# Patient Record
Sex: Male | Born: 1955 | Race: White | Hispanic: No | Marital: Single | Smoking: Former smoker
Health system: Southern US, Community
[De-identification: ages and names within clinical notes are randomized; demographics above are authoritative.]

## PROBLEM LIST (undated history)

## (undated) DIAGNOSIS — I2510 Atherosclerotic heart disease of native coronary artery without angina pectoris: Secondary | ICD-10-CM

## (undated) DIAGNOSIS — J449 Chronic obstructive pulmonary disease, unspecified: Secondary | ICD-10-CM

## (undated) DIAGNOSIS — I472 Ventricular tachycardia: Secondary | ICD-10-CM

## (undated) DIAGNOSIS — Z7901 Long term (current) use of anticoagulants: Secondary | ICD-10-CM

## (undated) DIAGNOSIS — E669 Obesity, unspecified: Secondary | ICD-10-CM

## (undated) DIAGNOSIS — I5022 Chronic systolic (congestive) heart failure: Secondary | ICD-10-CM

## (undated) DIAGNOSIS — K621 Rectal polyp: Secondary | ICD-10-CM

## (undated) DIAGNOSIS — I10 Essential (primary) hypertension: Secondary | ICD-10-CM

## (undated) DIAGNOSIS — I249 Acute ischemic heart disease, unspecified: Secondary | ICD-10-CM

## (undated) DIAGNOSIS — E785 Hyperlipidemia, unspecified: Secondary | ICD-10-CM

## (undated) DIAGNOSIS — R0602 Shortness of breath: Secondary | ICD-10-CM

## (undated) DIAGNOSIS — I4729 Other ventricular tachycardia: Secondary | ICD-10-CM

## (undated) DIAGNOSIS — K635 Polyp of colon: Secondary | ICD-10-CM

## (undated) DIAGNOSIS — K921 Melena: Secondary | ICD-10-CM

## (undated) DIAGNOSIS — M1990 Unspecified osteoarthritis, unspecified site: Secondary | ICD-10-CM

## (undated) DIAGNOSIS — I4891 Unspecified atrial fibrillation: Secondary | ICD-10-CM

## (undated) DIAGNOSIS — I499 Cardiac arrhythmia, unspecified: Secondary | ICD-10-CM

## (undated) HISTORY — DX: Rectal polyp: K62.1

## (undated) HISTORY — DX: Essential (primary) hypertension: I10

## (undated) HISTORY — DX: Other ventricular tachycardia: I47.29

## (undated) HISTORY — PX: FOOT SURGERY: SHX648

## (undated) HISTORY — DX: Acute ischemic heart disease, unspecified: I24.9

## (undated) HISTORY — DX: Atherosclerotic heart disease of native coronary artery without angina pectoris: I25.10

## (undated) HISTORY — DX: Obesity, unspecified: E66.9

## (undated) HISTORY — DX: Melena: K92.1

## (undated) HISTORY — DX: Unspecified atrial fibrillation: I48.91

## (undated) HISTORY — DX: Long term (current) use of anticoagulants: Z79.01

## (undated) HISTORY — DX: Polyp of colon: K63.5

## (undated) HISTORY — DX: Hyperlipidemia, unspecified: E78.5

## (undated) HISTORY — DX: Ventricular tachycardia: I47.2

---

## 2002-02-23 ENCOUNTER — Emergency Department (HOSPITAL_COMMUNITY): Admission: EM | Admit: 2002-02-23 | Discharge: 2002-02-23 | Payer: Self-pay | Admitting: Emergency Medicine

## 2002-02-23 ENCOUNTER — Encounter: Payer: Self-pay | Admitting: Emergency Medicine

## 2002-08-14 ENCOUNTER — Ambulatory Visit (HOSPITAL_BASED_OUTPATIENT_CLINIC_OR_DEPARTMENT_OTHER): Admission: RE | Admit: 2002-08-14 | Discharge: 2002-08-14 | Payer: Self-pay | Admitting: Orthopedic Surgery

## 2002-11-14 ENCOUNTER — Encounter: Admission: RE | Admit: 2002-11-14 | Discharge: 2003-02-12 | Payer: Self-pay | Admitting: Orthopedic Surgery

## 2003-02-11 ENCOUNTER — Ambulatory Visit (HOSPITAL_COMMUNITY): Admission: RE | Admit: 2003-02-11 | Discharge: 2003-02-11 | Payer: Self-pay | Admitting: Internal Medicine

## 2003-12-11 ENCOUNTER — Ambulatory Visit: Payer: Self-pay | Admitting: Nurse Practitioner

## 2004-02-06 ENCOUNTER — Encounter: Admission: RE | Admit: 2004-02-06 | Discharge: 2004-05-06 | Payer: Self-pay | Admitting: Internal Medicine

## 2004-05-07 ENCOUNTER — Encounter: Admission: RE | Admit: 2004-05-07 | Discharge: 2004-08-05 | Payer: Self-pay | Admitting: Internal Medicine

## 2004-12-03 ENCOUNTER — Ambulatory Visit: Payer: Self-pay | Admitting: Nurse Practitioner

## 2005-07-16 ENCOUNTER — Ambulatory Visit: Payer: Self-pay | Admitting: Nurse Practitioner

## 2005-10-25 ENCOUNTER — Ambulatory Visit: Payer: Self-pay | Admitting: Nurse Practitioner

## 2007-03-16 ENCOUNTER — Emergency Department (HOSPITAL_COMMUNITY): Admission: EM | Admit: 2007-03-16 | Discharge: 2007-03-16 | Payer: Self-pay | Admitting: Emergency Medicine

## 2007-03-21 ENCOUNTER — Ambulatory Visit: Payer: Self-pay | Admitting: Family Medicine

## 2007-03-21 ENCOUNTER — Encounter (INDEPENDENT_AMBULATORY_CARE_PROVIDER_SITE_OTHER): Payer: Self-pay | Admitting: Nurse Practitioner

## 2007-03-21 LAB — CONVERTED CEMR LAB
ALT: 30 units/L (ref 0–53)
AST: 20 units/L (ref 0–37)
Albumin: 4.4 g/dL (ref 3.5–5.2)
Basophils Absolute: 0 10*3/uL (ref 0.0–0.1)
Basophils Relative: 0 % (ref 0–1)
CO2: 22 meq/L (ref 19–32)
Chloride: 99 meq/L (ref 96–112)
Eosinophils Relative: 2 % (ref 0–5)
Hemoglobin: 16.2 g/dL (ref 13.0–17.0)
Lymphocytes Relative: 31 % (ref 12–46)
MCHC: 32.9 g/dL (ref 30.0–36.0)
MCV: 94.8 fL (ref 78.0–100.0)
Monocytes Absolute: 0.7 10*3/uL (ref 0.1–1.0)
Neutrophils Relative %: 57 % (ref 43–77)
PSA: 1.52 ng/mL (ref 0.10–4.00)
Platelets: 214 10*3/uL (ref 150–400)
TSH: 3.993 microintl units/mL (ref 0.350–5.50)
Total Bilirubin: 0.6 mg/dL (ref 0.3–1.2)
Total Protein: 7.4 g/dL (ref 6.0–8.3)
WBC: 6.7 10*3/uL (ref 4.0–10.5)

## 2007-04-19 ENCOUNTER — Ambulatory Visit: Payer: Self-pay | Admitting: Nurse Practitioner

## 2007-04-19 LAB — CONVERTED CEMR LAB
HDL: 47 mg/dL (ref >39)
LDL Cholesterol: 158 mg/dL — ABNORMAL HIGH (ref 0–99)
Total CHOL/HDL Ratio: 5.2

## 2007-07-12 ENCOUNTER — Ambulatory Visit (HOSPITAL_COMMUNITY): Admission: RE | Admit: 2007-07-12 | Discharge: 2007-07-13 | Payer: Self-pay | Admitting: Orthopedic Surgery

## 2007-08-16 ENCOUNTER — Encounter: Admission: RE | Admit: 2007-08-16 | Discharge: 2007-11-14 | Payer: Self-pay | Admitting: Orthopedic Surgery

## 2007-11-16 ENCOUNTER — Encounter: Admission: RE | Admit: 2007-11-16 | Discharge: 2007-12-12 | Payer: Self-pay | Admitting: Orthopedic Surgery

## 2008-01-26 HISTORY — PX: CORONARY ANGIOPLASTY WITH STENT PLACEMENT: SHX49

## 2008-09-18 ENCOUNTER — Encounter: Admission: RE | Admit: 2008-09-18 | Discharge: 2008-09-18 | Payer: Self-pay | Admitting: Orthopedic Surgery

## 2008-11-01 ENCOUNTER — Ambulatory Visit: Payer: Self-pay | Admitting: Internal Medicine

## 2008-11-01 ENCOUNTER — Inpatient Hospital Stay (HOSPITAL_COMMUNITY): Admission: EM | Admit: 2008-11-01 | Discharge: 2008-11-14 | Payer: Self-pay | Admitting: Emergency Medicine

## 2008-11-02 ENCOUNTER — Encounter: Payer: Self-pay | Admitting: Internal Medicine

## 2008-11-04 ENCOUNTER — Encounter (INDEPENDENT_AMBULATORY_CARE_PROVIDER_SITE_OTHER): Payer: Self-pay | Admitting: Internal Medicine

## 2008-11-05 ENCOUNTER — Encounter (INDEPENDENT_AMBULATORY_CARE_PROVIDER_SITE_OTHER): Payer: Self-pay | Admitting: Internal Medicine

## 2008-11-08 ENCOUNTER — Encounter: Payer: Self-pay | Admitting: Cardiology

## 2009-04-10 ENCOUNTER — Encounter: Admission: RE | Admit: 2009-04-10 | Discharge: 2009-04-10 | Payer: Self-pay | Admitting: Orthopedic Surgery

## 2010-04-30 LAB — BASIC METABOLIC PANEL
BUN: 18 mg/dL (ref 6–23)
BUN: 19 mg/dL (ref 6–23)
BUN: 19 mg/dL (ref 6–23)
BUN: 21 mg/dL (ref 6–23)
BUN: 10 mg/dL (ref 6–23)
BUN: 8 mg/dL (ref 6–23)
BUN: 14 mg/dL (ref 6–23)
BUN: 17 mg/dL (ref 6–23)
CO2: 26 mEq/L (ref 19–32)
CO2: 30 mEq/L (ref 19–32)
CO2: 34 mEq/L — ABNORMAL HIGH (ref 19–32)
CO2: 34 mEq/L — ABNORMAL HIGH (ref 19–32)
CO2: 25 mEq/L (ref 19–32)
Calcium: 8.9 mg/dL (ref 8.4–10.5)
Calcium: 9.2 mg/dL (ref 8.4–10.5)
Calcium: 9.7 mg/dL (ref 8.4–10.5)
Calcium: 9.8 mg/dL (ref 8.4–10.5)
Calcium: 8.9 mg/dL (ref 8.4–10.5)
Calcium: 9.7 mg/dL (ref 8.4–10.5)
Chloride: 101 mEq/L (ref 96–112)
Chloride: 104 mEq/L (ref 96–112)
Chloride: 104 mEq/L (ref 96–112)
Chloride: 100 mEq/L (ref 96–112)
Chloride: 104 mEq/L (ref 96–112)
Creatinine, Ser: 1.08 mg/dL (ref 0.4–1.5)
Creatinine, Ser: 1.21 mg/dL (ref 0.4–1.5)
Creatinine, Ser: 1.35 mg/dL (ref 0.4–1.5)
Creatinine, Ser: 1.09 mg/dL (ref 0.4–1.5)
Creatinine, Ser: 1.1 mg/dL (ref 0.4–1.5)
GFR calc Af Amer: >60 mL/min (ref >60)
GFR calc Af Amer: >60 mL/min (ref >60)
GFR calc Af Amer: >60 mL/min (ref >60)
GFR calc non Af Amer: >60 mL/min (ref >60)
GFR calc non Af Amer: >60 mL/min (ref >60)
GFR calc non Af Amer: >60 mL/min (ref >60)
GFR calc non Af Amer: >60 mL/min (ref >60)
GFR calc non Af Amer: >60 mL/min (ref >60)
Glucose, Bld: 93 mg/dL (ref 70–99)
Glucose, Bld: 98 mg/dL (ref 70–99)
Glucose, Bld: 99 mg/dL (ref 70–99)
Glucose, Bld: 150 mg/dL — ABNORMAL HIGH (ref 70–99)
Glucose, Bld: 106 mg/dL — ABNORMAL HIGH (ref 70–99)
Glucose, Bld: 122 mg/dL — ABNORMAL HIGH (ref 70–99)
Potassium: 4 mEq/L (ref 3.5–5.1)
Potassium: 4.3 mEq/L (ref 3.5–5.1)
Potassium: 3.8 mEq/L (ref 3.5–5.1)
Potassium: 3.7 mEq/L (ref 3.5–5.1)
Potassium: 4.2 mEq/L (ref 3.5–5.1)
Potassium: 4.2 mEq/L (ref 3.5–5.1)
Sodium: 138 mEq/L (ref 135–145)
Sodium: 140 mEq/L (ref 135–145)

## 2010-04-30 LAB — DIFFERENTIAL
Basophils Absolute: 0 10*3/uL (ref 0.0–0.1)
Basophils Relative: 1 % (ref 0–1)
Eosinophils Absolute: 0 K/uL (ref 0.0–0.7)
Eosinophils Relative: 0 % (ref 0–5)
Lymphocytes Relative: 19 % (ref 12–46)
Lymphs Abs: 1.9 K/uL (ref 0.7–4.0)
Monocytes Absolute: 0.8 K/uL (ref 0.1–1.0)
Monocytes Relative: 8 % (ref 3–12)
Neutro Abs: 7.5 10*3/uL (ref 1.7–7.7)
Neutrophils Relative %: 73 % (ref 43–77)

## 2010-04-30 LAB — D-DIMER, QUANTITATIVE: D-Dimer, Quant: 1.32 ug{FEU}/mL — ABNORMAL HIGH (ref 0.00–0.48)

## 2010-04-30 LAB — POCT CARDIAC MARKERS: CKMB, poc: 2.9 ng/mL (ref 1.0–8.0)

## 2010-04-30 LAB — CBC
HCT: 50.4 % (ref 39.0–52.0)
HCT: 50.9 % (ref 39.0–52.0)
HCT: 51 % (ref 39.0–52.0)
HCT: 48.6 % (ref 39.0–52.0)
HCT: 50 % (ref 39.0–52.0)
HCT: 50.2 % (ref 39.0–52.0)
HCT: 44.8 % (ref 39.0–52.0)
HCT: 50.3 % (ref 39.0–52.0)
HCT: 51.9 % (ref 39.0–52.0)
Hemoglobin: 16.7 g/dL (ref 13.0–17.0)
MCHC: 33.3 g/dL (ref 30.0–36.0)
MCHC: 33.8 g/dL (ref 30.0–36.0)
MCHC: 34 g/dL (ref 30.0–36.0)
MCV: 94 fL (ref 78.0–100.0)
MCV: 95.4 fL (ref 78.0–100.0)
MCV: 95.8 fL (ref 78.0–100.0)
MCV: 94.2 fL (ref 78.0–100.0)
MCV: 94.6 fL (ref 78.0–100.0)
MCV: 95.1 fL (ref 78.0–100.0)
MCV: 96.2 fL (ref 78.0–100.0)
MCV: 95.6 fL (ref 78.0–100.0)
MCV: 95.7 fL (ref 78.0–100.0)
Platelets: 227 10*3/uL (ref 150–400)
Platelets: 250 10*3/uL (ref 150–400)
Platelets: 181 10*3/uL (ref 150–400)
Platelets: 189 10*3/uL (ref 150–400)
Platelets: 196 10*3/uL (ref 150–400)
Platelets: 227 10*3/uL (ref 150–400)
Platelets: 205 10*3/uL (ref 150–400)
Platelets: 219 10*3/uL (ref 150–400)
RBC: 5.29 MIL/uL (ref 4.22–5.81)
RBC: 5.32 MIL/uL (ref 4.22–5.81)
RBC: 5.35 MIL/uL (ref 4.22–5.81)
RBC: 5.14 MIL/uL (ref 4.22–5.81)
RBC: 5.21 MIL/uL (ref 4.22–5.81)
RBC: 4.69 MIL/uL (ref 4.22–5.81)
RDW: 13.4 % (ref 11.5–15.5)
RDW: 14.5 % (ref 11.5–15.5)
RDW: 14.1 % (ref 11.5–15.5)
RDW: 14.3 % (ref 11.5–15.5)
WBC: 9.1 10*3/uL (ref 4.0–10.5)
WBC: 10.2 10*3/uL (ref 4.0–10.5)
WBC: 8.1 10*3/uL (ref 4.0–10.5)
WBC: 9.7 10*3/uL (ref 4.0–10.5)
WBC: 7.5 10*3/uL (ref 4.0–10.5)

## 2010-04-30 LAB — URINE MICROSCOPIC-ADD ON

## 2010-04-30 LAB — BRAIN NATRIURETIC PEPTIDE
Pro B Natriuretic peptide (BNP): 192 pg/mL — ABNORMAL HIGH (ref 0.0–100.0)
Pro B Natriuretic peptide (BNP): 147 pg/mL — ABNORMAL HIGH (ref 0.0–100.0)
Pro B Natriuretic peptide (BNP): 328 pg/mL — ABNORMAL HIGH (ref 0.0–100.0)

## 2010-04-30 LAB — HEPARIN LEVEL (UNFRACTIONATED)
Heparin Unfractionated: 0.61 IU/mL (ref 0.30–0.70)
Heparin Unfractionated: 0.95 IU/mL — ABNORMAL HIGH (ref 0.30–0.70)
Heparin Unfractionated: <0.1 IU/mL — ABNORMAL LOW (ref 0.30–0.70)

## 2010-04-30 LAB — COMPREHENSIVE METABOLIC PANEL
ALT: 31 U/L (ref 0–53)
AST: 24 U/L (ref 0–37)
Albumin: 3.7 g/dL (ref 3.5–5.2)
Alkaline Phosphatase: 62 U/L (ref 39–117)
CO2: 24 mEq/L (ref 19–32)
Chloride: 104 mEq/L (ref 96–112)
Creatinine, Ser: 1.11 mg/dL (ref 0.4–1.5)
Total Bilirubin: 1.7 mg/dL — ABNORMAL HIGH (ref 0.3–1.2)

## 2010-04-30 LAB — URINALYSIS, ROUTINE W REFLEX MICROSCOPIC
Bilirubin Urine: NEGATIVE
Glucose, UA: 250 mg/dL — AB
Ketones, ur: NEGATIVE mg/dL
Nitrite: NEGATIVE
Protein, ur: 100 mg/dL — AB
Specific Gravity, Urine: 1.016 (ref 1.005–1.030)
Urobilinogen, UA: 0.2 mg/dL (ref 0.0–1.0)
pH: 7.5 (ref 5.0–8.0)

## 2010-04-30 LAB — CARDIAC PANEL(CRET KIN+CKTOT+MB+TROPI)
CK, MB: 3.6 ng/mL (ref 0.3–4.0)
Relative Index: INVALID (ref 0.0–2.5)
Relative Index: INVALID (ref 0.0–2.5)
Total CK: 44 U/L (ref 7–232)
Troponin I: 0.04 ng/mL (ref 0.00–0.06)
Troponin I: 0.04 ng/mL (ref 0.00–0.06)

## 2010-04-30 LAB — SEDIMENTATION RATE: Sed Rate: 0 mm/hr (ref 0–16)

## 2010-04-30 LAB — PROTIME-INR
INR: 1.73 — ABNORMAL HIGH (ref 0.00–1.49)
INR: 1.92 — ABNORMAL HIGH (ref 0.00–1.49)
INR: 1.19 (ref 0.00–1.49)
Prothrombin Time: 15.5 seconds — ABNORMAL HIGH (ref 11.6–15.2)
Prothrombin Time: 20.1 seconds — ABNORMAL HIGH (ref 11.6–15.2)
Prothrombin Time: 15 seconds (ref 11.6–15.2)

## 2010-04-30 LAB — POCT I-STAT 3, VENOUS BLOOD GAS (G3P V)
O2 Saturation: 61 %
TCO2: 24 mmol/L (ref 0–100)
pH, Ven: 7.318 — ABNORMAL HIGH (ref 7.250–7.300)

## 2010-04-30 LAB — COMPREHENSIVE METABOLIC PANEL WITH GFR
BUN: 10 mg/dL (ref 6–23)
Calcium: 9.1 mg/dL (ref 8.4–10.5)
GFR calc Af Amer: >60 mL/min (ref >60)
GFR calc non Af Amer: >60 mL/min (ref >60)
Glucose, Bld: 113 mg/dL — ABNORMAL HIGH (ref 70–99)
Potassium: 4.3 meq/L (ref 3.5–5.1)
Sodium: 137 meq/L (ref 135–145)
Total Protein: 6.5 g/dL (ref 6.0–8.3)

## 2010-04-30 LAB — POCT I-STAT 3, ART BLOOD GAS (G3+)
Bicarbonate: 25.9 mEq/L — ABNORMAL HIGH (ref 20.0–24.0)
pH, Arterial: 7.386 (ref 7.350–7.450)

## 2010-04-30 LAB — LIPID PANEL
Cholesterol: 146 mg/dL (ref 0–200)
LDL Cholesterol: 96 mg/dL (ref 0–99)

## 2010-04-30 LAB — CEA: CEA: 1.1 ng/mL (ref 0.0–5.0)

## 2010-04-30 LAB — APTT: aPTT: 30 seconds (ref 24–37)

## 2010-04-30 LAB — HEMOGLOBIN A1C: Hgb A1c MFr Bld: 5.5 % (ref 4.6–6.1)

## 2010-04-30 LAB — CK TOTAL AND CKMB (NOT AT ARMC): Relative Index: INVALID (ref 0.0–2.5)

## 2010-06-09 NOTE — Op Note (Signed)
NAME:  Norman Rodriguez, Norman Rodriguez             ACCOUNT NO.:  [id number]   MEDICAL RECORD NO.:  [id number]          PATIENT TYPE:  AMB   LOCATION:  DAY                          FACILITY:  [facility]   PHYSICIAN:  Georges Lynch. Gioffre, M.D.DATE OF BIRTH:  04-Oct-1955   DATE OF PROCEDURE:  07/12/2007  DATE OF DISCHARGE:                               OPERATIVE REPORT   SURGEON:  Georges Lynch. Darrelyn Hillock, M.D.   ASSISTANT:  Jamelle Rushing, P.A.   PREOPERATIVE DIAGNOSES:  1. Severe impingement syndrome left shoulder.  2. Partial tear of the rotator cuff tendon of the left shoulder.   POSTOPERATIVE DIAGNOSES:  1. Severe impingement syndrome left shoulder.  2. Partial tear of the rotator cuff tendon of the left shoulder.   OPERATIONS:  1. Open acromionectomy and acromioplasty left shoulder.  2. Repair of tear of the rotator cuff tendon utilizing a Restore      graft, with a PEEK anchor with four separate sutures.   PROCEDURE:  Under general anesthesia, routine orthopedic prep and  draping of the left shoulder was carried out.  The patient was not given  an interscalene nerve block because of numbness in his fingers preop.  At this time, an incision was made over the anterior aspect of the left  shoulder.  Bleeders were identified and cauterized.  I stripped the  deltoid tendon by sharp dissection from the acromion.  I then separated  the proximal portion of the deltoid muscle.  Immediately upon going down  into the shoulder, I removed a very thickened, irritated subdeltoid  bursa.  I also noted at that time there was literally no room for  shoulder abduction because of the severe downsloping and thickening of  his acromion.  I then protected the subacromial space with a Cytogeneticist.  I utilized the oscillating saw on the bur to even out the  undersurface of the acromion.  Once we reestablished the subacromial  space, I thoroughly irrigated out the area, cauterized the bleeders, and  then inserted some  thrombin-soaked Gelfoam proximally up under the  acromion.  I then utilized a Restore patch and sutured that in place to  reinforce the rotator cuff tendon.  I used one PEEK anchor with four  separate sutures to anchor the distal part of the graft.  The wound was  irrigated, and then the deltoid tendon muscle was reapproximated in the  usual fashion.  The subcutaneous was closed with 0 Vicryl, skin with  metal staples.  Sterile Neosporin dressing was applied.  Note, I did  inject 10 mL of 0.5% Marcaine with epinephrine into the shoulder before  closing the shoulder.  The patient then was placed in a shoulder  immobilizer.           ______________________________  Georges Lynch Darrelyn Hillock, M.D.    RAG/MEDQ  D:  07/12/2007  T:  07/12/2007  Job:  [id number]

## 2010-06-12 NOTE — Op Note (Signed)
NAME:  Norman Rodriguez, Norman Rodriguez                       ACCOUNT NO.:  [id number]   MEDICAL RECORD NO.:  [id number]                   PATIENT TYPE:  AMB   LOCATION:  DSC                                  FACILITY:  [facility]   PHYSICIAN:  Leonides Grills, M.D.                  DATE OF BIRTH:  03/05/1955   DATE OF PROCEDURE:  08/14/2002  DATE OF DISCHARGE:                                 OPERATIVE REPORT   PREOPERATIVE DIAGNOSES:  Right distal tibial-fibular syndesmotic rupture,  chronic neuropathic bimalleolar ankle fracture.   POSTOPERATIVE DIAGNOSES:  Right distal tibial-fibular syndesmotic rupture,  chronic neuropathic bimalleolar ankle fracture.   OPERATION PERFORMED:  1. Open reduction internal fixation of right chronic neuropathic bimalleolar     ankle fracture.  2. Open reduction internal fixation of right distal tibial-fibular     syndesmotic rupture.  3. Right stress x-rays, right ankle.   SURGEON:  Leonides Grills, M.D.   ASSISTANT:  None.   ANESTHESIA:  General endotracheal tube.   ESTIMATED BLOOD LOSS:  Minimal.   TOURNIQUET TIME:  Two hours.   COMPLICATIONS:  None.   DISPOSITION:  Stable to PR.   INDICATIONS FOR PROCEDURE:  The patient is a 55 year old male who has  noticed swelling and deformity in his ankle approximately six weeks ago.  He  has no history of trauma.  He then presented to Dr. Dietrich Pates office and x-  rays were obtained.  He was found to have a pronation type bimalleolar ankle  fracture with lateral subluxation of the talus within the ankle mortise and  diastasis of the distal tib-fib syndesmosis.  He was walking on it for  probably five to six weeks or if anything, longer.  The patient  has  consented for the above procedure.  All risks which include infection,  neurovascular injury, nonunion, malunion, hardware irrigation, hardware  failure, Charcot arthropathy, osteomyelitis, failure of fixation, and  possible amputation were all explained, questions were  encouraged and  answered.   DESCRIPTION OF PROCEDURE:  The patient was brought to the operating room and  placed in supine position after adequate general endotracheal tube  anesthesia was administered as well as Ancef 1g IV piggyback.  The right  lower extremity was then prepped and draped in sterile manner over a  proximally placed thigh tourniquet.  The limb was gravity exsanguinated.  The tourniquet was elevated to 290 mmHg.  A longitudinal incision over the  lateral malleolus was then made.  Dissection was carried down to bone.  The  fracture site was then encountered and this was verified under C-arm  guidance.  After osteotomizing the previous fracture site with a curved  quarter inch osteotome and mallet releasing the syndesmosis all the way down  to the joint with a curved elevator, we made a longitudinal incision over  the medial malleolus.  Dissection was carried down to bone.  The fracture  site was  then identified and with a curved quarter inch osteotome, this was  then developed.  This was in multiple fragments including multiple fragments  that were extremely soft within the anterior distal aspect of the medial  malleolus.  Due to the poor quality of the fragments and being loose and  intra-articular, this was then removed.  We then entered the joint, opened  the fracture site and cleaned out any of the hematoma or scar tissue within  the medial gutter.  The osteotomy was then anatomically reduced under C-arm  guidance and two 3.5 mm fully threaded cortical screws were then placed as a  set screw with a two-point reduction clamp used as tension across the  osteotomy site.  We then applied a four-hole one third tubular plate that  was prebent in an anti-glide manner.  Once this was done and two 3.5 mm  fully threaded cortical screws were placed above it, through the distal most  hole of the plate, a lag screws was then placed and this compressed the  fracture even more.  The  deltoid ligament was loose; however, there was no  areas where there was clear demarcation of a tear.  We then went back to the  lateral aspect of the ankle.  The osteotomy was then reduced anatomically  and a clamp was then applied to hold the reduction in anatomic position.  This was verified under C-arm guidance with the verified [place] line and  dime sign and rotation was anatomic as well.  We then applied a stacked 8-  hole and 7-hole one third tubular plate.  Reason why we did not use two 8-  holes is we did not have another 8-hole plate.  We then applied a King  Tong's clamp with the ankle in full dorsiflexion and applied all 4.5 mm  fully threaded cortical screws across in a four-cortices manner using a 3-2  drill respectively.  This held the anatomic reduction of not only the  fracture site but also the distal tib-fib syndesmotic rupture.  Stress x-  rays were done to verify that the ankle was stable.  The syndesmosis was  anatomically reduced.  This was verified directly and the fibula was out to  length.  Once this was done, we copiously irrigated the wound with normal  saline bilaterally.  Subcu was closed with 2-0 Vicryl.  Skin was closed with  4-0 nylon after the tourniquet was deflated and hemostasis was obtained.  Jones dressing was applied with the ankle in neutral dorsiflexion.  The  patient was stable to the PR.   The wound was then copiously irrigated with normal saline.  The skin was  closed with 4-0 nylon.  Sterile dressing was applied.  The patient was  stable to the PR.                                               Leonides Grills, M.D.    PB/MEDQ  D:  08/14/2002  T:  08/14/2002  Job:  [id number]

## 2010-08-01 IMAGING — CR DG CHEST 2V
2 series · 2 of 2 positions shown · non-contrast
Comparison: Two-view chest x-ray 11/04/2008 and 11/01/2008.  CT
angio chest and [DATE].

CLINICAL DATA: Chest pain.  Coronary artery stenting 2 days ago.
Follow up pleural effusions.

CHEST - 2 VIEW 11/08/2008:

[w chest pa]
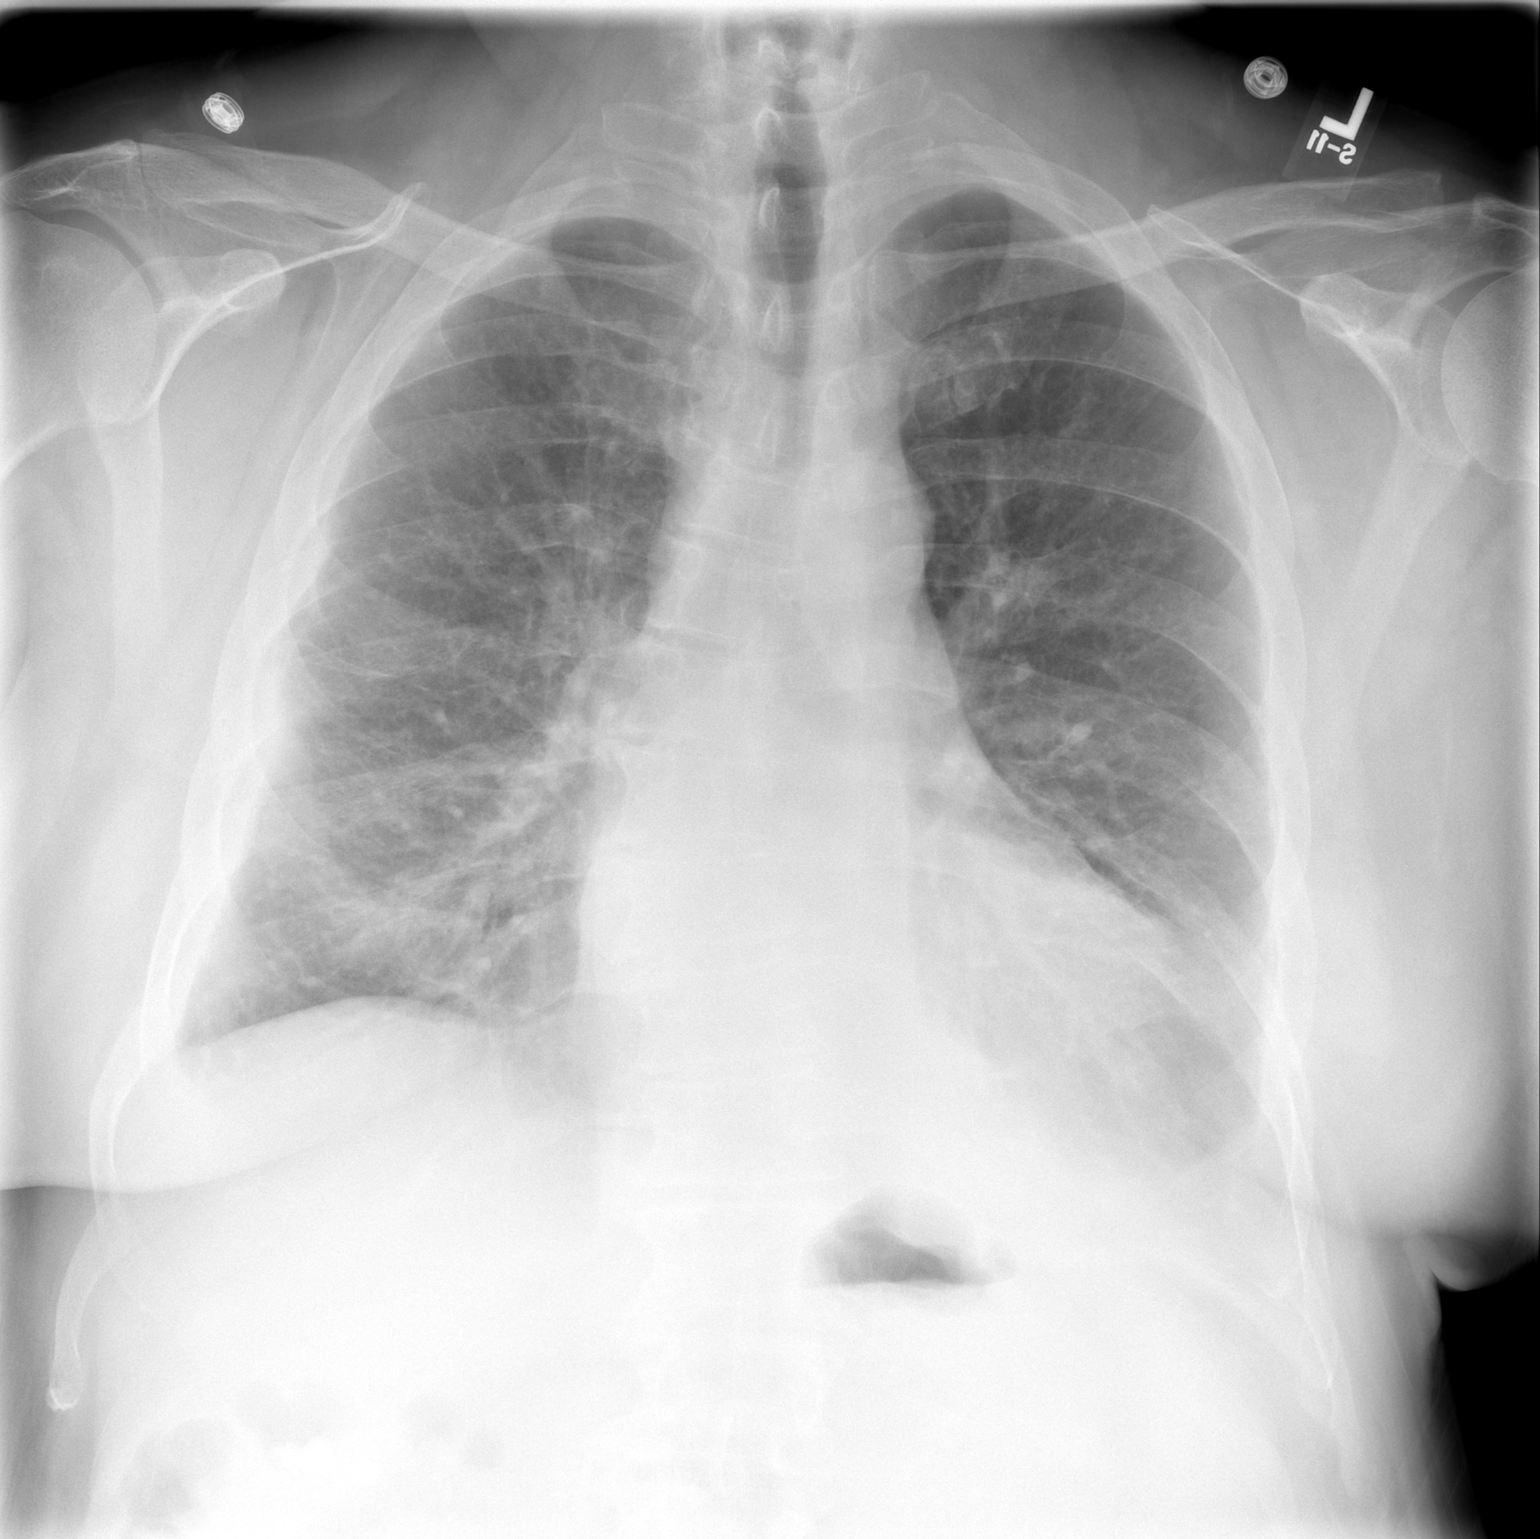

[w chest lat]
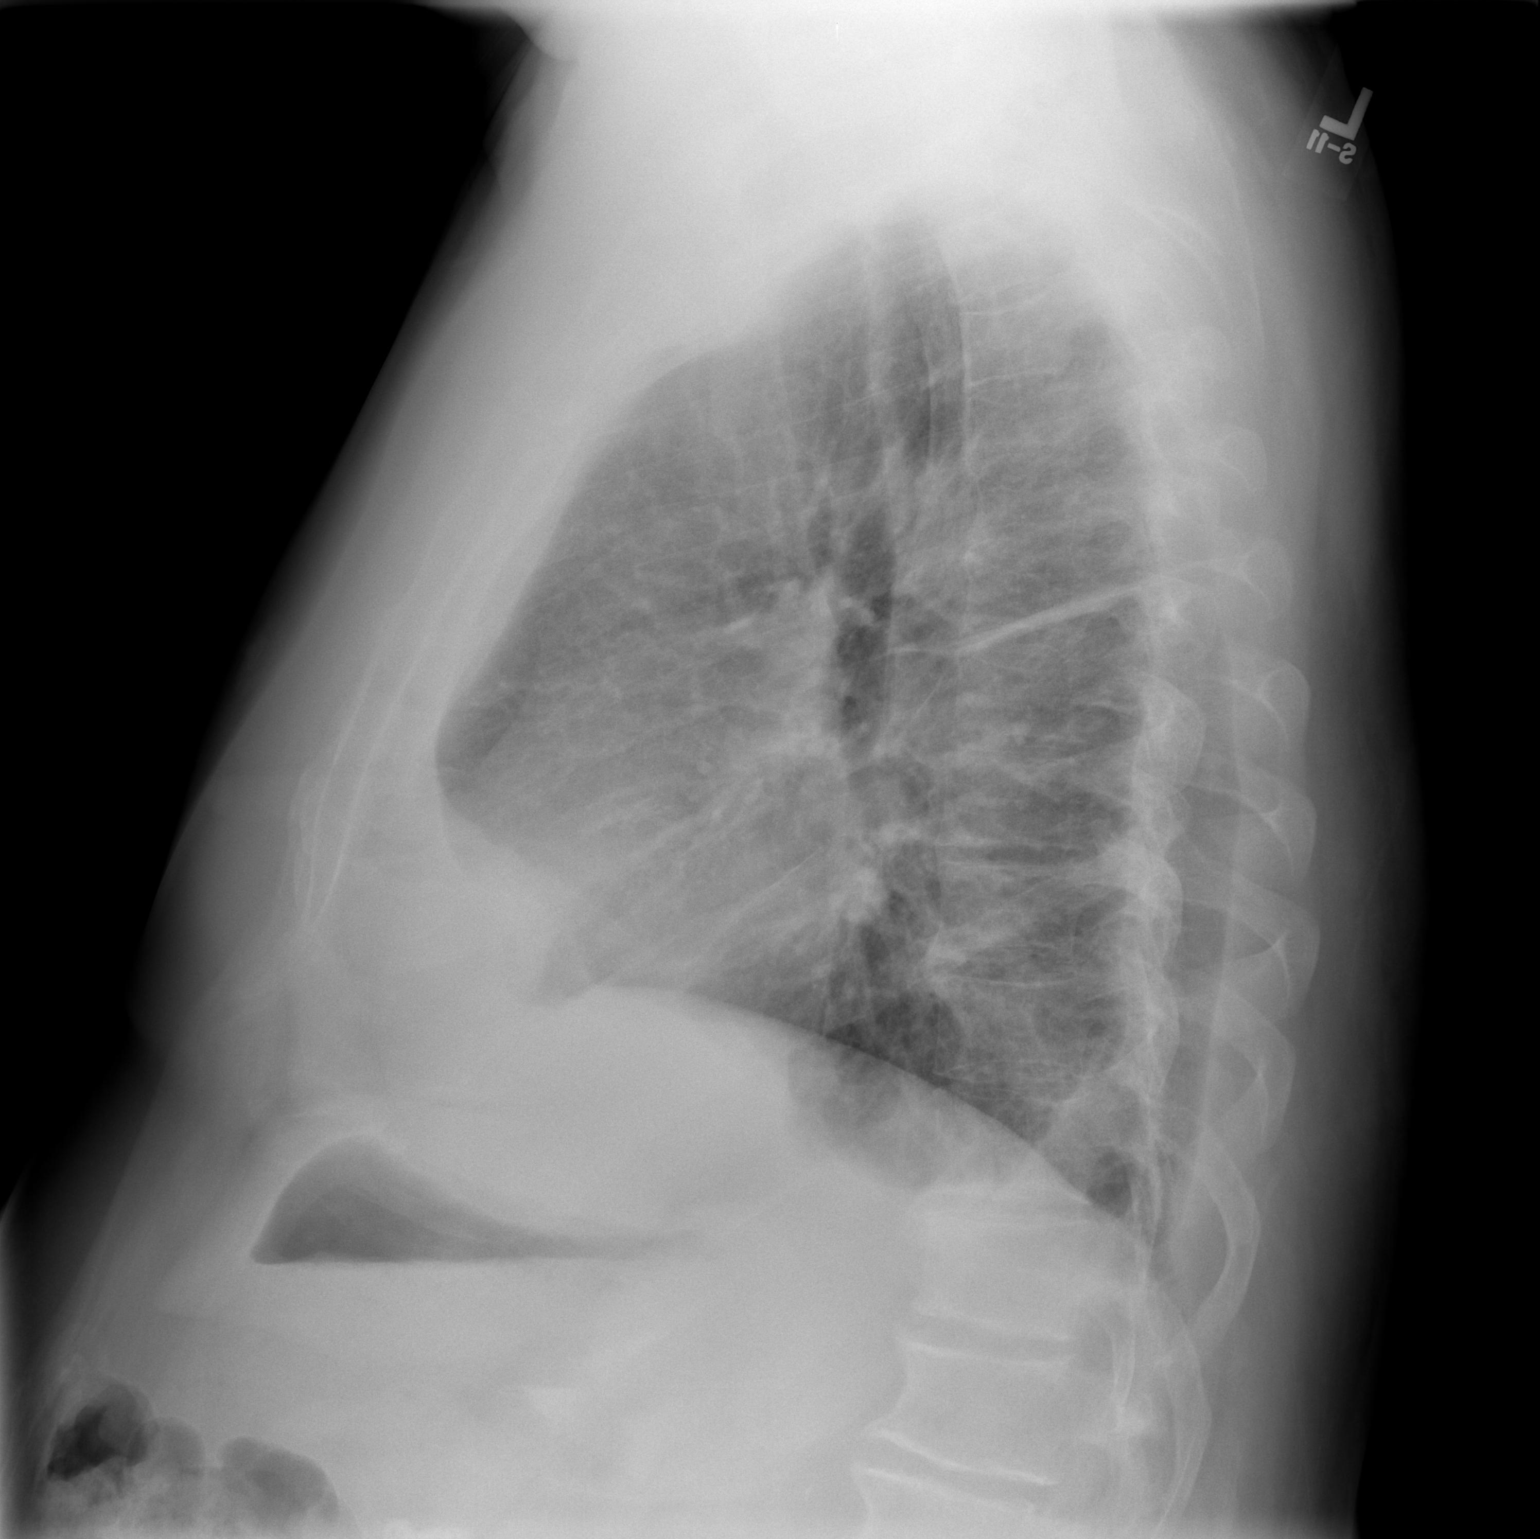

[2 of 2 positions shown; findings below may reference images not displayed]

FINDINGS: Interval decrease in size of bilateral pleural effusions,
that on the right shown to be loculated on the recent CT.  Improved
aeration in the lower lobes, with only mild left lower lobe
atelectasis persisting.  Lungs otherwise clear.  Pulmonary
vascularity normal without evidence of pulmonary edema.  Heart
mildly enlarged but stable.  Hilar and mediastinal contours
otherwise unremarkable.  Degenerative changes again noted
throughout the thoracic spine.
IMPRESSION: Improving bilateral pleural effusions.  Improved aeration in the
lower lobes with only mild left lower lobe atelectasis persisting.
No new abnormalities.

## 2010-10-19 LAB — CBC
HCT: 46.9
Hemoglobin: 16.5
MCHC: 35.3
MCV: 91.2
Platelets: 195
RBC: 5.14
RDW: 13
WBC: 6

## 2010-10-19 LAB — COMPREHENSIVE METABOLIC PANEL
AST: 28
Albumin: 4
BUN: 9
CO2: 29
Calcium: 9.7
Chloride: 105
Creatinine, Ser: 0.93
GFR calc Af Amer: >60
GFR calc non Af Amer: >60
Total Bilirubin: 0.9

## 2010-10-19 LAB — DIFFERENTIAL
Basophils Absolute: 0
Basophils Relative: 0
Eosinophils Absolute: 0.1
Eosinophils Relative: 1
Lymphocytes Relative: 33
Lymphs Abs: 2
Monocytes Absolute: 0.4
Monocytes Relative: 8
Neutro Abs: 3.5
Neutrophils Relative %: 58

## 2010-10-19 LAB — URINALYSIS, ROUTINE W REFLEX MICROSCOPIC
Bilirubin Urine: NEGATIVE
Hgb urine dipstick: NEGATIVE
Ketones, ur: NEGATIVE
Nitrite: NEGATIVE
Urobilinogen, UA: 0.2

## 2010-10-19 LAB — COMPREHENSIVE METABOLIC PANEL WITH GFR
ALT: 37
Alkaline Phosphatase: 57
Glucose, Bld: 89
Potassium: 4.3
Sodium: 142
Total Protein: 7.3

## 2010-10-19 LAB — CK: Total CK: 106

## 2010-10-19 LAB — SAMPLE TO BLOOD BANK

## 2010-10-19 LAB — LIPASE, BLOOD: Lipase: 30

## 2010-10-22 LAB — CBC
HCT: 47.9
Hemoglobin: 16.8
MCHC: 35.2
MCV: 94.1
RDW: 14.1

## 2010-10-22 LAB — DIFFERENTIAL
Basophils Relative: 0
Lymphs Abs: 2.7
Monocytes Relative: 8
Neutro Abs: 5.5
Neutrophils Relative %: 60

## 2010-10-22 LAB — URINALYSIS, ROUTINE W REFLEX MICROSCOPIC
Bilirubin Urine: NEGATIVE
Glucose, UA: NEGATIVE
Ketones, ur: NEGATIVE
pH: 5

## 2010-10-22 LAB — TYPE AND SCREEN

## 2010-10-22 LAB — COMPREHENSIVE METABOLIC PANEL
BUN: 13
Calcium: 9.9
Glucose, Bld: 110 — ABNORMAL HIGH
Total Protein: 7

## 2010-10-22 LAB — APTT: aPTT: 29

## 2010-10-22 LAB — PROTIME-INR
INR: 0.9
Prothrombin Time: 12.5

## 2012-10-25 ENCOUNTER — Telehealth: Payer: Self-pay | Admitting: Cardiology

## 2012-10-25 NOTE — Telephone Encounter (Signed)
New problem    Pt stated he is returning a call from Amy

## 2012-10-26 NOTE — Telephone Encounter (Signed)
RTND CALL TO PT, LM TO CALL BACK

## 2012-10-26 NOTE — Telephone Encounter (Signed)
RTND PT CALL, LM TO CALL BACK

## 2012-11-03 ENCOUNTER — Ambulatory Visit (INDEPENDENT_AMBULATORY_CARE_PROVIDER_SITE_OTHER): Payer: Medicare Other | Admitting: Pharmacist

## 2012-11-03 DIAGNOSIS — I4891 Unspecified atrial fibrillation: Secondary | ICD-10-CM | POA: Insufficient documentation

## 2012-11-03 NOTE — Telephone Encounter (Signed)
New Problem  Pt called.[name] Upset.. Attempting to deliver a blood reading.. Call was disconnected.. Called pt back// LVM to call bac

## 2012-11-23 ENCOUNTER — Telehealth: Payer: Self-pay

## 2012-11-24 MED ORDER — CARVEDILOL 25 MG PO TABS: 25.0000 mg | ORAL_TABLET | Freq: Two times a day (BID) | ORAL | Status: DC

## 2012-11-24 NOTE — Telephone Encounter (Signed)
Rx refill

## 2012-11-27 ENCOUNTER — Telehealth: Payer: Self-pay | Admitting: *Deleted

## 2012-11-27 MED ORDER — WARFARIN SODIUM 7.5 MG PO TABS: ORAL_TABLET | ORAL | Status: DC

## 2012-11-27 NOTE — Telephone Encounter (Signed)
Refill come in for warfarin refill, will route to J. Smart, Phar D

## 2012-11-27 NOTE — Telephone Encounter (Signed)
Patient up to date on protimes.  He checks it on home monitor.  Next INR due this week.  Refills sent in to [facility] for warfarin.

## 2012-11-29 ENCOUNTER — Ambulatory Visit (INDEPENDENT_AMBULATORY_CARE_PROVIDER_SITE_OTHER): Payer: Medicare Other | Admitting: Cardiovascular Disease

## 2012-11-29 DIAGNOSIS — I4891 Unspecified atrial fibrillation: Secondary | ICD-10-CM

## 2012-12-13 LAB — POCT INR: INR: 1.9

## 2012-12-15 ENCOUNTER — Ambulatory Visit (INDEPENDENT_AMBULATORY_CARE_PROVIDER_SITE_OTHER): Payer: Medicare Other | Admitting: Pharmacist

## 2012-12-15 DIAGNOSIS — I4891 Unspecified atrial fibrillation: Secondary | ICD-10-CM

## 2012-12-28 ENCOUNTER — Ambulatory Visit (INDEPENDENT_AMBULATORY_CARE_PROVIDER_SITE_OTHER): Payer: Medicare Other | Admitting: Pharmacist

## 2012-12-28 DIAGNOSIS — I4891 Unspecified atrial fibrillation: Secondary | ICD-10-CM

## 2013-01-03 ENCOUNTER — Telehealth: Payer: Self-pay | Admitting: *Deleted

## 2013-01-03 NOTE — Telephone Encounter (Signed)
Patient needs atorvastatin refill to be sent to [facility] in [place]. Thanks, MI

## 2013-01-04 ENCOUNTER — Other Ambulatory Visit: Payer: Self-pay | Admitting: Cardiology

## 2013-01-04 MED ORDER — ATORVASTATIN CALCIUM 40 MG PO TABS: 40.0000 mg | ORAL_TABLET | Freq: Every day | ORAL | Status: DC

## 2013-01-04 NOTE — Telephone Encounter (Signed)
Rx sent to pharmacy   

## 2013-01-10 ENCOUNTER — Ambulatory Visit (INDEPENDENT_AMBULATORY_CARE_PROVIDER_SITE_OTHER): Payer: Medicare Other | Admitting: Pharmacist

## 2013-01-10 DIAGNOSIS — I4891 Unspecified atrial fibrillation: Secondary | ICD-10-CM

## 2013-01-10 LAB — POCT INR: INR: 1.9

## 2013-01-22 ENCOUNTER — Encounter: Payer: Self-pay | Admitting: Cardiology

## 2013-01-22 DIAGNOSIS — I4729 Other ventricular tachycardia: Secondary | ICD-10-CM | POA: Insufficient documentation

## 2013-01-22 DIAGNOSIS — E785 Hyperlipidemia, unspecified: Secondary | ICD-10-CM | POA: Insufficient documentation

## 2013-01-22 DIAGNOSIS — I429 Cardiomyopathy, unspecified: Secondary | ICD-10-CM | POA: Insufficient documentation

## 2013-01-22 DIAGNOSIS — Z7901 Long term (current) use of anticoagulants: Secondary | ICD-10-CM | POA: Insufficient documentation

## 2013-01-22 DIAGNOSIS — I10 Essential (primary) hypertension: Secondary | ICD-10-CM | POA: Insufficient documentation

## 2013-01-22 DIAGNOSIS — I472 Ventricular tachycardia: Secondary | ICD-10-CM | POA: Insufficient documentation

## 2013-01-22 DIAGNOSIS — E669 Obesity, unspecified: Secondary | ICD-10-CM | POA: Insufficient documentation

## 2013-01-22 DIAGNOSIS — I2510 Atherosclerotic heart disease of native coronary artery without angina pectoris: Secondary | ICD-10-CM | POA: Insufficient documentation

## 2013-01-23 ENCOUNTER — Ambulatory Visit (INDEPENDENT_AMBULATORY_CARE_PROVIDER_SITE_OTHER): Payer: Medicare Other | Admitting: Pharmacist

## 2013-01-23 DIAGNOSIS — I4891 Unspecified atrial fibrillation: Secondary | ICD-10-CM

## 2013-01-23 LAB — POCT INR: INR: 3.4

## 2013-02-01 ENCOUNTER — Other Ambulatory Visit: Payer: Self-pay | Admitting: Cardiology

## 2013-02-01 ENCOUNTER — Telehealth: Payer: Self-pay

## 2013-02-02 ENCOUNTER — Ambulatory Visit: Payer: Self-pay | Admitting: Cardiology

## 2013-02-05 ENCOUNTER — Encounter: Payer: Self-pay | Admitting: Cardiology

## 2013-02-05 ENCOUNTER — Ambulatory Visit (INDEPENDENT_AMBULATORY_CARE_PROVIDER_SITE_OTHER): Payer: Medicare Other | Admitting: Cardiology

## 2013-02-05 VITALS — BP 124/88 | HR 120 | Ht 66.0 in | Wt 272.0 lb

## 2013-02-05 DIAGNOSIS — I429 Cardiomyopathy, unspecified: Secondary | ICD-10-CM

## 2013-02-05 DIAGNOSIS — I428 Other cardiomyopathies: Secondary | ICD-10-CM

## 2013-02-05 DIAGNOSIS — I2510 Atherosclerotic heart disease of native coronary artery without angina pectoris: Secondary | ICD-10-CM

## 2013-02-05 DIAGNOSIS — I5022 Chronic systolic (congestive) heart failure: Secondary | ICD-10-CM

## 2013-02-05 DIAGNOSIS — I4891 Unspecified atrial fibrillation: Secondary | ICD-10-CM

## 2013-02-05 LAB — COMPREHENSIVE METABOLIC PANEL
ALT: 31 U/L (ref 0–53)
AST: 29 U/L (ref 0–37)
Albumin: 4.1 g/dL (ref 3.5–5.2)
Alkaline Phosphatase: 54 U/L (ref 39–117)
BILIRUBIN TOTAL: 1.8 mg/dL — AB (ref 0.3–1.2)
BUN: 17 mg/dL (ref 6–23)
CALCIUM: 9.6 mg/dL (ref 8.4–10.5)
CHLORIDE: 101 meq/L (ref 96–112)
CO2: 30 meq/L (ref 19–32)
CREATININE: 1.1 mg/dL (ref 0.4–1.5)
GFR: 75.79 mL/min (ref >60.00)
GLUCOSE: 113 mg/dL — AB (ref 70–99)
Potassium: 4.3 mEq/L (ref 3.5–5.1)
Sodium: 141 mEq/L (ref 135–145)
Total Protein: 7.7 g/dL (ref 6.0–8.3)

## 2013-02-05 LAB — CBC WITH DIFFERENTIAL/PLATELET
BASOS ABS: 0 10*3/uL (ref 0.0–0.1)
Basophils Relative: 0.3 % (ref 0.0–3.0)
EOS PCT: 1.2 % (ref 0.0–5.0)
Eosinophils Absolute: 0.1 10*3/uL (ref 0.0–0.7)
HEMATOCRIT: 47.5 % (ref 39.0–52.0)
HEMOGLOBIN: 16.3 g/dL (ref 13.0–17.0)
LYMPHS ABS: 1.8 10*3/uL (ref 0.7–4.0)
LYMPHS PCT: 23.7 % (ref 12.0–46.0)
MCHC: 34.3 g/dL (ref 30.0–36.0)
MCV: 91.8 fl (ref 78.0–100.0)
Monocytes Absolute: 0.7 10*3/uL (ref 0.1–1.0)
Monocytes Relative: 8.9 % (ref 3.0–12.0)
NEUTROS ABS: 5.1 10*3/uL (ref 1.4–7.7)
Neutrophils Relative %: 65.9 % (ref 43.0–77.0)
Platelets: 225 10*3/uL (ref 150.0–400.0)
RBC: 5.17 Mil/uL (ref 4.22–5.81)
RDW: 14.3 % (ref 11.5–14.6)
WBC: 7.7 10*3/uL (ref 4.5–10.5)

## 2013-02-05 NOTE — Patient Instructions (Signed)
Your physician recommends that you have lab work today: CMET, CBC  Your physician recommends that you continue on your current medications as directed. Please refer to the Current Medication list given to you today.  Your physician wants you to follow-up in: 4 months with Dr. Dawna Part will receive a reminder letter in the mail two months in advance. If you don't receive a letter, please call our office to schedule the follow-up appointment.

## 2013-02-05 NOTE — Progress Notes (Signed)
[facility] [address], [address], [place]  [address] Phone: [phone] Fax:  [phone]  Date:  02/05/2013   ID:  Norman Rodriguez, DOB 1955/07/30, MRN [id number]  PCP:  No primary provider on file.   History of Present Illness: Norman Rodriguez is a 58 y.o. 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 His atrial fibrillation is rate controlled with digoxin, carvedilol. We have discussed in the past how he would not be a good candidate for ablative therapy. We also in the past discussed ICD and given his right bundle branch block how he would not be a candidate for biventricular pacing.  He does not like to take his Lasix but understands the importance of fluid balance. the past several days he has felt some increased shortness of breath, orthopnea. He took his shoulders Lasix and began to feel better. I've instructed him to watch his fluid intake. Same issues with compliance as before.     Wt Readings from Last 3 Encounters:  02/05/13 272 lb (123.378 kg)     Past Medical History  Diagnosis Date  . Cardiomyopathy     , Both ischemic and tachy induced   . CAD (coronary artery disease)     , s/p BMS 2.10mm 23 to LAD. CTO of RCA unsuccessful, good left to right collaterals  . Atrial fibrillation   . Chronic anticoagulation   . NSVT (nonsustained ventricular tachycardia)   . Obesity   . Hyperlipidemia   . HTN (hypertension)     No past surgical history on file.  Current Outpatient Prescriptions  Medication Sig Dispense Refill  . aspirin 325 MG EC tablet Take 325 mg by mouth daily.      [name] atorvastatin (LIPITOR) 40 MG tablet Take 1 tablet (40 mg total) by mouth daily.  30 tablet  6  . B Complex-C (B-COMPLEX WITH VITAMIN C) tablet Take 1  tablet by mouth daily.      . carvedilol (COREG) 25 MG tablet Take 1 tablet (25 mg total) by mouth 2 (two) times daily with a meal.  60 tablet  5  . clopidogrel (PLAVIX) 75 MG tablet Take 75 mg by mouth daily with breakfast.      . Coenzyme Q10 (CO Q-10) 200 MG CAPS Take 1 capsule by mouth daily.      [name] DIGOX 125 MCG tablet TAKE 1 TABLET BY MOUTH ONCE DAILY  30 tablet  4  . eplerenone (INSPRA) 25 MG tablet Take 25 mg by mouth daily.      . fexofenadine (ALLEGRA) 180 MG tablet Take 180 mg by mouth daily.      . furosemide (LASIX) 40 MG tablet Take 40 mg by mouth.      . LevOCARNitine (CARNITINE PO) Take 500 mg by mouth daily.      [name] losartan (COZAAR) 50 MG tablet Take 25 mg by mouth daily.      . Magnesium 400 MG CAPS Take by mouth daily.      . meloxicam (MOBIC) 15 MG tablet Take 15 mg by mouth daily.      . metoprolol succinate (TOPROL-XL) 100 MG 24 hr tablet Take 100 mg by mouth daily. Take with or immediately following a meal.      . Multiple Vitamin (MULTIVITAMIN) tablet Take 1 tablet  by mouth daily.      . Multiple Vitamins-Minerals (MULTIVITAMIN & MINERAL PO) Take by mouth daily.      . nitroGLYCERIN (NITROSTAT) 0.4 MG SL tablet Place 0.4 mg under the tongue every 5 (five) minutes as needed for chest pain.      . Omega-3 Fatty Acids (OMEGA 3 PO) Take by mouth.      . vitamin C (ASCORBIC ACID) 500 MG tablet Take 500 mg by mouth daily.      . vitamin E 400 UNIT capsule Take 400 Units by mouth daily.      [name] warfarin (COUMADIN) 7.5 MG tablet Take 1 tablet on all days except 1/2 tablet on Tuesday, Thursday, and Sunday, or as directed by [facility].  30 tablet  3  . zinc gluconate 50 MG tablet Take 25 mg by mouth daily.        No current facility-administered medications for this visit.    Allergies:    Allergies  Allergen Reactions  . Lisinopril Cough    Social History:  The patient  reports that he has been smoking Cigars.  He does not have any smokeless tobacco history on file.  He reports that he does not drink alcohol or use illicit drugs.   ROS:  Please see the history of present illness.   Denies any fevers, chills, orthopnea, PND    PHYSICAL EXAM: VS:  BP 124/88  Pulse 120  Ht 5\' 6"  (1.676 m)  Wt 272 lb (123.378 kg)  BMI 43.92 kg/m2 Well nourished, well developed, in no acute distress HEENT: normal Neck: no JVD Cardiac:  normal S1, S2; RRR; no murmur Lungs:  clear to auscultation bilaterally, no wheezing, rhonchi or rales Abd: soft, nontender, no hepatomegalyObese Ext: no edema Skin: warm and dry Neuro: no focal abnormalities noted  EKG:  Atrial fibrillation with heart rate of 120 beats per minute, occasional PVC.     ASSESSMENT AND PLAN:  1. Cardiomyopathy-previously has both ischemic as well as tachycardia induced. Beta blocker, angiotensin receptor blocker, aldosterone antagonist. 2. Atrial fibrillation-persistent/permanent. Continue with aggressive rate controlling measures. My suspicion is difficulty with medical compliance. He is on both beta blocker as well as digoxin. He missed and not taking his medications all the time as scheduled. I expressed the importance of good rate control for his atrial fibrillation help him feel better. He does note that he does feel better when he takes his medications as scheduled. He also understands to take a nitroglycerin Lasix if his weight increases. 3. Morbid obesity-continue to encourage weight loss. Low carbohydrate.  Signed, [name], MD [facility]  02/05/2013 2:49 PM

## 2013-02-06 MED ORDER — DIGOXIN 125 MCG PO TABS: 0.1250 mg | ORAL_TABLET | Freq: Every day | ORAL | Status: DC

## 2013-02-06 NOTE — Telephone Encounter (Signed)
Rx refilled.

## 2013-02-07 ENCOUNTER — Ambulatory Visit (INDEPENDENT_AMBULATORY_CARE_PROVIDER_SITE_OTHER): Payer: Medicare Other | Admitting: Pharmacist

## 2013-02-07 DIAGNOSIS — I4891 Unspecified atrial fibrillation: Secondary | ICD-10-CM

## 2013-02-07 LAB — POCT INR: INR: 1.8

## 2013-02-12 ENCOUNTER — Telehealth: Payer: Self-pay | Admitting: Cardiology

## 2013-02-12 NOTE — Telephone Encounter (Signed)
Lm on machine to call back.

## 2013-02-12 NOTE — Telephone Encounter (Signed)
New Prob  Pt is calling to inquire about an alternative for Furosemide. Please call.

## 2013-02-12 NOTE — Telephone Encounter (Signed)
Called wanting to know if there was another diuretic other than Lasix that he could take.  States he occassionally  has been having some cramping in his legs especially his (L) calf and some cramping around his abdomen. States he has been talking to some friends that have told him they have had cramping due to Lasix.  States he is only taking Lasix 20 mg daily unless he gets SOB and then will take another 20 mg.  He denies wt gain or swelling in ankles.  Explained the importance of taking medications as prescribed and should be taking  40 mg of Lasix daily. Reviewed his medications and he states he is not taking Metoprolol or Plavix. Is only taking Carvedilol 25 mg at HS instead of BID. Advised that Dr. Marlou Porch not in office today and will forward message to him to evaluate if can change to another diuretic.

## 2013-02-13 NOTE — Telephone Encounter (Signed)
The cramping could be from potassium depletion during furosemide usage. Please prescribe potassium chloride 20 mEq daily to be taken with Lasix. If he is still having cramping after this, please let me know.

## 2013-02-13 NOTE — Telephone Encounter (Signed)
Follow up     Have leg cramps-----just found out his mother has leg cramps.   Could it be from the furosemide or could it be hereditary?

## 2013-02-15 NOTE — Telephone Encounter (Signed)
Left message for patient to call the office

## 2013-02-16 ENCOUNTER — Telehealth: Payer: Self-pay | Admitting: Cardiology

## 2013-02-16 MED ORDER — POTASSIUM CHLORIDE CRYS ER 20 MEQ PO TBCR: 20.0000 meq | EXTENDED_RELEASE_TABLET | Freq: Every day | ORAL | Status: DC

## 2013-02-16 NOTE — Telephone Encounter (Signed)
Spoke with patient advised that the Lasix may be causing potassium depletion, which may be causing the cramps. Prescribing Lasix 20 meq once daily. If cramping continues give [place] a call.

## 2013-02-16 NOTE — Telephone Encounter (Signed)
Spoke with patient advised of medication change, added Klor-con 20 meq once daily to regiment to help with potassium level from taking Lasix.

## 2013-02-16 NOTE — Telephone Encounter (Signed)
Left message on pt.  Voicemail to call the office.

## 2013-02-16 NOTE — Telephone Encounter (Signed)
Was returning call from 1/20.  Advised had sent message to [facility].  Refer to note from [facility] regarding starting K+.

## 2013-02-16 NOTE — Telephone Encounter (Signed)
New message    Pt said Rodena Piety had left a msg on his vm that she had some information for him from Dr Marlou Porch

## 2013-02-16 NOTE — Telephone Encounter (Signed)
Lm on machine to call back.  Will forward to [facility]

## 2013-02-21 ENCOUNTER — Telehealth: Payer: Self-pay | Admitting: Cardiology

## 2013-02-21 LAB — POCT INR: INR: 1.9

## 2013-02-21 NOTE — Telephone Encounter (Signed)
New Problem:  Pt states he watched a news segment about aspirin and defibulators and now he has some questions for Dr. Marlou Porch. Pt states he would like a call back from the doctor or his nurse.

## 2013-02-22 ENCOUNTER — Ambulatory Visit (INDEPENDENT_AMBULATORY_CARE_PROVIDER_SITE_OTHER): Payer: Medicare Other | Admitting: Pharmacist

## 2013-02-22 DIAGNOSIS — I4891 Unspecified atrial fibrillation: Secondary | ICD-10-CM

## 2013-02-22 NOTE — Telephone Encounter (Signed)
Spoke with patient , patient concerned with taking a ASA with a defibrillator, advised that any medications that has been prescribed by Cr. Skains the risk factor has been considered. Patient verbalized understanding.

## 2013-03-07 LAB — POCT INR: INR: 2.5

## 2013-03-09 ENCOUNTER — Ambulatory Visit (INDEPENDENT_AMBULATORY_CARE_PROVIDER_SITE_OTHER): Payer: Medicare Other | Admitting: *Deleted

## 2013-03-09 DIAGNOSIS — I4891 Unspecified atrial fibrillation: Secondary | ICD-10-CM

## 2013-03-16 ENCOUNTER — Telehealth: Payer: Self-pay | Admitting: Cardiology

## 2013-03-16 NOTE — Telephone Encounter (Signed)
New message   Patient calling discuss when should he get his defib placement.

## 2013-03-19 NOTE — Telephone Encounter (Signed)
Left voicemail for patient to call the office 

## 2013-03-19 NOTE — Telephone Encounter (Signed)
Follow up     Returned [facility] call

## 2013-03-21 ENCOUNTER — Ambulatory Visit (INDEPENDENT_AMBULATORY_CARE_PROVIDER_SITE_OTHER): Payer: Medicare Other | Admitting: Pharmacist

## 2013-03-21 DIAGNOSIS — I4891 Unspecified atrial fibrillation: Secondary | ICD-10-CM

## 2013-03-21 LAB — POCT INR: INR: 3.1

## 2013-03-23 NOTE — Telephone Encounter (Signed)
Left a message for patient to call the office.

## 2013-03-29 ENCOUNTER — Ambulatory Visit (INDEPENDENT_AMBULATORY_CARE_PROVIDER_SITE_OTHER): Payer: Medicare Other | Admitting: Internal Medicine

## 2013-03-29 ENCOUNTER — Encounter: Payer: Self-pay | Admitting: Internal Medicine

## 2013-03-29 ENCOUNTER — Telehealth: Payer: Self-pay | Admitting: Internal Medicine

## 2013-03-29 VITALS — BP 126/87 | HR 100 | Ht 66.0 in | Wt 273.0 lb

## 2013-03-29 DIAGNOSIS — I5022 Chronic systolic (congestive) heart failure: Secondary | ICD-10-CM

## 2013-03-29 DIAGNOSIS — Z7901 Long term (current) use of anticoagulants: Secondary | ICD-10-CM

## 2013-03-29 DIAGNOSIS — I4891 Unspecified atrial fibrillation: Secondary | ICD-10-CM

## 2013-03-29 MED ORDER — RIVAROXABAN 20 MG PO TABS: 20.0000 mg | ORAL_TABLET | Freq: Every day | ORAL | Status: DC

## 2013-03-29 NOTE — Assessment & Plan Note (Signed)
Even though the likelihood of maintaining NSR is low, I have recommended a trial of Tikosyn. If he could hold NSR, he might have improvement in his LV function. His INR has been subtherapeutic. He currently denies palpitations. I have recommended a trial of Dofetilide in the hospital. While this may not work, maintenance of NSR would be his best potential to regain LV function.

## 2013-03-29 NOTE — Telephone Encounter (Signed)
New Message:  Pt would like a call back from the nurse. He wants to clarify some things from his appt. States it's in reference to his medication compliance, alcohol and smoking...  States he will give more details when the nurse calls back.

## 2013-03-29 NOTE — Assessment & Plan Note (Signed)
He has been subtherapeutic. I have recommended that he switch to Xarelto. Stop Coumadin.

## 2013-03-29 NOTE — Progress Notes (Signed)
HPI Norman Rodriguez is referred today by Dr. Marlou Porch for consideration for ICD implant and additional treatment of his atrial fibrillation. He was diagnosed with atrial fib in 2010. At that time he presented with Afib with an RVR. He was found to have severe 2 vessel disease and underwent stenting of an 80% LAD. Attempts to stent a chronically occluded RCA were unsuccessful. In the interim he has had attempted rate control of his atrial fibrillation. His records are not complete but he appears to have had an EF between 25-35% despite maximal medical therapy. There is concern that some of his LV dysfunction is related to his rapid atrial fibrillation. He has not had syncope. He admits to occaisional non-compliance with medications. He has never undergone DCCV. He has not been on any anti-arrhythmic medications at this point.  Allergies  Allergen Reactions  . Lisinopril Cough     Current Outpatient Prescriptions  Medication Sig Dispense Refill  . aspirin 325 MG EC tablet Take 325 mg by mouth daily.      [name] atorvastatin (LIPITOR) 40 MG tablet Take 1 tablet (40 mg total) by mouth daily.  30 tablet  6  . B Complex-C (B-COMPLEX WITH VITAMIN C) tablet Take 1 tablet by mouth daily.      . carvedilol (COREG) 25 MG tablet Take 25 mg by mouth at bedtime.      . clopidogrel (PLAVIX) 75 MG tablet Take 75 mg by mouth daily with breakfast.      . Coenzyme Q10 (CO Q-10) 200 MG CAPS Take 1 capsule by mouth daily.      . digoxin (DIGOX) 0.125 MG tablet Take 1 tablet (0.125 mg total) by mouth daily.  30 tablet  5  . eplerenone (INSPRA) 25 MG tablet Take 25 mg by mouth daily.      . fexofenadine (ALLEGRA) 180 MG tablet Take 180 mg by mouth daily.      . furosemide (LASIX) 40 MG tablet Take 40 mg by mouth.      . LevOCARNitine (CARNITINE PO) Take 500 mg by mouth daily.      [name] losartan (COZAAR) 50 MG tablet Take 25 mg by mouth daily.      . Magnesium 400 MG CAPS Take 1 capsule by mouth daily.       . meloxicam  (MOBIC) 15 MG tablet Take 15 mg by mouth daily.      . metoprolol succinate (TOPROL-XL) 100 MG 24 hr tablet Take 100 mg by mouth daily. Take with or immediately following a meal.      . Multiple Vitamin (MULTIVITAMIN) tablet Take 1 tablet by mouth daily.      . nitroGLYCERIN (NITROSTAT) 0.4 MG SL tablet Place 0.4 mg under the tongue every 5 (five) minutes as needed for chest pain.      . NON FORMULARY (Ribose) Take 1 capsule once a day      . Omega-3 Fatty Acids (OMEGA 3 PO) Take 1 capsule by mouth.       . potassium chloride SA (KLOR-CON M20) 20 MEQ tablet Take 1 tablet (20 mEq total) by mouth daily.  30 tablet  3  . vitamin C (ASCORBIC ACID) 500 MG tablet Take 500 mg by mouth daily.      . vitamin E 400 UNIT capsule Take 400 Units by mouth daily.      [name] zinc gluconate 50 MG tablet Take 25 mg by mouth daily.       . Rivaroxaban (  XARELTO) 20 MG TABS tablet Take 1 tablet (20 mg total) by mouth daily with supper.  30 tablet  11   No current facility-administered medications for this visit.     Past Medical History  Diagnosis Date  . Cardiomyopathy     , Both ischemic and tachy induced   . CAD (coronary artery disease)     , s/p BMS 2.66mm 23 to LAD. CTO of RCA unsuccessful, good left to right collaterals  . Atrial fibrillation   . Chronic anticoagulation   . NSVT (nonsustained ventricular tachycardia)   . Obesity   . Hyperlipidemia   . HTN (hypertension)   . Elevated cholesterol   . Coronary atherosclerosis of native coronary artery   . Left heart failure   . Obesity     ROS:   All systems reviewed and negative except as noted in the HPI.   Past Surgical History  Procedure Laterality Date  . Coronary angioplasty with stent placement      BMS 2.20mm 23 to LAD. CTO of RCA unsuccessful, good left to right collaterals     Family History  Problem Relation Age of Onset  . Other Father     Car accident  . Coronary artery disease Neg Hx      History   Social History  .  Marital Status: Single    Spouse Name: N/A    Number of Children: N/A  . Years of Education: N/A   Occupational History  . Not on file.   Social History Main Topics  . Smoking status: Light Tobacco Smoker    Types: Cigars  . Smokeless tobacco: Not on file     Comment: 1 cigar per month  . Alcohol Use: No  . Drug Use: No     Comment: 1 per month...  . Sexual Activity: Not on file   Other Topics Concern  . Not on file   Social History Narrative  . No narrative on file     BP 126/87  Pulse 100  Ht 5\' 6"  (1.676 m)  Wt 273 lb (123.832 kg)  BMI 44.08 kg/m2  Physical Exam:  Morbidly obese appearing NAD HEENT: Unremarkable Neck:  7 cmJVD, no thyromegally Lymphatics:  No adenopathy Back:  No CVA tenderness Lungs:  Clear HEART:  Regular rate rhythm, no murmurs, no rubs, no clicks Abd:  soft, positive bowel sounds, no organomegally, no rebound, no guarding Ext:  2 plus pulses, no edema, no cyanosis, no clubbing Skin:  No rashes no nodules Neuro:  CN II through XII intact, motor grossly intact  EKG - atrial fib with a RVR, prior IMI   Assess/Plan:

## 2013-03-29 NOTE — Telephone Encounter (Signed)
Spoke with patient and let him know we will continue with plan as Dr Lovena Le has written out.  i have read Dr Marlou Porch note and understand what he is saying.  The only medication he has not been complaint with is the am dose of Carvedilol

## 2013-03-29 NOTE — Patient Instructions (Signed)
Your physician has recommended you make the following change in your medication:  1) Stop Warfarin 2) Start Xarelto 20mg  daily   Will admit in 3 weeks for Tikosyn Load--- you will get a call from our office

## 2013-03-29 NOTE — Assessment & Plan Note (Signed)
His heart failure is class 3 and his EF is 30%. He needs improved rate control. We are limited by his medications and blood pressure. I suspect he will ultimately need AV node ablation and BiV ICD implant. Will see how he does with NSR (if maintainable) and will plan to recheck his Echo if he will maintain sinus. I discussed his treatment options and expectations in detail today.

## 2013-03-30 NOTE — Telephone Encounter (Signed)
Follow up    Patient has questions ask to why he getting a cathter. Seen Dr. Lovena Le on yesterday .

## 2013-04-02 NOTE — Telephone Encounter (Signed)
Spoke with patient advised that a cath. Is done to determine if any other cardiac procedures are needed. Patient verbalized understanding.

## 2013-04-09 ENCOUNTER — Other Ambulatory Visit: Payer: Self-pay | Admitting: Cardiology

## 2013-04-10 ENCOUNTER — Telehealth: Payer: Self-pay | Admitting: Cardiology

## 2013-04-10 NOTE — Telephone Encounter (Signed)
Spoke with pt.  Clarified appt information for Tikosyn load on 3/31.

## 2013-04-10 NOTE — Telephone Encounter (Signed)
New problem   Pt need to speak to Ysidro Evert about an appt for getting a shot and going over to hospital and stay for 3 days. Pt has questions about this appt. Please call pt. He need to know when is his appt.

## 2013-04-13 ENCOUNTER — Encounter: Payer: Self-pay | Admitting: Cardiology

## 2013-04-20 ENCOUNTER — Telehealth: Payer: Self-pay | Admitting: Cardiology

## 2013-04-20 NOTE — Telephone Encounter (Signed)
Pt will bring a list of his medications, toiletry items, slippers,and books to read during 3 day hospitalization.  Horton Chin RN

## 2013-04-20 NOTE — Telephone Encounter (Signed)
New message     Going to hosp tues for a tikosyn load---can he bring his own medication from home to the hosp to take?

## 2013-04-24 ENCOUNTER — Inpatient Hospital Stay (HOSPITAL_COMMUNITY)
Admission: AD | Admit: 2013-04-24 | Discharge: 2013-04-28 | DRG: 309 | Disposition: A | Discharge status: Home / Self Care | Payer: Medicare Other | Source: Ambulatory Visit | Attending: Internal Medicine | Admitting: Internal Medicine

## 2013-04-24 ENCOUNTER — Encounter (HOSPITAL_COMMUNITY): Payer: Self-pay | Admitting: *Deleted

## 2013-04-24 ENCOUNTER — Ambulatory Visit (INDEPENDENT_AMBULATORY_CARE_PROVIDER_SITE_OTHER): Payer: Medicare Other | Admitting: Pharmacist

## 2013-04-24 VITALS — BP 122/82 | HR 104 | Ht 66.0 in | Wt 272.0 lb

## 2013-04-24 DIAGNOSIS — Z7902 Long term (current) use of antithrombotics/antiplatelets: Secondary | ICD-10-CM

## 2013-04-24 DIAGNOSIS — Z7901 Long term (current) use of anticoagulants: Secondary | ICD-10-CM

## 2013-04-24 DIAGNOSIS — I4891 Unspecified atrial fibrillation: Principal | ICD-10-CM | POA: Diagnosis present

## 2013-04-24 DIAGNOSIS — I2510 Atherosclerotic heart disease of native coronary artery without angina pectoris: Secondary | ICD-10-CM

## 2013-04-24 DIAGNOSIS — G4730 Sleep apnea, unspecified: Secondary | ICD-10-CM | POA: Diagnosis present

## 2013-04-24 DIAGNOSIS — F17200 Nicotine dependence, unspecified, uncomplicated: Secondary | ICD-10-CM | POA: Diagnosis present

## 2013-04-24 DIAGNOSIS — Z888 Allergy status to other drugs, medicaments and biological substances status: Secondary | ICD-10-CM

## 2013-04-24 DIAGNOSIS — I5022 Chronic systolic (congestive) heart failure: Secondary | ICD-10-CM | POA: Diagnosis present

## 2013-04-24 DIAGNOSIS — I2589 Other forms of chronic ischemic heart disease: Secondary | ICD-10-CM | POA: Diagnosis present

## 2013-04-24 DIAGNOSIS — E669 Obesity, unspecified: Secondary | ICD-10-CM

## 2013-04-24 DIAGNOSIS — Z9861 Coronary angioplasty status: Secondary | ICD-10-CM

## 2013-04-24 DIAGNOSIS — Z7982 Long term (current) use of aspirin: Secondary | ICD-10-CM

## 2013-04-24 DIAGNOSIS — I10 Essential (primary) hypertension: Secondary | ICD-10-CM | POA: Diagnosis present

## 2013-04-24 DIAGNOSIS — E785 Hyperlipidemia, unspecified: Secondary | ICD-10-CM | POA: Diagnosis present

## 2013-04-24 DIAGNOSIS — I429 Cardiomyopathy, unspecified: Secondary | ICD-10-CM

## 2013-04-24 DIAGNOSIS — I428 Other cardiomyopathies: Secondary | ICD-10-CM

## 2013-04-24 DIAGNOSIS — Z6841 Body Mass Index (BMI) 40.0 and over, adult: Secondary | ICD-10-CM

## 2013-04-24 DIAGNOSIS — I509 Heart failure, unspecified: Secondary | ICD-10-CM | POA: Diagnosis present

## 2013-04-24 HISTORY — DX: Chronic systolic (congestive) heart failure: I50.22

## 2013-04-24 LAB — DIGOXIN LEVEL: Digoxin Level: <0.3 ng/mL — ABNORMAL LOW (ref 0.8–2.0)

## 2013-04-24 LAB — BASIC METABOLIC PANEL
BUN: 16 mg/dL (ref 6–23)
CALCIUM: 9.3 mg/dL (ref 8.4–10.5)
CO2: 25 mEq/L (ref 19–32)
Chloride: 102 mEq/L (ref 96–112)
Creat: 0.87 mg/dL (ref 0.50–1.35)
GLUCOSE: 99 mg/dL (ref 70–99)
POTASSIUM: 4.4 meq/L (ref 3.5–5.3)
Sodium: 140 mEq/L (ref 135–145)

## 2013-04-24 LAB — MAGNESIUM: Magnesium: 2.1 mg/dL (ref 1.5–2.5)

## 2013-04-24 MED ORDER — POTASSIUM CHLORIDE CRYS ER 20 MEQ PO TBCR
20.0000 meq | EXTENDED_RELEASE_TABLET | Freq: Every day | ORAL | Status: DC
  Administered 2013-04-24 – 2013-04-28 (×5): 20 meq via ORAL
  Filled 2013-04-24 (×5): qty 1

## 2013-04-24 MED ORDER — CLOPIDOGREL BISULFATE 75 MG PO TABS
75.0000 mg | ORAL_TABLET | Freq: Every day | ORAL | Status: DC
  Administered 2013-04-25: 75 mg via ORAL
  Filled 2013-04-24 (×2): qty 1

## 2013-04-24 MED ORDER — FUROSEMIDE 40 MG PO TABS
40.0000 mg | ORAL_TABLET | Freq: Every day | ORAL | Status: DC
  Administered 2013-04-24 – 2013-04-28 (×5): 40 mg via ORAL
  Filled 2013-04-24 (×5): qty 1

## 2013-04-24 MED ORDER — SODIUM CHLORIDE 0.9 % IJ SOLN
3.0000 mL | Freq: Two times a day (BID) | INTRAMUSCULAR | Status: DC
  Administered 2013-04-24 – 2013-04-26 (×5): 3 mL via INTRAVENOUS

## 2013-04-24 MED ORDER — SODIUM CHLORIDE 0.9 % IV SOLN: 250.0000 mL | INTRAVENOUS | Status: DC | PRN

## 2013-04-24 MED ORDER — DOFETILIDE 500 MCG PO CAPS
500.0000 ug | ORAL_CAPSULE | Freq: Two times a day (BID) | ORAL | Status: DC
  Administered 2013-04-24 – 2013-04-27 (×7): 500 ug via ORAL
  Filled 2013-04-24 (×10): qty 1

## 2013-04-24 MED ORDER — MAGNESIUM OXIDE 400 (241.3 MG) MG PO TABS
400.0000 mg | ORAL_TABLET | Freq: Every day | ORAL | Status: DC
  Administered 2013-04-24 – 2013-04-28 (×5): 400 mg via ORAL
  Filled 2013-04-24 (×5): qty 1

## 2013-04-24 MED ORDER — RIVAROXABAN 20 MG PO TABS
20.0000 mg | ORAL_TABLET | Freq: Every day | ORAL | Status: DC
  Administered 2013-04-24 – 2013-04-27 (×4): 20 mg via ORAL
  Filled 2013-04-24 (×5): qty 1

## 2013-04-24 MED ORDER — ATORVASTATIN CALCIUM 40 MG PO TABS
40.0000 mg | ORAL_TABLET | Freq: Every day | ORAL | Status: DC
  Administered 2013-04-24 – 2013-04-28 (×5): 40 mg via ORAL
  Filled 2013-04-24 (×5): qty 1

## 2013-04-24 MED ORDER — LOSARTAN POTASSIUM 25 MG PO TABS
25.0000 mg | ORAL_TABLET | Freq: Every day | ORAL | Status: DC
  Administered 2013-04-24 – 2013-04-28 (×5): 25 mg via ORAL
  Filled 2013-04-24 (×5): qty 1

## 2013-04-24 MED ORDER — SODIUM CHLORIDE 0.9 % IJ SOLN: 3.0000 mL | INTRAMUSCULAR | Status: DC | PRN

## 2013-04-24 MED ORDER — CARVEDILOL 12.5 MG PO TABS
12.5000 mg | ORAL_TABLET | Freq: Two times a day (BID) | ORAL | Status: DC
  Administered 2013-04-24 – 2013-04-28 (×8): 12.5 mg via ORAL
  Filled 2013-04-24 (×10): qty 1

## 2013-04-24 MED ORDER — SPIRONOLACTONE 25 MG PO TABS
25.0000 mg | ORAL_TABLET | Freq: Every day | ORAL | Status: DC
  Filled 2013-04-24 (×5): qty 1

## 2013-04-24 NOTE — Progress Notes (Signed)
Pharmacy Consult for Dofetilide (Tikosyn) Iniation  Admit Complaint: 58 y.o. male admitted 04/24/2013 with atrial fibrillation to be initiated on dofetilide.   Assessment:  Patient Exclusion Criteria: If any screening criteria checked as "Yes", then  patient  should NOT receive dofetilide until criteria item is corrected. If "Yes" please indicate correction plan.  YES  NO Patient  Exclusion Criteria Correction Plan  []  [x]  Baseline QTc interval is greater than or equal to 440 msec. IF above YES box checked dofetilide contraindicated unless patient has ICD; then may proceed if QTc 500-550 msec or with known ventricular conduction abnormalities may proceed with QTc 550-600 msec. QTc =     []  [x]  Magnesium level is less than 1.8 mEq/l : Last magnesium:  Lab Results  Component Value Date   MG 2.1 04/24/2013         []  [x]  Potassium level is less than 4 mEq/l : Last potassium:  Lab Results  Component Value Date   K 4.4 04/24/2013         []  [x]  Patient is known or suspected to have a digoxin level greater than 2 ng/ml: Lab Results  Component Value Date   DIGOXIN <0.30* 04/24/2013      []  [x]  Creatinine clearance less than 20 ml/min (calculated using Cockcroft-Gault, actual body weight and serum creatinine): Estimated Creatinine Clearance: 111.7 ml/min (by C-G formula based on Cr of 0.87).    []  [x]  Patient has received drugs known to prolong the QT intervals within the last 48 hours(phenothiazines, tricyclics or tetracyclic antidepressants, erythromycin, H-1 antihistamines, cisapride, fluoroquinolones, azithromycin). Drugs not listed above may have an, as yet, undetected potential to prolong the QT interval, updated information on QT prolonging agents is available at this website:QT prolonging agents   []  [x]  Patient received a dose of hydrochlorothiazide (Oretic) alone or in any combination including triamterene (Dyazide, Maxzide) in the last 48 hours.   []  [x]  Patient received a  medication known to increase dofetilide plasma concentrations prior to initial dofetilide dose:    Trimethoprim (Primsol, Proloprim) in the last 36 hours   Verapamil (Calan, Verelan) in the last 36 hours or a sustained release dose in the last 72 hours   Megestrol (Megace) in the last 5 days    Cimetidine (Tagamet) in the last 6 hours   Ketoconazole (Nizoral) in the last 24 hours   Itraconazole (Sporanox) in the last 48 hours    Prochlorperazine (Compazine) in the last 36 hours    []  [x]  Patient is known to have a history of torsades de pointes; congenital or acquired long QT syndromes.   []  [x]  Patient has received a Class 1 antiarrhythmic with less than 2 half-lives since last dose. (Disopyramide, Quinidine, Procainamide, Lidocaine, Mexiletine, Flecainide, Propafenone)   []  [x]  Patient has received amiodarone therapy in the past 3 months or amiodarone level is greater than 0.3 ng/ml.    Patient has been appropriately anticoagulated with Xarelto. Ordering provider was confirmed at [email or web address] if they are not listed on the [facility] list.  Goal of Therapy:  Follow renal function, electrolytes, potential drug interactions, and dose adjustment. Provide education and 1 week supply at discharge.  Plan:  1.  Initiate dofetilide based on renal function: (MD has ordered)  [facility] Calculated CrCl  Dose q12h  [x]  > 60 ml/min 500 mcg  []  40-60 ml/min 250 mcg  []  20-40 ml/min 125 mcg   2. Follow up QTc after the first 5 doses, renal function, electrolytes (K &  Mg) daily x 3     days, dose adjustment, success of initiation and facilitate 1 week discharge supply as     clinically indicated.  3. Initiate Tikosyn education video (Call [phone] and ask for video # [phone]).  4. Place Enrollment Form on the chart for discharge supply of dofetilide.  Rober Minion, PharmD., MS Clinical Pharmacist Pager:  [phone] Thank you for allowing pharmacy to be part of this  patients care team. 4:54 PM 04/24/2013

## 2013-04-24 NOTE — Assessment & Plan Note (Addendum)
Patient reported to the hospital already as he doesn't have transportation.  Potassium, magnesium, and digoxin levels all within appropriate range to receive first dose tonight.  Patient hasn't missed any Xarelto doses since he started it a little over 3 weeks ago.  His renal function is typically stable, and CrCl > 100 ml/min today, so I would anticipate 500 mcg q 12 hours.  Patient will go over to hospital now to get some lunch, and knows to go to admitting after later today.   Patient notified that he has a room ready and will be in [address] [place].

## 2013-04-24 NOTE — Progress Notes (Signed)
Utilization Review Completed.Norman Rodriguez T3/31/2015  

## 2013-04-24 NOTE — Progress Notes (Signed)
Norman Rodriguez is a 58 y.o. Male who presents today for Tikosyn initiation.  He was seen by Dr. Lovena Le approximately 4 weeks ago to discuss possible ICD implant.  Patient was diagnosed with AFib in 2010, and has never tried antiarrythmic therapy.  He hasn't tried a cardioversion before.  Patient has an EF between 25-35%.  Dr. Lovena Le discussed option of Tikosyn with patient in hopes of improving his LV function.  His INR was subtherapeutic around the time he saw Dr. Lovena Le, so he was changed to daily Xarelto 20 mg qPM.  Patient was agreeable to start Tikosyn, and shows up today to discuss Tikosyn use.  Patient educated on potential side effects of Tikosyn, including QTc prolongation.  He is aware of the importance of compliance and will call the office if he misses more than 2 doses in a row.  Cost of medication shouldn't be an issue with his medicaid.  Patient prefers a 90 day supply on his medications given his lack of reliable transportation.  Reviewed patient's medication list.  He is currently not taking any QTc prolongating medications or contraindicated medications.  He has not tried any other antiarrythmic medications.  He has been on Xarelto 20 mg qPM for over 3 weeks now, and he confirms that he hasn't missed any doses of this.  He is digoxin 0.125 mg qd, so will check a digoxin level today.  EKG on 04/24/13 reviewed by Dr. Lovena Le showed Afib with v rate of 77 bpm, and QTc 389 sec.    Patient doesn't have reliable transportation, so will be admitted today regardless.  He has had a BMET, Magnesium, and digoxin level drawn STAT in the office, and is going to the hospital to eat some lunch and wait to be admitted.  Admitting has been notified that patient will be coming over today.    04/24/13 lab results:  Mg-2.1, K-4.4, Digoxin < 0.3 - labs are acceptable for Tikosyn initiation.  CrCl > 100 ml/min.

## 2013-04-24 NOTE — H&P (Signed)
ELECTROPHYSIOLOGY HISTORY AND PHYSICAL    Patient ID: Norman Rodriguez MRN: [id number], DOB/AGE: 02-26-1955 58 y.o.  Admit date: 04/24/2013  Primary Cardiologist: [name] Electrophysiologist: Lovena Le  Reason for Admission: Tikosyn loading  HPI:  Norman Rodriguez is a 58 y.o. male with a past medical history of ischemic and tachycardia mediated cardiomyopathy (last echo 07/2011 - EF 30%), permanent atrial fibrillation (diagnosed in 2010 with no prior attempts at rhythm control), CAD, hypertension, and hyperlipidemia.  He was recently referred to EP in the outpatient setting for consideration of primary prevention ICD implant.  Recommendations were to pursue rhythm control in hopes that his cardiomyopathy would improve.  Risks, benefits, and alternatives to Tikosyn loading were reviewed with the patient who wished to proceed.  Because of sub therapeutic INR's, his Warfarin was changed to Xarelto.  He reports compliance for the last 3 weeks.   He was evaluated in the [place] clinic this morning and had labs drawn K 4.4, Mg 2.1, Dig level <0.3.  He denies chest pain, dizziness, or syncope.  He endorses chronic shortness of breath and lower extremity edema.  ROS is otherwise negative.   Past Medical History  Diagnosis Date  . Cardiomyopathy     , Both ischemic and tachy induced   . CAD (coronary artery disease)     , s/p BMS 2.33mm 23 to LAD. CTO of RCA unsuccessful, good left to right collaterals  . Atrial fibrillation   . Chronic anticoagulation   . NSVT (nonsustained ventricular tachycardia)   . Obesity   . Hyperlipidemia   . HTN (hypertension)   . Elevated cholesterol   . Coronary atherosclerosis of native coronary artery   . Left heart failure   . Obesity      Surgical History:  Past Surgical History  Procedure Laterality Date  . Coronary angioplasty with stent placement      BMS 2.1mm 23 to LAD. CTO of RCA unsuccessful, good left to right collaterals      Prescriptions prior to admission  Medication Sig Dispense Refill  . aspirin 325 MG EC tablet Take 325 mg by mouth daily.      [name] atorvastatin (LIPITOR) 40 MG tablet Take 1 tablet (40 mg total) by mouth daily.  30 tablet  6  . B Complex-C (B-COMPLEX WITH VITAMIN C) tablet Take 1 tablet by mouth daily.      . carvedilol (COREG) 25 MG tablet Take 25 mg by mouth at bedtime.      . clopidogrel (PLAVIX) 75 MG tablet Take 75 mg by mouth daily with breakfast.      . Coenzyme Q10 (CO Q-10) 200 MG CAPS Take 1 capsule by mouth daily.      . digoxin (DIGOX) 0.125 MG tablet Take 1 tablet (0.125 mg total) by mouth daily.  30 tablet  5  . eplerenone (INSPRA) 25 MG tablet TAKE 1 TABLET BY MOUTH EVERY DAY  90 tablet  0  . fexofenadine (ALLEGRA) 180 MG tablet Take 180 mg by mouth daily.      . furosemide (LASIX) 40 MG tablet Take 40 mg by mouth.      . LevOCARNitine (CARNITINE PO) Take 500 mg by mouth daily.      [name] losartan (COZAAR) 50 MG tablet Take 25 mg by mouth daily.      . Magnesium 400 MG CAPS Take 1 capsule by mouth daily.       . meloxicam (MOBIC) 15 MG tablet Take 15 mg by mouth  daily.      . metoprolol succinate (TOPROL-XL) 100 MG 24 hr tablet Take 100 mg by mouth daily. Take with or immediately following a meal.      . Multiple Vitamin (MULTIVITAMIN) tablet Take 1 tablet by mouth daily.      . nitroGLYCERIN (NITROSTAT) 0.4 MG SL tablet Place 0.4 mg under the tongue every 5 (five) minutes as needed for chest pain.      . NON FORMULARY (Ribose) Take 1 capsule once a day      . Omega-3 Fatty Acids (OMEGA 3 PO) Take 1 capsule by mouth.       . potassium chloride SA (KLOR-CON M20) 20 MEQ tablet Take 1 tablet (20 mEq total) by mouth daily.  30 tablet  3  . Rivaroxaban (XARELTO) 20 MG TABS tablet Take 1 tablet (20 mg total) by mouth daily with supper.  30 tablet  11  . vitamin C (ASCORBIC ACID) 500 MG tablet Take 500 mg by mouth daily.      . vitamin E 400 UNIT capsule Take 400 Units by mouth daily.       [name] zinc gluconate 50 MG tablet Take 25 mg by mouth daily.         Inpatient Medications:   Allergies:  Allergies  Allergen Reactions  . Lisinopril Cough    History   Social History  . Marital Status: Single    Spouse Name: N/A    Number of Children: N/A  . Years of Education: N/A   Occupational History  . Not on file.   Social History Main Topics  . Smoking status: Light Tobacco Smoker    Types: Cigars  . Smokeless tobacco: Not on file     Comment: 1 cigar per month  . Alcohol Use: No  . Drug Use: No     Comment: 1 per month...  . Sexual Activity: Not on file   Other Topics Concern  . Not on file   Social History Narrative  . No narrative on file     Family History  Problem Relation Age of Onset  . Other Father     Car accident  . Coronary artery disease Neg Hx     Physical Exam: Filed Vitals:   04/24/13 1206 04/24/13 1207  BP:  103/79  Pulse:  96  Temp:  99.1 F (37.3 C)  TempSrc:  Oral  Resp:  18  Height: 5\' 6"  (1.676 m)   Weight: 271 lb 2.7 oz (123 kg)   SpO2:  96%    GEN- The patient is overweight appearing, alert and oriented x 3 today.   Head- normocephalic, atraumatic Eyes-  Sclera clear, conjunctiva pink Ears- hearing intact Oropharynx- clear Neck- supple  Lungs- Clear to ausculation bilaterally, normal work of breathing Heart- irregular rate and rhythm, no murmurs, rubs or gallops, PMI not laterally displaced GI- soft, NT, ND, + BS Extremities- no clubbing, cyanosis, or edema MS- no significant deformity or atrophy Skin- no rash or lesion Psych- euthymic mood, full affect Neuro- strength and sensation are intact   Labs:   Lab Results  Component Value Date   WBC 7.7 02/05/2013   HGB 16.3 02/05/2013   HCT 47.5 02/05/2013   MCV 91.8 02/05/2013   PLT 225.0 02/05/2013     Recent Labs Lab 04/24/13 1016  NA 140  K 4.4  CL 102  CO2 25  BUN 16  CREATININE 0.87  CALCIUM 9.3  GLUCOSE 99    Radiology/Studies: No  results  found.  [id number] fibrillation, ventricular rate 77, QRS 78, QTc 389  TELEMETRY: atrial fibrillation with controlled ventricular response  Assessment and Plan:  1. Longstanding persistent atrial fibrillation The patient presents with symptomatic afib.  His anticipated success rates of maintaining sinus are quite low.  He is admitted for tikosyn initiation.  I think that this is a very good approach.  He is morbidly obese and likely has sleep apnea.  I would recommend outpatient sleep study.  He has not had an echo since 2013.  I would therefore order an echo to evaluate LA size, EF etc at this time.  He is appropriately anticoagulated with xarelto.  I will stop digoxin which has a suggested increased mortality in patients with afib. I think that ultimately, the best option for him may be a convergent ablation approach.  2. Cardiomyopathy Repeat echo I agree that we should reassess his EF after maintaining sinus rhythm Continue medical therapy optimization  3. Hypertension Stable No change required today  4. Obesity Weight loss advised Consider outpatient sleep study  5. CAD No ischemic symptoms No changes Stop ASA I think that we should consider stopping plavix as his last stent was a BMS in 2010.  I will defer this decision to Dr Lovena Le

## 2013-04-24 NOTE — Progress Notes (Signed)
TIKOSYN given QT/QTC 374/460 from EKG prior to administration.

## 2013-04-25 DIAGNOSIS — I5022 Chronic systolic (congestive) heart failure: Secondary | ICD-10-CM

## 2013-04-25 DIAGNOSIS — I517 Cardiomegaly: Secondary | ICD-10-CM

## 2013-04-25 LAB — BASIC METABOLIC PANEL
BUN: 17 mg/dL (ref 6–23)
CO2: 24 mEq/L (ref 19–32)
CREATININE: 0.87 mg/dL (ref 0.50–1.35)
Calcium: 9.3 mg/dL (ref 8.4–10.5)
Chloride: 100 mEq/L (ref 96–112)
GFR calc non Af Amer: >90 mL/min (ref >90)
Glucose, Bld: 121 mg/dL — ABNORMAL HIGH (ref 70–99)
Potassium: 4.1 mEq/L (ref 3.7–5.3)
Sodium: 140 mEq/L (ref 137–147)

## 2013-04-25 LAB — MAGNESIUM: MAGNESIUM: 2.1 mg/dL (ref 1.5–2.5)

## 2013-04-25 MED ORDER — MELOXICAM 15 MG PO TABS
15.0000 mg | ORAL_TABLET | Freq: Every day | ORAL | Status: DC
  Administered 2013-04-25 – 2013-04-28 (×4): 15 mg via ORAL
  Filled 2013-04-25 (×4): qty 1

## 2013-04-25 NOTE — Progress Notes (Signed)
  Echocardiogram 2D Echocardiogram has been performed.  Norman Rodriguez 04/25/2013, 11:16 AM

## 2013-04-25 NOTE — Progress Notes (Addendum)
Patient ID: AZRIEL JAKOB, male   DOB: [birth year], 58 y.o.   MRN: [id number]   Patient Name: Norman Rodriguez Date of Encounter: 04/25/2013     Active Problems:   Atrial fibrillation    SUBJECTIVE No chest pain. Dyspnea at baseline  CURRENT MEDS . atorvastatin  40 mg Oral Daily  . carvedilol  12.5 mg Oral BID WC  . clopidogrel  75 mg Oral Q breakfast  . dofetilide  500 mcg Oral BID  . furosemide  40 mg Oral Daily  . losartan  25 mg Oral Daily  . magnesium oxide  400 mg Oral Daily  . potassium chloride SA  20 mEq Oral Daily  . Rivaroxaban  20 mg Oral Q supper  . sodium chloride  3 mL Intravenous Q12H  . spironolactone  25 mg Oral Daily    OBJECTIVE  Filed Vitals:   04/24/13 1750 04/24/13 2045 04/25/13 0520 04/25/13 0522  BP: 114/71 111/75 106/59 115/65  Pulse: 89 90 83 80  Temp:  98.1 F (36.7 C) 97.7 F (36.5 C) 97.8 F (36.6 C)  TempSrc:  Oral Oral Oral  Resp:  18 18 18   Height:      Weight:      SpO2:  95% 98% 98%   No intake or output data in the 24 hours ending 04/25/13 0828 Filed Weights   04/24/13 1206  Weight: 271 lb 2.7 oz (123 kg)    PHYSICAL EXAM  General: Pleasant, NAD. Neuro: Alert and oriented X 3. Moves all extremities spontaneously. Psych: Normal affect. HEENT:  Normal  Neck: Supple without bruits or JVD. Lungs:  Resp regular and unlabored, CTA. Heart: RRR no s3, s4, or murmurs. Abdomen: Soft, non-tender, obese, non-distended, BS + x 4.  Extremities: No clubbing, cyanosis or edema. DP/PT/Radials 2+ and equal bilaterally.  Accessory Clinical Findings  CBC No results found for this basename: WBC, NEUTROABS, HGB, HCT, MCV, PLT,  in the last 72 hours Basic Metabolic Panel  Recent Labs  04/24/13 1016 04/25/13 0437  NA 140 140  K 4.4 4.1  CL 102 100  CO2 25 24  GLUCOSE 99 121*  BUN 16 17  CREATININE 0.87 0.87  CALCIUM 9.3 9.3  MG 2.1 2.1   Liver Function Tests No results found for this basename: AST, ALT, ALKPHOS, BILITOT,  PROT, ALBUMIN,  in the last 72 hours No results found for this basename: LIPASE, AMYLASE,  in the last 72 hours Cardiac Enzymes No results found for this basename: CKTOTAL, CKMB, CKMBINDEX, TROPONINI,  in the last 72 hours BNP No components found with this basename: POCBNP,  D-Dimer No results found for this basename: DDIMER,  in the last 72 hours Hemoglobin A1C No results found for this basename: HGBA1C,  in the last 72 hours Fasting Lipid Panel No results found for this basename: CHOL, HDL, LDLCALC, TRIG, CHOLHDL, LDLDIRECT,  in the last 72 hours Thyroid Function Tests No results found for this basename: TSH, T4TOTAL, FREET3, T3FREE, THYROIDAB,  in the last 72 hours  TELE Atrial fib with a cvr  ECG  Atrial fib with QTC about 460  Radiology/Studies  No results found.  ASSESSMENT AND PLAN 1. Atrial fib with a CVR 2. Chronic systolic CHf 3. Ischemic CM, s/p BMS 5 years ago 3. Admit for Tikosyn Rec: QTC is ok on my manual check. Continue Tikosyn. Stop plavix.  Nalanie Winiecki,M.D.  04/25/2013 8:28 AM

## 2013-04-26 DIAGNOSIS — I4891 Unspecified atrial fibrillation: Principal | ICD-10-CM

## 2013-04-26 LAB — MAGNESIUM: Magnesium: 2.2 mg/dL (ref 1.5–2.5)

## 2013-04-26 LAB — BASIC METABOLIC PANEL
BUN: 18 mg/dL (ref 6–23)
CHLORIDE: 102 meq/L (ref 96–112)
CO2: 25 meq/L (ref 19–32)
CREATININE: 0.88 mg/dL (ref 0.50–1.35)
Calcium: 8.9 mg/dL (ref 8.4–10.5)
GFR calc Af Amer: >90 mL/min (ref >90)
GFR calc non Af Amer: >90 mL/min (ref >90)
GLUCOSE: 120 mg/dL — AB (ref 70–99)
POTASSIUM: 4.2 meq/L (ref 3.7–5.3)
Sodium: 140 mEq/L (ref 137–147)

## 2013-04-26 NOTE — Care Management Note (Unsigned)
    Page 1 of 1   04/26/2013     3:36:15 PM   CARE MANAGEMENT NOTE 04/26/2013  Patient:  Norman Rodriguez, Norman Rodriguez   Account Number:  [id number]  Date Initiated:  04/26/2013  Documentation initiated by:  Jo Cerone  Subjective/Objective Assessment:   PT ADM ON 3/31 WITH AFIB FOR TIKOSYN LOADING.  PTA, PT INDEPENDENT, LIVES WITH SPOUSE.     Action/Plan:   CM REFERRAL FOR TIKOSYN; WILL CHECK COVERAGE AND PHARMACY AVAILABILITY.   Anticipated DC Date:  04/27/2013   Anticipated DC Plan:  [place]  CM consult  Medication Assistance      Choice offered to / List presented to:             Status of service:  In process, will continue to follow Medicare Important Message given?   (If response is "NO", the following Medicare IM given date fields will be blank) Date Medicare IM given:   Date Additional Medicare IM given:    Discharge Disposition:    Per UR Regulation:  Reviewed for med. necessity/level of care/duration of stay  If discussed at [place] of Stay Meetings, dates discussed:    Comments:  04/26/13 Kobey Sides,RN,BSN [phone] CHECKED WITH PT'S PHARMACY, [facility] [address]:  PHARMACY CAN DISPENSE TIKOSYN, AND HAS 500MCG DOSE IN STOCK.  DRUG IS COVERED BY PT'S PART D PLAN, AND WILL HAVE A COPAY OF $3.60.  PT WILL NEED ONE WEEK'S SUPPLY OF TIKOSYN,  TO BE FILLED BY MAIN PHARMACY PRIOR TO DC.

## 2013-04-27 ENCOUNTER — Encounter (HOSPITAL_COMMUNITY)
Admission: AD | Disposition: A | Discharge status: Home / Self Care | Payer: Self-pay | Source: Ambulatory Visit | Attending: Internal Medicine

## 2013-04-27 ENCOUNTER — Encounter (HOSPITAL_COMMUNITY): Payer: Medicare Other | Admitting: Certified Registered Nurse Anesthetist

## 2013-04-27 ENCOUNTER — Inpatient Hospital Stay (HOSPITAL_COMMUNITY): Payer: Medicare Other | Admitting: Certified Registered Nurse Anesthetist

## 2013-04-27 DIAGNOSIS — I509 Heart failure, unspecified: Secondary | ICD-10-CM | POA: Diagnosis not present

## 2013-04-27 DIAGNOSIS — I5022 Chronic systolic (congestive) heart failure: Secondary | ICD-10-CM | POA: Diagnosis not present

## 2013-04-27 DIAGNOSIS — Z6841 Body Mass Index (BMI) 40.0 and over, adult: Secondary | ICD-10-CM | POA: Diagnosis not present

## 2013-04-27 DIAGNOSIS — I4891 Unspecified atrial fibrillation: Secondary | ICD-10-CM | POA: Diagnosis not present

## 2013-04-27 HISTORY — PX: CARDIOVERSION: SHX1299

## 2013-04-27 LAB — BASIC METABOLIC PANEL
BUN: 19 mg/dL (ref 6–23)
CO2: 30 meq/L (ref 19–32)
CREATININE: 0.94 mg/dL (ref 0.50–1.35)
Calcium: 9.1 mg/dL (ref 8.4–10.5)
Chloride: 102 mEq/L (ref 96–112)
GFR calc Af Amer: >90 mL/min (ref >90)
GFR calc non Af Amer: 89 mL/min — ABNORMAL LOW (ref >90)
Glucose, Bld: 108 mg/dL — ABNORMAL HIGH (ref 70–99)
Potassium: 4.8 mEq/L (ref 3.7–5.3)
Sodium: 141 mEq/L (ref 137–147)

## 2013-04-27 LAB — MAGNESIUM: Magnesium: 2.4 mg/dL (ref 1.5–2.5)

## 2013-04-27 SURGERY — CARDIOVERSION
Anesthesia: Monitor Anesthesia Care

## 2013-04-27 MED ORDER — SODIUM CHLORIDE 0.9 % IV SOLN
250.0000 mL | INTRAVENOUS | Status: DC
  Administered 2013-04-27: 250 mL via INTRAVENOUS

## 2013-04-27 MED ORDER — PROPOFOL 10 MG/ML IV BOLUS
INTRAVENOUS | Status: DC | PRN
  Administered 2013-04-27: 80 mg via INTRAVENOUS

## 2013-04-27 MED ORDER — SODIUM CHLORIDE 0.9 % IJ SOLN: 3.0000 mL | INTRAMUSCULAR | Status: DC | PRN

## 2013-04-27 MED ORDER — SODIUM CHLORIDE 0.9 % IJ SOLN
3.0000 mL | Freq: Two times a day (BID) | INTRAMUSCULAR | Status: DC
  Administered 2013-04-27 – 2013-04-28 (×2): 3 mL via INTRAVENOUS

## 2013-04-27 MED ORDER — LIDOCAINE HCL (CARDIAC) 20 MG/ML IV SOLN
INTRAVENOUS | Status: DC | PRN
  Administered 2013-04-27: 40 mg via INTRAVENOUS

## 2013-04-27 MED ORDER — SODIUM CHLORIDE 0.9 % IV SOLN
INTRAVENOUS | Status: DC | PRN
  Administered 2013-04-27: 13:00:00 via INTRAVENOUS

## 2013-04-27 NOTE — Interval H&P Note (Signed)
History and Physical Interval Note:  04/27/2013 12:36 PM  Norman Rodriguez  has presented today for surgery, with the diagnosis of afib  The various methods of treatment have been discussed with the patient and family. After consideration of risks, benefits and other options for treatment, the patient has consented to  Procedure(s): BEDSIDE CARDIOVERSION (N/A) as a surgical intervention .  The patient's history has been reviewed, patient examined, no change in status, stable for surgery.  I have reviewed the patient's chart and labs.  Questions were answered to the patient's satisfaction.     [name]

## 2013-04-27 NOTE — CV Procedure (Signed)
Procedure: Electrical Cardioversion Indications:  Atrial Fibrillation  Procedure Details:  Consent: Risks of procedure as well as the alternatives and risks of each were explained to the (patient/caregiver).  Consent for procedure obtained.  Time Out: Verified patient identification, verified procedure, site/side was marked, verified correct patient position, special equipment/implants available, medications/allergies/relevent history reviewed, required imaging and test results available.  Performed  Patient placed on cardiac monitor, pulse oximetry, supplemental oxygen as necessary.  Sedation given: Propofol IV, Dr. Glennon Mac Pacer pads placed anterior and posterior chest.  Cardioverted 1 time(s).  Cardioverted at [place]. Synchronized biphasic  Evaluation: Findings: Post procedure EKG shows: NSR 60 bpm Complications: None Patient did tolerate procedure well.  Time Spent Directly with the Patient:  30 minutes   Eowyn Tabone 04/27/2013, 12:50 PM

## 2013-04-27 NOTE — Progress Notes (Signed)
EKG performed on Pt.  Pt currently NSR with a HR of 65.

## 2013-04-27 NOTE — Anesthesia Postprocedure Evaluation (Signed)
  Anesthesia Post-op Note  Patient: Norman Rodriguez  Procedure(s) Performed: Procedure(s): BEDSIDE CARDIOVERSION (N/A)  Patient Location: Nursing Unit  Anesthesia Type:MAC  Level of Consciousness: awake, alert  and oriented  Airway and Oxygen Therapy: Patient Spontanous Breathing and Patient connected to nasal cannula oxygen  Post-op Pain: none  Post-op Assessment: Post-op Vital signs reviewed, Patient's Cardiovascular Status Stable, Respiratory Function Stable, Patent Airway and No signs of Nausea or vomiting  Post-op Vital Signs: Reviewed and stable  Complications: No apparent anesthesia complications

## 2013-04-27 NOTE — Anesthesia Preprocedure Evaluation (Addendum)
Anesthesia Evaluation  Patient identified by MRN, date of birth, ID band Patient awake    Reviewed: Allergy & Precautions, H&P , NPO status , Patient's Chart, lab work & pertinent test results  History of Anesthesia Complications Negative for: history of anesthetic complications  Airway Mallampati: II TM Distance: >3 FB Neck ROM: Full    Dental  (+) Missing, Poor Dentition, Chipped, Dental Advisory Given   Pulmonary shortness of breath, Current Smoker,          Cardiovascular hypertension, Pt. on home beta blockers + CAD and + Past MI (stent placed "a couple years ago")     Neuro/Psych negative neurological ROS     GI/Hepatic negative GI ROS, Neg liver ROS,   Endo/Other  negative endocrine ROS  Renal/GU negative Renal ROS     Musculoskeletal   Abdominal   Peds  Hematology Xarelto   Anesthesia Other Findings   Reproductive/Obstetrics                          Anesthesia Physical Anesthesia Plan  ASA: IV  Anesthesia Plan: MAC   Post-op Pain Management:    Induction: Intravenous  Airway Management Planned: Mask  Additional Equipment:   Intra-op Plan:   Post-operative Plan:   Informed Consent: I have reviewed the patients History and Physical, chart, labs and discussed the procedure including the risks, benefits and alternatives for the proposed anesthesia with the patient or authorized representative who has indicated his/her understanding and acceptance.   Dental advisory given  Plan Discussed with: CRNA and Anesthesiologist  Anesthesia Plan Comments:         Anesthesia Quick Evaluation

## 2013-04-27 NOTE — Progress Notes (Signed)
Patient ID: BERTIE SIMIEN, male   DOB: 1955/06/16, 58 y.o.   MRN: [id number]   Patient Name: Norman Rodriguez Date of Encounter: 04/27/2013     Active Problems:   Atrial fibrillation    SUBJECTIVE No chest pain. Dyspnea at baseline. Remains in atrial fib.  CURRENT MEDS . atorvastatin  40 mg Oral Daily  . carvedilol  12.5 mg Oral BID WC  . dofetilide  500 mcg Oral BID  . furosemide  40 mg Oral Daily  . losartan  25 mg Oral Daily  . magnesium oxide  400 mg Oral Daily  . meloxicam  15 mg Oral Daily  . potassium chloride SA  20 mEq Oral Daily  . Rivaroxaban  20 mg Oral Q supper  . sodium chloride  3 mL Intravenous Q12H  . spironolactone  25 mg Oral Daily    OBJECTIVE  Filed Vitals:   04/26/13 0532 04/26/13 0741 04/26/13 1340 04/26/13 2242  BP: 107/93 110/80 94/60 126/84  Pulse: 57 114 83 94  Temp: 98.4 F (36.9 C)  98.2 F (36.8 C) 99.4 F (37.4 C)  TempSrc: Oral  Oral Oral  Resp: 18  18 18   Height:      Weight:      SpO2: 94%  99% 96%    Intake/Output Summary (Last 24 hours) at 04/27/13 0002 Last data filed at 04/26/13 1300  Gross per 24 hour  Intake    840 ml  Output      0 ml  Net    840 ml   Filed Weights   04/24/13 1206  Weight: 271 lb 2.7 oz (123 kg)    PHYSICAL EXAM  General: Pleasant, NAD. Neuro: Alert and oriented X 3. Moves all extremities spontaneously. Psych: Normal affect. HEENT:  Normal  Neck: Supple without bruits or JVD. Lungs:  Resp regular and unlabored, CTA. Heart: IRRR no s3, s4, or murmurs. Abdomen: Soft, non-tender, obese, non-distended, BS + x 4.  Extremities: No clubbing, cyanosis or edema. DP/PT/Radials 2+ and equal bilaterally.  Accessory Clinical Findings  CBC No results found for this basename: WBC, NEUTROABS, HGB, HCT, MCV, PLT,  in the last 72 hours Basic Metabolic Panel  Recent Labs  04/25/13 0437 04/26/13 0545  NA 140 140  K 4.1 4.2  CL 100 102  CO2 24 25  GLUCOSE 121* 120*  BUN 17 18  CREATININE 0.87  0.88  CALCIUM 9.3 8.9  MG 2.1 2.2   Liver Function Tests No results found for this basename: AST, ALT, ALKPHOS, BILITOT, PROT, ALBUMIN,  in the last 72 hours No results found for this basename: LIPASE, AMYLASE,  in the last 72 hours Cardiac Enzymes No results found for this basename: CKTOTAL, CKMB, CKMBINDEX, TROPONINI,  in the last 72 hours BNP No components found with this basename: POCBNP,  D-Dimer No results found for this basename: DDIMER,  in the last 72 hours Hemoglobin A1C No results found for this basename: HGBA1C,  in the last 72 hours Fasting Lipid Panel No results found for this basename: CHOL, HDL, LDLCALC, TRIG, CHOLHDL, LDLDIRECT,  in the last 72 hours Thyroid Function Tests No results found for this basename: TSH, T4TOTAL, FREET3, T3FREE, THYROIDAB,  in the last 72 hours  TELE Atrial fib with a cvr  ECG  Atrial fib with QTC about 460  Radiology/Studies  No results found.  ASSESSMENT AND PLAN 1. Atrial fib with a CVR 2. Chronic systolic CHf 3. Ischemic CM, s/p BMS 5 years ago 3.  Admit for Tikosyn Rec: QTC is ok on my manual check. Continue Tikosyn. Stop plavix. Plan DCCV on 04/27/13  Mikle Bosworth.D.

## 2013-04-27 NOTE — Progress Notes (Signed)
   ELECTROPHYSIOLOGY ROUNDING NOTE    Patient Name: BAWI LAKINS Date of Encounter: 04/27/2013    SUBJECTIVE:Patient feels well this morning.  No chest pain, dyspnea at baseline.  Tikosyn loading started 04-24-13 - pt has been converted to NSR. Await QT.  TELEMETRY: Reviewed telemetry pt in atrial fibrillation with controlled ventricular response, now NSR Filed Vitals:   04/26/13 0741 04/26/13 1340 04/26/13 2242 04/27/13 0536  BP: 110/80 94/60 126/84 97/65  Pulse: 114 83 94 82  Temp:  98.2 F (36.8 C) 99.4 F (37.4 C) 98.1 F (36.7 C)  TempSrc:  Oral Oral Oral  Resp:  18 18 18   Height:      Weight:      SpO2:  99% 96% 99%    Intake/Output Summary (Last 24 hours) at 04/27/13 0558 Last data filed at 04/26/13 1300  Gross per 24 hour  Intake    840 ml  Output      0 ml  Net    840 ml    CURRENT MEDICATIONS: . atorvastatin  40 mg Oral Daily  . carvedilol  12.5 mg Oral BID WC  . dofetilide  500 mcg Oral BID  . furosemide  40 mg Oral Daily  . losartan  25 mg Oral Daily  . magnesium oxide  400 mg Oral Daily  . meloxicam  15 mg Oral Daily  . potassium chloride SA  20 mEq Oral Daily  . Rivaroxaban  20 mg Oral Q supper  . sodium chloride  3 mL Intravenous Q12H  . sodium chloride  3 mL Intravenous Q12H  . spironolactone  25 mg Oral Daily    LABS: Basic Metabolic Panel:  Recent Labs  04/26/13 0545 04/27/13 0348  NA 140 141  K 4.2 4.8  CL 102 102  CO2 25 30  GLUCOSE 120* 108*  BUN 18 19  CREATININE 0.88 0.94  CALCIUM 8.9 9.1  MG 2.2 2.4     PHYSICAL EXAM Well appearing obese, 58 yo man, NAD HEENT: Unremarkable,[place], AT Neck:  6 JVD, no thyromegally Back:  No CVA tenderness Lungs:  Clear with no wheezes, rales, or rhonchi HEART:  Regular rate rhythm, no murmurs, no rubs, no clicks Abd:  soft, positive bowel sounds, no organomegally, no rebound, no guarding Ext:  2 plus pulses, no edema, no cyanosis, no clubbing Skin:  No rashes no nodules Neuro:  CN II  through XII intact, motor grossly intact    Active Problems:   Atrial fibrillation  tikosyn admission S/p DCCV, restoring NSR Rec: will observe overnight, follow QT intervals in NSR, and plan to discharge home tomorrow.  Cristopher Peru, M.D.

## 2013-04-27 NOTE — Transfer of Care (Signed)
Immediate Anesthesia Transfer of Care Note  Patient: Norman Rodriguez  Procedure(s) Performed: Procedure(s): BEDSIDE CARDIOVERSION (N/A)  Patient Location: Nursing Unit  Anesthesia Type:MAC  Level of Consciousness: awake, alert  and oriented  Airway & Oxygen Therapy: Patient Spontanous Breathing and Patient connected to nasal cannula oxygen  Post-op Assessment: Report given to PACU RN, Post -op Vital signs reviewed and stable and Patient moving all extremities  Post vital signs: Reviewed and stable  Complications: No apparent anesthesia complications

## 2013-04-28 ENCOUNTER — Other Ambulatory Visit (HOSPITAL_COMMUNITY): Payer: Self-pay | Admitting: Physician Assistant

## 2013-04-28 ENCOUNTER — Encounter (HOSPITAL_COMMUNITY): Payer: Self-pay | Admitting: Physician Assistant

## 2013-04-28 MED ORDER — LIDOCAINE HCL (CARDIAC) 20 MG/ML IV SOLN
INTRAVENOUS | Status: AC
  Filled 2013-04-28: qty 5

## 2013-04-28 MED ORDER — DOFETILIDE 125 MCG PO CAPS
375.0000 ug | ORAL_CAPSULE | Freq: Two times a day (BID) | ORAL | Status: DC
  Administered 2013-04-28: 375 ug via ORAL
  Filled 2013-04-28 (×3): qty 1

## 2013-04-28 MED ORDER — PROPOFOL 10 MG/ML IV BOLUS
INTRAVENOUS | Status: AC
  Filled 2013-04-28: qty 20

## 2013-04-28 MED ORDER — SUCCINYLCHOLINE CHLORIDE 20 MG/ML IJ SOLN
INTRAMUSCULAR | Status: AC
  Filled 2013-04-28: qty 1

## 2013-04-28 MED ORDER — CARVEDILOL 12.5 MG PO TABS: 12.5000 mg | ORAL_TABLET | Freq: Two times a day (BID) | ORAL | Status: DC

## 2013-04-28 MED ORDER — DOFETILIDE 125 MCG PO CAPS: 375.0000 ug | ORAL_CAPSULE | Freq: Two times a day (BID) | ORAL | Status: DC

## 2013-04-28 NOTE — Progress Notes (Signed)
   SUBJECTIVE: The patient is doing well today.  At this time, he denies chest pain, shortness of breath, or any new concerns.  Maintaining SR - QTc this morning 500 by manual calculation  CURRENT MEDICATIONS: . atorvastatin  40 mg Oral Daily  . carvedilol  12.5 mg Oral BID WC  . dofetilide  500 mcg Oral BID  . furosemide  40 mg Oral Daily  . losartan  25 mg Oral Daily  . magnesium oxide  400 mg Oral Daily  . meloxicam  15 mg Oral Daily  . potassium chloride SA  20 mEq Oral Daily  . Rivaroxaban  20 mg Oral Q supper  . sodium chloride  3 mL Intravenous Q12H  . sodium chloride  3 mL Intravenous Q12H  . spironolactone  25 mg Oral Daily   . sodium chloride 250 mL (04/27/13 0628)    OBJECTIVE: Physical Exam: Filed Vitals:   04/27/13 1438 04/27/13 1659 04/27/13 2044 04/28/13 0504  BP: 98/52 101/67 98/57 85/54   Pulse: 69 72 65 69  Temp: 98.8 F (37.1 C)  98.1 F (36.7 C) 97.9 F (36.6 C)  TempSrc: Oral  Oral Oral  Resp: 18  20 19   Height:      Weight:      SpO2: 100%  97% 94%    Intake/Output Summary (Last 24 hours) at 04/28/13 0708 Last data filed at 04/27/13 2110  Gross per 24 hour  Intake     53 ml  Output      0 ml  Net     53 ml    Telemetry reveals sinus rhythm with occasional PVC's, short run AIVR  Well appearing obese, 58 yo man, NAD  HEENT: Unremarkable,[place], AT  Neck: 6 JVD, no thyromegally  Back: No CVA tenderness  Lungs: Clear with no wheezes, rales, or rhonchi  HEART: Regular rate rhythm, no murmurs, no rubs, no clicks  Abd: soft, positive bowel sounds, no organomegally, no rebound, no guarding  Ext: 2 plus pulses, no edema, no cyanosis, no clubbing  Skin: No rashes no nodules  Neuro: CN II through XII intact, motor grossly intact  LABS: Basic Metabolic Panel:  Recent Labs  04/26/13 0545 04/27/13 0348  NA 140 141  K 4.2 4.8  CL 102 102  CO2 25 30  GLUCOSE 120* 108*  BUN 18 19  CREATININE 0.88 0.94  CALCIUM 8.9 9.1  MG 2.2 2.4      ASSESSMENT AND PLAN:  Active Problems:   Atrial fibrillation chronic systolic CHF Obesity Prolonged QT Rec: his QT is slightly prolonged. I have asked the patient to reduce his dose of Tikosyn to 375 mcg twice daily and to followup early next week for ecg in our office. No other medication changes. Follow up EKG Monday 04-30-13 to reassess QTc

## 2013-04-28 NOTE — Progress Notes (Signed)
   CARE MANAGEMENT NOTE 04/28/2013  Patient:  Norman Rodriguez, Norman Rodriguez   Account Number:  [id number]  Date Initiated:  04/26/2013  Documentation initiated by:  AMERSON,JULIE  Subjective/Objective Assessment:   PT ADM ON 3/31 WITH AFIB FOR TIKOSYN LOADING.  PTA, PT INDEPENDENT, LIVES WITH SPOUSE.     Action/Plan:   CM REFERRAL FOR TIKOSYN; WILL CHECK COVERAGE AND PHARMACY AVAILABILITY.   Anticipated DC Date:  04/27/2013   Anticipated DC Plan:  [place]  CM consult  Medication Assistance      Choice offered to / List presented to:             Status of service:  Completed, signed off Medicare Important Message given?   (If response is "NO", the following Medicare IM given date fields will be blank) Date Medicare IM given:   Date Additional Medicare IM given:    Discharge Disposition:  HOME/SELF CARE  Per UR Regulation:  Reviewed for med. necessity/level of care/duration of stay  If discussed at [place] of Stay Meetings, dates discussed:    Comments:  04/28/13 CM spoke with RN and Tikosyn  being filled by in-house pharmacy.  CSW arranging for transportation home. No other CM needs were communicated.  Mariane Masters, BSN, CM [phone].  04/26/13 JULIE AMERSON,RN,BSN [phone] CHECKED WITH PT'S PHARMACY, [facility] [address]:  PHARMACY CAN DISPENSE TIKOSYN, AND HAS 500MCG DOSE IN STOCK.  DRUG IS COVERED BY PT'S PART D PLAN, AND WILL HAVE A COPAY OF $3.60.  PT WILL NEED ONE WEEK'S SUPPLY OF TIKOSYN,  TO BE FILLED BY MAIN PHARMACY PRIOR TO DC.

## 2013-04-28 NOTE — Discharge Summary (Signed)
Discharge Summary   Patient ID: DEMITRIUS CRASS MRN: [id number], DOB/AGE: 08/14/1955 58 y.o. Admit date: 04/24/2013 D/C date:     04/28/2013  Primary Care Provider: No PCP Per Patient Primary Cardiologist: [name]  Electrophysiologist: Lovena Le  Primary Discharge Diagnoses:  1. Persistent atrial fibrillation - dx 2010, no prior attempts at rhythm control before this admission - initiated on Tikosyn this admission, s/p successful DCCV to NSR 04/27/13 2. 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 Obesity Body mass index is 43.79 kg/(m^2).  Secondary Discharge Diagnoses:  1. CAD s/p BMS to LAD, CTO of RCA unsuccessful with good L-R collaterals 2010 2. H/o NSVT 3. Obesity 4. HLD 5. HTN  Hospital Course: Mr. Kornegay is a 58 y.o. male with a PMH of mixed ischemic and tachycardia mediated cardiomyopathy (last echo 07/2011 - EF 30%), persistent atrial fibrillation (diagnosed in 2010 with no prior attempts at rhythm control), CAD, hypertension, and hyperlipidemia. He was recently referred to EP in the outpatient setting for consideration of primary prevention ICD implant. Recommendations were to pursue rhythm control in hopes that his cardiomyopathy would improve. Risks, benefits, and alternatives to Tikosyn loading were reviewed with the patient who wished to proceed. Because of sub therapeutic INR's, his Warfarin was changed to Xarelto with reported compliance for 3 weeks. Pre-Tikosyn labs were acceptable with K 4.4, Mg 2.1, Dig level <0.3. At time of admission he denied chest pain, dizziness or syncope. He endorsed chronic shortness of breath and lower extremity edema.   Tikosyn was initiated initially at 556mcg BID. Dr. Rayann Heman stopped his digoxin (which has a suggested increased mortality in patients with afib). Aspirin was stopped due to stable CAD without recent ischemic changes or intervention, in the setting of ongoing anticoagulation with Xarelto.  (Notes also reference Plavix but the patient affirms he was not taking this prior to admission.) 2D echo was rechecked this admission showing EF 30-35%, no RWMA, normal RV, mod dilated LA. The patient underwent DCCV yesterday (04/27/13) with 1 200J shock with successful restoration of NSR at 60bpm. QTC this morning was 500 by manual calculation. Dr. Lovena Le has recommended to decrease dose to 324mcg BID - he has signed prescriptions for a 7-day rx to obtain from pharmacy prior to discharge and the patient's regular rx as well. The plan is for recheck EKG in 2 days on Monday (04/30/13), recheck EKG and BMET/Mg on 05/04/13, and followup appointment 05/31/13. Dr. Lovena Le has seen and examined the patient today and feels he is stable for discharge.  Other FYIs this admission - Coreg was decreased to 12.5mg  BID on admission. BP ran on softer side. The patient also refused spironolactone during his admission and said he wasn't taking this at home (he was supposed to be on Inspra at home). K on day of discharge was 4.8 (range 4.1-4.8) Given stable K level & soft BP's without spironolactone substitution (59R-41U systolic), we have held this at discharge. Consideration to resuming can be given as an outpatient. I reviewed pt's outpatient OTC vitamin supplements with pharmacy given new Tikosyn initiation who did not feel there were any to cause drug interaction. He was advised not to start any new supplements without talking to his doctor first. Dr. Rayann Heman recommended to consider outpt sleep study due to morbid obesity and AF.   Discharge Vitals: Blood pressure 103/67, pulse 63, temperature 97.9 F (36.6 C), temperature source Oral, resp. rate 19, height 5\' 6"  (1.676 m), weight 271 lb 2.7 oz (  123 kg), SpO2 94.00%.  Labs: Lab Results  Component Value Date   WBC 7.7 02/05/2013   HGB 16.3 02/05/2013   HCT 47.5 02/05/2013   MCV 91.8 02/05/2013   PLT 225.0 02/05/2013     Recent Labs Lab 04/27/13 0348  NA 141  K 4.8  CL 102   CO2 30  BUN 19  CREATININE 0.94  CALCIUM 9.1  GLUCOSE 108*    Diagnostic Studies/Procedures   DCCV as above  Discharge Medications   Current Discharge Medication List    START taking these medications   Details  dofetilide (TIKOSYN) 125 MCG capsule Take 3 capsules (375 mcg total) by mouth 2 (two) times daily.      CONTINUE these medications which have CHANGED   Details  carvedilol (COREG) 12.5 MG tablet Take 1 tablet (12.5 mg total) by mouth 2 (two) times daily with a meal. Qty: 60 tablet, Refills: 3      CONTINUE these medications which have NOT CHANGED   Details  atorvastatin (LIPITOR) 40 MG tablet Take 1 tablet (40 mg total) by mouth daily.     B Complex-C (B-COMPLEX WITH VITAMIN C) tablet Take 1 tablet by mouth daily.    Coenzyme Q10 (CO Q-10) 200 MG CAPS Take 1 capsule by mouth daily.    fexofenadine (ALLEGRA) 180 MG tablet Take 180 mg by mouth daily.    furosemide (LASIX) 40 MG tablet Take 40 mg by mouth daily.     LevOCARNitine (CARNITINE PO) Take 500 mg by mouth daily.    losartan (COZAAR) 50 MG tablet Take 25 mg by mouth daily.    Magnesium 400 MG CAPS Take 1 capsule by mouth daily.     meloxicam (MOBIC) 15 MG tablet Take 15 mg by mouth daily.    Multiple Vitamin (MULTIVITAMIN) tablet Take 1 tablet by mouth daily.    nitroGLYCERIN (NITROSTAT) 0.4 MG SL tablet Place 0.4 mg under the tongue every 5 (five) minutes as needed for chest pain.    NON FORMULARY (Ribose) Take 1 capsule once a day    Omega-3 Fatty Acids (OMEGA 3 PO) Take 2 capsules by mouth daily.     potassium chloride SA (KLOR-CON M20) 20 MEQ tablet Take 1 tablet (20 mEq total) by mouth daily.     Rivaroxaban (XARELTO) 20 MG TABS tablet Take 1 tablet (20 mg total) by mouth daily with supper.    Associated Diagnoses: Chronic anticoagulation    vitamin C (ASCORBIC ACID) 500 MG tablet Take 500 mg by mouth daily.    vitamin E 400 UNIT capsule Take 400 Units by mouth daily.    zinc  gluconate 50 MG tablet Take 25 mg by mouth daily.       STOP taking these medications     aspirin 325 MG EC tablet      digoxin (DIGOX) 0.125 MG tablet      eplerenone (INSPRA) 25 MG tablet         Disposition   The patient will be discharged in stable condition to home. Discharge Orders   Future Appointments Provider Department Dept Phone   04/30/2013 11:00 AM [facility] Nurse [place] Office [phone]   05/04/2013 9:00 AM [facility] Nurse [place] Office [phone]   05/31/2013 10:00 AM Andrez Grime, PA-C [facility] [phone]   Future Orders Complete By Expires   Diet - low sodium heart healthy  As directed    Increase activity slowly  As directed  Scheduling Instructions:     Since Tikosyn is a very complex medicine, do not start any new supplements without talking to your doctor first.     Follow-up Information   Follow up with [facility]. (04/30/13 at 11am - for EKG)    Specialty:  Cardiology   Contact information:   [address] [place] [address] [phone]      Follow up with [facility]. (05/04/13 at 9am - for EKG and labwork (BMET and magnesium level))    Specialty:  Cardiology   Contact information:   [address] [place] [address] [phone]      Follow up with Ileene Hutchinson, PA-C. (05/31/13 at 10am for post-hospital appointment)    Specialty:  Cardiology   Contact information:   [address] [place] [address] [phone]         Duration of Discharge Encounter: Greater than 30 minutes including physician and PA time.  Signed, Melina Copa PA-C 04/28/2013, 11:33 AM  EP Attending Patient seen and examined. Agree with above. [place] for discharge. He will obtain a 12 lead ECG early next week.  Mikle Bosworth.D.

## 2013-04-30 ENCOUNTER — Encounter (HOSPITAL_COMMUNITY): Payer: Self-pay | Admitting: Cardiovascular Disease

## 2013-05-01 ENCOUNTER — Ambulatory Visit (INDEPENDENT_AMBULATORY_CARE_PROVIDER_SITE_OTHER): Payer: Medicare Other

## 2013-05-01 VITALS — BP 112/72 | HR 57 | Ht 66.0 in | Wt 275.8 lb

## 2013-05-01 DIAGNOSIS — I4891 Unspecified atrial fibrillation: Secondary | ICD-10-CM

## 2013-05-01 NOTE — Patient Instructions (Signed)
Continue current medications  Follow-up as scheduled

## 2013-05-01 NOTE — Progress Notes (Signed)
1.) Reason for visit: Patient here for EKG after starting Tikosyn  2.) Name of MD requesting visit: Dr.Taylor  3.) H&P: Patient had EKG in office today .EKG was done revealed sinus rhythm rate 57 beats/min.  4.) ROS related to problem:Patient had no complaints  5.) Assessment and plan per MD: No changes follow up as scheduled  6.) Provider sign-of(MD statement):   7.)

## 2013-05-04 ENCOUNTER — Other Ambulatory Visit: Payer: Self-pay | Admitting: *Deleted

## 2013-05-04 ENCOUNTER — Other Ambulatory Visit (INDEPENDENT_AMBULATORY_CARE_PROVIDER_SITE_OTHER): Payer: Medicare Other

## 2013-05-04 ENCOUNTER — Ambulatory Visit (INDEPENDENT_AMBULATORY_CARE_PROVIDER_SITE_OTHER): Payer: Medicare Other | Admitting: Cardiology

## 2013-05-04 VITALS — BP 110/63 | HR 118 | Resp 20

## 2013-05-04 DIAGNOSIS — I4891 Unspecified atrial fibrillation: Secondary | ICD-10-CM

## 2013-05-04 LAB — BASIC METABOLIC PANEL
BUN: 12 mg/dL (ref 6–23)
CO2: 27 mEq/L (ref 19–32)
Calcium: 8.8 mg/dL (ref 8.4–10.5)
Chloride: 104 mEq/L (ref 96–112)
Creatinine, Ser: 0.8 mg/dL (ref 0.4–1.5)
GFR: 99.04 mL/min (ref >60.00)
GLUCOSE: 100 mg/dL — AB (ref 70–99)
POTASSIUM: 4.4 meq/L (ref 3.5–5.1)
SODIUM: 137 meq/L (ref 135–145)

## 2013-05-04 LAB — MAGNESIUM: Magnesium: 2 mg/dL (ref 1.5–2.5)

## 2013-05-04 NOTE — Progress Notes (Signed)
1.)  Reason for visit:  EKG  2.) Name of MD requesting visit: Dr. Lovena Le.  3.) H&P:  Pt here for EKG. Recent cardioversion and on Tikosyn.  4.) ROS related to problem:  Pt reports he has some shortness of breath and dizziness today. This is not new and is chronic for him. It is not worse than usual. Oxygen sat initially 90%-93%. Improved to 95% after rest. He has not taken today's Lasix dose and will take when he gets home. He is not aware of any irregular heartbeat.  5.) Assessment and plan per MD:  EKG reviewed by Dr.Nelson. Pt now in atrial fibrillation. Per Dr. Meda Coffee pt should return to office on 4/13 for EKG. If in atrial fibrillation at that time cardioversion will be planned for 4/14.  I spoke with pt and gave him this information. Due to transportation issues he cannot be here until 4/15. I reviewed with Dr. Meda Coffee and pt should have EKG on 4/15 as he cannot get here sooner.  Visit scheduled for 4/15 at 11:00.  Pt given documentation of this appt.  He is aware he should go to ED or call EMS if symptoms develop prior to this appt.   Provider sign-of(MD statement): Dorothy Spark  The patient was discussed with Leodis Liverpool and Dr Rayann Heman. I agree with the above.  Now in a-fib with RVR (1 week post DCCV), insignificant change in QT/QTc interval. The patient was advised to continue taking Tikosyn, return for an ECG check early next week and if still in A-fib we will arrange for an outpatient cardioversion.  Dorothy Spark 05/04/2013

## 2013-05-07 ENCOUNTER — Telehealth: Payer: Self-pay | Admitting: Cardiology

## 2013-05-07 NOTE — Telephone Encounter (Signed)
New Prob    Pt is needing confirmation for his last visit 4/10 faxed to [facility] transportation. Please call.

## 2013-05-09 ENCOUNTER — Encounter: Payer: Self-pay | Admitting: General Surgery

## 2013-05-09 ENCOUNTER — Ambulatory Visit (INDEPENDENT_AMBULATORY_CARE_PROVIDER_SITE_OTHER): Payer: Medicare Other | Admitting: Nurse Practitioner

## 2013-05-09 ENCOUNTER — Encounter: Payer: Self-pay | Admitting: Nurse Practitioner

## 2013-05-09 VITALS — BP 108/58 | HR 60 | Resp 18

## 2013-05-09 DIAGNOSIS — R0602 Shortness of breath: Secondary | ICD-10-CM

## 2013-05-09 DIAGNOSIS — I4891 Unspecified atrial fibrillation: Secondary | ICD-10-CM

## 2013-05-09 DIAGNOSIS — Z5181 Encounter for therapeutic drug level monitoring: Secondary | ICD-10-CM

## 2013-05-09 LAB — PROTIME-INR
INR: 1.7 ratio — ABNORMAL HIGH (ref 0.8–1.0)
Prothrombin Time: 17.5 s — ABNORMAL HIGH (ref 10.2–12.4)

## 2013-05-09 LAB — CBC WITH DIFFERENTIAL/PLATELET
BASOS PCT: 0.4 % (ref 0.0–3.0)
Basophils Absolute: 0 10*3/uL (ref 0.0–0.1)
EOS ABS: 0.1 10*3/uL (ref 0.0–0.7)
EOS PCT: 1.4 % (ref 0.0–5.0)
HEMATOCRIT: 47.5 % (ref 39.0–52.0)
Hemoglobin: 16 g/dL (ref 13.0–17.0)
LYMPHS ABS: 2.7 10*3/uL (ref 0.7–4.0)
Lymphocytes Relative: 29.6 % (ref 12.0–46.0)
MCHC: 33.8 g/dL (ref 30.0–36.0)
MCV: 93.9 fl (ref 78.0–100.0)
Monocytes Absolute: 0.8 10*3/uL (ref 0.1–1.0)
Monocytes Relative: 8.5 % (ref 3.0–12.0)
NEUTROS ABS: 5.5 10*3/uL (ref 1.4–7.7)
NEUTROS PCT: 60.1 % (ref 43.0–77.0)
Platelets: 247 10*3/uL (ref 150.0–400.0)
RBC: 5.05 Mil/uL (ref 4.22–5.81)
RDW: 14.4 % (ref 11.5–14.6)
WBC: 9.1 10*3/uL (ref 4.5–10.5)

## 2013-05-09 LAB — BRAIN NATRIURETIC PEPTIDE: Pro B Natriuretic peptide (BNP): 140 pg/mL — ABNORMAL HIGH (ref 0.0–100.0)

## 2013-05-09 NOTE — Progress Notes (Signed)
1.) Reason for visit: EKG to check to see if patient remains in atrial fibrillation  2.) Name of MD requesting visit: Dr. Meda Coffee  3.) H&P: patient presents per instructions to return for ekg to determine if patient remains in atrial fibrillation; patient seen on 4/10 after starting on Tikosyn a few weeks prior, pt remained in a fib and was advised to return and if a fib persists, to be scheduled for DCCV  4.) ROS related to problem: Patient denies c/o except mild SOB.  Patient states he feels better than he has felt in a while and believes this is r/t the Tikosyn and switching from Coumadin to Xarelto; pt in NAD, skin warm, dry, and acyanotic  Patient states he cannot arrange transportation for DCCV until Monday April 20.  Patient states he does not feel poorly and was advised to   Call 911 if symptoms worsen  5.) Assessment and plan per MD: 12-lead ekg reviewed by Dr. Radford Pax who advised that patient get a BNP checked today due to SOB and that she is in agreement with plan to await DCCV Monday  6.) Provider sign-of(MD statement):  7.)

## 2013-05-09 NOTE — Patient Instructions (Addendum)
Your physician recommends that you continue on your current medications as directed. Please refer to the Current Medication list given to you today.  Your physician recommends that you have lab work today:  BNP, CBC, PT/INR for pre-cardioversion  Your physician has recommended that you have a Cardioversion (DCCV) on Monday, April 20 at 9:30 with Dr. Meda Coffee. Electrical Cardioversion uses a jolt of electricity to your heart either through paddles or wired patches attached to your chest. This is a controlled, usually prescheduled, procedure. Defibrillation is done under light anesthesia in the hospital, and you usually go home the day of the procedure. This is done to get your heart back into a normal rhythm. You are not awake for the procedure. Please see the instruction sheet given to you today.

## 2013-05-11 NOTE — Telephone Encounter (Signed)
05-11-13 pt rtn call, pls call back  At [phone]

## 2013-05-11 NOTE — Telephone Encounter (Signed)
LVM for patient to call the office

## 2013-05-11 NOTE — Telephone Encounter (Signed)
Patient states that  Issue resolved , information faxed.Marland KitchenMarland Kitchen

## 2013-05-14 ENCOUNTER — Encounter (HOSPITAL_COMMUNITY): Payer: Self-pay | Admitting: Emergency Medicine

## 2013-05-14 ENCOUNTER — Ambulatory Visit (HOSPITAL_COMMUNITY)
Admission: RE | Admit: 2013-05-14 | Discharge: 2013-05-14 | Disposition: A | Discharge status: Still In Hospital | Payer: Medicare Other | Source: Ambulatory Visit | Attending: Cardiology | Admitting: Cardiology

## 2013-05-14 ENCOUNTER — Emergency Department (HOSPITAL_COMMUNITY): Payer: Medicare Other

## 2013-05-14 ENCOUNTER — Encounter (HOSPITAL_COMMUNITY)
Admission: RE | Disposition: A | Discharge status: Still In Hospital | Payer: Self-pay | Source: Ambulatory Visit | Attending: Cardiology

## 2013-05-14 ENCOUNTER — Observation Stay (HOSPITAL_COMMUNITY)
Admission: EM | Admit: 2013-05-14 | Discharge: 2013-05-15 | Disposition: A | Discharge status: Home / Self Care | Payer: Medicare Other | Attending: Cardiology | Admitting: Cardiology

## 2013-05-14 DIAGNOSIS — E785 Hyperlipidemia, unspecified: Secondary | ICD-10-CM | POA: Insufficient documentation

## 2013-05-14 DIAGNOSIS — F17200 Nicotine dependence, unspecified, uncomplicated: Secondary | ICD-10-CM | POA: Insufficient documentation

## 2013-05-14 DIAGNOSIS — E669 Obesity, unspecified: Secondary | ICD-10-CM | POA: Insufficient documentation

## 2013-05-14 DIAGNOSIS — Z9861 Coronary angioplasty status: Secondary | ICD-10-CM | POA: Insufficient documentation

## 2013-05-14 DIAGNOSIS — R079 Chest pain, unspecified: Secondary | ICD-10-CM | POA: Diagnosis present

## 2013-05-14 DIAGNOSIS — I10 Essential (primary) hypertension: Secondary | ICD-10-CM

## 2013-05-14 DIAGNOSIS — Y929 Unspecified place or not applicable: Secondary | ICD-10-CM | POA: Insufficient documentation

## 2013-05-14 DIAGNOSIS — I2510 Atherosclerotic heart disease of native coronary artery without angina pectoris: Secondary | ICD-10-CM

## 2013-05-14 DIAGNOSIS — I472 Ventricular tachycardia, unspecified: Secondary | ICD-10-CM | POA: Insufficient documentation

## 2013-05-14 DIAGNOSIS — Z7901 Long term (current) use of anticoagulants: Secondary | ICD-10-CM | POA: Insufficient documentation

## 2013-05-14 DIAGNOSIS — I429 Cardiomyopathy, unspecified: Secondary | ICD-10-CM

## 2013-05-14 DIAGNOSIS — Z79899 Other long term (current) drug therapy: Secondary | ICD-10-CM | POA: Insufficient documentation

## 2013-05-14 DIAGNOSIS — I4729 Other ventricular tachycardia: Secondary | ICD-10-CM | POA: Insufficient documentation

## 2013-05-14 DIAGNOSIS — Y939 Activity, unspecified: Secondary | ICD-10-CM | POA: Insufficient documentation

## 2013-05-14 DIAGNOSIS — I4891 Unspecified atrial fibrillation: Secondary | ICD-10-CM | POA: Insufficient documentation

## 2013-05-14 DIAGNOSIS — Z888 Allergy status to other drugs, medicaments and biological substances status: Secondary | ICD-10-CM | POA: Insufficient documentation

## 2013-05-14 DIAGNOSIS — I5022 Chronic systolic (congestive) heart failure: Secondary | ICD-10-CM | POA: Insufficient documentation

## 2013-05-14 DIAGNOSIS — S298XXA Other specified injuries of thorax, initial encounter: Principal | ICD-10-CM | POA: Insufficient documentation

## 2013-05-14 DIAGNOSIS — I428 Other cardiomyopathies: Secondary | ICD-10-CM | POA: Insufficient documentation

## 2013-05-14 DIAGNOSIS — Z791 Long term (current) use of non-steroidal anti-inflammatories (NSAID): Secondary | ICD-10-CM | POA: Insufficient documentation

## 2013-05-14 DIAGNOSIS — M129 Arthropathy, unspecified: Secondary | ICD-10-CM | POA: Insufficient documentation

## 2013-05-14 DIAGNOSIS — R55 Syncope and collapse: Secondary | ICD-10-CM | POA: Insufficient documentation

## 2013-05-14 DIAGNOSIS — W1809XA Striking against other object with subsequent fall, initial encounter: Secondary | ICD-10-CM | POA: Insufficient documentation

## 2013-05-14 DIAGNOSIS — I499 Cardiac arrhythmia, unspecified: Secondary | ICD-10-CM | POA: Insufficient documentation

## 2013-05-14 HISTORY — DX: Unspecified osteoarthritis, unspecified site: M19.90

## 2013-05-14 HISTORY — DX: Cardiac arrhythmia, unspecified: I49.9

## 2013-05-14 HISTORY — DX: Shortness of breath: R06.02

## 2013-05-14 LAB — CBC WITH DIFFERENTIAL/PLATELET
BASOS ABS: 0 10*3/uL (ref 0.0–0.1)
BASOS ABS: 0 10*3/uL (ref 0.0–0.1)
Basophils Relative: 0 % (ref 0–1)
Basophils Relative: 0 % (ref 0–1)
Eosinophils Absolute: 0 10*3/uL (ref 0.0–0.7)
Eosinophils Absolute: 0 10*3/uL (ref 0.0–0.7)
Eosinophils Relative: 0 % (ref 0–5)
Eosinophils Relative: 0 % (ref 0–5)
HCT: 48.4 % (ref 39.0–52.0)
HCT: 46.7 % (ref 39.0–52.0)
Hemoglobin: 16.5 g/dL (ref 13.0–17.0)
Hemoglobin: 16.2 g/dL (ref 13.0–17.0)
LYMPHS ABS: 1.5 10*3/uL (ref 0.7–4.0)
LYMPHS PCT: 23 % (ref 12–46)
Lymphocytes Relative: 15 % (ref 12–46)
Lymphs Abs: 2.1 10*3/uL (ref 0.7–4.0)
MCH: 31.9 pg (ref 26.0–34.0)
MCH: 32.1 pg (ref 26.0–34.0)
MCHC: 34.1 g/dL (ref 30.0–36.0)
MCHC: 34.7 g/dL (ref 30.0–36.0)
MCV: 93.6 fL (ref 78.0–100.0)
MCV: 92.7 fL (ref 78.0–100.0)
Monocytes Absolute: 0.8 10*3/uL (ref 0.1–1.0)
Monocytes Absolute: 0.7 10*3/uL (ref 0.1–1.0)
Monocytes Relative: 8 % (ref 3–12)
Monocytes Relative: 8 % (ref 3–12)
NEUTROS ABS: 8.1 10*3/uL — AB (ref 1.7–7.7)
NEUTROS ABS: 6.2 10*3/uL (ref 1.7–7.7)
Neutrophils Relative %: 77 % (ref 43–77)
Neutrophils Relative %: 69 % (ref 43–77)
Platelets: 237 10*3/uL (ref 150–400)
Platelets: 219 10*3/uL (ref 150–400)
RBC: 5.17 MIL/uL (ref 4.22–5.81)
RBC: 5.04 MIL/uL (ref 4.22–5.81)
RDW: 14.4 % (ref 11.5–15.5)
RDW: 14.4 % (ref 11.5–15.5)
WBC: 10.4 10*3/uL (ref 4.0–10.5)
WBC: 8.9 10*3/uL (ref 4.0–10.5)

## 2013-05-14 LAB — HEMOGLOBIN A1C
Hgb A1c MFr Bld: 6 % — ABNORMAL HIGH (ref <5.7)
MEAN PLASMA GLUCOSE: 126 mg/dL — AB (ref <117)

## 2013-05-14 LAB — I-STAT TROPONIN, ED: Troponin i, poc: 0.01 ng/mL (ref 0.00–0.08)

## 2013-05-14 LAB — COMPREHENSIVE METABOLIC PANEL
ALT: 40 U/L (ref 0–53)
AST: 31 U/L (ref 0–37)
Albumin: 4 g/dL (ref 3.5–5.2)
Alkaline Phosphatase: 62 U/L (ref 39–117)
BUN: 14 mg/dL (ref 6–23)
CALCIUM: 9.6 mg/dL (ref 8.4–10.5)
CO2: 22 mEq/L (ref 19–32)
Chloride: 105 mEq/L (ref 96–112)
Creatinine, Ser: 0.87 mg/dL (ref 0.50–1.35)
GFR calc non Af Amer: >90 mL/min (ref >90)
Glucose, Bld: 125 mg/dL — ABNORMAL HIGH (ref 70–99)
POTASSIUM: 4.6 meq/L (ref 3.7–5.3)
SODIUM: 143 meq/L (ref 137–147)
TOTAL PROTEIN: 7.9 g/dL (ref 6.0–8.3)
Total Bilirubin: 0.8 mg/dL (ref 0.3–1.2)

## 2013-05-14 LAB — BASIC METABOLIC PANEL
BUN: 15 mg/dL (ref 6–23)
CO2: 25 mEq/L (ref 19–32)
Calcium: 9.7 mg/dL (ref 8.4–10.5)
Chloride: 106 mEq/L (ref 96–112)
Creatinine, Ser: 0.9 mg/dL (ref 0.50–1.35)
GFR calc Af Amer: >90 mL/min (ref >90)
GFR calc non Af Amer: >90 mL/min (ref >90)
GLUCOSE: 113 mg/dL — AB (ref 70–99)
POTASSIUM: 5.3 meq/L (ref 3.7–5.3)
SODIUM: 145 meq/L (ref 137–147)

## 2013-05-14 LAB — T4, FREE: Free T4: 1.22 ng/dL (ref 0.80–1.80)

## 2013-05-14 LAB — TROPONIN I

## 2013-05-14 LAB — APTT: aPTT: 39 seconds — ABNORMAL HIGH (ref 24–37)

## 2013-05-14 LAB — PRO B NATRIURETIC PEPTIDE: PRO B NATRI PEPTIDE: 1498 pg/mL — AB (ref 0–125)

## 2013-05-14 LAB — PROTIME-INR
INR: 1.55 — ABNORMAL HIGH (ref 0.00–1.49)
Prothrombin Time: 18.2 seconds — ABNORMAL HIGH (ref 11.6–15.2)

## 2013-05-14 LAB — TSH: TSH: 1.1 u[IU]/mL (ref 0.350–4.500)

## 2013-05-14 LAB — MAGNESIUM: MAGNESIUM: 2.4 mg/dL (ref 1.5–2.5)

## 2013-05-14 SURGERY — CANCELLED PROCEDURE

## 2013-05-14 MED ORDER — MAGNESIUM 400 MG PO CAPS: 1.0000 | ORAL_CAPSULE | Freq: Every day | ORAL | Status: DC

## 2013-05-14 MED ORDER — MELOXICAM 15 MG PO TABS
15.0000 mg | ORAL_TABLET | Freq: Every day | ORAL | Status: DC
  Administered 2013-05-14 – 2013-05-15 (×2): 15 mg via ORAL
  Filled 2013-05-14 (×2): qty 1

## 2013-05-14 MED ORDER — DOFETILIDE 250 MCG PO CAPS
375.0000 ug | ORAL_CAPSULE | Freq: Two times a day (BID) | ORAL | Status: DC
  Administered 2013-05-14 – 2013-05-15 (×2): 375 ug via ORAL
  Filled 2013-05-14 (×5): qty 1

## 2013-05-14 MED ORDER — B COMPLEX-C PO TABS
1.0000 | ORAL_TABLET | Freq: Every day | ORAL | Status: DC
  Administered 2013-05-14: 1 via ORAL
  Filled 2013-05-14 (×2): qty 1

## 2013-05-14 MED ORDER — NITROGLYCERIN 0.4 MG SL SUBL: 0.4000 mg | SUBLINGUAL_TABLET | SUBLINGUAL | Status: DC | PRN

## 2013-05-14 MED ORDER — SODIUM CHLORIDE 0.45 % IV SOLN: INTRAVENOUS | Status: DC

## 2013-05-14 MED ORDER — SODIUM CHLORIDE 0.9 % IV SOLN: 250.0000 mL | INTRAVENOUS | Status: DC

## 2013-05-14 MED ORDER — DOFETILIDE 250 MCG PO CAPS: 375.0000 ug | ORAL_CAPSULE | Freq: Two times a day (BID) | ORAL | Status: DC

## 2013-05-14 MED ORDER — ASPIRIN 300 MG RE SUPP: 300.0000 mg | RECTAL | Status: AC

## 2013-05-14 MED ORDER — MAGNESIUM OXIDE 400 (241.3 MG) MG PO TABS
400.0000 mg | ORAL_TABLET | Freq: Every day | ORAL | Status: DC
  Administered 2013-05-14 – 2013-05-15 (×2): 400 mg via ORAL
  Filled 2013-05-14 (×2): qty 1

## 2013-05-14 MED ORDER — ATORVASTATIN CALCIUM 40 MG PO TABS
40.0000 mg | ORAL_TABLET | Freq: Every day | ORAL | Status: DC
  Administered 2013-05-14: 40 mg via ORAL
  Filled 2013-05-14 (×2): qty 1

## 2013-05-14 MED ORDER — CARVEDILOL 12.5 MG PO TABS
12.5000 mg | ORAL_TABLET | Freq: Two times a day (BID) | ORAL | Status: DC
  Administered 2013-05-14 – 2013-05-15 (×2): 12.5 mg via ORAL
  Filled 2013-05-14 (×4): qty 1

## 2013-05-14 MED ORDER — LOSARTAN POTASSIUM 25 MG PO TABS
25.0000 mg | ORAL_TABLET | Freq: Every day | ORAL | Status: DC
  Administered 2013-05-14 – 2013-05-15 (×2): 25 mg via ORAL
  Filled 2013-05-14 (×2): qty 1

## 2013-05-14 MED ORDER — FUROSEMIDE 40 MG PO TABS
40.0000 mg | ORAL_TABLET | Freq: Every day | ORAL | Status: DC
  Administered 2013-05-14 – 2013-05-15 (×2): 40 mg via ORAL
  Filled 2013-05-14 (×2): qty 1

## 2013-05-14 MED ORDER — SODIUM CHLORIDE 0.9 % IJ SOLN: 3.0000 mL | INTRAMUSCULAR | Status: DC | PRN

## 2013-05-14 MED ORDER — RIVAROXABAN 20 MG PO TABS
20.0000 mg | ORAL_TABLET | Freq: Every day | ORAL | Status: DC
  Administered 2013-05-14: 20 mg via ORAL
  Filled 2013-05-14 (×2): qty 1

## 2013-05-14 MED ORDER — ASPIRIN EC 81 MG PO TBEC
81.0000 mg | DELAYED_RELEASE_TABLET | Freq: Every day | ORAL | Status: DC
  Administered 2013-05-15: 81 mg via ORAL
  Filled 2013-05-14: qty 1

## 2013-05-14 MED ORDER — MORPHINE SULFATE 4 MG/ML IJ SOLN
4.0000 mg | Freq: Once | INTRAMUSCULAR | Status: AC
  Administered 2013-05-14: 4 mg via INTRAVENOUS
  Filled 2013-05-14: qty 1

## 2013-05-14 MED ORDER — SODIUM CHLORIDE 0.9 % IJ SOLN
3.0000 mL | Freq: Two times a day (BID) | INTRAMUSCULAR | Status: DC
  Administered 2013-05-14 – 2013-05-15 (×2): 3 mL via INTRAVENOUS

## 2013-05-14 MED ORDER — POTASSIUM CHLORIDE CRYS ER 20 MEQ PO TBCR: 20.0000 meq | EXTENDED_RELEASE_TABLET | Freq: Every day | ORAL | Status: DC

## 2013-05-14 MED ORDER — ASPIRIN 81 MG PO CHEW: 324.0000 mg | CHEWABLE_TABLET | ORAL | Status: AC
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
## 2013-05-14 NOTE — H&P (Signed)
The cardioversion was cancelled and the patient was sent to the ER as he reports that he felt at home yesterday a thinks that he "broke couple robs" and was complaining of 9/10 chest pain.  We will follow and if no acute illness and ACS ruled out, we will schedule a cardioversion for tomorrow.

## 2013-05-14 NOTE — ED Provider Notes (Signed)
CSN: [id number]     Arrival date & time 05/14/13  0940 History   First MD Initiated Contact with Patient 05/14/13 [phone]     Chief Complaint  Patient presents with  . Fall  . Chest Pain  . Rib Injury     (Consider location/radiation/quality/duration/timing/severity/associated sxs/prior Treatment) Patient is a 58 y.o. male presenting with fall and chest pain.  Fall Associated symptoms include chest pain.  Chest Pain  58 yo male with hx of CHF, CAD, HTN, HLD, and A.fib with chronic anticoagulation presents with near syncope with fall, and subsequent rib and chest pain that occurred this morning prior to his arrival. Patient follows with Dr. Ena Dawley in cardiology for his A. Fib. Patient scheduled today for cardioversion but was sent to the ED after informing them of this mornings episode. Patient denies any LOC with todays episode. Patient Denies hitting his head. States he bent over to pick up some magazines and became dizzy upon standing. Patient states his leg gave out on him and he fell on a chair hitting his ribs. Patient then describes experiencing a sharp substernal CP lasting approx 8 min duration with associated with SOB. Patient also complaining of separate 10/10 sharp constant right rib pain worse with deep inspiration.  Denies any current dizziness, CP, or SOB. Patient admits to one episode of diarrhea this morning which is new for him, but was told this could be a side effect of the Tikosyn that he has recently started for his a.fib.  Past Medical History  Diagnosis Date  . Chronic systolic CHF (congestive heart failure)     a. Both ischemic and tachy induced. b. Echo 04/2013: EF 30-35%, no RWMA, normal RV, mod dilated LA (before restoration of NSR).  [name] CAD (coronary artery disease)     a. BMS 2.35mm 23 to LAD. CTO of RCA unsuccessful, good left to right collaterals - 2010.  [name] Atrial fibrillation     a. Dx 2010. b. Initiated on Tikosyn 03/2013.  [name] Chronic anticoagulation   .  NSVT (nonsustained ventricular tachycardia)   . Hyperlipidemia   . HTN (hypertension)   . Obesity   . Dysrhythmia     hx of atrial fibrilation  . Shortness of breath   . Arthritis    Past Surgical History  Procedure Laterality Date  . Coronary angioplasty with stent placement  2010    BMS 2.89mm 23 to LAD. CTO of RCA unsuccessful, good left to right collaterals  . Cardioversion N/A 04/27/2013    Procedure: BEDSIDE CARDIOVERSION;  Surgeon: Sanda Klein, MD;  Location: [place];  Service: Cardiovascular;  Laterality: N/A;  . Foot surgery Right    Family History  Problem Relation Age of Onset  . Other Father     Car accident  . Coronary artery disease Neg Hx    History  Substance Use Topics  . Smoking status: Light Tobacco Smoker    Types: Cigars  . Smokeless tobacco: Never Used     Comment: 1 cigar per month  . Alcohol Use: No    Review of Systems  Cardiovascular: Positive for chest pain.  All other systems reviewed and are negative.     Allergies  Lisinopril  Home Medications   Prior to Admission medications   Medication Sig Start Date End Date Taking? Authorizing Provider  atorvastatin (LIPITOR) 40 MG tablet Take 1 tablet (40 mg total) by mouth daily. 01/04/13   [name], MD  B Complex-C (B-COMPLEX WITH VITAMIN C) tablet Take  1 tablet by mouth daily.    Historical Provider, MD  carvedilol (COREG) 12.5 MG tablet Take 1 tablet (12.5 mg total) by mouth 2 (two) times daily with a meal. 04/28/13   Dayna N Dunn, PA-C  Coenzyme Q10 (CO Q-10) 200 MG CAPS Take 1 capsule by mouth daily.    Historical Provider, MD  dofetilide (TIKOSYN) 125 MCG capsule Take 3 capsules (375 mcg total) by mouth 2 (two) times daily. 04/28/13   Dayna N Dunn, PA-C  fexofenadine (ALLEGRA) 180 MG tablet Take 180 mg by mouth daily.    Historical Provider, MD  furosemide (LASIX) 40 MG tablet Take 40 mg by mouth daily.     Historical Provider, MD  LevOCARNitine (CARNITINE PO) Take 500 mg by mouth daily.     Historical Provider, MD  losartan (COZAAR) 50 MG tablet Take 25 mg by mouth daily.    Historical Provider, MD  Magnesium 400 MG CAPS Take 1 capsule by mouth daily.     Historical Provider, MD  meloxicam (MOBIC) 15 MG tablet Take 15 mg by mouth daily.    Historical Provider, MD  Multiple Vitamin (MULTIVITAMIN) tablet Take 1 tablet by mouth daily.    Historical Provider, MD  nitroGLYCERIN (NITROSTAT) 0.4 MG SL tablet Place 0.4 mg under the tongue every 5 (five) minutes as needed for chest pain.    Historical Provider, MD  NON FORMULARY (Ribose) Take 1 capsule once a day    Historical Provider, MD  Omega-3 Fatty Acids (OMEGA 3 PO) Take 2 capsules by mouth daily.     Historical Provider, MD  potassium chloride SA (KLOR-CON M20) 20 MEQ tablet Take 1 tablet (20 mEq total) by mouth daily. 02/16/13   [name], MD  Rivaroxaban (XARELTO) 20 MG TABS tablet Take 1 tablet (20 mg total) by mouth daily with supper. 03/29/13   Evans Lance, MD  vitamin C (ASCORBIC ACID) 500 MG tablet Take 500 mg by mouth daily.    Historical Provider, MD  vitamin E 400 UNIT capsule Take 400 Units by mouth daily.    Historical Provider, MD  zinc gluconate 50 MG tablet Take 25 mg by mouth daily.     Historical Provider, MD   BP 128/78  Pulse 71  Temp(Src) 97.8 F (36.6 C) (Oral)  Resp 20  Ht 5\' 7"  (1.702 m)  Wt 274 lb 6.8 oz (124.478 kg)  BMI 42.97 kg/m2  SpO2 95% Physical Exam  Nursing note and vitals reviewed. Constitutional: He is oriented to person, place, and time. He appears well-developed and well-nourished. No distress.  HENT:  Head: Normocephalic and atraumatic.  Eyes: Conjunctivae are normal. No scleral icterus.  Neck: No JVD present. No tracheal deviation present.  Cardiovascular: Normal rate.  An irregularly irregular rhythm present. Exam reveals no gallop and no friction rub.   No murmur heard. Pulmonary/Chest: Effort normal and breath sounds normal. No respiratory distress. He has no wheezes. He has no  rhonchi. He has no rales.    Right sided chest wall pain with palpation. No obvious swelling, bruising, abrasions, or erythema.   Abdominal: Soft. Bowel sounds are normal. He exhibits no distension. There is no hepatosplenomegaly. There is no tenderness. There is no rigidity, no rebound, no guarding, no tenderness at McBurney's point and negative Murphy's sign.  Musculoskeletal: He exhibits no edema.  Neurological: He is alert and oriented to person, place, and time.  Skin: Skin is warm and dry. He is not diaphoretic.  Psychiatric: He has a normal  mood and affect. His behavior is normal.    ED Course  Procedures (including critical care time) Labs Review Labs Reviewed  CBC WITH DIFFERENTIAL - Abnormal; Notable for the following:    Neutro Abs 8.1 (*)    All other components within normal limits  BASIC METABOLIC PANEL - Abnormal; Notable for the following:    Glucose, Bld 113 (*)    All other components within normal limits  PROTIME-INR - Abnormal; Notable for the following:    Prothrombin Time 18.2 (*)    INR 1.55 (*)    All other components within normal limits  APTT - Abnormal; Notable for the following:    aPTT 39 (*)    All other components within normal limits  COMPREHENSIVE METABOLIC PANEL - Abnormal; Notable for the following:    Glucose, Bld 125 (*)    All other components within normal limits  PRO B NATRIURETIC PEPTIDE - Abnormal; Notable for the following:    Pro B Natriuretic peptide (BNP) 1498.0 (*)    All other components within normal limits  HEMOGLOBIN A1C - Abnormal; Notable for the following:    Hemoglobin A1C 6.0 (*)    Mean Plasma Glucose 126 (*)    All other components within normal limits  LIPID PANEL - Abnormal; Notable for the following:    Triglycerides 206 (*)    VLDL 41 (*)    LDL Cholesterol 107 (*)    All other components within normal limits  BASIC METABOLIC PANEL - Abnormal; Notable for the following:    Glucose, Bld 150 (*)    All other  components within normal limits  BASIC METABOLIC PANEL - Abnormal; Notable for the following:    Glucose, Bld 103 (*)    GFR calc non Af Amer 77 (*)    GFR calc Af Amer 89 (*)    All other components within normal limits  MAGNESIUM  TROPONIN I  TROPONIN I  TROPONIN I  CBC WITH DIFFERENTIAL  TSH  T4, FREE  I-STAT TROPOININ, ED  I-STAT TROPOININ, ED    Imaging Review Dg Chest 2 View  05/14/2013   CLINICAL DATA:  Right posterior rib pain post fall  EXAM: CHEST  2 VIEW  COMPARISON:  11/08/2008  FINDINGS: Enlargement of cardiac silhouette.  Mediastinal contours and pulmonary vascularity normal.  Lungs clear.  No pleural effusion or pneumothorax.  No definite osseous abnormalities identified.  IMPRESSION: Enlargement of cardiac silhouette.  No acute abnormalities.   Electronically Signed   By: Lavonia Dana M.D.   On: 05/14/2013 10:46   Dg Ribs Unilateral Right  05/14/2013   CLINICAL DATA:  Right posterior rib pain post fall  EXAM: RIGHT RIBS - 2 VIEW  COMPARISON:  Chest radiographs 11/08/2008 and 05/14/2013  FINDINGS: Rounded calcifications right upper quadrant likely gallstones.  Osseous mineralization slightly decreased.  No definite rib fracture or bone destruction identified.  IMPRESSION: No definite right rib fracture seen.  Cholelithiasis.   Electronically Signed   By: Lavonia Dana M.D.   On: 05/14/2013 10:48     EKG Interpretation   Date/Time:  Monday May 14 2013 09:58:56 EDT Ventricular Rate:  107 PR Interval:    QRS Duration: 92 QT Interval:  370 QTC Calculation: 494 R Axis:   -10 Text Interpretation:  Atrial fibrillation Inferior infarct, old Baseline  wander in lead(s) V6 No significant change since last tracing Confirmed by  ALLEN  MD, ANTHONY ([id number]) on 05/14/2013 10:15:28 AM      MDM  Final diagnoses:  Atrial fibrillation  Chest pain  CAD (coronary artery disease)  Cardiomyopathy  HTN (hypertension)   Patient presents today after fall with subsequent chest  and rib pain.  Patient afebrile. EKG shows a.fib without RVR. Not new for him Patient appears in NAD, just complains of right rib pain.  Troponin negative will get delta trop CXR enlarged cardiac silhouette otherwise negative.  Plain films of Right ribs show no acute fracture, though gall stones present. Appears incidental as patient is asymptomatic at this time for biliary disease.  CBC wnl  BMP wnl  Plan to treat patient's pain and check a delta troponin. Patient discussed with Dr. Zenia Resides. Cardiology consulted. Plan to admit patient for further management of A.fib and observation of chest pain.       Sherrie George, PA-C 05/15/13 1559

## 2013-05-14 NOTE — Progress Notes (Signed)
Patient admitted to Endoscopy unit. Patient states he feels like he may have had a heart attack and fell this am. He decided not to call 911 due to having a cardioversion scheduled this am. Spoke with cardiologist and ordered EKG. Upon results of EKG MD decided to have patient moved to ER for xrays and to be assessed. Report given to charge RN and bedside RN. VSS, patient in afib with RVR. A&Ox4. Patient aware of situation and agreeable.

## 2013-05-14 NOTE — ED Provider Notes (Signed)
Medical screening examination/treatment/procedure(s) were conducted as a shared visit with non-physician practitioner(s) and myself.  I personally evaluated the patient during the encounter.   EKG Interpretation   Date/Time:  Monday May 14 2013 09:58:56 EDT Ventricular Rate:  107 PR Interval:    QRS Duration: 92 QT Interval:  370 QTC Calculation: 494 R Axis:   -10 Text Interpretation:  Atrial fibrillation Inferior infarct, old Baseline  wander in lead(s) V6 No significant change since last tracing Confirmed by  Daleiza Bacchi  MD, Edwin Baines ([id number]) on 05/14/2013 10:15:28 AM     Patient here complaining of right-sided rib pain that occurred after he fell. Patient also notes substernal chest pain last for approximately 8 minutes with associated dyspnea which is in a different location from where he fell. He is currently asymptomatic with regards to the substernal chest pain. Patient's EKG does not show any signs of STEMI. Will consult cardiology. We'll also order x-rays for evaluation of possible right-sided rib fractures  Leota Jacobsen, MD 05/14/13 1016

## 2013-05-14 NOTE — ED Notes (Signed)
Pt scheduled for Cardioverson today in Endoscopy, reports bending over this morning to grab a stack of magazines in a bag and got dizzy fell on his right side. At that point prior to fall had chest pain in the center of the chest. Denies LOC but reports dizziness and diarrhea. Pt came to Endo instead of calling 911 due to the appointment that was already scheduled. EKG from Endo Unremarkable. Pt A/O x3 no chest pain currently, Rib pain 10/10.

## 2013-05-14 NOTE — ED Notes (Signed)
MD at bedside. 

## 2013-05-15 ENCOUNTER — Encounter (HOSPITAL_COMMUNITY): Payer: Self-pay | Admitting: Anesthesiology

## 2013-05-15 ENCOUNTER — Encounter (HOSPITAL_COMMUNITY)
Admission: EM | Disposition: A | Discharge status: Home / Self Care | Payer: Self-pay | Source: Home / Self Care | Attending: Emergency Medicine

## 2013-05-15 ENCOUNTER — Encounter (HOSPITAL_COMMUNITY): Payer: Medicare Other | Admitting: Anesthesiology

## 2013-05-15 ENCOUNTER — Observation Stay (HOSPITAL_COMMUNITY): Payer: Medicare Other | Admitting: Anesthesiology

## 2013-05-15 DIAGNOSIS — I2510 Atherosclerotic heart disease of native coronary artery without angina pectoris: Secondary | ICD-10-CM

## 2013-05-15 DIAGNOSIS — I428 Other cardiomyopathies: Secondary | ICD-10-CM

## 2013-05-15 DIAGNOSIS — I4891 Unspecified atrial fibrillation: Secondary | ICD-10-CM

## 2013-05-15 HISTORY — PX: CARDIOVERSION: SHX1299

## 2013-05-15 LAB — BASIC METABOLIC PANEL
BUN: 16 mg/dL (ref 6–23)
BUN: 15 mg/dL (ref 6–23)
CALCIUM: 9.5 mg/dL (ref 8.4–10.5)
CHLORIDE: 100 meq/L (ref 96–112)
CHLORIDE: 102 meq/L (ref 96–112)
CO2: 22 meq/L (ref 19–32)
CO2: 23 mEq/L (ref 19–32)
CREATININE: 0.84 mg/dL (ref 0.50–1.35)
Calcium: 8.9 mg/dL (ref 8.4–10.5)
Creatinine, Ser: 1.03 mg/dL (ref 0.50–1.35)
GFR calc Af Amer: 89 mL/min — ABNORMAL LOW (ref >90)
GFR calc non Af Amer: 77 mL/min — ABNORMAL LOW (ref >90)
GFR calc non Af Amer: >90 mL/min (ref >90)
GLUCOSE: 103 mg/dL — AB (ref 70–99)
GLUCOSE: 150 mg/dL — AB (ref 70–99)
POTASSIUM: 3.8 meq/L (ref 3.7–5.3)
Potassium: 4.2 mEq/L (ref 3.7–5.3)
Sodium: 139 mEq/L (ref 137–147)
Sodium: 138 mEq/L (ref 137–147)

## 2013-05-15 LAB — LIPID PANEL
CHOLESTEROL: 193 mg/dL (ref 0–200)
HDL: 45 mg/dL (ref >39)
LDL Cholesterol: 107 mg/dL — ABNORMAL HIGH (ref 0–99)
TRIGLYCERIDES: 206 mg/dL — AB (ref <150)
Total CHOL/HDL Ratio: 4.3 RATIO
VLDL: 41 mg/dL — ABNORMAL HIGH (ref 0–40)

## 2013-05-15 LAB — TROPONIN I

## 2013-05-15 SURGERY — CARDIOVERSION
Anesthesia: General

## 2013-05-15 MED ORDER — PROPOFOL 10 MG/ML IV BOLUS
INTRAVENOUS | Status: AC
  Filled 2013-05-15: qty 20

## 2013-05-15 MED ORDER — LIDOCAINE HCL (CARDIAC) 20 MG/ML IV SOLN
INTRAVENOUS | Status: AC
  Filled 2013-05-15: qty 5

## 2013-05-15 MED ORDER — LIDOCAINE HCL (CARDIAC) 20 MG/ML IV SOLN
INTRAVENOUS | Status: DC | PRN
  Administered 2013-05-15: 40 mg via INTRAVENOUS

## 2013-05-15 MED ORDER — ASPIRIN 81 MG PO TBEC: 81.0000 mg | DELAYED_RELEASE_TABLET | Freq: Every day | ORAL | Status: DC

## 2013-05-15 MED ORDER — PROPOFOL 10 MG/ML IV BOLUS
INTRAVENOUS | Status: DC | PRN
  Administered 2013-05-15: 70 mg via INTRAVENOUS

## 2013-05-15 NOTE — Anesthesia Postprocedure Evaluation (Signed)
  Anesthesia Post-op Note  Patient: Norman Rodriguez  Procedure(s) Performed: Procedure(s): CARDIOVERSION/BEDSIDE (N/A)  Patient Location: PACU and Nursing Unit  Anesthesia Type:General  Level of Consciousness: awake, alert , oriented and patient cooperative  Airway and Oxygen Therapy: Patient Spontanous Breathing  Post-op Pain: none  Post-op Assessment: Post-op Vital signs reviewed, Patient's Cardiovascular Status Stable, Respiratory Function Stable, Patent Airway and No signs of Nausea or vomiting  Post-op Vital Signs: stable  Last Vitals:  Filed Vitals:   05/15/13 0439  BP: 105/70  Pulse: 82  Temp: 36.9 C  Resp: 20    Complications: No apparent anesthesia complications
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
## 2013-05-15 NOTE — Interval H&P Note (Signed)
History and Physical Interval Note:  05/15/2013 12:04 PM  Norman Rodriguez  has presented today for surgery, with the diagnosis of AFIB  The various methods of treatment have been discussed with the patient and family. After consideration of risks, benefits and other options for treatment, the patient has consented to  Procedure(s): CARDIOVERSION/BEDSIDE (N/A) as a surgical intervention .  The patient's history has been reviewed, patient examined, no change in status, stable for surgery.  I have reviewed the patient's chart and labs.  Questions were answered to the patient's satisfaction.     Dalton Claris Gladden

## 2013-05-15 NOTE — Transfer of Care (Signed)
Immediate Anesthesia Transfer of Care Note  Patient: Norman Rodriguez  Procedure(s) Performed: Procedure(s): CARDIOVERSION/BEDSIDE (N/A)  Patient Location: Nursing Unit  Anesthesia Type:MAC  Level of Consciousness: awake and alert   Airway & Oxygen Therapy: Patient Spontanous Breathing and Patient connected to nasal cannula oxygen  Post-op Assessment: Report given to PACU RN and Post -op Vital signs reviewed and stable  Post vital signs: Reviewed and stable  Complications: No apparent anesthesia complications

## 2013-05-15 NOTE — Discharge Summary (Signed)
Physician Discharge Summary     Patient ID: Norman Rodriguez MRN: [id number] DOB/AGE: 1955-03-17 58 y.o.  Cardiologist: Marlou Porch  Admit date: 05/14/2013 Discharge date: 05/15/2013  Admission Diagnoses:  Chest pain  Discharge Diagnoses:  Active Problems:   Chest pain   atrial fibrillation    Coronary artery disease   Cardiomyopathy ejection fraction 29-52%   Chronic systolic heart failure   Hypertension   Anemia   NSVT   Obesity  Discharged Condition: stable  Hospital Course:  This 58 year old gentleman came to the hospital today for a previously scheduled outpatient cardioversion. Prior to his arrival he had severe substernal chest pain and was brought to the emergency room.  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  Patient was admitted for observation. He ruled out for MI.  He was continued on Tikosyn. He underwent successful DC CV on 05/15/2013.  QTC was approximately 490 ms.  CXR x-ray showed no definite rib fracture but there was clear cholelithiasis. F/U with Dr. Lovena Le and Dr. Marlou Porch to review ICD implantation as primary prevention for ischemic cardiomyopathy, EF 30%.  His event from yesterday may or may not be related to ventricular arrhythmia, but no indication of such an event while in the hospital. Symptoms were more likely musculoskeletal. The patient was seen by Dr. Sallyanne Kuster who felt he was stable for DC home.  Followup has been arranged.   Consults: None  Significant Diagnostic Studies:  RIGHT RIBS - 2 VIEW  COMPARISON: Chest radiographs 11/08/2008 and 05/14/2013  FINDINGS: Rounded calcifications right upper quadrant likely gallstones.  Osseous mineralization slightly decreased.  No definite rib  fracture or bone destruction identified.  IMPRESSION: No definite right rib fracture seen.  Cholelithiasis.   Electronically Signed  Cardiac Panel (last 3 results)  Recent Labs  05/14/13 1358 05/14/13 1947 05/15/13 0536  TROPONINI <0.30 <0.30 <0.30   Lipid Panel     Component Value Date/Time   CHOL 193 05/15/2013 0118   TRIG 206* 05/15/2013 0118   HDL 45 05/15/2013 0118   CHOLHDL 4.3 05/15/2013 0118   VLDL 41* 05/15/2013 0118   LDLCALC 107* 05/15/2013 0118    Treatments: See above  Discharge Exam: Blood pressure 105/70, pulse 82, temperature 98.4 F (36.9 C), temperature source Oral, resp. rate 20, height 5\' 7"  (1.702 m), weight 274 lb 6.8 oz (124.478 kg), SpO2 96.00%.   Disposition: 30-Still a Patient  Discharge Orders   Future Appointments Provider Department Dept Phone   05/31/2013 2:00 PM Andrez Grime, PA-C [facility] [phone]   Future Orders Complete By Expires   Diet - low sodium heart healthy  As directed    Increase activity slowly  As directed        Medication List    STOP taking these medications       DIGOX 0.125 MG tablet  Generic drug:  digoxin     warfarin 7.5 MG tablet  Commonly known as:  COUMADIN      TAKE these medications       aspirin 81 MG EC tablet  Take 1 tablet (81 mg total) by mouth daily.     atorvastatin 40 MG tablet  Commonly known as:  LIPITOR  Take 1 tablet (40 mg total) by mouth daily.     B-complex with  vitamin C tablet  Take 1 tablet by mouth daily.     CARNITINE PO  Take 500 mg by mouth daily.     carvedilol 12.5 MG tablet  Commonly known as:  COREG  Take 1 tablet (12.5 mg total) by mouth 2 (two) times daily with a meal.     Co Q-10 200 MG Caps  Take 1 capsule by mouth daily.     dofetilide 125 MCG capsule  Commonly known as:  TIKOSYN  Take 3 capsules (375 mcg total) by mouth 2 (two) times daily.     fexofenadine 180 MG tablet  Commonly known as:  ALLEGRA  Take 180 mg by  mouth daily.     furosemide 40 MG tablet  Commonly known as:  LASIX  Take 40 mg by mouth daily.     losartan 50 MG tablet  Commonly known as:  COZAAR  Take 25 mg by mouth daily.     Magnesium 400 MG Caps  Take 1 capsule by mouth daily.     meloxicam 15 MG tablet  Commonly known as:  MOBIC  Take 15 mg by mouth daily.     multivitamin tablet  Take 1 tablet by mouth daily.     nitroGLYCERIN 0.4 MG SL tablet  Commonly known as:  NITROSTAT  Place 0.4 mg under the tongue every 5 (five) minutes as needed for chest pain.     NON FORMULARY  (Ribose) Take 1 capsule once a day     OMEGA 3 PO  Take 2 capsules by mouth daily.     potassium chloride SA 20 MEQ tablet  Commonly known as:  KLOR-CON M20  Take 1 tablet (20 mEq total) by mouth daily.     rivaroxaban 20 MG Tabs tablet  Commonly known as:  XARELTO  Take 1 tablet (20 mg total) by mouth daily with supper.     vitamin C 500 MG tablet  Commonly known as:  ASCORBIC ACID  Take 500 mg by mouth daily.     vitamin E 400 UNIT capsule  Take 400 Units by mouth daily.     zinc gluconate 50 MG tablet  Take 25 mg by mouth daily.           Follow-up Information   Follow up with Ileene Hutchinson, PA-C On 05/31/2013. (2:00PM)    Specialty:  Cardiology   Contact information:   [address] [place] [address] [phone]       Signed: Tarri Fuller 05/15/2013, 1:11 PM

## 2013-05-15 NOTE — Procedures (Signed)
Electrical Cardioversion Procedure Note Norman Rodriguez [id number] [birth year]  Procedure: Electrical Cardioversion Indications:  Atrial Fibrillation  Procedure Details Consent: Risks of procedure as well as the alternatives and risks of each were explained to the (patient/caregiver).  Consent for procedure obtained. Time Out: Verified patient identification, verified procedure, site/side was marked, verified correct patient position, special equipment/implants available, medications/allergies/relevent history reviewed, required imaging and test results available.  Performed  Patient placed on cardiac monitor, pulse oximetry, supplemental oxygen as necessary.  Sedation given: Propofol per anesthesiology. Pacer pads placed anterior and posterior chest.  Cardioverted 1 time(s).  Cardioverted at [place].  Evaluation Findings: Post procedure EKG shows: NSR Complications: None Patient did tolerate procedure well.   Larey Dresser 05/15/2013, 12:09 PM

## 2013-05-15 NOTE — Anesthesia Preprocedure Evaluation (Addendum)
Anesthesia Evaluation  Patient identified by MRN, date of birth, ID band Patient awake    Reviewed: Allergy & Precautions, H&P , NPO status   History of Anesthesia Complications Negative for: history of anesthetic complications  Airway Mallampati: III TM Distance: >3 FB Neck ROM: Full    Dental  (+) Teeth Intact, Dental Advisory Given   Pulmonary Current Smoker,          Cardiovascular hypertension, + CAD, + Cardiac Stents and +CHF + dysrhythmias Atrial Fibrillation     Neuro/Psych negative neurological ROS  negative psych ROS   GI/Hepatic negative GI ROS, Neg liver ROS,   Endo/Other  negative endocrine ROS  Renal/GU negative Renal ROS     Musculoskeletal   Abdominal   Peds  Hematology   Anesthesia Other Findings   Reproductive/Obstetrics                         Anesthesia Physical Anesthesia Plan  ASA: III  Anesthesia Plan: General   Post-op Pain Management:    Induction: Intravenous  Airway Management Planned: Mask  Additional Equipment:   Intra-op Plan:   Post-operative Plan:   Informed Consent:   Dental advisory given  Plan Discussed with: CRNA, Anesthesiologist and Surgeon  Anesthesia Plan Comments:        Anesthesia Quick Evaluation

## 2013-05-17 ENCOUNTER — Encounter (HOSPITAL_COMMUNITY): Payer: Self-pay | Admitting: Cardiology

## 2013-05-18 NOTE — ED Provider Notes (Signed)
Medical screening examination/treatment/procedure(s) were conducted as a shared visit with non-physician practitioner(s) and myself.  I personally evaluated the patient during the encounter.   EKG Interpretation   Date/Time:  Monday May 14 2013 09:58:56 EDT Ventricular Rate:  107 PR Interval:    QRS Duration: 92 QT Interval:  370 QTC Calculation: 494 R Axis:   -10 Text Interpretation:  Atrial fibrillation Inferior infarct, old Baseline  wander in lead(s) V6 No significant change since last tracing Confirmed by  Zenia Resides  MD, Aarti Mankowski ([id number]) on 05/14/2013 10:15:28 AM       Leota Jacobsen, MD 05/18/13 1421

## 2013-05-20 ENCOUNTER — Other Ambulatory Visit: Payer: Self-pay | Admitting: Cardiology

## 2013-05-31 ENCOUNTER — Encounter: Payer: Medicare Other | Admitting: Cardiology

## 2013-05-31 ENCOUNTER — Ambulatory Visit (INDEPENDENT_AMBULATORY_CARE_PROVIDER_SITE_OTHER): Payer: Medicare Other | Admitting: Cardiology

## 2013-05-31 ENCOUNTER — Encounter: Payer: Self-pay | Admitting: Cardiology

## 2013-05-31 VITALS — BP 124/70 | HR 64 | Wt 273.0 lb

## 2013-05-31 DIAGNOSIS — Z5181 Encounter for therapeutic drug level monitoring: Secondary | ICD-10-CM | POA: Diagnosis not present

## 2013-05-31 DIAGNOSIS — I4891 Unspecified atrial fibrillation: Secondary | ICD-10-CM | POA: Diagnosis not present

## 2013-05-31 DIAGNOSIS — R9431 Abnormal electrocardiogram [ECG] [EKG]: Secondary | ICD-10-CM | POA: Diagnosis not present

## 2013-05-31 DIAGNOSIS — Z79899 Other long term (current) drug therapy: Secondary | ICD-10-CM

## 2013-05-31 DIAGNOSIS — I2510 Atherosclerotic heart disease of native coronary artery without angina pectoris: Secondary | ICD-10-CM

## 2013-05-31 LAB — BASIC METABOLIC PANEL
BUN: 11 mg/dL (ref 6–23)
CHLORIDE: 102 meq/L (ref 96–112)
CO2: 28 mEq/L (ref 19–32)
Calcium: 9.3 mg/dL (ref 8.4–10.5)
Creatinine, Ser: 0.9 mg/dL (ref 0.4–1.5)
GFR: 88.04 mL/min (ref >60.00)
GLUCOSE: 107 mg/dL — AB (ref 70–99)
POTASSIUM: 4.4 meq/L (ref 3.5–5.1)
Sodium: 137 mEq/L (ref 135–145)

## 2013-05-31 LAB — MAGNESIUM: MAGNESIUM: 2.2 mg/dL (ref 1.5–2.5)

## 2013-05-31 MED ORDER — DOFETILIDE 250 MCG PO CAPS: 250.0000 ug | ORAL_CAPSULE | Freq: Two times a day (BID) | ORAL | Status: DC

## 2013-05-31 NOTE — Progress Notes (Signed)
ELECTROPHYSIOLOGY OFFICE NOTE   Patient ID: Norman Rodriguez MRN: [id number], DOB/AGE: 1955-07-29   Date of Visit: 05/31/2013  Primary Physician: No PCP Per Patient Primary Cardiologist: Norman Porch, MD Primary EP: Lovena Le, MD Reason for Visit: Hospital follow-up  History of Present Illness  Norman Rodriguez is a 58 y.o. 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 He was recently referred to EP in the outpatient setting for consideration of primary prevention ICD implant. Recommendations were to pursue rhythm control in hopes that his cardiomyopathy would improve. Therefore, he was admitted for Tikosyn loading. 2D echo during that admission - EF 30-35%, no RWMA, normal RV, mod dilated LA. Tikosyn dose was decreased due to QT prolongation. He underwent DCCV on 04/27/2013) which was successful for restoring SR. He was discharged on 04/28/2013. On 05/04/2013 he was found to have recurrent atrial fibrillation. He was scheduled for outpatient DCCV and on the day of his procedure he experienced a fall and subsequent chest pain. He was admitted for observation 05/14/2013. MI ruled out with serial negative troponin. He was not volume overloaded. He was continued on Tikosyn and underwent DCCV which was successful for restoring SR on 05/15/2013. QTc on discharge was 490.   Today, he presents for Tikosyn followup. Since discharge, he reports he is doing well and has no complaints. He denies chest pain or shortness of breath. He denies palpitations, dizziness, near syncope or syncope. He denies LE swelling, orthopnea or PND. He is compliant with medications.  Past Medical History Past Medical History  Diagnosis Date  . Chronic systolic CHF (congestive heart failure)     a. Both ischemic and tachy induced. b. Echo 04/2013: EF 30-35%, no RWMA, normal RV, mod dilated LA (before restoration of NSR).  [name] CAD (coronary artery  disease)     a. BMS 2.35mm 23 to LAD. CTO of RCA unsuccessful, good left to right collaterals - 2010.  [name] Atrial fibrillation     a. Dx 2010. b. Initiated on Tikosyn 03/2013.  [name] Chronic anticoagulation   . NSVT (nonsustained ventricular tachycardia)   . Hyperlipidemia   . HTN (hypertension)   . Obesity   . Dysrhythmia     hx of atrial fibrilation  . Shortness of breath   . Arthritis     Past Surgical History Past Surgical History  Procedure Laterality Date  . Coronary angioplasty with stent placement  2010    BMS 2.3mm 23 to LAD. CTO of RCA unsuccessful, good left to right collaterals  . Cardioversion N/A 04/27/2013    Procedure: BEDSIDE CARDIOVERSION;  Surgeon: Sanda Klein, MD;  Location: [facility];  Service: Cardiovascular;  Laterality: N/A;  . Foot surgery Right   . Cardioversion N/A 05/15/2013    Procedure: CARDIOVERSION/BEDSIDE;  Surgeon: Larey Dresser, MD;  [facility];  Service: Cardiovascular;  Laterality: N/A;    Allergies/Intolerances Allergies  Allergen Reactions  . Lisinopril Cough    Current Home Medications Current Outpatient Prescriptions  Medication Sig Dispense Refill  . aspirin EC 81 MG EC tablet Take 1 tablet (81 mg total) by mouth daily.      [name] atorvastatin (LIPITOR) 40 MG tablet Take 1 tablet (40 mg total) by mouth daily.  30 tablet  6  . B Complex-C (B-COMPLEX WITH VITAMIN C) tablet Take 1 tablet by mouth daily.      . carvedilol (COREG) 12.5 MG tablet Take 1 tablet (12.5 mg total) by mouth 2 (two)  times daily with a meal.  60 tablet  3  . Coenzyme Q10 (CO Q-10) 200 MG CAPS Take 1 capsule by mouth daily.      [name] dofetilide (TIKOSYN) 125 MCG capsule Take 3 capsules (375 mcg total) by mouth 2 (two) times daily.      . fexofenadine (ALLEGRA) 180 MG tablet Take 180 mg by mouth daily.      . furosemide (LASIX) 40 MG tablet Take 40 mg by mouth daily.       . LevOCARNitine (CARNITINE PO) Take 500 mg by mouth daily.      [name] losartan (COZAAR) 50 MG tablet Take 25  mg by mouth daily.      . Magnesium 400 MG CAPS Take 1 capsule by mouth daily.       . meloxicam (MOBIC) 15 MG tablet Take 15 mg by mouth daily.      . Multiple Vitamin (MULTIVITAMIN) tablet Take 1 tablet by mouth daily.      . nitroGLYCERIN (NITROSTAT) 0.4 MG SL tablet Place 0.4 mg under the tongue every 5 (five) minutes as needed for chest pain.      . NON FORMULARY (Ribose) Take 1 capsule once a day      . NON FORMULARY Glycine viatmin 1 tab po qd      . Omega-3 Fatty Acids (OMEGA 3 PO) Take 2 capsules by mouth daily.       . potassium chloride SA (K-DUR,KLOR-CON) 20 MEQ tablet TAKE 1 TABLET BY MOUTH EVERY DAY  30 tablet  0  . Rivaroxaban (XARELTO) 20 MG TABS tablet Take 1 tablet (20 mg total) by mouth daily with supper.  30 tablet  11  . vitamin C (ASCORBIC ACID) 500 MG tablet Take 500 mg by mouth daily.      . vitamin E 400 UNIT capsule Take 400 Units by mouth daily.      [name] zinc gluconate 50 MG tablet Take 25 mg by mouth daily.        No current facility-administered medications for this visit.    Social History History   Social History  . Marital Status: Single    Spouse Name: N/A    Number of Children: N/A  . Years of Education: N/A   Occupational History  . Not on file.   Social History Main Topics  . Smoking status: Light Tobacco Smoker    Types: Cigars  . Smokeless tobacco: Never Used     Comment: 1 cigar per month  . Alcohol Use: No  . Drug Use: No     Comment: 1 per month...  . Sexual Activity: Not on file   Other Topics Concern  . Not on file   Social History Narrative  . No narrative on file     Review of Systems General: No chills, fever, night sweats or weight changes Cardiovascular: No chest pain, dyspnea on exertion, edema, orthopnea, palpitations, paroxysmal nocturnal dyspnea Dermatological: No rash, lesions or masses Respiratory: No cough, dyspnea Urologic: No hematuria, dysuria Abdominal: No nausea, vomiting, diarrhea, bright red blood per  rectum, melena, or hematemesis Neurologic: No visual changes, weakness, changes in mental status All other systems reviewed and are otherwise negative except as noted above.  Physical Exam Vitals: Blood pressure 124/70, pulse 64, weight 273 lb (123.832 kg).  General: Well developed, well appearing 58 y.o. male in no acute distress. HEENT: Normocephalic, atraumatic. EOMs intact. Sclera nonicteric. Oropharynx clear.  Neck: Supple. No JVD. Lungs: Respirations regular and unlabored, CTA  bilaterally. No wheezes, rales or rhonchi. Heart: RRR. S1, S2 present. No murmurs, rub, S3 or S4. Abdomen: Soft, non-distended.  Extremities: No clubbing, cyanosis or edema. DP/PT/Radials 2+ and equal bilaterally. Psych: Normal affect. Neuro: Alert and oriented X 3. Moves all extremities spontaneously.   Diagnostics Echocardiogram 04/25/2013 Study Conclusions - Left ventricle: The cavity size was mildly dilated. Systolic function was moderately to severely reduced. The estimated ejection fraction was in the range of 30% to 35%. Wall motion was normal; there were no regional wall motion abnormalities. - Aortic valve: Transvalvular velocity was within the normal range. There was no stenosis. No regurgitation. - Aortic root: The aortic root was normal in size. - Mitral valve: No regurgitation. - Left atrium: The atrium was moderately dilated. - Right ventricle: Systolic function was normal. - Right atrium: The atrium was normal in size. - Tricuspid valve: Trivial regurgitation. - Pulmonary arteries: Systolic pressure was within the normal range. - Pericardium, extracardiac: There was no pericardial effusion.  12-lead ECG today - normal sinus rhythm at 64 bpm; prolonged QT with manual QTc 495  Assessment and Plan Persistent atrial fibrillation s/p recent Tikosyn loading Mixed ischemic / tachy-mediated CM, EF 61-95% Chronic systolic HF  QT prolonged so will decrease Tikosyn dose to 250 micrograms  twice daily Check BMET and Mg today Return in one week for follow-up with repeat ECG and BMET, Mg Continue Xarelto for stroke prevention  Signed, Andrez Grime, PA-C 05/31/2013, 2:24 PM

## 2013-05-31 NOTE — Patient Instructions (Addendum)
Your physician recommends that you have lab work today:BMET,MAG  Your physician has recommended you make the following change in your medication:  1. Tikosyn 250 mcg 1 tab twice daily   Your physician recommends that you continue on your current medications as directed. Please refer to the Current Medication list given to you today.  Your physician recommends that you schedule a follow-up appointment in 1 week with Ileene Hutchinson 06/06/13 at 2:00pm same day when Dr. Lovena Le is in office

## 2013-06-04 ENCOUNTER — Encounter: Payer: Self-pay | Admitting: *Deleted

## 2013-06-04 ENCOUNTER — Telehealth: Payer: Self-pay | Admitting: *Deleted

## 2013-06-04 NOTE — Telephone Encounter (Signed)
Message copied by Burnett Kanaris on Mon Jun 04, 2013  3:03 PM ------      Message from: Andrez Grime      Created: Sat Jun 02, 2013  5:42 AM       Renal function, potassium and magnesium are normal. Continue Tikosyn 250 micrograms every 12 hours as directed. Return for follow-up as scheduled on Wed 5/13.       Please call patient to let him know. Thanks you! -Brooke ------

## 2013-06-04 NOTE — Telephone Encounter (Signed)
Pt aware of results. Pt had a question about a bill involving smoking sensation asked why it states he's a  light smoker I informed the patient in our records he smokes a cigar once a month. He stated he dose not  anymore will change that to former smoker in our chart history

## 2013-06-06 ENCOUNTER — Encounter: Payer: Self-pay | Admitting: Cardiology

## 2013-06-06 ENCOUNTER — Ambulatory Visit (INDEPENDENT_AMBULATORY_CARE_PROVIDER_SITE_OTHER): Payer: Medicare Other | Admitting: Cardiology

## 2013-06-06 VITALS — BP 118/68 | HR 74 | Ht 66.0 in | Wt 273.0 lb

## 2013-06-06 DIAGNOSIS — Z79899 Other long term (current) drug therapy: Secondary | ICD-10-CM | POA: Diagnosis not present

## 2013-06-06 DIAGNOSIS — Z5181 Encounter for therapeutic drug level monitoring: Secondary | ICD-10-CM

## 2013-06-06 DIAGNOSIS — I2510 Atherosclerotic heart disease of native coronary artery without angina pectoris: Secondary | ICD-10-CM

## 2013-06-06 DIAGNOSIS — I4891 Unspecified atrial fibrillation: Secondary | ICD-10-CM

## 2013-06-06 DIAGNOSIS — R9431 Abnormal electrocardiogram [ECG] [EKG]: Secondary | ICD-10-CM | POA: Diagnosis not present

## 2013-06-06 NOTE — Progress Notes (Signed)
ELECTROPHYSIOLOGY OFFICE NOTE   Patient ID: Norman Rodriguez MRN: [id number], DOB/AGE: 03-10-1955   Date of Visit: 06/06/2013  Primary Physician: No PCP Per Patient Primary Cardiologist: Marlou Porch, MD  Primary EP: Lovena Le, MD  Reason for Visit: One week Tikosyn follow-up   History of Present Illness  Norman Rodriguez is a 58 y.o. 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 He was recently referred to EP in the outpatient setting for consideration of primary prevention ICD implant. Recommendations were to pursue rhythm control in hopes that his cardiomyopathy would improve. Therefore, he was admitted for Tikosyn loading. 2D echo during that admission - EF 30-35%, no RWMA, normal RV, mod dilated LA. Tikosyn dose was decreased due to QT prolongation. He underwent DCCV on 04/27/2013) which was successful for restoring SR. He was discharged on 04/28/2013. On 05/04/2013 he was found to have recurrent atrial fibrillation. He was scheduled for outpatient DCCV and on the day of his procedure he experienced a fall and subsequent chest pain. He was admitted for observation 05/14/2013. MI ruled out with serial negative troponin. He was not volume overloaded. He was continued on Tikosyn and underwent DCCV which was successful for restoring SR on 05/15/2013. QTc on discharge was 490.   Last week he returned to the office for routine Tikosyn follow-up. His QTc was 495. Tikosyn was decreased to 250 mcg twice daily. Today, he presents for Tikosyn followup. Since discharge, he reports he is doing well and has no complaints. He states, "I'm feeling the best I have in several years. I could just hug Dr. Lovena Le." He denies chest pain or shortness of breath. He denies palpitations, dizziness, near syncope or syncope. He denies LE swelling, orthopnea or PND. He is compliant with medications.  Past Medical History Past Medical  History  Diagnosis Date  . Chronic systolic CHF (congestive heart failure)     a. Both ischemic and tachy induced. b. Echo 04/2013: EF 30-35%, no RWMA, normal RV, mod dilated LA (before restoration of NSR).  [name] CAD (coronary artery disease)     a. BMS 2.33mm 23 to LAD. CTO of RCA unsuccessful, good left to right collaterals - 2010.  [name] Atrial fibrillation     a. Dx 2010. b. Initiated on Tikosyn 03/2013.  [name] Chronic anticoagulation   . NSVT (nonsustained ventricular tachycardia)   . Hyperlipidemia   . HTN (hypertension)   . Obesity   . Dysrhythmia     hx of atrial fibrilation  . Shortness of breath   . Arthritis     Past Surgical History Past Surgical History  Procedure Laterality Date  . Coronary angioplasty with stent placement  2010    BMS 2.55mm 23 to LAD. CTO of RCA unsuccessful, good left to right collaterals  . Cardioversion N/A 04/27/2013    Procedure: BEDSIDE CARDIOVERSION;  Surgeon: Sanda Klein, MD;  Location: [facility];  Service: Cardiovascular;  Laterality: N/A;  . Foot surgery Right   . Cardioversion N/A 05/15/2013    Procedure: CARDIOVERSION/BEDSIDE;  Surgeon: Larey Dresser, MD;  Location: [facility] OR;  Service: Cardiovascular;  Laterality: N/A;    Allergies/Intolerances Allergies  Allergen Reactions  . Lisinopril Cough    Current Home Medications Current Outpatient Prescriptions  Medication Sig Dispense Refill  . aspirin EC 81 MG EC tablet Take 1 tablet (81 mg total) by mouth daily.      [name] atorvastatin (LIPITOR) 40 MG tablet Take 1 tablet (40 mg  total) by mouth daily.  30 tablet  6  . B Complex-C (B-COMPLEX WITH VITAMIN C) tablet Take 1 tablet by mouth daily.      . carvedilol (COREG) 12.5 MG tablet Take 1 tablet (12.5 mg total) by mouth 2 (two) times daily with a meal.  60 tablet  3  . Coenzyme Q10 (CO Q-10) 200 MG CAPS Take 1 capsule by mouth daily.      [name] dofetilide (TIKOSYN) 250 MCG capsule Take 1 capsule (250 mcg total) by mouth 2 (two) times daily.  60 capsule  6  .  fexofenadine (ALLEGRA) 180 MG tablet Take 180 mg by mouth daily.      . furosemide (LASIX) 40 MG tablet Take 40 mg by mouth daily.       . LevOCARNitine (CARNITINE PO) Take 500 mg by mouth daily.      [name] losartan (COZAAR) 50 MG tablet Take 25 mg by mouth daily.      . Magnesium 400 MG CAPS Take 1 capsule by mouth daily.       . meloxicam (MOBIC) 15 MG tablet Take 15 mg by mouth daily.      . Multiple Vitamin (MULTIVITAMIN) tablet Take 1 tablet by mouth daily.      . nitroGLYCERIN (NITROSTAT) 0.4 MG SL tablet Place 0.4 mg under the tongue every 5 (five) minutes as needed for chest pain.      . NON FORMULARY (Ribose) Take 1 capsule once a day      . NON FORMULARY Glycine viatmin 1 tab po qd      . Omega-3 Fatty Acids (OMEGA 3 PO) Take 2 capsules by mouth daily.       . potassium chloride SA (K-DUR,KLOR-CON) 20 MEQ tablet TAKE 1 TABLET BY MOUTH EVERY DAY  30 tablet  0  . Rivaroxaban (XARELTO) 20 MG TABS tablet Take 1 tablet (20 mg total) by mouth daily with supper.  30 tablet  11  . vitamin C (ASCORBIC ACID) 500 MG tablet Take 500 mg by mouth daily.      . vitamin E 400 UNIT capsule Take 400 Units by mouth daily.      [name] zinc gluconate 50 MG tablet Take 25 mg by mouth daily.        No current facility-administered medications for this visit.    Social History History   Social History  . Marital Status: Single    Spouse Name: N/A    Number of Children: N/A  . Years of Education: N/A   Occupational History  . Not on file.   Social History Main Topics  . Smoking status: Former Smoker    Types: Cigars  . Smokeless tobacco: Never Used     Comment: 1 cigar per month states he stopped smoking  . Alcohol Use: No  . Drug Use: No     Comment: 1 per month...pt states he stopped smoking  . Sexual Activity: Not on file   Other Topics Concern  . Not on file   Social History Narrative  . No narrative on file     Review of Systems General: No chills, fever, night sweats or weight  changes Cardiovascular: No chest pain, dyspnea on exertion, edema, orthopnea, palpitations, paroxysmal nocturnal dyspnea Dermatological: No rash, lesions or masses Respiratory: No cough, dyspnea Urologic: No hematuria, dysuria Abdominal: No nausea, vomiting, diarrhea, bright red blood per rectum, melena, or hematemesis Neurologic: No visual changes, weakness, changes in mental status All other systems reviewed and are  otherwise negative except as noted above.  Physical Exam Vitals: Blood pressure 118/68, pulse 74, height 5\' 6"  (1.676 m), weight 273 lb (123.832 kg).  General: Well developed, well appearing 58 y.o. male in no acute distress. HEENT: Normocephalic, atraumatic. EOMs intact. Sclera nonicteric. Oropharynx clear.  Neck: Supple. No JVD. Lungs: Respirations regular and unlabored, CTA bilaterally. No wheezes, rales or rhonchi. Heart: RRR. S1, S2 present. No murmurs, rub, S3 or S4. Abdomen: Soft, non-distended.  Extremities: No clubbing, cyanosis or edema. PT/Radials 2+ and equal bilaterally. Psych: Normal affect. Neuro: Alert and oriented X 3. Moves all extremities spontaneously.   Diagnostics Recent labs BMET and Mg    Component Value Date/Time   NA 137 05/31/2013 1543   K 4.4 05/31/2013 1543   CL 102 05/31/2013 1543   CO2 28 05/31/2013 1543   GLUCOSE 107* 05/31/2013 1543   BUN 11 05/31/2013 1543   CREATININE 0.9 05/31/2013 1543   CREATININE 0.87 04/24/2013 1016   CALCIUM 9.3 05/31/2013 1543   GFRNONAA >90 05/15/2013 0536   GFRAA >90 05/15/2013 0536  Magnesium 2.2  12-lead ECG today - SR with PACs; reviewed by Dr. Lovena Le in clinic today - QTc 470 msec   Assessment and Plan Persistent atrial fibrillation s/p recent Tikosyn loading  Mixed ischemic / tachy-mediated CM, EF 97-02%  Chronic systolic HF   QT prolonged last week so Tikosyn dose decreased to 250 micrograms twice daily on 05/31/2013 BMET shows stable renal function and normal potassium. Magnesium level normal.  QTc stable  today by ECG which was reviewed by Dr. Lovena Le today Continue current Tikosyn dose - 250 micrograms every 12 hours Continue Xarelto for stroke prevention Follow-up with Dr. Lovena Le in 6 weeks  Signed, Andrez Grime, PA-C 06/06/2013, 3:29 PM

## 2013-06-06 NOTE — Patient Instructions (Signed)
You will continue Tikosyn 250 micrograms every 12 hours Return for follow-up with Dr. Lovena Le in 6 weeks on 07/18/2013 at 3:00 PM. DO NOT start any new medications (over-the-counter or prescription) without notifying Dr. Lovena Le or Ysidro Evert, PharmD first.

## 2013-07-05 ENCOUNTER — Ambulatory Visit: Payer: Medicare Other | Attending: Orthopaedic Surgery

## 2013-07-05 ENCOUNTER — Ambulatory Visit: Payer: Medicare Other | Admitting: Internal Medicine

## 2013-07-05 DIAGNOSIS — M25619 Stiffness of unspecified shoulder, not elsewhere classified: Secondary | ICD-10-CM | POA: Diagnosis not present

## 2013-07-05 DIAGNOSIS — IMO0001 Reserved for inherently not codable concepts without codable children: Secondary | ICD-10-CM | POA: Diagnosis present

## 2013-07-05 DIAGNOSIS — R293 Abnormal posture: Secondary | ICD-10-CM | POA: Insufficient documentation

## 2013-07-05 DIAGNOSIS — M25519 Pain in unspecified shoulder: Secondary | ICD-10-CM | POA: Insufficient documentation

## 2013-07-16 ENCOUNTER — Other Ambulatory Visit: Payer: Self-pay

## 2013-07-16 MED ORDER — CARVEDILOL 12.5 MG PO TABS: 12.5000 mg | ORAL_TABLET | Freq: Two times a day (BID) | ORAL | Status: DC

## 2013-07-18 ENCOUNTER — Ambulatory Visit (INDEPENDENT_AMBULATORY_CARE_PROVIDER_SITE_OTHER): Payer: Medicare Other | Admitting: Internal Medicine

## 2013-07-18 ENCOUNTER — Other Ambulatory Visit: Payer: Self-pay | Admitting: Internal Medicine

## 2013-07-18 ENCOUNTER — Encounter: Payer: Self-pay | Admitting: Internal Medicine

## 2013-07-18 VITALS — BP 148/84 | HR 69 | Ht 66.0 in | Wt 276.0 lb

## 2013-07-18 DIAGNOSIS — I4891 Unspecified atrial fibrillation: Secondary | ICD-10-CM

## 2013-07-18 DIAGNOSIS — I5022 Chronic systolic (congestive) heart failure: Secondary | ICD-10-CM

## 2013-07-18 DIAGNOSIS — I2510 Atherosclerotic heart disease of native coronary artery without angina pectoris: Secondary | ICD-10-CM

## 2013-07-18 DIAGNOSIS — I480 Paroxysmal atrial fibrillation: Secondary | ICD-10-CM

## 2013-07-18 DIAGNOSIS — Z7901 Long term (current) use of anticoagulants: Secondary | ICD-10-CM

## 2013-07-18 DIAGNOSIS — E669 Obesity, unspecified: Secondary | ICD-10-CM

## 2013-07-18 MED ORDER — RIVAROXABAN 20 MG PO TABS: 20.0000 mg | ORAL_TABLET | Freq: Every day | ORAL | Status: DC

## 2013-07-18 NOTE — Assessment & Plan Note (Signed)
I strongly encouraged the patient to lose weight.

## 2013-07-18 NOTE — Assessment & Plan Note (Signed)
He appears to be maintaining NSR very nicely. We will continue Tikosyn and systemic anti-coagulation.

## 2013-07-18 NOTE — Assessment & Plan Note (Signed)
I suspect he is tachy mediated. At some point in the next few months, a repeat echo would be recommended. Will follow.

## 2013-07-18 NOTE — Patient Instructions (Addendum)
Your physician wants you to follow-up in: [place]. You will receive a reminder letter in the mail two months in advance. If you don't receive a letter, please call our office to schedule the follow-up appointment.   A REFILL FOR XARELTO WAS SENT IN TODAY

## 2013-07-18 NOTE — Progress Notes (Signed)
HPI [name] returns today for followup. He is a pleasant 58 yo man with a h/o atrial fib with a tachy induced CM, who was placed on Tikosyn and has maintained NSR. He has done well in the interim. He denies chest pain or sob. He has minimal edema. No syncope. Allergies  Allergen Reactions  . Lisinopril Cough     Current Outpatient Prescriptions  Medication Sig Dispense Refill  . aspirin EC 81 MG EC tablet Take 1 tablet (81 mg total) by mouth daily.      [name] atorvastatin (LIPITOR) 40 MG tablet Take 1 tablet (40 mg total) by mouth daily.  30 tablet  6  . B Complex-C (B-COMPLEX WITH VITAMIN C) tablet Take 1 tablet by mouth daily.      . carvedilol (COREG) 12.5 MG tablet Take 1 tablet (12.5 mg total) by mouth 2 (two) times daily with a meal.  60 tablet  6  . Coenzyme Q10 (CO Q-10) 200 MG CAPS Take 1 capsule by mouth daily.      [name] dofetilide (TIKOSYN) 250 MCG capsule Take 1 capsule (250 mcg total) by mouth 2 (two) times daily.  60 capsule  6  . fexofenadine (ALLEGRA) 180 MG tablet Take 180 mg by mouth daily.      . furosemide (LASIX) 40 MG tablet Take 40 mg by mouth daily.       . LevOCARNitine (CARNITINE PO) Take 500 mg by mouth daily.      [name] losartan (COZAAR) 50 MG tablet Take 25 mg by mouth daily.      . Magnesium 400 MG CAPS Take 1 capsule by mouth daily.       . meloxicam (MOBIC) 15 MG tablet Take 15 mg by mouth daily.      . Multiple Vitamin (MULTIVITAMIN) tablet Take 1 tablet by mouth daily.      . nitroGLYCERIN (NITROSTAT) 0.4 MG SL tablet Place 0.4 mg under the tongue every 5 (five) minutes as needed for chest pain.      . NON FORMULARY (Ribose) Take 1 capsule once a day      . NON FORMULARY Glycine viatmin 1 tab po qd      . Omega-3 Fatty Acids (OMEGA 3 PO) Take 2 capsules by mouth daily.       . potassium chloride SA (K-DUR,KLOR-CON) 20 MEQ tablet TAKE 1 TABLET BY MOUTH EVERY DAY  30 tablet  0  . Rivaroxaban (XARELTO) 20 MG TABS tablet Take 1 tablet (20 mg total) by  mouth daily with supper.  30 tablet  11  . vitamin C (ASCORBIC ACID) 500 MG tablet Take 500 mg by mouth daily.      . vitamin E 400 UNIT capsule Take 400 Units by mouth daily.      [name] zinc gluconate 50 MG tablet Take 25 mg by mouth daily.        No current facility-administered medications for this visit.     Past Medical History  Diagnosis Date  . Chronic systolic CHF (congestive heart failure)     a. Both ischemic and tachy induced. b. Echo 04/2013: EF 30-35%, no RWMA, normal RV, mod dilated LA (before restoration of NSR).  [name] CAD (coronary artery disease)     a. BMS 2.59mm 23 to LAD. CTO of RCA unsuccessful, good left to right collaterals - 2010.  [name] Atrial fibrillation     a. Dx 2010. b. Initiated on Tikosyn 03/2013.  [name] Chronic anticoagulation   .  NSVT (nonsustained ventricular tachycardia)   . Hyperlipidemia   . HTN (hypertension)   . Obesity   . Dysrhythmia     hx of atrial fibrilation  . Shortness of breath   . Arthritis     ROS:   All systems reviewed and negative except as noted in the HPI.   Past Surgical History  Procedure Laterality Date  . Coronary angioplasty with stent placement  2010    BMS 2.53mm 23 to LAD. CTO of RCA unsuccessful, good left to right collaterals  . Cardioversion N/A 04/27/2013    Procedure: BEDSIDE CARDIOVERSION;  Surgeon: Sanda Klein, MD;  Location: [facility];  Service: Cardiovascular;  Laterality: N/A;  . Foot surgery Right   . Cardioversion N/A 05/15/2013    Procedure: CARDIOVERSION/BEDSIDE;  Surgeon: Larey Dresser, MD;  Location: [facility] OR;  Service: Cardiovascular;  Laterality: N/A;     Family History  Problem Relation Age of Onset  . Other Father     Car accident  . Coronary artery disease Neg Hx      History   Social History  . Marital Status: Single    Spouse Name: N/A    Number of Children: N/A  . Years of Education: N/A   Occupational History  . Not on file.   Social History Main Topics  . Smoking status: Former Smoker     Types: Cigars  . Smokeless tobacco: Never Used     Comment: 1 cigar per month states he stopped smoking  . Alcohol Use: No  . Drug Use: No     Comment: 1 per month...pt states he stopped smoking  . Sexual Activity: Not on file   Other Topics Concern  . Not on file   Social History Narrative  . No narrative on file     BP 148/84  Pulse 69  Ht 5\' 6"  (1.676 m)  Wt 276 lb (125.193 kg)  BMI 44.57 kg/m2  Physical Exam:  Well appearing but obese middle aged man, NAD HEENT: Unremarkable Neck:  No JVD, no thyromegally Back:  No CVA tenderness Lungs:  Clear with no wheezes HEART:  Regular rate rhythm, no murmurs, no rubs, no clicks Abd:  soft, positive bowel sounds, no organomegally, no rebound, no guarding Ext:  2 plus pulses, trace edema, no cyanosis, no clubbing Skin:  No rashes no nodules Neuro:  CN II through XII intact, motor grossly intact  EKG - NSR   Assess/Plan:

## 2013-07-19 ENCOUNTER — Ambulatory Visit: Payer: Medicare Other

## 2013-07-19 DIAGNOSIS — IMO0001 Reserved for inherently not codable concepts without codable children: Secondary | ICD-10-CM | POA: Diagnosis not present

## 2013-07-24 ENCOUNTER — Telehealth: Payer: Self-pay | Admitting: *Deleted

## 2013-07-24 NOTE — Telephone Encounter (Signed)
Call received from pt  that he wanted Dr Ysidro Evert Smart to know that he has added to his medication list High Active Tart Cherry 25.1 conc/ 465mg   1 daily .States this is used for joint therapy

## 2013-07-26 ENCOUNTER — Ambulatory Visit: Payer: Medicare Other | Attending: Orthopaedic Surgery

## 2013-07-26 DIAGNOSIS — M25619 Stiffness of unspecified shoulder, not elsewhere classified: Secondary | ICD-10-CM | POA: Diagnosis not present

## 2013-07-26 DIAGNOSIS — M25519 Pain in unspecified shoulder: Secondary | ICD-10-CM | POA: Diagnosis not present

## 2013-07-26 DIAGNOSIS — IMO0001 Reserved for inherently not codable concepts without codable children: Secondary | ICD-10-CM | POA: Diagnosis not present

## 2013-07-26 DIAGNOSIS — R293 Abnormal posture: Secondary | ICD-10-CM | POA: Diagnosis not present

## 2013-07-31 ENCOUNTER — Other Ambulatory Visit: Payer: Self-pay | Admitting: *Deleted

## 2013-07-31 MED ORDER — POTASSIUM CHLORIDE CRYS ER 20 MEQ PO TBCR: EXTENDED_RELEASE_TABLET | ORAL | Status: DC

## 2013-08-02 ENCOUNTER — Ambulatory Visit: Payer: Medicare Other

## 2013-08-02 DIAGNOSIS — IMO0001 Reserved for inherently not codable concepts without codable children: Secondary | ICD-10-CM | POA: Diagnosis not present

## 2013-08-14 ENCOUNTER — Encounter: Payer: Medicare Other | Admitting: Rehabilitation

## 2013-08-16 ENCOUNTER — Ambulatory Visit: Payer: Medicare Other

## 2013-08-16 DIAGNOSIS — IMO0001 Reserved for inherently not codable concepts without codable children: Secondary | ICD-10-CM | POA: Diagnosis not present

## 2013-08-20 ENCOUNTER — Ambulatory Visit: Payer: Medicare Other | Admitting: Physical Therapy

## 2013-08-20 DIAGNOSIS — IMO0001 Reserved for inherently not codable concepts without codable children: Secondary | ICD-10-CM | POA: Diagnosis not present

## 2013-08-21 ENCOUNTER — Telehealth: Payer: Self-pay | Admitting: Cardiology

## 2013-08-21 NOTE — Telephone Encounter (Signed)
New Prob    Pt states he no longer takes Metoprolol. States he would like this removed from his medication list. He would also like to switch his pharmacy to [facility] [phone].

## 2013-08-21 NOTE — Telephone Encounter (Signed)
Spoke with patient.  He is aware Metoprolol is not on his list of medications.  He is taking carvedilol.  He does not need any prescriptions sent into his new pharmacy at this time.  He is scheduled for f/u in August with Dr Marlou Porch.

## 2013-08-22 ENCOUNTER — Ambulatory Visit: Payer: Medicare Other | Admitting: Physical Therapy

## 2013-08-22 ENCOUNTER — Other Ambulatory Visit: Payer: Self-pay

## 2013-08-22 DIAGNOSIS — IMO0001 Reserved for inherently not codable concepts without codable children: Secondary | ICD-10-CM | POA: Diagnosis not present

## 2013-08-22 MED ORDER — LOSARTAN POTASSIUM 50 MG PO TABS: 25.0000 mg | ORAL_TABLET | Freq: Every day | ORAL | Status: DC

## 2013-08-27 ENCOUNTER — Ambulatory Visit: Payer: Medicare Other | Admitting: Physical Therapy

## 2013-08-29 ENCOUNTER — Ambulatory Visit: Payer: Medicare Other | Attending: Orthopaedic Surgery | Admitting: Physical Therapy

## 2013-08-29 DIAGNOSIS — R293 Abnormal posture: Secondary | ICD-10-CM | POA: Insufficient documentation

## 2013-08-29 DIAGNOSIS — M25519 Pain in unspecified shoulder: Secondary | ICD-10-CM | POA: Diagnosis not present

## 2013-08-29 DIAGNOSIS — M25619 Stiffness of unspecified shoulder, not elsewhere classified: Secondary | ICD-10-CM | POA: Diagnosis not present

## 2013-08-29 DIAGNOSIS — IMO0001 Reserved for inherently not codable concepts without codable children: Secondary | ICD-10-CM | POA: Insufficient documentation

## 2013-09-04 ENCOUNTER — Ambulatory Visit: Payer: Medicare Other | Admitting: Physical Therapy

## 2013-09-04 DIAGNOSIS — IMO0001 Reserved for inherently not codable concepts without codable children: Secondary | ICD-10-CM | POA: Diagnosis not present

## 2013-09-06 ENCOUNTER — Ambulatory Visit: Payer: Medicare Other

## 2013-09-06 DIAGNOSIS — IMO0001 Reserved for inherently not codable concepts without codable children: Secondary | ICD-10-CM | POA: Diagnosis not present

## 2013-09-07 ENCOUNTER — Ambulatory Visit (INDEPENDENT_AMBULATORY_CARE_PROVIDER_SITE_OTHER): Payer: Medicare Other | Admitting: Cardiology

## 2013-09-07 ENCOUNTER — Encounter: Payer: Self-pay | Admitting: Cardiology

## 2013-09-07 VITALS — BP 124/76 | HR 64 | Ht 66.0 in | Wt 276.0 lb

## 2013-09-07 DIAGNOSIS — I480 Paroxysmal atrial fibrillation: Secondary | ICD-10-CM

## 2013-09-07 DIAGNOSIS — I428 Other cardiomyopathies: Secondary | ICD-10-CM

## 2013-09-07 DIAGNOSIS — I429 Cardiomyopathy, unspecified: Secondary | ICD-10-CM

## 2013-09-07 DIAGNOSIS — I2510 Atherosclerotic heart disease of native coronary artery without angina pectoris: Secondary | ICD-10-CM

## 2013-09-07 DIAGNOSIS — I4891 Unspecified atrial fibrillation: Secondary | ICD-10-CM

## 2013-09-07 NOTE — Patient Instructions (Signed)
The current medical regimen is effective;  continue present plan and medications.  Follow up with Dr Marlou Porch in 4 months.

## 2013-09-07 NOTE — Progress Notes (Signed)
[facility]. [address], [address] [place], [place]  [address] Phone: [phone] Fax:  [phone]  Date:  09/07/2013   ID:  Norman Rodriguez, DOB 1955-04-09, MRN [id number]  PCP:  No PCP Per Patient   History of Present Illness: Norman Rodriguez is a 58 y.o. 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 He was very satisfied with clinical symptoms, cardioversion, successful. QTC was 490 on discharge.   Wt Readings from Last 3 Encounters:  09/07/13 276 lb (125.193 kg)  07/18/13 276 lb (125.193 kg)  06/06/13 273 lb (123.832 kg)     Past Medical History  Diagnosis Date  . Chronic systolic CHF (congestive heart failure)     a. Both ischemic and tachy induced. b. Echo 04/2013: EF 30-35%, no RWMA, normal RV, mod dilated LA (before restoration of NSR).  [name] CAD (coronary artery disease)     a. BMS 2.93mm 23 to LAD. CTO of RCA unsuccessful, good left to right collaterals - 2010.  [name] Atrial fibrillation     a. Dx 2010. b. Initiated on Tikosyn 03/2013.  [name] Chronic anticoagulation   . NSVT (nonsustained ventricular tachycardia)   . Hyperlipidemia   . HTN (hypertension)   . Obesity   . Dysrhythmia     hx of atrial fibrilation  . Shortness of breath   . Arthritis     Past Surgical History  Procedure Laterality Date  . Coronary angioplasty with stent placement  2010    BMS 2.23mm 23 to LAD. CTO of RCA unsuccessful, good left to right collaterals  . Cardioversion N/A 04/27/2013    Procedure: BEDSIDE CARDIOVERSION;  Surgeon: Sanda Klein, MD;  Location: [facility];  Service: Cardiovascular;  Laterality: N/A;  . Foot surgery Right   . Cardioversion N/A 05/15/2013    Procedure: CARDIOVERSION/BEDSIDE;  Surgeon: Larey Dresser, MD;  Location: [facility];  Service: Cardiovascular;  Laterality: N/A;    Current Outpatient Prescriptions    Medication Sig Dispense Refill  . aspirin EC 81 MG EC tablet Take 1 tablet (81 mg total) by mouth daily.      [name] atorvastatin (LIPITOR) 40 MG tablet Take 1 tablet (40 mg total) by mouth daily.  30 tablet  6  . B Complex-C (B-COMPLEX WITH VITAMIN C) tablet Take 1 tablet by mouth daily.      . carvedilol (COREG) 12.5 MG tablet Take 1 tablet (12.5 mg total) by mouth 2 (two) times daily with a meal.  60 tablet  6  . Coenzyme Q10 (CO Q-10) 200 MG CAPS Take 1 capsule by mouth daily.      [name] dofetilide (TIKOSYN) 250 MCG capsule Take 1 capsule (250 mcg total) by mouth 2 (two) times daily.  60 capsule  6  . fexofenadine (ALLEGRA) 180 MG tablet Take 180 mg by mouth daily.      . furosemide (LASIX) 40 MG tablet Take 40 mg by mouth daily.       . LevOCARNitine (CARNITINE PO) Take 500 mg by mouth daily.      [name] losartan (COZAAR) 50 MG tablet Take 0.5 tablets (25 mg total) by mouth daily.  45 tablet  0  . Magnesium 400 MG CAPS Take 1 capsule by mouth daily.       . meloxicam (MOBIC) 15 MG tablet Take 15 mg by mouth daily.      [name]  Multiple Vitamin (MULTIVITAMIN) tablet Take 1 tablet by mouth daily.      . nitroGLYCERIN (NITROSTAT) 0.4 MG SL tablet Place 0.4 mg under the tongue every 5 (five) minutes as needed for chest pain.      . NON FORMULARY (Ribose) Take 1 capsule once a day      . NON FORMULARY Glycine viatmin 1 tab po qd      . NON FORMULARY 465 mg daily. [place]      . Omega-3 Fatty Acids (OMEGA 3 PO) Take 2 capsules by mouth daily.       [name] OVER THE COUNTER MEDICATION High Active Tart Cherry 25.1 conc/465mg  takes 1 daily for joint therapy      . potassium chloride SA (K-DUR,KLOR-CON) 20 MEQ tablet TAKE 1 TABLET BY MOUTH EVERY DAY  90 tablet  0  . rivaroxaban (XARELTO) 20 MG TABS tablet Take 1 tablet (20 mg total) by mouth daily with supper.  90 tablet  3  . vitamin C (ASCORBIC ACID) 500 MG tablet Take 500 mg by mouth daily.      . vitamin E 400 UNIT capsule Take 400 Units by mouth daily.       [name] zinc gluconate 50 MG tablet Take 25 mg by mouth daily.        No current facility-administered medications for this visit.    Allergies:    Allergies  Allergen Reactions  . Lisinopril Cough    Social History:  The patient  reports that he has quit smoking. His smoking use included Cigars. He has never used smokeless tobacco. He reports that he does not drink alcohol or use illicit drugs.   Family History  Problem Relation Age of Onset  . Other Father     Car accident  . Coronary artery disease Neg Hx     ROS:  Please see the history of present illness.   No bleeding, no syncope, no orthopnea, PND   All other systems reviewed and negative.   PHYSICAL EXAM: VS:  BP 124/76  Pulse 64  Ht 5\' 6"  (1.676 m)  Wt 276 lb (125.193 kg)  BMI 44.57 kg/m2  SpO2 97% Well nourished, well developed, in no acute distress HEENT: normal, [place]/AT, EOMI Neck: no JVD, normal carotid upstroke, no bruit Cardiac:  normal S1, S2; RRR; no murmur Lungs:  clear to auscultation bilaterally, no wheezing, rhonchi or rales Abd: soft, nontender, no hepatomegaly, no bruitsobese Ext: no edema, 2+ distal pulses Skin: warm and dry GU: deferred Neuro: no focal abnormalities noted, AAO x 3  EKG:  None today    ASSESSMENT AND PLAN:  1. Paroxysmal atrial fibrillation-currently controlled on dofetilide 250 mcg twice a day. He is very happy about this. He gave me a hug. Continue. 2. Cardiomyopathy-both tachycardia as well as ischemic-continue to encourage carvedilol 12.5 twice a day. He states that he is now taking this. 3. Chronic anticoagulation-as above. No bleeding. Recent blood work reassuring. 4. Obesity-continue to encourage weight loss. 5. We will see him back in 4 months.  Signed, [name], MD [facility]  09/07/2013 4:03 PM

## 2013-09-11 ENCOUNTER — Encounter: Payer: Medicare Other | Admitting: Rehabilitation

## 2013-09-13 ENCOUNTER — Encounter: Payer: Medicare Other | Admitting: Physical Therapy

## 2013-09-18 ENCOUNTER — Telehealth: Payer: Self-pay | Admitting: Cardiology

## 2013-09-18 NOTE — Telephone Encounter (Signed)
New message    Patient calling C/O pulse rate & blood pressure issues for couple of days.

## 2013-09-18 NOTE — Telephone Encounter (Signed)
PT  CALLED  COMPLAINING  WITH   ELEVATED HEART  OVER  THE   LAST 3-4  DAYS HEART  RATE  RANGING  FROM  93 -97  B/P   106/76 THESE   READINGS  ARE FROM   JUST  WATCHING  TV  VERY LITTLE  ACTIVITY   4  DAYS PREVIOUS  TO THAT  WERE RUNNING IN    THE MID  80'S NOT  SURE  WHY   THE  SUDDEN  JUMP  WILL FORWARD  TO  DR Marlou Porch TO REVIEW ./CY  MEDS CARVEDILOL  12.5 MG   BID  AND  TIKOSYN  250 MCG  BID

## 2013-09-19 ENCOUNTER — Telehealth: Payer: Self-pay | Admitting: Cardiology

## 2013-09-19 NOTE — Telephone Encounter (Signed)
New message      Want nurse to know his bp has been between 109/71 and 114/70 and his pulse rate is 87 and 90.

## 2013-09-19 NOTE — Telephone Encounter (Signed)
Norman Rodriguez    To  Arrange appt ./cy

## 2013-09-19 NOTE — Telephone Encounter (Signed)
Called pt this am to offer appointment (please see previous office visit note).  Spoke with pt this afternoon about coming in Friday per Dr Marlou Porch.  He states he has trouble with transportation and has to have aleast 3 full business days ahead before he can schedule.  Pt states he will continue to monitor and if HR goes up any high (or above 100 bpm) he will call back.  States he feels fine just didn't understand why his HR was elevated unless it was because he has been too hot where he lives.

## 2013-09-19 NOTE — Telephone Encounter (Signed)
Have him come in this Friday at [place], [place], MD

## 2013-10-11 ENCOUNTER — Telehealth: Payer: Self-pay | Admitting: Cardiology

## 2013-10-11 NOTE — Telephone Encounter (Signed)
New message    Checking on the status of cardiac clearance form.    Dr. Gladstone Lighter nurse.[phone].

## 2013-10-11 NOTE — Telephone Encounter (Signed)
I could not locate form on this pt. Pam RN and Dr Marlou Porch will be in the office tomorrow. I will forward this message to [facility] to follow-up tomorrow.

## 2013-10-12 NOTE — Telephone Encounter (Signed)
Do not have the form - called office Pollyann Savoy) and requested it be faxed to my attention

## 2013-10-12 NOTE — Telephone Encounter (Signed)
Follow Up   Pt is calling to follow up on form from [facility]. Please call.

## 2013-10-19 ENCOUNTER — Telehealth: Payer: Self-pay | Admitting: Internal Medicine

## 2013-10-19 NOTE — Telephone Encounter (Signed)
I advised him to continue his medication and call if it gets worse.  This all started when he started he started the medication but he is willing to deal with it as it is only once a month.  He will call back if it gets worse

## 2013-10-19 NOTE — Telephone Encounter (Signed)
°  Patient is on Tikosyn. And once a month the has severe diarrhea. Is this something that will go away, please call and advise.

## 2013-10-23 NOTE — Telephone Encounter (Signed)
Never received any further information from [facility] about surgical clearance.

## 2013-11-26 ENCOUNTER — Telehealth: Payer: Self-pay | Admitting: Cardiology

## 2013-11-26 NOTE — Telephone Encounter (Signed)
New Message        Pt calling stating that since med change he has been increasingly tired to the point of being barely being able to get out of bed in the morning. Has questions about medication and wanting to know if he can take a different dosage or only take this medication at night. Please call pt back and advise.

## 2013-11-26 NOTE — Telephone Encounter (Signed)
Pt calling c/o of continued fatigue.

## 2013-11-26 NOTE — Telephone Encounter (Signed)
Follow up ° ° ° ° °Returning Norman Rodriguez's call °

## 2013-11-26 NOTE — Telephone Encounter (Signed)
Left message to call back to discuss concerns. 

## 2013-11-26 NOTE — Telephone Encounter (Signed)
Pt calling back complaining of continuing fatigue related to Carvedilol 12.5 mg that he takes twice a day.  States in the AM it is all he can do to get out of bed.  Reports he also takes losartan 25 mg at bedtime as well.  He states his BP is good 120/68-70's.  He doesn't know what he BP is in the AM when he first gets up.  He doesn't remember his HR but states it is "good" as well.  He is wanting to take his carvedilol 12.5 mg both at once.  Advised that he can not do this, advised what the medication is for and how it works.  He states understanding but would like to know what he can do to feel better.  Instructed I will forward information to Dr Marlou Porch though I am unsure that there will be any changes at this point.  He is scheduled for f/u in December

## 2013-11-27 NOTE — Telephone Encounter (Signed)
Change to metoprolol ER (XL) 100mg  PO QD to be taken at night. Stop coreg.  [name], MD

## 2013-11-27 NOTE — Telephone Encounter (Signed)
Left message to call back  

## 2013-11-27 NOTE — Telephone Encounter (Signed)
Spoke with pt who states he doesn't want to go back on Metoprolol.  States he was on it years ago and didn't like the way it made him feel.  He will continue on carvedilol for now.  Last lab results faxed to [facility] at Dr Charlestine Night office per his request.

## 2013-11-27 NOTE — Telephone Encounter (Signed)
Left message to call back to discuss medication change. 

## 2013-11-27 NOTE — Telephone Encounter (Signed)
Follow up  ° ° ° °Returning call back to nurse  °

## 2013-12-25 ENCOUNTER — Other Ambulatory Visit: Payer: Self-pay | Admitting: Cardiology

## 2013-12-28 ENCOUNTER — Other Ambulatory Visit: Payer: Self-pay | Admitting: *Deleted

## 2013-12-28 MED ORDER — ATORVASTATIN CALCIUM 40 MG PO TABS: 40.0000 mg | ORAL_TABLET | Freq: Every day | ORAL | Status: DC

## 2014-01-08 ENCOUNTER — Ambulatory Visit (INDEPENDENT_AMBULATORY_CARE_PROVIDER_SITE_OTHER): Payer: Medicare Other | Admitting: Cardiology

## 2014-01-08 ENCOUNTER — Encounter: Payer: Self-pay | Admitting: Cardiology

## 2014-01-08 VITALS — BP 136/78 | HR 73 | Ht 66.0 in | Wt 286.0 lb

## 2014-01-08 DIAGNOSIS — I2583 Coronary atherosclerosis due to lipid rich plaque: Secondary | ICD-10-CM

## 2014-01-08 DIAGNOSIS — I429 Cardiomyopathy, unspecified: Secondary | ICD-10-CM | POA: Diagnosis not present

## 2014-01-08 DIAGNOSIS — I480 Paroxysmal atrial fibrillation: Secondary | ICD-10-CM | POA: Diagnosis not present

## 2014-01-08 DIAGNOSIS — I482 Chronic atrial fibrillation, unspecified: Secondary | ICD-10-CM

## 2014-01-08 DIAGNOSIS — I5022 Chronic systolic (congestive) heart failure: Secondary | ICD-10-CM

## 2014-01-08 DIAGNOSIS — I2510 Atherosclerotic heart disease of native coronary artery without angina pectoris: Secondary | ICD-10-CM

## 2014-01-08 DIAGNOSIS — E669 Obesity, unspecified: Secondary | ICD-10-CM

## 2014-01-08 DIAGNOSIS — Z7901 Long term (current) use of anticoagulants: Secondary | ICD-10-CM

## 2014-01-08 LAB — CBC
HCT: 46.6 % (ref 39.0–52.0)
Hemoglobin: 15.5 g/dL (ref 13.0–17.0)
MCHC: 33.3 g/dL (ref 30.0–36.0)
MCV: 92.9 fl (ref 78.0–100.0)
Platelets: 241 10*3/uL (ref 150.0–400.0)
RBC: 5.02 Mil/uL (ref 4.22–5.81)
RDW: 13.2 % (ref 11.5–15.5)
WBC: 8.7 10*3/uL (ref 4.0–10.5)

## 2014-01-08 LAB — BASIC METABOLIC PANEL
BUN: 9 mg/dL (ref 6–23)
CALCIUM: 9 mg/dL (ref 8.4–10.5)
CHLORIDE: 101 meq/L (ref 96–112)
CO2: 28 mEq/L (ref 19–32)
Creatinine, Ser: 0.9 mg/dL (ref 0.4–1.5)
GFR: 90.1 mL/min (ref >60.00)
Glucose, Bld: 106 mg/dL — ABNORMAL HIGH (ref 70–99)
Potassium: 4.3 mEq/L (ref 3.5–5.1)
Sodium: 135 mEq/L (ref 135–145)

## 2014-01-08 NOTE — Patient Instructions (Signed)
The current medical regimen is effective;  continue present plan and medications.  Please have blood work today (CBC,BMP)  Your physician has requested that you have an echocardiogram in April 2016. Echocardiography is a painless test that uses sound waves to create images of your heart. It provides your doctor with information about the size and shape of your heart and how well your heart's chambers and valves are working. This procedure takes approximately one hour. There are no restrictions for this procedure.  Follow up in 4 months with Dr. Marlou Porch.  You will receive a letter in the mail 2 months before you are due.  Please call us when you receive this letter to schedule your follow up appointment.

## 2014-01-08 NOTE — Progress Notes (Signed)
[place]. [address], [address] [place], [place]  [address] Phone: [phone] Fax:  [phone]  Date:  01/08/2014   ID:  Norman Rodriguez, DOB [birth year], MRN [id number]  PCP:  Pcp Not In System   History of Present Illness: Norman Rodriguez is a 58 y.o. 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 He was very satisfied with clinical symptoms, cardioversion, successful. QTC was 490 on discharge.  Still feeling shortness of breath at times. Does not like to take furosemide because of urination.   Wt Readings from Last 3 Encounters:  01/08/14 286 lb (129.729 kg)  09/07/13 276 lb (125.193 kg)  07/18/13 276 lb (125.193 kg)     Past Medical History  Diagnosis Date  . Chronic systolic CHF (congestive heart failure)     a. Both ischemic and tachy induced. b. Echo 04/2013: EF 30-35%, no RWMA, normal RV, mod dilated LA (before restoration of NSR).  [name] CAD (coronary artery disease)     a. BMS 2.68mm 23 to LAD. CTO of RCA unsuccessful, good left to right collaterals - 2010.  [name] Atrial fibrillation     a. Dx 2010. b. Initiated on Tikosyn 03/2013.  [name] Chronic anticoagulation   . NSVT (nonsustained ventricular tachycardia)   . Hyperlipidemia   . HTN (hypertension)   . Obesity   . Dysrhythmia     hx of atrial fibrilation  . Shortness of breath   . Arthritis     Past Surgical History  Procedure Laterality Date  . Coronary angioplasty with stent placement  2010    BMS 2.53mm 23 to LAD. CTO of RCA unsuccessful, good left to right collaterals  . Cardioversion N/A 04/27/2013    Procedure: BEDSIDE CARDIOVERSION;  Surgeon: Sanda Klein, MD;  Location: [place];  Service: Cardiovascular;  Laterality: N/A;  . Foot surgery Right   . Cardioversion N/A 05/15/2013    Procedure: CARDIOVERSION/BEDSIDE;  Surgeon: Larey Dresser, MD;   Location: [place];  Service: Cardiovascular;  Laterality: N/A;    Current Outpatient Prescriptions  Medication Sig Dispense Refill  . aspirin EC 81 MG EC tablet Take 1 tablet (81 mg total) by mouth daily.    [name] atorvastatin (LIPITOR) 40 MG tablet Take 1 tablet (40 mg total) by mouth daily. 30 tablet 0  . B Complex-C (B-COMPLEX WITH VITAMIN C) tablet Take 1 tablet by mouth daily.    . carvedilol (COREG) 12.5 MG tablet Take 1 tablet (12.5 mg total) by mouth 2 (two) times daily with a meal. 60 tablet 6  . Coenzyme Q10 (CO Q-10) 200 MG CAPS Take 1 capsule by mouth daily.    [name] dofetilide (TIKOSYN) 250 MCG capsule Take 1 capsule (250 mcg total) by mouth 2 (two) times daily. 60 capsule 6  . fexofenadine (ALLEGRA) 180 MG tablet Take 180 mg by mouth daily.    . furosemide (LASIX) 40 MG tablet Take 40 mg by mouth daily.     . LevOCARNitine (CARNITINE PO) Take 500 mg by mouth daily.    [name] losartan (COZAAR) 50 MG tablet Take 0.5 tablets (25 mg total) by mouth daily. 45 tablet 0  . Magnesium 400 MG CAPS Take 1 capsule by mouth daily.     . meloxicam (MOBIC) 15 MG tablet Take 15 mg by mouth daily.    . Multiple Vitamin (MULTIVITAMIN) tablet Take 1 tablet by  mouth daily.    . nitroGLYCERIN (NITROSTAT) 0.4 MG SL tablet Place 0.4 mg under the tongue every 5 (five) minutes as needed for chest pain.    . NON FORMULARY (Ribose) Take 1 capsule once a day    . NON FORMULARY Glycine viatmin 1 tab po qd    . NON FORMULARY 465 mg daily. [place]    . Omega-3 Fatty Acids (OMEGA 3 PO) Take 2 capsules by mouth daily.     [name] OVER THE COUNTER MEDICATION High Active Tart Cherry 25.1 conc/465mg  takes 1 daily for joint therapy    . potassium chloride SA (K-DUR,KLOR-CON) 20 MEQ tablet TAKE 1 TABLET BY MOUTH ONCE DAILY 30 tablet 0  . rivaroxaban (XARELTO) 20 MG TABS tablet Take 1 tablet (20 mg total) by mouth daily with supper. 90 tablet 3  . vitamin C (ASCORBIC ACID) 500 MG tablet Take 500 mg by mouth daily.    .  vitamin E 400 UNIT capsule Take 400 Units by mouth daily.    [name] zinc gluconate 50 MG tablet Take 25 mg by mouth daily.      No current facility-administered medications for this visit.    Allergies:    Allergies  Allergen Reactions  . Lisinopril Cough    Social History:  The patient  reports that he has quit smoking. His smoking use included Cigars. He has never used smokeless tobacco. He reports that he does not drink alcohol or use illicit drugs.   Family History  Problem Relation Age of Onset  . Other Father     Car accident  . Coronary artery disease Neg Hx     ROS:  Please see the history of present illness.   No bleeding, no syncope, no orthopnea, PND   All other systems reviewed and negative.   PHYSICAL EXAM: VS:  BP 136/78 mmHg  Pulse 73  Ht 5\' 6"  (1.676 m)  Wt 286 lb (129.729 kg)  BMI 46.18 kg/m2 Well nourished, well developed, in no acute distress HEENT: normal, [place]/AT, EOMI Neck: no JVD, normal carotid upstroke, no bruit Cardiac:  normal S1, S2; RRR; no murmur Lungs:  clear to auscultation bilaterally, no wheezing, rhonchi or rales Abd: soft, nontender, no hepatomegaly, no bruitsobese Ext: no edema, 2+ distal pulses Skin: warm and dry GU: deferred Neuro: no focal abnormalities noted, AAO x 3  EKG:  None today    ASSESSMENT AND PLAN:  1. Paroxysmal atrial fibrillation-currently controlled on dofetilide 250 mcg twice a day. He is very happy about this.  Continue. He is in normal rhythm 2. Cardiomyopathy-both tachycardia as well as ischemic-continue to encourage carvedilol 12.5 twice a day. He states that he is now taking this. He still continues to have shortness of breath with activity. Multifactorial from obesity, cardiomyopathy, deconditioning. I'm encouraged however that his heart rate is better controlled to allow more efficiency. 3. Chronic anticoagulation-as above. No bleeding. Recent blood work reassuring. Repeating. 4. Coronary artery disease-prior  cardiac catheterization reviewed. 5. Obesity-continue to encourage weight loss. 6. We will see him back in 4 months. Check echocardiogram at this time  Signed, [name], MD [facility]  01/08/2014 3:05 PM

## 2014-01-21 ENCOUNTER — Other Ambulatory Visit: Payer: Self-pay | Admitting: Cardiology

## 2014-01-23 ENCOUNTER — Other Ambulatory Visit: Payer: Self-pay | Admitting: *Deleted

## 2014-01-23 MED ORDER — FUROSEMIDE 40 MG PO TABS: 40.0000 mg | ORAL_TABLET | Freq: Every day | ORAL | Status: DC

## 2014-02-07 ENCOUNTER — Encounter (HOSPITAL_COMMUNITY): Payer: Self-pay | Admitting: Cardiology

## 2014-03-01 ENCOUNTER — Other Ambulatory Visit: Payer: Self-pay

## 2014-03-01 MED ORDER — CARVEDILOL 12.5 MG PO TABS: 12.5000 mg | ORAL_TABLET | Freq: Two times a day (BID) | ORAL | Status: DC

## 2014-03-26 ENCOUNTER — Ambulatory Visit: Payer: Self-pay

## 2014-03-26 DIAGNOSIS — I4891 Unspecified atrial fibrillation: Secondary | ICD-10-CM

## 2014-04-02 ENCOUNTER — Other Ambulatory Visit: Payer: Self-pay

## 2014-04-02 MED ORDER — DOFETILIDE 250 MCG PO CAPS: 250.0000 ug | ORAL_CAPSULE | Freq: Two times a day (BID) | ORAL | Status: DC

## 2014-04-03 ENCOUNTER — Other Ambulatory Visit: Payer: Self-pay | Admitting: *Deleted

## 2014-04-03 DIAGNOSIS — Z7901 Long term (current) use of anticoagulants: Secondary | ICD-10-CM

## 2014-04-03 MED ORDER — RIVAROXABAN 20 MG PO TABS: 20.0000 mg | ORAL_TABLET | Freq: Every day | ORAL | Status: DC

## 2014-04-03 MED ORDER — DOFETILIDE 250 MCG PO CAPS: 250.0000 ug | ORAL_CAPSULE | Freq: Two times a day (BID) | ORAL | Status: DC

## 2014-04-26 ENCOUNTER — Telehealth: Payer: Self-pay | Admitting: Cardiology

## 2014-04-26 NOTE — Telephone Encounter (Signed)
New Message   Pt c/o medication issue:  1. Name of Medication: Gabapentin  2. How are you currently taking this medication (dosage and times per day)? 100mg  /daily  3. Are you having a reaction (difficulty breathing--STAT)? SOB  4. What is your medication issue? Nerve problems and circulation in left arm

## 2014-04-26 NOTE — Telephone Encounter (Signed)
Pt states that he started Gabapentin 100mg  on 04/23/14 at bedtime. Pt states that he only took the one dose and hasn't taken anymore because the next day he started having issues with diarrhea and had incontinent episodes. Pt states that he has been fatigued since these symptoms began. Pt wanted to know if Dr. Marlou Porch thought the Gabapentin may be interacting with his heart medications. Spoke with Dr. Marlou Porch in the office and he states that he does not believe the Gabapentin is interfering with his heart medications and that he should contact the office that prescribed the Gabapentin in regards to symptoms. Called pt back and informed him of Dr. Marlou Porch recommendation. Pt verbalized understanding and was in agreement with this plan.

## 2014-05-08 ENCOUNTER — Ambulatory Visit (INDEPENDENT_AMBULATORY_CARE_PROVIDER_SITE_OTHER): Payer: Medicare Other | Admitting: Cardiology

## 2014-05-08 ENCOUNTER — Encounter: Payer: Self-pay | Admitting: Cardiology

## 2014-05-08 VITALS — BP 110/78 | HR 102 | Ht 66.0 in | Wt 279.0 lb

## 2014-05-08 DIAGNOSIS — I480 Paroxysmal atrial fibrillation: Secondary | ICD-10-CM

## 2014-05-08 DIAGNOSIS — E785 Hyperlipidemia, unspecified: Secondary | ICD-10-CM

## 2014-05-08 DIAGNOSIS — I2510 Atherosclerotic heart disease of native coronary artery without angina pectoris: Secondary | ICD-10-CM | POA: Diagnosis not present

## 2014-05-08 DIAGNOSIS — Z01812 Encounter for preprocedural laboratory examination: Secondary | ICD-10-CM | POA: Diagnosis not present

## 2014-05-08 DIAGNOSIS — I5022 Chronic systolic (congestive) heart failure: Secondary | ICD-10-CM | POA: Diagnosis not present

## 2014-05-08 DIAGNOSIS — I429 Cardiomyopathy, unspecified: Secondary | ICD-10-CM

## 2014-05-08 DIAGNOSIS — I2583 Coronary atherosclerosis due to lipid rich plaque: Principal | ICD-10-CM

## 2014-05-08 NOTE — Progress Notes (Signed)
[place]. [address], [address] [place], [place]  [address] Phone: [phone] Fax:  [phone]  Date:  05/08/2014   ID:  Norman Rodriguez, DOB August 15, 1955, MRN [id number]  PCP:  Pcp Not In System   History of Present Illness: Norman Rodriguez is a 59 y.o. 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 He was very satisfied with clinical symptoms, cardioversion, successful. QTC was 490 on discharge.  Still feeling shortness of breath at times. Does not like to take furosemide because of urination.   battled with quite severe allergies this season.  He didn't realize it but auscultation, he is in atrial fibrillation. EKG confirmed this.   Wt Readings from Last 3 Encounters:  05/08/14 279 lb (126.554 kg)  01/08/14 286 lb (129.729 kg)  09/07/13 276 lb (125.193 kg)     Past Medical History  Diagnosis Date  . Chronic systolic CHF (congestive heart failure)     a. Both ischemic and tachy induced. b. Echo 04/2013: EF 30-35%, no RWMA, normal RV, mod dilated LA (before restoration of NSR).  [name] CAD (coronary artery disease)     a. BMS 2.53mm 23 to LAD. CTO of RCA unsuccessful, good left to right collaterals - 2010.  [name] Atrial fibrillation     a. Dx 2010. b. Initiated on Tikosyn 03/2013.  [name] Chronic anticoagulation   . NSVT (nonsustained ventricular tachycardia)   . Hyperlipidemia   . HTN (hypertension)   . Obesity   . Dysrhythmia     hx of atrial fibrilation  . Shortness of breath   . Arthritis     Past Surgical History  Procedure Laterality Date  . Coronary angioplasty with stent placement  2010    BMS 2.32mm 23 to LAD. CTO of RCA unsuccessful, good left to right collaterals  . Cardioversion N/A 04/27/2013    Procedure: BEDSIDE CARDIOVERSION;  Surgeon: Sanda Klein, MD;  Location: [place];  Service: Cardiovascular;   Laterality: N/A;  . Foot surgery Right   . Cardioversion N/A 05/15/2013    Procedure: CARDIOVERSION/BEDSIDE;  Surgeon: Larey Dresser, MD;  Location: [place];  Service: Cardiovascular;  Laterality: N/A;    Current Outpatient Prescriptions  Medication Sig Dispense Refill  . aspirin EC 81 MG EC tablet Take 1 tablet (81 mg total) by mouth daily.    [name] atorvastatin (LIPITOR) 40 MG tablet TAKE 1 TABLET (40 MG TOTAL) BY MOUTH DAILY. 30 tablet 6  . B Complex-C (B-COMPLEX WITH VITAMIN C) tablet Take 1 tablet by mouth daily.    . carvedilol (COREG) 12.5 MG tablet Take 1 tablet (12.5 mg total) by mouth 2 (two) times daily with a meal. 180 tablet 1  . Coenzyme Q10 (CO Q-10) 200 MG CAPS Take 1 capsule by mouth daily.    [name] dofetilide (TIKOSYN) 250 MCG capsule Take 1 capsule (250 mcg total) by mouth 2 (two) times daily. 180 capsule 1  . fexofenadine (ALLEGRA) 180 MG tablet Take 180 mg by mouth daily.    . furosemide (LASIX) 40 MG tablet Take 1 tablet (40 mg total) by mouth daily. 90 tablet 3  . LevOCARNitine (CARNITINE PO) Take 500 mg by mouth daily.    [name] losartan (COZAAR) 50 MG tablet Take 0.5 tablets (25 mg total) by mouth daily. 45 tablet 0  . Magnesium 400 MG CAPS Take 1 capsule by mouth daily.     [name]  meloxicam (MOBIC) 15 MG tablet Take 15 mg by mouth daily.    . Multiple Vitamin (MULTIVITAMIN) tablet Take 1 tablet by mouth daily.    . nitroGLYCERIN (NITROSTAT) 0.4 MG SL tablet Place 0.4 mg under the tongue every 5 (five) minutes as needed for chest pain.    . NON FORMULARY (Ribose) Take 1 capsule once a day    . NON FORMULARY Glycine viatmin 1 tab po qd    . NON FORMULARY 465 mg daily. [place]    . Omega-3 Fatty Acids (OMEGA 3 PO) Take 2 capsules by mouth daily.     [name] OVER THE COUNTER MEDICATION High Active Tart Cherry 25.1 conc/465mg  takes 1 daily for joint therapy    . potassium chloride SA (K-DUR,KLOR-CON) 20 MEQ tablet TAKE 1 TABLET BY MOUTH ONCE DAILY 30 tablet 6  . rivaroxaban  (XARELTO) 20 MG TABS tablet Take 1 tablet (20 mg total) by mouth daily with supper. 90 tablet 0  . vitamin C (ASCORBIC ACID) 500 MG tablet Take 500 mg by mouth daily.    . vitamin E 400 UNIT capsule Take 400 Units by mouth daily.    [name] zinc gluconate 50 MG tablet Take 25 mg by mouth daily.      No current facility-administered medications for this visit.    Allergies:    Allergies  Allergen Reactions  . Lisinopril Cough    Social History:  The patient  reports that he has quit smoking. His smoking use included Cigars. He has never used smokeless tobacco. He reports that he does not drink alcohol or use illicit drugs.   Family History  Problem Relation Age of Onset  . Other Father     Car accident  . Coronary artery disease Neg Hx     ROS:  Please see the history of present illness.   Positive for recent chills, chest pain, shortness of breath lying down, excessive fatigue, anxiety, cough, shortness of breath with activity , muscle pain, dizziness , bad allergy and coughing getting better. No bleeding, no syncope, no orthopnea, PND   All other systems reviewed and negative.   PHYSICAL EXAM: VS:  BP 110/78 mmHg  Pulse 102  Ht 5\' 6"  (1.676 m)  Wt 279 lb (126.554 kg)  BMI 45.05 kg/m2 Well nourished, well developed, in no acute distress HEENT: normal, [place]/AT, EOMI Neck: no JVD, normal carotid upstroke, no bruit Cardiac:  normal S1, S2;  Irregularly irregular; no murmur Lungs:  clear to auscultation bilaterally, no wheezing, rhonchi or rales Abd: soft, nontender, no hepatomegaly, no bruitsobese Ext: no edema, 2+ distal pulses Skin: warm and dry GU: deferred Neuro: no focal abnormalities noted, AAO x 3  EKG:  None today    ASSESSMENT AND PLAN:  1. Paroxysmal atrial fibrillation- Was very wellcurrently controlled on dofetilide 250 mcg twice a day for approximately a year.  Fortunately, EKG today shows atrial fibrillation once again. We will set him up for cardioversion. He has not  missed any doses of Xarelto or dofetilide. Continue.  Interestingly, he did not think that he was in atrial fibrillation. Risks and benefits of cardioversion discussed. 2. Cardiomyopathy-both tachycardia as well as ischemic-continue to encourage carvedilol 12.5 twice a day.  He still continues to have shortness of breath with activity. Multifactorial from obesity, cardiomyopathy, deconditioning. I'm encouraged however that his heart rate is better controlled to allow more efficiency. 3. Chronic anticoagulation-as above. No bleeding. Recent blood work reassuring. Repeating. Xarelto.  4. Coronary artery disease-prior  cardiac catheterization reviewed. 5. Obesity-continue to encourage weight loss. 6. We will see him back in 2 weeks post cardioversion.   Signed, [name], MD [facility]  05/08/2014 4:03 PM

## 2014-05-08 NOTE — Patient Instructions (Addendum)
Medication Instructions: Your physician recommends that you continue on your current medications as directed. Please refer to the Current Medication list given to you today.   Labwork: Today (BMET, CBCD and INR)  Testing/Procedures: Your physician has recommended that you have a Cardioversion (DCCV). Electrical Cardioversion uses a jolt of electricity to your heart either through paddles or wired patches attached to your chest. This is a controlled, usually prescheduled, procedure. Defibrillation is done under light anesthesia in the hospital, and you usually go home the day of the procedure. This is done to get your heart back into a normal rhythm. You are not awake for the procedure. Please see the instruction sheet given to you today.    Follow-Up: 2 weeks after Cardioversion.

## 2014-05-09 ENCOUNTER — Telehealth: Payer: Self-pay | Admitting: Cardiology

## 2014-05-09 LAB — BASIC METABOLIC PANEL
BUN: 13 mg/dL (ref 6–23)
CHLORIDE: 100 meq/L (ref 96–112)
CO2: 31 mEq/L (ref 19–32)
CREATININE: 1.14 mg/dL (ref 0.40–1.50)
Calcium: 9.3 mg/dL (ref 8.4–10.5)
GFR: 69.39 mL/min (ref >60.00)
Glucose, Bld: 100 mg/dL — ABNORMAL HIGH (ref 70–99)
POTASSIUM: 4.7 meq/L (ref 3.5–5.1)
SODIUM: 135 meq/L (ref 135–145)

## 2014-05-09 LAB — CBC WITH DIFFERENTIAL/PLATELET
BASOS ABS: 0 10*3/uL (ref 0.0–0.1)
BASOS PCT: 0.3 % (ref 0.0–3.0)
EOS ABS: 0.2 10*3/uL (ref 0.0–0.7)
Eosinophils Relative: 1.8 % (ref 0.0–5.0)
HCT: 47.9 % (ref 39.0–52.0)
Hemoglobin: 16.4 g/dL (ref 13.0–17.0)
Lymphocytes Relative: 31.4 % (ref 12.0–46.0)
Lymphs Abs: 3 10*3/uL (ref 0.7–4.0)
MCHC: 34.3 g/dL (ref 30.0–36.0)
MCV: 91 fl (ref 78.0–100.0)
Monocytes Absolute: 0.5 10*3/uL (ref 0.1–1.0)
Monocytes Relative: 5.4 % (ref 3.0–12.0)
NEUTROS PCT: 61.1 % (ref 43.0–77.0)
Neutro Abs: 5.9 10*3/uL (ref 1.4–7.7)
Platelets: 294 10*3/uL (ref 150.0–400.0)
RBC: 5.27 Mil/uL (ref 4.22–5.81)
RDW: 13.9 % (ref 11.5–15.5)
WBC: 9.6 10*3/uL (ref 4.0–10.5)

## 2014-05-09 LAB — PROTIME-INR
INR: 1.7 ratio — ABNORMAL HIGH (ref 0.8–1.0)
Prothrombin Time: 18.6 s — ABNORMAL HIGH (ref 9.6–13.1)

## 2014-05-09 NOTE — Telephone Encounter (Signed)
Dr/Nurse are here tomorrow, I will forward to have them address his question. msg left to inform pt to expect a call back tomorrow.

## 2014-05-09 NOTE — Telephone Encounter (Signed)
New message      Pt is due to have a cardioversion on 05-16-14.  Will he still need to come in on 05-17-14 for his echo?

## 2014-05-10 ENCOUNTER — Other Ambulatory Visit (HOSPITAL_COMMUNITY): Payer: Medicare Other

## 2014-05-10 NOTE — Telephone Encounter (Signed)
F/u ° ° °Pt returning your call °

## 2014-05-10 NOTE — Telephone Encounter (Signed)
**Note De-identified Maor Meckel Obfuscation** The pt is advised and he verbalized understanding. 

## 2014-05-10 NOTE — Telephone Encounter (Signed)
Ok to hold off of echo. Have cardioversion first [name], MD

## 2014-05-10 NOTE — Telephone Encounter (Signed)
Follow up      Pt calling to see if the echo scheduled is necessary.  He is having a cardioversion on 4-21 and an echo on 4-22.  Is this an old appt?

## 2014-05-10 NOTE — Telephone Encounter (Signed)
**Note De-Identified Raygan Skarda Obfuscation** LMTCB.  Per Dr Marlou Porch the pt does not need to have Echo on 4/22 as Dr Marlou Porch wants to do cardioversion first and that he may not need the Echo. I have canceled the pts Echo that was scheduled to be done on 4/22.

## 2014-05-16 ENCOUNTER — Ambulatory Visit (HOSPITAL_COMMUNITY)
Admission: RE | Admit: 2014-05-16 | Discharge: 2014-05-16 | Disposition: A | Discharge status: Home / Self Care | Payer: Medicare Other | Source: Ambulatory Visit | Attending: Cardiology | Admitting: Cardiology

## 2014-05-16 ENCOUNTER — Encounter (HOSPITAL_COMMUNITY): Payer: Self-pay

## 2014-05-16 ENCOUNTER — Ambulatory Visit (HOSPITAL_COMMUNITY): Payer: Medicare Other | Admitting: Anesthesiology

## 2014-05-16 ENCOUNTER — Encounter (HOSPITAL_COMMUNITY)
Admission: RE | Disposition: A | Discharge status: Home / Self Care | Payer: Medicare Other | Source: Ambulatory Visit | Attending: Cardiology

## 2014-05-16 DIAGNOSIS — I2510 Atherosclerotic heart disease of native coronary artery without angina pectoris: Secondary | ICD-10-CM | POA: Diagnosis not present

## 2014-05-16 DIAGNOSIS — I10 Essential (primary) hypertension: Secondary | ICD-10-CM | POA: Insufficient documentation

## 2014-05-16 DIAGNOSIS — Z791 Long term (current) use of non-steroidal anti-inflammatories (NSAID): Secondary | ICD-10-CM | POA: Insufficient documentation

## 2014-05-16 DIAGNOSIS — Z79899 Other long term (current) drug therapy: Secondary | ICD-10-CM | POA: Diagnosis not present

## 2014-05-16 DIAGNOSIS — E785 Hyperlipidemia, unspecified: Secondary | ICD-10-CM | POA: Diagnosis not present

## 2014-05-16 DIAGNOSIS — I4891 Unspecified atrial fibrillation: Secondary | ICD-10-CM | POA: Diagnosis present

## 2014-05-16 DIAGNOSIS — Z955 Presence of coronary angioplasty implant and graft: Secondary | ICD-10-CM | POA: Diagnosis not present

## 2014-05-16 DIAGNOSIS — I5022 Chronic systolic (congestive) heart failure: Secondary | ICD-10-CM | POA: Diagnosis not present

## 2014-05-16 DIAGNOSIS — Z7982 Long term (current) use of aspirin: Secondary | ICD-10-CM | POA: Insufficient documentation

## 2014-05-16 DIAGNOSIS — I481 Persistent atrial fibrillation: Secondary | ICD-10-CM | POA: Diagnosis not present

## 2014-05-16 DIAGNOSIS — Z6841 Body Mass Index (BMI) 40.0 and over, adult: Secondary | ICD-10-CM | POA: Insufficient documentation

## 2014-05-16 DIAGNOSIS — E669 Obesity, unspecified: Secondary | ICD-10-CM | POA: Diagnosis not present

## 2014-05-16 DIAGNOSIS — D688 Other specified coagulation defects: Secondary | ICD-10-CM | POA: Insufficient documentation

## 2014-05-16 DIAGNOSIS — Z7901 Long term (current) use of anticoagulants: Secondary | ICD-10-CM | POA: Diagnosis not present

## 2014-05-16 DIAGNOSIS — M1990 Unspecified osteoarthritis, unspecified site: Secondary | ICD-10-CM | POA: Diagnosis not present

## 2014-05-16 DIAGNOSIS — Z87891 Personal history of nicotine dependence: Secondary | ICD-10-CM | POA: Insufficient documentation

## 2014-05-16 DIAGNOSIS — Z9889 Other specified postprocedural states: Secondary | ICD-10-CM | POA: Insufficient documentation

## 2014-05-16 DIAGNOSIS — I255 Ischemic cardiomyopathy: Secondary | ICD-10-CM | POA: Insufficient documentation

## 2014-05-16 DIAGNOSIS — I480 Paroxysmal atrial fibrillation: Secondary | ICD-10-CM | POA: Diagnosis not present

## 2014-05-16 HISTORY — PX: CARDIOVERSION: SHX1299

## 2014-05-16 LAB — POCT I-STAT 4, (NA,K, GLUC, HGB,HCT)
Glucose, Bld: 109 mg/dL — ABNORMAL HIGH (ref 70–99)
HEMATOCRIT: 46 % (ref 39.0–52.0)
Hemoglobin: 15.6 g/dL (ref 13.0–17.0)
POTASSIUM: 4.2 mmol/L (ref 3.5–5.1)
SODIUM: 139 mmol/L (ref 135–145)

## 2014-05-16 SURGERY — CARDIOVERSION
Anesthesia: Monitor Anesthesia Care

## 2014-05-16 MED ORDER — SODIUM CHLORIDE 0.9 % IV SOLN
INTRAVENOUS | Status: DC
  Administered 2014-05-16: 500 mL via INTRAVENOUS

## 2014-05-16 MED ORDER — AMIODARONE HCL 200 MG PO TABS: 400.0000 mg | ORAL_TABLET | Freq: Two times a day (BID) | ORAL | Status: DC

## 2014-05-16 MED ORDER — AMIODARONE HCL 400 MG PO TABS: 400.0000 mg | ORAL_TABLET | Freq: Two times a day (BID) | ORAL | Status: DC

## 2014-05-16 MED ORDER — PROPOFOL 10 MG/ML IV BOLUS
INTRAVENOUS | Status: DC | PRN
  Administered 2014-05-16: 120 mg via INTRAVENOUS

## 2014-05-16 NOTE — H&P (View-Only) (Signed)
[name]. [address] [place], [place]  [address] Phone: [phone] Fax:  [phone]  Date:  05/08/2014   ID:  Norman Rodriguez, DOB 03/28/1955, MRN [id number]  PCP:  Pcp Not In System   History of Present Illness: Norman Rodriguez is a 59 y.o. 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 He was very satisfied with clinical symptoms, cardioversion, successful. QTC was 490 on discharge.  Still feeling shortness of breath at times. Does not like to take furosemide because of urination.   battled with quite severe allergies this season.  He didn't realize it but auscultation, he is in atrial fibrillation. EKG confirmed this.   Wt Readings from Last 3 Encounters:  05/08/14 279 lb (126.554 kg)  01/08/14 286 lb (129.729 kg)  09/07/13 276 lb (125.193 kg)     Past Medical History  Diagnosis Date  . Chronic systolic CHF (congestive heart failure)     a. Both ischemic and tachy induced. b. Echo 04/2013: EF 30-35%, no RWMA, normal RV, mod dilated LA (before restoration of NSR).  [name] CAD (coronary artery disease)     a. BMS 2.42mm 23 to LAD. CTO of RCA unsuccessful, good left to right collaterals - 2010.  [name] Atrial fibrillation     a. Dx 2010. b. Initiated on Tikosyn 03/2013.  [name] Chronic anticoagulation   . NSVT (nonsustained ventricular tachycardia)   . Hyperlipidemia   . HTN (hypertension)   . Obesity   . Dysrhythmia     hx of atrial fibrilation  . Shortness of breath   . Arthritis     Past Surgical History  Procedure Laterality Date  . Coronary angioplasty with stent placement  2010    BMS 2.44mm 23 to LAD. CTO of RCA unsuccessful, good left to right collaterals  . Cardioversion N/A 04/27/2013    Procedure: BEDSIDE CARDIOVERSION;  Surgeon: Sanda Klein, MD;  Location: [facility];  Service: Cardiovascular;   Laterality: N/A;  . Foot surgery Right   . Cardioversion N/A 05/15/2013    Procedure: CARDIOVERSION/BEDSIDE;  Surgeon: Larey Dresser, MD;  Location: [facility];  Service: Cardiovascular;  Laterality: N/A;    Current Outpatient Prescriptions  Medication Sig Dispense Refill  . aspirin EC 81 MG EC tablet Take 1 tablet (81 mg total) by mouth daily.    [name] atorvastatin (LIPITOR) 40 MG tablet TAKE 1 TABLET (40 MG TOTAL) BY MOUTH DAILY. 30 tablet 6  . B Complex-C (B-COMPLEX WITH VITAMIN C) tablet Take 1 tablet by mouth daily.    . carvedilol (COREG) 12.5 MG tablet Take 1 tablet (12.5 mg total) by mouth 2 (two) times daily with a meal. 180 tablet 1  . Coenzyme Q10 (CO Q-10) 200 MG CAPS Take 1 capsule by mouth daily.    [name] dofetilide (TIKOSYN) 250 MCG capsule Take 1 capsule (250 mcg total) by mouth 2 (two) times daily. 180 capsule 1  . fexofenadine (ALLEGRA) 180 MG tablet Take 180 mg by mouth daily.    . furosemide (LASIX) 40 MG tablet Take 1 tablet (40 mg total) by mouth daily. 90 tablet 3  . LevOCARNitine (CARNITINE PO) Take 500 mg by mouth daily.    [name] losartan (COZAAR) 50 MG tablet Take 0.5 tablets (25 mg total) by mouth daily. 45 tablet 0  . Magnesium 400 MG CAPS Take 1 capsule by mouth daily.     [name]  meloxicam (MOBIC) 15 MG tablet Take 15 mg by mouth daily.    . Multiple Vitamin (MULTIVITAMIN) tablet Take 1 tablet by mouth daily.    . nitroGLYCERIN (NITROSTAT) 0.4 MG SL tablet Place 0.4 mg under the tongue every 5 (five) minutes as needed for chest pain.    . NON FORMULARY (Ribose) Take 1 capsule once a day    . NON FORMULARY Glycine viatmin 1 tab po qd    . NON FORMULARY 465 mg daily. [place]    . Omega-3 Fatty Acids (OMEGA 3 PO) Take 2 capsules by mouth daily.     [name] OVER THE COUNTER MEDICATION High Active Tart Cherry 25.1 conc/465mg  takes 1 daily for joint therapy    . potassium chloride SA (K-DUR,KLOR-CON) 20 MEQ tablet TAKE 1 TABLET BY MOUTH ONCE DAILY 30 tablet 6  . rivaroxaban  (XARELTO) 20 MG TABS tablet Take 1 tablet (20 mg total) by mouth daily with supper. 90 tablet 0  . vitamin C (ASCORBIC ACID) 500 MG tablet Take 500 mg by mouth daily.    . vitamin E 400 UNIT capsule Take 400 Units by mouth daily.    [name] zinc gluconate 50 MG tablet Take 25 mg by mouth daily.      No current facility-administered medications for this visit.    Allergies:    Allergies  Allergen Reactions  . Lisinopril Cough    Social History:  The patient  reports that he has quit smoking. His smoking use included Cigars. He has never used smokeless tobacco. He reports that he does not drink alcohol or use illicit drugs.   Family History  Problem Relation Age of Onset  . Other Father     Car accident  . Coronary artery disease Neg Hx     ROS:  Please see the history of present illness.   Positive for recent chills, chest pain, shortness of breath lying down, excessive fatigue, anxiety, cough, shortness of breath with activity , muscle pain, dizziness , bad allergy and coughing getting better. No bleeding, no syncope, no orthopnea, PND   All other systems reviewed and negative.   PHYSICAL EXAM: VS:  BP 110/78 mmHg  Pulse 102  Ht 5\' 6"  (1.676 m)  Wt 279 lb (126.554 kg)  BMI 45.05 kg/m2 Well nourished, well developed, in no acute distress HEENT: normal, [place]/AT, EOMI Neck: no JVD, normal carotid upstroke, no bruit Cardiac:  normal S1, S2;  Irregularly irregular; no murmur Lungs:  clear to auscultation bilaterally, no wheezing, rhonchi or rales Abd: soft, nontender, no hepatomegaly, no bruitsobese Ext: no edema, 2+ distal pulses Skin: warm and dry GU: deferred Neuro: no focal abnormalities noted, AAO x 3  EKG:  None today    ASSESSMENT AND PLAN:  1. Paroxysmal atrial fibrillation- Was very wellcurrently controlled on dofetilide 250 mcg twice a day for approximately a year.  Fortunately, EKG today shows atrial fibrillation once again. We will set him up for cardioversion. He has not  missed any doses of Xarelto or dofetilide. Continue.  Interestingly, he did not think that he was in atrial fibrillation. Risks and benefits of cardioversion discussed. 2. Cardiomyopathy-both tachycardia as well as ischemic-continue to encourage carvedilol 12.5 twice a day.  He still continues to have shortness of breath with activity. Multifactorial from obesity, cardiomyopathy, deconditioning. I'm encouraged however that his heart rate is better controlled to allow more efficiency. 3. Chronic anticoagulation-as above. No bleeding. Recent blood work reassuring. Repeating. Xarelto.  4. Coronary artery disease-prior  cardiac catheterization reviewed. 5. Obesity-continue to encourage weight loss. 6. We will see him back in 2 weeks post cardioversion.   Signed, [name], MD [facility]  05/08/2014 4:03 PM

## 2014-05-16 NOTE — CV Procedure (Signed)
    Electrical Cardioversion Procedure Note Norman Rodriguez [id number] 12/28/1955  Procedure: Electrical Cardioversion Indications:  Atrial Fibrillation  Time Out: Verified patient identification, verified procedure,medications/allergies/relevent history reviewed, required imaging and test results available.  Performed  Procedure Details  The patient was NPO after midnight. Anesthesia was administered at the beside  by Dr.Joslin with propofol.  Cardioversion was performed with synchronized biphasic defibrillation via AP pads with 200 joules.  4 attempt(s) were performed.  The patient converted to normal sinus rhythm only for about 20 seconds. The patient tolerated the procedure well   IMPRESSION:  Unsuccessful cardioversion of atrial fibrillation I will consult Dr. Lovena Rodriguez for further suggestions  Continue Tikosyn for now Continue Anticoagulation   Norman Rodriguez, [place] 05/16/2014, 12:22 PM

## 2014-05-16 NOTE — Interval H&P Note (Signed)
History and Physical Interval Note:  05/16/2014 12:06 PM  Norman Rodriguez  has presented today for surgery, with the diagnosis of AFIB  The various methods of treatment have been discussed with the patient and family. After consideration of risks, benefits and other options for treatment, the patient has consented to  Procedure(s): CARDIOVERSION (N/A) as a surgical intervention .  The patient's history has been reviewed, patient examined, no change in status, stable for surgery.  I have reviewed the patient's chart and labs.  Questions were answered to the patient's satisfaction.     SKAINS, MARK

## 2014-05-16 NOTE — Transfer of Care (Signed)
Immediate Anesthesia Transfer of Care Note  Patient: Norman Rodriguez  Procedure(s) Performed: Procedure(s): CARDIOVERSION (N/A)  Patient Location: Endoscopy Unit  Anesthesia Type:General  Level of Consciousness: awake, alert  and oriented  Airway & Oxygen Therapy: Patient Spontanous Breathing and Patient connected to nasal cannula oxygen  Post-op Assessment: Report given to RN, Post -op Vital signs reviewed and stable and Patient moving all extremities X 4  Post vital signs: Reviewed and stable  Last Vitals:  Filed Vitals:   05/16/14 1108  BP: 151/106  Pulse:   Temp: 36.9 C  Resp: 17    Complications: No apparent anesthesia complications

## 2014-05-16 NOTE — Anesthesia Postprocedure Evaluation (Signed)
  Anesthesia Post-op Note  Patient: Norman Rodriguez  Procedure(s) Performed: Procedure(s): CARDIOVERSION (N/A)  Patient Location: Endoscopy Unit  Anesthesia Type:MAC  Level of Consciousness: awake, alert  and oriented  Airway and Oxygen Therapy: Patient Spontanous Breathing and Patient connected to nasal cannula oxygen  Post-op Pain: none  Post-op Assessment: Post-op Vital signs reviewed, Patient's Cardiovascular Status Stable, Respiratory Function Stable, Patent Airway and No signs of Nausea or vomiting  Post-op Vital Signs: Reviewed and stable  Last Vitals:  Filed Vitals:   05/16/14 1108  BP: 151/106  Pulse:   Temp: 36.9 C  Resp: 17    Complications: No apparent anesthesia complications

## 2014-05-16 NOTE — Discharge Instructions (Signed)

## 2014-05-16 NOTE — Anesthesia Preprocedure Evaluation (Addendum)
Anesthesia Evaluation  Patient identified by MRN, date of birth, ID band Patient awake    Reviewed: Allergy & Precautions, H&P , NPO status , Patient's Chart, lab work & pertinent test results, reviewed documented beta blocker date and time   Airway Mallampati: II  TM Distance: <3 FB Neck ROM: full    Dental  (+) Teeth Intact, Dental Advidsory Given   Pulmonary shortness of breath and with exertion, former smoker,  breath sounds clear to auscultation  + decreased breath sounds      Cardiovascular hypertension, On Medications + CAD and +CHF + dysrhythmias Atrial Fibrillation Rhythm:irregular Rate:Tachycardia     Neuro/Psych    GI/Hepatic   Endo/Other    Renal/GU      Musculoskeletal   Abdominal (+) + obese,   Peds  Hematology   Anesthesia Other Findings   Reproductive/Obstetrics                           Anesthesia Physical Anesthesia Plan  ASA: III  Anesthesia Plan: General   Post-op Pain Management:    Induction: Intravenous  Airway Management Planned: Mask  Additional Equipment:   Intra-op Plan:   Post-operative Plan:   Informed Consent: I have reviewed the patients History and Physical, chart, labs and discussed the procedure including the risks, benefits and alternatives for the proposed anesthesia with the patient or authorized representative who has indicated his/her understanding and acceptance.   Dental Advisory Given and Dental advisory given  Plan Discussed with: CRNA and Anesthesiologist  Anesthesia Plan Comments:        Anesthesia Quick Evaluation

## 2014-05-17 ENCOUNTER — Other Ambulatory Visit (HOSPITAL_COMMUNITY): Payer: Medicare Other

## 2014-05-17 ENCOUNTER — Encounter (HOSPITAL_COMMUNITY): Payer: Self-pay | Admitting: Cardiology

## 2014-05-20 ENCOUNTER — Telehealth: Payer: Self-pay | Admitting: Cardiology

## 2014-05-20 NOTE — Telephone Encounter (Signed)
New message      Pt want gabapentin 100mg  2 tabs at bedtime put on his med list.  He started this medication about 1-2 weeks prior to last procedure.  A neurologist put him on this medication.

## 2014-05-20 NOTE — Telephone Encounter (Signed)
Left detailed message on pts VM that we added new med gabapentin 100 mg 2 tabs po at bedtime to his current medication list.  Will send this information to pts Primary Cardiologist Dr. Marlou Porch and covering nurse for review of this message.  Left pt a message to contact the office back with any additional questions.

## 2014-05-27 ENCOUNTER — Encounter: Payer: Self-pay | Admitting: *Deleted

## 2014-05-27 ENCOUNTER — Encounter: Payer: Self-pay | Admitting: Cardiology

## 2014-05-27 ENCOUNTER — Ambulatory Visit (INDEPENDENT_AMBULATORY_CARE_PROVIDER_SITE_OTHER): Payer: Medicare Other | Admitting: Cardiology

## 2014-05-27 VITALS — BP 100/68 | HR 84 | Ht 66.0 in | Wt 280.8 lb

## 2014-05-27 DIAGNOSIS — I5022 Chronic systolic (congestive) heart failure: Secondary | ICD-10-CM | POA: Diagnosis not present

## 2014-05-27 DIAGNOSIS — E785 Hyperlipidemia, unspecified: Secondary | ICD-10-CM

## 2014-05-27 DIAGNOSIS — I2510 Atherosclerotic heart disease of native coronary artery without angina pectoris: Secondary | ICD-10-CM | POA: Diagnosis not present

## 2014-05-27 DIAGNOSIS — I10 Essential (primary) hypertension: Secondary | ICD-10-CM

## 2014-05-27 DIAGNOSIS — I480 Paroxysmal atrial fibrillation: Secondary | ICD-10-CM | POA: Diagnosis not present

## 2014-05-27 NOTE — Patient Instructions (Signed)
Medication Instructions:  Please stop your ASA. Decrease Amiodarone to 200 mg twice a day. Continue all other medications as listed.  Labwork: None  Testing/Procedures: Your physician has requested that you have a Cardioversion. Electrical Cardioversion uses a jolt of electricity to your heart either through paddles or wired patches attached to your chest. This is a controlled, usually prescheduled, procedure. This procedure is done at the hospital and you are not awake during the procedure. You usually go home the day of the procedure. Please see the instruction sheet given to you today for more information.  Follow-Up: 2 weeks after your cardioversion.  Thank you for choosing [facility]!!

## 2014-05-27 NOTE — Progress Notes (Signed)
[facility]. [address], [address] [place], [place]  [address] Phone: [phone] Fax:  [phone]  Date:  05/27/2014   ID:  CAROLYN MANISCALCO, DOB 04/16/1955, MRN [id number]  PCP:  Pcp Not In System   History of Present Illness: Norman Rodriguez is a 59 y.o. 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 He was very satisfied with clinical symptoms, cardioversion, successful. QTC was 490 on discharge. Unfortunately he reverted back to atrial fibrillation approximately 8-9 months after successful cardioversion previously. We tried to cardiovert him however this was unsuccessful on dofetilide. We stop the dofetilide and started him on amiodarone load 400 mg twice a day on 05/16/14. He has been feeling more shortness of breath with atrial fibrillation.  Still feeling shortness of breath at times. Does not like to take furosemide because of urination.   battled with quite severe allergies this season.  He didn't realize it but auscultation, he is in atrial fibrillation. EKG confirmed this. This was originally when he was in atrial fibrillation discovered prior to last cardioversion in April 2016.   Wt Readings from Last 3 Encounters:  05/27/14 280 lb 12.8 oz (127.37 kg)  05/08/14 279 lb (126.554 kg)  01/08/14 286 lb (129.729 kg)     Past Medical History  Diagnosis Date  . Chronic systolic CHF (congestive heart failure)     a. Both ischemic and tachy induced. b. Echo 04/2013: EF 30-35%, no RWMA, normal RV, mod dilated LA (before restoration of NSR).  [name] CAD (coronary artery disease)     a. BMS 2.54mm 23 to LAD. CTO of RCA unsuccessful, good left to right collaterals - 2010.  [name] Atrial fibrillation     a. Dx 2010. b. Initiated on Tikosyn 03/2013.  [name] Chronic anticoagulation   . NSVT (nonsustained ventricular tachycardia)   .  Hyperlipidemia   . HTN (hypertension)   . Obesity   . Dysrhythmia     hx of atrial fibrilation  . Shortness of breath   . Arthritis     Past Surgical History  Procedure Laterality Date  . Coronary angioplasty with stent placement  2010    BMS 2.37mm 23 to LAD. CTO of RCA unsuccessful, good left to right collaterals  . Cardioversion N/A 04/27/2013    Procedure: BEDSIDE CARDIOVERSION;  Surgeon: Sanda Klein, MD;  Location: [facility];  Service: Cardiovascular;  Laterality: N/A;  . Foot surgery Right   . Cardioversion N/A 05/15/2013    Procedure: CARDIOVERSION/BEDSIDE;  Surgeon: Larey Dresser, MD;  Location: [facility];  Service: Cardiovascular;  Laterality: N/A;  . Cardioversion N/A 05/16/2014    Procedure: CARDIOVERSION;  Surgeon: Jerline Pain, MD;  Location: [facility] ENDOSCOPY;  Service: Cardiovascular;  Laterality: N/A;    Current Outpatient Prescriptions  Medication Sig Dispense Refill  . amiodarone (PACERONE) 400 MG tablet Take 1 tablet (400 mg total) by mouth 2 (two) times daily. 60 tablet 2  . aspirin EC 81 MG EC tablet Take 1 tablet (81 mg total) by mouth daily.    [name] atorvastatin (LIPITOR) 40 MG tablet TAKE 1 TABLET (40 MG TOTAL) BY MOUTH DAILY. 30 tablet 6  . B Complex-C (B-COMPLEX WITH VITAMIN C) tablet Take 1 tablet by mouth daily.    . carvedilol (COREG) 12.5 MG tablet Take 1 tablet (12.5 mg total) by mouth 2 (two) times daily with a meal.  180 tablet 1  . Coenzyme Q10 (CO Q-10) 200 MG CAPS Take 1 capsule by mouth daily.    . fexofenadine (ALLEGRA) 180 MG tablet Take 180 mg by mouth daily.    . furosemide (LASIX) 40 MG tablet Take 1 tablet (40 mg total) by mouth daily. 90 tablet 3  . gabapentin (NEURONTIN) 100 MG capsule Take 100 mg by mouth 2 (two) times daily. Pt states he takes 2 tabs at bedtime, per his Neurologist    . LevOCARNitine (CARNITINE PO) Take 500 mg by mouth daily.    [name] losartan (COZAAR) 50 MG tablet Take 0.5 tablets (25 mg total) by mouth daily. 45 tablet 0  . Magnesium  400 MG CAPS Take 1 capsule by mouth daily.     . meloxicam (MOBIC) 15 MG tablet Take 15 mg by mouth daily.    . Multiple Vitamin (MULTIVITAMIN) tablet Take 1 tablet by mouth daily.    . nitroGLYCERIN (NITROSTAT) 0.4 MG SL tablet Place 0.4 mg under the tongue every 5 (five) minutes as needed for chest pain.    . NON FORMULARY (Ribose) Take 1 capsule once a day    . NON FORMULARY Glycine viatmin 1 tab po qd    . NON FORMULARY 465 mg daily. [place]    . Omega-3 Fatty Acids (OMEGA 3 PO) Take 2 capsules by mouth daily.     [name] OVER THE COUNTER MEDICATION High Active Tart Cherry 25.1 conc/465mg  takes 1 daily for joint therapy    . potassium chloride SA (K-DUR,KLOR-CON) 20 MEQ tablet TAKE 1 TABLET BY MOUTH ONCE DAILY 30 tablet 6  . rivaroxaban (XARELTO) 20 MG TABS tablet Take 1 tablet (20 mg total) by mouth daily with supper. 90 tablet 0  . vitamin C (ASCORBIC ACID) 500 MG tablet Take 500 mg by mouth daily.    . vitamin E 400 UNIT capsule Take 400 Units by mouth daily.    [name] zinc gluconate 50 MG tablet Take 25 mg by mouth daily.      No current facility-administered medications for this visit.    Allergies:    Allergies  Allergen Reactions  . Lisinopril Cough    Social History:  The patient  reports that he has quit smoking. His smoking use included Cigars. He has never used smokeless tobacco. He reports that he does not drink alcohol or use illicit drugs.   Family History  Problem Relation Age of Onset  . Other Father     Car accident  . Coronary artery disease Neg Hx     ROS:  Please see the history of present illness.   Positive for recent chills, chest pain, shortness of breath lying down, excessive fatigue, anxiety, cough, shortness of breath with activity , muscle pain, dizziness , bad allergy and coughing getting better. No bleeding, no syncope, no orthopnea, PND   All other systems reviewed and negative.   PHYSICAL EXAM: VS:  BP 100/68 mmHg  Pulse 84  Ht 5\' 6"   (1.676 m)  Wt 280 lb 12.8 oz (127.37 kg)  BMI 45.34 kg/m2 Well nourished, well developed, in no acute distress HEENT: normal, /AT, EOMI Neck: no JVD, normal carotid upstroke, no bruit Cardiac:  normal S1, S2;  Irregularly irregular; no murmur Lungs:  clear to auscultation bilaterally, no wheezing, rhonchi or rales Abd: soft, nontender, no hepatomegaly, no bruitsobese Ext: no edema, 2+ distal pulses Skin: warm and dry GU: deferred Neuro: no focal abnormalities noted, AAO x 3  EKG: 05/27/14-atrial  fibrillation heart rate 88.  ASSESSMENT AND PLAN:  1. Paroxysmal atrial fibrillation- he has been loaded with 400 mg of amiodarone twice a day for approximately 2 weeks. We will decrease this to 200 twice a day and set him up for attempted cardioversion once again. Cardioversion was unsuccessful on dofetilide a few weeks ago.  We will set him up for cardioversion once again. He has not missed any doses of Xarelto. Continue.  Interestingly, he did not think that he was in atrial fibrillation 2 visits ago. Risks and benefits of cardioversion discussed. 2. Cardiomyopathy-both tachycardia as well as ischemic-continue to encourage carvedilol 12.5 twice a day.  He still continues to have shortness of breath with activity. Multifactorial from obesity, cardiomyopathy, deconditioning. I'm encouraged however that his heart rate is better controlled to allow more efficiency. Blood pressure is slightly low would be from amiodarone load. 3. Chronic anticoagulation-as above. No bleeding. Recent blood work reassuring. Repeating. Xarelto.  4. Coronary artery disease-prior cardiac catheterization reviewed. 5. Obesity-continue to encourage weight loss. 6. We will see him back in 2 weeks post cardioversion.   Signed, [name], MD [facility]  05/27/2014 1:52 PM

## 2014-06-07 ENCOUNTER — Encounter (HOSPITAL_COMMUNITY): Payer: Self-pay | Admitting: *Deleted

## 2014-06-07 ENCOUNTER — Ambulatory Visit (HOSPITAL_COMMUNITY): Payer: Medicare Other | Admitting: Certified Registered Nurse Anesthetist

## 2014-06-07 ENCOUNTER — Encounter (HOSPITAL_COMMUNITY)
Admission: RE | Disposition: A | Discharge status: Home / Self Care | Payer: Self-pay | Source: Ambulatory Visit | Attending: Cardiology

## 2014-06-07 ENCOUNTER — Ambulatory Visit (HOSPITAL_COMMUNITY)
Admission: RE | Admit: 2014-06-07 | Discharge: 2014-06-07 | Disposition: A | Discharge status: Home / Self Care | Payer: Medicare Other | Source: Ambulatory Visit | Attending: Cardiology | Admitting: Cardiology

## 2014-06-07 DIAGNOSIS — Z955 Presence of coronary angioplasty implant and graft: Secondary | ICD-10-CM | POA: Diagnosis not present

## 2014-06-07 DIAGNOSIS — I10 Essential (primary) hypertension: Secondary | ICD-10-CM | POA: Diagnosis not present

## 2014-06-07 DIAGNOSIS — I4891 Unspecified atrial fibrillation: Secondary | ICD-10-CM | POA: Diagnosis present

## 2014-06-07 DIAGNOSIS — I2510 Atherosclerotic heart disease of native coronary artery without angina pectoris: Secondary | ICD-10-CM | POA: Insufficient documentation

## 2014-06-07 DIAGNOSIS — I481 Persistent atrial fibrillation: Secondary | ICD-10-CM | POA: Diagnosis not present

## 2014-06-07 DIAGNOSIS — Z87891 Personal history of nicotine dependence: Secondary | ICD-10-CM | POA: Insufficient documentation

## 2014-06-07 DIAGNOSIS — M1990 Unspecified osteoarthritis, unspecified site: Secondary | ICD-10-CM | POA: Diagnosis not present

## 2014-06-07 DIAGNOSIS — I509 Heart failure, unspecified: Secondary | ICD-10-CM | POA: Diagnosis not present

## 2014-06-07 HISTORY — PX: CARDIOVERSION: SHX1299

## 2014-06-07 LAB — POCT I-STAT 4, (NA,K, GLUC, HGB,HCT)
Glucose, Bld: 104 mg/dL — ABNORMAL HIGH (ref 65–99)
HCT: 46 % (ref 39.0–52.0)
HEMOGLOBIN: 15.6 g/dL (ref 13.0–17.0)
Potassium: 4.3 mmol/L (ref 3.5–5.1)
Sodium: 139 mmol/L (ref 135–145)

## 2014-06-07 SURGERY — CARDIOVERSION
Anesthesia: General

## 2014-06-07 MED ORDER — SODIUM CHLORIDE 0.9 % IV SOLN
INTRAVENOUS | Status: DC | PRN
  Administered 2014-06-07: 12:00:00 via INTRAVENOUS

## 2014-06-07 MED ORDER — PROPOFOL 10 MG/ML IV BOLUS
INTRAVENOUS | Status: DC | PRN
  Administered 2014-06-07: 100 mg via INTRAVENOUS

## 2014-06-07 MED ORDER — LIDOCAINE HCL (CARDIAC) 20 MG/ML IV SOLN
INTRAVENOUS | Status: DC | PRN
  Administered 2014-06-07: 100 mg via INTRAVENOUS

## 2014-06-07 MED ORDER — SODIUM CHLORIDE 0.9 % IV SOLN
INTRAVENOUS | Status: DC
  Administered 2014-06-07: 500 mL via INTRAVENOUS

## 2014-06-07 NOTE — Anesthesia Preprocedure Evaluation (Addendum)
Anesthesia Evaluation  Patient identified by MRN, date of birth, ID band Patient awake    Reviewed: Allergy & Precautions, NPO status , Patient's Chart, lab work & pertinent test results  History of Anesthesia Complications Negative for: history of anesthetic complications  Airway Mallampati: III       Dental  (+) Missing, Dental Advisory Given   Pulmonary shortness of breath, Recent URI , Resolved, former smoker,    Pulmonary exam normal       Cardiovascular hypertension, Pt. on medications and Pt. on home beta blockers + CAD, + Cardiac Stents and +CHF + dysrhythmias Atrial Fibrillation Rhythm:Irregular     Neuro/Psych    GI/Hepatic negative GI ROS, Neg liver ROS,   Endo/Other  negative endocrine ROS  Renal/GU negative Renal ROS     Musculoskeletal  (+) Arthritis -,   Abdominal   Peds  Hematology negative hematology ROS (+)   Anesthesia Other Findings   Reproductive/Obstetrics                            Anesthesia Physical Anesthesia Plan  ASA: III  Anesthesia Plan: General   Post-op Pain Management:    Induction: Intravenous  Airway Management Planned: Mask  Additional Equipment:   Intra-op Plan:   Post-operative Plan:   Informed Consent: I have reviewed the patients History and Physical, chart, labs and discussed the procedure including the risks, benefits and alternatives for the proposed anesthesia with the patient or authorized representative who has indicated his/her understanding and acceptance.   Dental advisory given  Plan Discussed with: CRNA, Anesthesiologist and Surgeon  Anesthesia Plan Comments:        Anesthesia Quick Evaluation

## 2014-06-07 NOTE — Discharge Instructions (Signed)

## 2014-06-07 NOTE — Interval H&P Note (Signed)
History and Physical Interval Note:  06/07/2014 10:47 AM  Norman Rodriguez  has presented today for surgery, with the diagnosis of AFIB  The various methods of treatment have been discussed with the patient and family. After consideration of risks, benefits and other options for treatment, the patient has consented to  Procedure(s): CARDIOVERSION (N/A) as a surgical intervention .  The patient's history has been reviewed, patient examined, no change in status, stable for surgery.  I have reviewed the patient's chart and labs.  Questions were answered to the patient's satisfaction.     Wonda Goodgame

## 2014-06-07 NOTE — CV Procedure (Signed)
    Electrical Cardioversion Procedure Note Norman Rodriguez [id number] March 27, 1955  Procedure: Electrical Cardioversion Indications:  Atrial Fibrillation  Time Out: Verified patient identification, verified procedure,medications/allergies/relevent history reviewed, required imaging and test results available.  Performed  Procedure Details  The patient was NPO after midnight. Anesthesia was administered at the beside  by Norman Rodriguez with 100mg  of propofol.  Cardioversion was performed with synchronized biphasic defibrillation via AP pads with 200 joules.  3 attempt(s) were performed.  The patient did not convert. The patient tolerated the procedure well   IMPRESSION:  Unsuccessful cardioversion of atrial fibrillation while on amiodarone post load.  Tried cardioversion last month on Tikosyn. Continue with rate control.   Norman Furbish, MD  06/07/2014, 12:27 PM

## 2014-06-07 NOTE — Transfer of Care (Signed)
Immediate Anesthesia Transfer of Care Note  Patient: Norman Rodriguez  Procedure(s) Performed: Procedure(s): CARDIOVERSION (N/A)  Patient Location: Endoscopy Unit  Anesthesia Type:MAC  Level of Consciousness: awake, alert , oriented and patient cooperative  Airway & Oxygen Therapy: Patient Spontanous Breathing and Patient connected to nasal cannula oxygen  Post-op Assessment: Report given to RN and Post -op Vital signs reviewed and stable  Post vital signs: Reviewed and stable  Last Vitals:  Filed Vitals:   06/07/14 1213  BP: 142/39  Resp:     Complications: No apparent anesthesia complications

## 2014-06-07 NOTE — H&P (View-Only) (Signed)
[facility]. [address], [place]  [address] Phone: [phone] Fax:  [phone]  Date:  05/27/2014   ID:  TALIB HEADLEY, DOB [birth year], MRN [id number]  PCP:  Pcp Not In System   History of Present Illness: Norman Rodriguez is a 59 y.o. 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 He was very satisfied with clinical symptoms, cardioversion, successful. QTC was 490 on discharge. Unfortunately he reverted back to atrial fibrillation approximately 8-9 months after successful cardioversion previously. We tried to cardiovert him however this was unsuccessful on dofetilide. We stop the dofetilide and started him on amiodarone load 400 mg twice a day on 05/16/14. He has been feeling more shortness of breath with atrial fibrillation.  Still feeling shortness of breath at times. Does not like to take furosemide because of urination.   battled with quite severe allergies this season.  He didn't realize it but auscultation, he is in atrial fibrillation. EKG confirmed this. This was originally when he was in atrial fibrillation discovered prior to last cardioversion in April 2016.   Wt Readings from Last 3 Encounters:  05/27/14 280 lb 12.8 oz (127.37 kg)  05/08/14 279 lb (126.554 kg)  01/08/14 286 lb (129.729 kg)     Past Medical History  Diagnosis Date  . Chronic systolic CHF (congestive heart failure)     a. Both ischemic and tachy induced. b. Echo 04/2013: EF 30-35%, no RWMA, normal RV, mod dilated LA (before restoration of NSR).  [name] CAD (coronary artery disease)     a. BMS 2.8mm 23 to LAD. CTO of RCA unsuccessful, good left to right collaterals - 2010.  [name] Atrial fibrillation     a. Dx 2010. b. Initiated on Tikosyn 03/2013.  [name] Chronic anticoagulation   . NSVT (nonsustained ventricular tachycardia)   .  Hyperlipidemia   . HTN (hypertension)   . Obesity   . Dysrhythmia     hx of atrial fibrilation  . Shortness of breath   . Arthritis     Past Surgical History  Procedure Laterality Date  . Coronary angioplasty with stent placement  2010    BMS 2.80mm 23 to LAD. CTO of RCA unsuccessful, good left to right collaterals  . Cardioversion N/A 04/27/2013    Procedure: BEDSIDE CARDIOVERSION;  Surgeon: Sanda Klein, MD;  Location: [facility];  Service: Cardiovascular;  Laterality: N/A;  . Foot surgery Right   . Cardioversion N/A 05/15/2013    Procedure: CARDIOVERSION/BEDSIDE;  Surgeon: Larey Dresser, MD;  Location: [facility];  Service: Cardiovascular;  Laterality: N/A;  . Cardioversion N/A 05/16/2014    Procedure: CARDIOVERSION;  Surgeon: Jerline Pain, MD;  Location: [facility] ENDOSCOPY;  Service: Cardiovascular;  Laterality: N/A;    Current Outpatient Prescriptions  Medication Sig Dispense Refill  . amiodarone (PACERONE) 400 MG tablet Take 1 tablet (400 mg total) by mouth 2 (two) times daily. 60 tablet 2  . aspirin EC 81 MG EC tablet Take 1 tablet (81 mg total) by mouth daily.    [name] atorvastatin (LIPITOR) 40 MG tablet TAKE 1 TABLET (40 MG TOTAL) BY MOUTH DAILY. 30 tablet 6  . B Complex-C (B-COMPLEX WITH VITAMIN C) tablet Take 1 tablet by mouth daily.    . carvedilol (COREG) 12.5 MG tablet Take 1 tablet (12.5 mg total) by mouth 2 (two) times daily with a meal.  180 tablet 1  . Coenzyme Q10 (CO Q-10) 200 MG CAPS Take 1 capsule by mouth daily.    . fexofenadine (ALLEGRA) 180 MG tablet Take 180 mg by mouth daily.    . furosemide (LASIX) 40 MG tablet Take 1 tablet (40 mg total) by mouth daily. 90 tablet 3  . gabapentin (NEURONTIN) 100 MG capsule Take 100 mg by mouth 2 (two) times daily. Pt states he takes 2 tabs at bedtime, per his Neurologist    . LevOCARNitine (CARNITINE PO) Take 500 mg by mouth daily.    [name] losartan (COZAAR) 50 MG tablet Take 0.5 tablets (25 mg total) by mouth daily. 45 tablet 0  . Magnesium  400 MG CAPS Take 1 capsule by mouth daily.     . meloxicam (MOBIC) 15 MG tablet Take 15 mg by mouth daily.    . Multiple Vitamin (MULTIVITAMIN) tablet Take 1 tablet by mouth daily.    . nitroGLYCERIN (NITROSTAT) 0.4 MG SL tablet Place 0.4 mg under the tongue every 5 (five) minutes as needed for chest pain.    . NON FORMULARY (Ribose) Take 1 capsule once a day    . NON FORMULARY Glycine viatmin 1 tab po qd    . NON FORMULARY 465 mg daily. [employer]    . Omega-3 Fatty Acids (OMEGA 3 PO) Take 2 capsules by mouth daily.     [name] OVER THE COUNTER MEDICATION High Active Tart Cherry 25.1 conc/465mg  takes 1 daily for joint therapy    . potassium chloride SA (K-DUR,KLOR-CON) 20 MEQ tablet TAKE 1 TABLET BY MOUTH ONCE DAILY 30 tablet 6  . rivaroxaban (XARELTO) 20 MG TABS tablet Take 1 tablet (20 mg total) by mouth daily with supper. 90 tablet 0  . vitamin C (ASCORBIC ACID) 500 MG tablet Take 500 mg by mouth daily.    . vitamin E 400 UNIT capsule Take 400 Units by mouth daily.    [name] zinc gluconate 50 MG tablet Take 25 mg by mouth daily.      No current facility-administered medications for this visit.    Allergies:    Allergies  Allergen Reactions  . Lisinopril Cough    Social History:  The patient  reports that he has quit smoking. His smoking use included Cigars. He has never used smokeless tobacco. He reports that he does not drink alcohol or use illicit drugs.   Family History  Problem Relation Age of Onset  . Other Father     Car accident  . Coronary artery disease Neg Hx     ROS:  Please see the history of present illness.   Positive for recent chills, chest pain, shortness of breath lying down, excessive fatigue, anxiety, cough, shortness of breath with activity , muscle pain, dizziness , bad allergy and coughing getting better. No bleeding, no syncope, no orthopnea, PND   All other systems reviewed and negative.   PHYSICAL EXAM: VS:  BP 100/68 mmHg  Pulse 84  Ht 5\' 6"   (1.676 m)  Wt 280 lb 12.8 oz (127.37 kg)  BMI 45.34 kg/m2 Well nourished, well developed, in no acute distress HEENT: normal, [place]/AT, EOMI Neck: no JVD, normal carotid upstroke, no bruit Cardiac:  normal S1, S2;  Irregularly irregular; no murmur Lungs:  clear to auscultation bilaterally, no wheezing, rhonchi or rales Abd: soft, nontender, no hepatomegaly, no bruitsobese Ext: no edema, 2+ distal pulses Skin: warm and dry GU: deferred Neuro: no focal abnormalities noted, AAO x 3  EKG: 05/27/14-atrial  fibrillation heart rate 88.  ASSESSMENT AND PLAN:  1. Paroxysmal atrial fibrillation- he has been loaded with 400 mg of amiodarone twice a day for approximately 2 weeks. We will decrease this to 200 twice a day and set him up for attempted cardioversion once again. Cardioversion was unsuccessful on dofetilide a few weeks ago.  We will set him up for cardioversion once again. He has not missed any doses of Xarelto. Continue.  Interestingly, he did not think that he was in atrial fibrillation 2 visits ago. Risks and benefits of cardioversion discussed. 2. Cardiomyopathy-both tachycardia as well as ischemic-continue to encourage carvedilol 12.5 twice a day.  He still continues to have shortness of breath with activity. Multifactorial from obesity, cardiomyopathy, deconditioning. I'm encouraged however that his heart rate is better controlled to allow more efficiency. Blood pressure is slightly low would be from amiodarone load. 3. Chronic anticoagulation-as above. No bleeding. Recent blood work reassuring. Repeating. Xarelto.  4. Coronary artery disease-prior cardiac catheterization reviewed. 5. Obesity-continue to encourage weight loss. 6. We will see him back in 2 weeks post cardioversion.   Signed, [name], MD [facility]  05/27/2014 1:52 PM

## 2014-06-07 NOTE — Anesthesia Postprocedure Evaluation (Signed)
  Anesthesia Post-op Note  Patient: Norman Rodriguez  Procedure(s) Performed: Procedure(s): CARDIOVERSION (N/A)  Patient Location: Endoscopy Unit  Anesthesia Type:MAC  Level of Consciousness: awake, alert , oriented and patient cooperative  Airway and Oxygen Therapy: Patient Spontanous Breathing and Patient connected to nasal cannula oxygen  Post-op Pain: none  Post-op Assessment: Post-op Vital signs reviewed, Patient's Cardiovascular Status Stable, Respiratory Function Stable, Patent Airway and No signs of Nausea or vomiting  Post-op Vital Signs: Reviewed and stable  Last Vitals:  Filed Vitals:   06/07/14 1213  BP: 749/44  Resp:     Complications: No apparent anesthesia complications

## 2014-06-10 ENCOUNTER — Encounter (HOSPITAL_COMMUNITY): Payer: Self-pay | Admitting: Cardiology

## 2014-07-01 ENCOUNTER — Encounter: Payer: Medicare Other | Admitting: Cardiology

## 2014-07-09 ENCOUNTER — Other Ambulatory Visit: Payer: Self-pay | Admitting: Cardiology

## 2014-07-12 ENCOUNTER — Encounter: Payer: Self-pay | Admitting: Cardiology

## 2014-07-15 ENCOUNTER — Encounter: Payer: Self-pay | Admitting: Internal Medicine

## 2014-07-15 ENCOUNTER — Ambulatory Visit (INDEPENDENT_AMBULATORY_CARE_PROVIDER_SITE_OTHER): Payer: Medicare Other | Admitting: Internal Medicine

## 2014-07-15 VITALS — BP 110/70 | HR 107 | Ht 66.0 in | Wt 280.2 lb

## 2014-07-15 DIAGNOSIS — I482 Chronic atrial fibrillation, unspecified: Secondary | ICD-10-CM

## 2014-07-15 DIAGNOSIS — I472 Ventricular tachycardia: Secondary | ICD-10-CM

## 2014-07-15 DIAGNOSIS — I5022 Chronic systolic (congestive) heart failure: Secondary | ICD-10-CM | POA: Diagnosis not present

## 2014-07-15 DIAGNOSIS — I10 Essential (primary) hypertension: Secondary | ICD-10-CM | POA: Diagnosis not present

## 2014-07-15 DIAGNOSIS — I480 Paroxysmal atrial fibrillation: Secondary | ICD-10-CM | POA: Diagnosis not present

## 2014-07-15 DIAGNOSIS — I2510 Atherosclerotic heart disease of native coronary artery without angina pectoris: Secondary | ICD-10-CM

## 2014-07-15 DIAGNOSIS — E669 Obesity, unspecified: Secondary | ICD-10-CM

## 2014-07-15 DIAGNOSIS — I4729 Other ventricular tachycardia: Secondary | ICD-10-CM

## 2014-07-15 MED ORDER — DIGOXIN 125 MCG PO TABS: 0.1250 mg | ORAL_TABLET | Freq: Every day | ORAL | Status: DC

## 2014-07-15 NOTE — Assessment & Plan Note (Signed)
He is asymptomatic. No change in meds.  

## 2014-07-15 NOTE — Assessment & Plan Note (Signed)
He has class 2 symptoms. Once his rate can be controlled. I would consider repeating his echo. If his EF remains depressed despite adequate rate control, then ICD insertion would be a consideration.

## 2014-07-15 NOTE — Assessment & Plan Note (Signed)
At this point, his atrial fibrillation is chronic and he will be relegated to a strategy of rate control. I have recommended her start digoxin in conjunction with his beta blocker. Will might consider a calcium channel blocker if Digoxin is ineffective. He will continue his systemic anti-coagulation.

## 2014-07-15 NOTE — Assessment & Plan Note (Signed)
His BMI is very high. I discussed the importance of weight loss. He does not seem particularly motivated.

## 2014-07-15 NOTE — Progress Notes (Signed)
HPI Norman Rodriguez returns today for followup. He is a pleasant 59 yo man with a h/o atrial fib with a tachy induced CM, who was placed on Tikosyn and has maintained NSR but then had recurrent atrial fib and has subsequently failed amiodarone. He is now on a strategy of rate control. His symptoms of palpitations are fairly mild. He has class 2B heart failure but he also has a fairly sedentary lifestyle and is morbidly obese. He has 2+ edema. He denies syncope. No other complaints.  Allergies  Allergen Reactions  . Lisinopril Cough     Current Outpatient Prescriptions  Medication Sig Dispense Refill  . atorvastatin (LIPITOR) 40 MG tablet TAKE 1 TABLET (40 MG TOTAL) BY MOUTH DAILY. 30 tablet 6  . B Complex-C (B-COMPLEX WITH VITAMIN C) tablet Take 1 tablet by mouth daily.    . carvedilol (COREG) 12.5 MG tablet Take 1 tablet (12.5 mg total) by mouth 2 (two) times daily with a meal. 180 tablet 1  . Coenzyme Q10 (CO Q-10) 200 MG CAPS Take 1 capsule by mouth daily.    . fexofenadine (ALLEGRA) 180 MG tablet Take 180 mg by mouth daily.    . furosemide (LASIX) 40 MG tablet Take 1 tablet (40 mg total) by mouth daily. 90 tablet 3  . gabapentin (NEURONTIN) 100 MG capsule Take 100 mg by mouth 2 (two) times daily. Pt states he takes 2 tabs at bedtime, per his Neurologist    . LevOCARNitine (CARNITINE PO) Take 500 mg by mouth daily.    [name] losartan (COZAAR) 50 MG tablet Take 0.5 tablets (25 mg total) by mouth daily. 45 tablet 0  . Magnesium 400 MG CAPS Take 1 capsule by mouth daily.     . meloxicam (MOBIC) 15 MG tablet Take 15 mg by mouth daily.    . Multiple Vitamin (MULTIVITAMIN) tablet Take 1 tablet by mouth daily.    . nitroGLYCERIN (NITROSTAT) 0.4 MG SL tablet Place 0.4 mg under the tongue every 5 (five) minutes as needed for chest pain.    . NON FORMULARY (Ribose) Take 1 capsule once a day    . NON FORMULARY Glycine viatmin 1 tab po qd    . NON FORMULARY 465 mg daily. [place]    .  Omega-3 Fatty Acids (OMEGA 3 PO) Take 2 capsules by mouth daily.     . potassium chloride SA (K-DUR,KLOR-CON) 20 MEQ tablet TAKE 1 TABLET BY MOUTH ONCE DAILY 30 tablet 6  . rivaroxaban (XARELTO) 20 MG TABS tablet Take 1 tablet (20 mg total) by mouth daily with supper. 90 tablet 0  . vitamin C (ASCORBIC ACID) 500 MG tablet Take 500 mg by mouth daily.    . vitamin E 400 UNIT capsule Take 400 Units by mouth daily.    . digoxin (LANOXIN) 0.125 MG tablet Take 1 tablet (0.125 mg total) by mouth daily. 90 tablet 1   No current facility-administered medications for this visit.     Past Medical History  Diagnosis Date  . Chronic systolic CHF (congestive heart failure)     a. Both ischemic and tachy induced. b. Echo 04/2013: EF 30-35%, no RWMA, normal RV, mod dilated LA (before restoration of NSR).  [name] CAD (coronary artery disease)     a. BMS 2.61mm 23 to LAD. CTO of RCA unsuccessful, good left to right collaterals - 2010.  [name] Atrial fibrillation     a. Dx 2010. b. Initiated on Tikosyn 03/2013.  [name]  Chronic anticoagulation   . NSVT (nonsustained ventricular tachycardia)   . Hyperlipidemia   . HTN (hypertension)   . Obesity   . Dysrhythmia     hx of atrial fibrilation  . Shortness of breath   . Arthritis     ROS:   All systems reviewed and negative except as noted in the HPI.   Past Surgical History  Procedure Laterality Date  . Coronary angioplasty with stent placement  2010    BMS 2.46mm 23 to LAD. CTO of RCA unsuccessful, good left to right collaterals  . Cardioversion N/A 04/27/2013    Procedure: BEDSIDE CARDIOVERSION;  Surgeon: Sanda Klein, MD;  Location: [facility];  Service: Cardiovascular;  Laterality: N/A;  . Foot surgery Right   . Cardioversion N/A 05/15/2013    Procedure: CARDIOVERSION/BEDSIDE;  Surgeon: Larey Dresser, MD;  Location: [facility];  Service: Cardiovascular;  Laterality: N/A;  . Cardioversion N/A 05/16/2014    Procedure: CARDIOVERSION;  Surgeon: Jerline Pain, MD;  Location:  [facility];  Service: Cardiovascular;  Laterality: N/A;  . Cardioversion N/A 06/07/2014    Procedure: CARDIOVERSION;  Surgeon: Jerline Pain, MD;  Location: [facility] ENDOSCOPY;  Service: Cardiovascular;  Laterality: N/A;     Family History  Problem Relation Age of Onset  . Other Father     Car accident  . Coronary artery disease Neg Hx      History   Social History  . Marital Status: Single    Spouse Name: N/A  . Number of Children: N/A  . Years of Education: N/A   Occupational History  . Not on file.   Social History Main Topics  . Smoking status: Former Smoker    Types: Cigars  . Smokeless tobacco: Never Used     Comment: 1 cigar per month states he stopped smoking  . Alcohol Use: No  . Drug Use: No     Comment: 1 per month...pt states he stopped smoking  . Sexual Activity: Not on file   Other Topics Concern  . Not on file   Social History Narrative     BP 110/70 mmHg  Pulse 107  Ht 5\' 6"  (1.676 m)  Wt 280 lb 3.2 oz (127.098 kg)  BMI 45.25 kg/m2  SpO2 96%  Physical Exam:  obese appearing middle aged man, NAD HEENT: Unremarkable Neck:  7b cm JVD, no thyromegally Back:  No CVA tenderness Lungs:  Clear with no wheezes HEART:  IRegular rate rhythm, no murmurs, no rubs, no clicks Abd:  soft, positive bowel sounds, no organomegally, no rebound, no guarding Ext:  2 plus pulses, 2+ perioheral edema, no cyanosis, no clubbing Skin:  No rashes no nodules Neuro:  CN II through XII intact, motor grossly intact  EKG - atrial fib with a RVR   Assess/Plan:

## 2014-07-15 NOTE — Assessment & Plan Note (Signed)
His blood pressure is well controlled which makes medical therapy for rate control more difficult and it is for this reason I am using digoxin initially rather than verapamil.

## 2014-07-15 NOTE — Patient Instructions (Signed)
Medication Instructions:  Your physician has recommended you make the following change in your medication: 1) START Digoxin 0.125 mg daily  Labwork: None ordered  Testing/Procedures: None ordered  Follow-Up: Your physician recommends that you schedule a follow-up appointment in: 6-8 weeks with Dr. Marlou Porch.   Any Other Special Instructions Will Be Listed Below (If Applicable).

## 2014-07-26 ENCOUNTER — Other Ambulatory Visit: Payer: Self-pay | Admitting: Cardiology

## 2014-09-04 ENCOUNTER — Other Ambulatory Visit: Payer: Self-pay | Admitting: Cardiology

## 2014-09-20 ENCOUNTER — Ambulatory Visit (INDEPENDENT_AMBULATORY_CARE_PROVIDER_SITE_OTHER): Payer: Medicare Other | Admitting: Cardiology

## 2014-09-20 ENCOUNTER — Encounter: Payer: Self-pay | Admitting: Cardiology

## 2014-09-20 VITALS — BP 130/70 | HR 78 | Ht 67.5 in | Wt 281.0 lb

## 2014-09-20 DIAGNOSIS — I429 Cardiomyopathy, unspecified: Secondary | ICD-10-CM | POA: Diagnosis not present

## 2014-09-20 DIAGNOSIS — I5022 Chronic systolic (congestive) heart failure: Secondary | ICD-10-CM | POA: Diagnosis not present

## 2014-09-20 DIAGNOSIS — I481 Persistent atrial fibrillation: Secondary | ICD-10-CM

## 2014-09-20 DIAGNOSIS — I2510 Atherosclerotic heart disease of native coronary artery without angina pectoris: Secondary | ICD-10-CM | POA: Diagnosis not present

## 2014-09-20 DIAGNOSIS — I4819 Other persistent atrial fibrillation: Secondary | ICD-10-CM

## 2014-09-20 DIAGNOSIS — I482 Chronic atrial fibrillation, unspecified: Secondary | ICD-10-CM

## 2014-09-20 DIAGNOSIS — I2583 Coronary atherosclerosis due to lipid rich plaque: Secondary | ICD-10-CM

## 2014-09-20 DIAGNOSIS — E669 Obesity, unspecified: Secondary | ICD-10-CM

## 2014-09-20 MED ORDER — DIGOXIN 125 MCG PO TABS: 0.1250 mg | ORAL_TABLET | Freq: Every day | ORAL | Status: DC

## 2014-09-20 NOTE — Patient Instructions (Signed)
Medication Instructions:  Your physician recommends that you continue on your current medications as directed. Please refer to the Current Medication list given to you today.  Testing/Procedures: Your physician has requested that you have an echocardiogram. Echocardiography is a painless test that uses sound waves to create images of your heart. It provides your doctor with information about the size and shape of your heart and how well your heart's chambers and valves are working. This procedure takes approximately one hour. There are no restrictions for this procedure.  Follow-Up: Follow up in 4 months with Dr Marlou Porch.  Thank you for choosing [facility]!!

## 2014-09-20 NOTE — Progress Notes (Signed)
[facility]. [address], [address], [place]  [address] Phone: [phone] Fax:  [phone]  Date:  09/20/2014   ID:  Norman Rodriguez, DOB 1956/01/03, MRN [id number]  PCP:  Pcp Not In System   History of Present Illness: Norman Rodriguez is a 59 y.o. 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   He failed Tikosyn, amiodarone , multiple cardioversions. We are now in rate control strategy.   complaints have been fairly consistent over the last several months, shortness of breath with activity.   Wt Readings from Last 3 Encounters:  09/20/14 281 lb (127.461 kg)  07/15/14 280 lb 3.2 oz (127.098 kg)  05/27/14 280 lb 12.8 oz (127.37 kg)     Past Medical History  Diagnosis Date  . Chronic systolic CHF (congestive heart failure)     a. Both ischemic and tachy induced. b. Echo 04/2013: EF 30-35%, no RWMA, normal RV, mod dilated LA (before restoration of NSR).  [name] CAD (coronary artery disease)     a. BMS 2.15mm 23 to LAD. CTO of RCA unsuccessful, good left to right collaterals - 2010.  [name] Atrial fibrillation     a. Dx 2010. b. Initiated on Tikosyn 03/2013.  [name] Chronic anticoagulation   . NSVT (nonsustained ventricular tachycardia)   . Hyperlipidemia   . HTN (hypertension)   . Obesity   . Dysrhythmia     hx of atrial fibrilation  . Shortness of breath   . Arthritis     Past Surgical History  Procedure Laterality Date  . Coronary angioplasty with stent placement  2010    BMS 2.18mm 23 to LAD. CTO of RCA unsuccessful, good left to right collaterals  . Cardioversion N/A 04/27/2013    Procedure: BEDSIDE CARDIOVERSION;  Surgeon: Sanda Klein, MD;  Location: [facility];  Service: Cardiovascular;  Laterality: N/A;  . Foot surgery Right   . Cardioversion N/A 05/15/2013    Procedure: CARDIOVERSION/BEDSIDE;  Surgeon: Larey Dresser, MD;  Location: [facility];   Service: Cardiovascular;  Laterality: N/A;  . Cardioversion N/A 05/16/2014    Procedure: CARDIOVERSION;  Surgeon: Jerline Pain, MD;  Location: [facility];  Service: Cardiovascular;  Laterality: N/A;  . Cardioversion N/A 06/07/2014    Procedure: CARDIOVERSION;  Surgeon: Jerline Pain, MD;  Location: [facility] ENDOSCOPY;  Service: Cardiovascular;  Laterality: N/A;    Current Outpatient Prescriptions  Medication Sig Dispense Refill  . atorvastatin (LIPITOR) 40 MG tablet TAKE 1 TABLET (40 MG TOTAL) BY MOUTH DAILY. 30 tablet 6  . B Complex-C (B-COMPLEX WITH VITAMIN C) tablet Take 1 tablet by mouth daily.    . carvedilol (COREG) 12.5 MG tablet Take 1 tablet (12.5 mg total) by mouth 2 (two) times daily with a meal. 180 tablet 1  . Coenzyme Q10 (CO Q-10) 200 MG CAPS Take 1 capsule by mouth daily.    . digoxin (LANOXIN) 0.125 MG tablet Take 1 tablet (0.125 mg total) by mouth daily. 90 tablet 1  . fexofenadine (ALLEGRA) 180 MG tablet Take 180 mg by mouth daily.    . furosemide (LASIX) 40 MG tablet Take 1 tablet (40 mg total) by mouth daily. 90 tablet 3  . gabapentin (NEURONTIN) 100 MG capsule Take 100 mg by mouth 2 (two) times daily. Pt states he takes 2 tabs at bedtime, per his Neurologist    . LevOCARNitine (CARNITINE PO) Take 500 mg  by mouth daily.    [name] losartan (COZAAR) 50 MG tablet Take 0.5 tablets (25 mg total) by mouth daily. 45 tablet 0  . Magnesium 400 MG CAPS Take 1 capsule by mouth daily.     . meloxicam (MOBIC) 15 MG tablet Take 15 mg by mouth daily.    . Multiple Vitamin (MULTIVITAMIN) tablet Take 1 tablet by mouth daily.    . nitroGLYCERIN (NITROSTAT) 0.4 MG SL tablet Place 0.4 mg under the tongue every 5 (five) minutes as needed for chest pain.    . NON FORMULARY (Ribose) Take 1 capsule once a day    . NON FORMULARY Glycine viatmin 1 tab po qd    . NON FORMULARY 465 mg daily. [place]    . Omega-3 Fatty Acids (OMEGA 3 PO) Take 2 capsules by mouth daily.     . potassium  chloride SA (K-DUR,KLOR-CON) 20 MEQ tablet TAKE 1 TABLET BY MOUTH ONCE DAILY 30 tablet 0  . vitamin C (ASCORBIC ACID) 500 MG tablet Take 500 mg by mouth daily.    Alveda Reasons 20 MG TABS tablet TAKE 1 TABLET (20 MG TOTAL) BY MOUTH DAILY WITH SUPPER. 90 tablet 4   No current facility-administered medications for this visit.    Allergies:    Allergies  Allergen Reactions  . Lisinopril Cough    Social History:  The patient  reports that he has quit smoking. His smoking use included Cigars. He has never used smokeless tobacco. He reports that he does not drink alcohol or use illicit drugs.   Family History  Problem Relation Age of Onset  . Other Father     Car accident  . Coronary artery disease Neg Hx     ROS:  Please see the history of present illness.   Positive for recent chills, chest pain, shortness of breath lying down, excessive fatigue, anxiety, cough, shortness of breath with activity , muscle pain, dizziness , bad allergy and coughing getting better. No bleeding, no syncope, no orthopnea, PND   All other systems reviewed and negative.   PHYSICAL EXAM: VS:  BP 130/70 mmHg  Pulse 78  Ht 5' 7.5" (1.715 m)  Wt 281 lb (127.461 kg)  BMI 43.34 kg/m2 Well nourished, well developed, in no acute distress HEENT: normal, [place]/AT, EOMI Neck: no JVD, normal carotid upstroke, no bruit Cardiac:  normal S1, S2;  Irregularly irregular, normal rate; no murmur Lungs:  clear to auscultation bilaterally, no wheezing, rhonchi or rales Abd: soft, nontender, no hepatomegaly, no bruitsobese Ext: no edema, 2+ distal pulses Skin: warm and dry GU: deferred Neuro: no focal abnormalities noted, AAO x 3  EKG: 05/27/14-atrial fibrillation heart rate 88.  ASSESSMENT AND PLAN:  1. Persistent atrial fibrillation- Dr. Tanna Furry note reviewed. We will continue with rate control strategy. Digoxin has been readmitted. Continue with carvedilol. Rate is under control. 2. Cardiomyopathy-both tachycardia as well as  ischemic-continue to encourage carvedilol 12.5 twice a day.  Dig. He still continues to have shortness of breath with activity. Multifactorial from obesity, cardiomyopathy, deconditioning. I'm encouraged however that his heart rate is better controlled to allow more efficiency. 3. Chronic anticoagulation-as above. No bleeding. Recent blood work reassuring. Xarelto.  4. Coronary artery disease-prior cardiac catheterization reviewed. 5. Obesity-continue to encourage weight loss. 6. We will see him back in 4 months   Signed, [name], MD [facility]  09/20/2014 2:56 PM

## 2014-09-26 ENCOUNTER — Ambulatory Visit (HOSPITAL_COMMUNITY): Payer: Medicare Other | Attending: Cardiology

## 2014-09-26 ENCOUNTER — Other Ambulatory Visit: Payer: Self-pay

## 2014-09-26 DIAGNOSIS — E669 Obesity, unspecified: Secondary | ICD-10-CM | POA: Diagnosis not present

## 2014-09-26 DIAGNOSIS — I4891 Unspecified atrial fibrillation: Secondary | ICD-10-CM | POA: Diagnosis not present

## 2014-09-26 DIAGNOSIS — I2510 Atherosclerotic heart disease of native coronary artery without angina pectoris: Secondary | ICD-10-CM | POA: Insufficient documentation

## 2014-09-26 DIAGNOSIS — I5022 Chronic systolic (congestive) heart failure: Secondary | ICD-10-CM | POA: Insufficient documentation

## 2014-09-26 DIAGNOSIS — Z87891 Personal history of nicotine dependence: Secondary | ICD-10-CM | POA: Diagnosis not present

## 2014-09-26 DIAGNOSIS — I10 Essential (primary) hypertension: Secondary | ICD-10-CM | POA: Insufficient documentation

## 2014-09-26 DIAGNOSIS — Z6841 Body Mass Index (BMI) 40.0 and over, adult: Secondary | ICD-10-CM | POA: Diagnosis not present

## 2014-09-26 DIAGNOSIS — E785 Hyperlipidemia, unspecified: Secondary | ICD-10-CM | POA: Diagnosis not present

## 2014-09-26 DIAGNOSIS — I429 Cardiomyopathy, unspecified: Secondary | ICD-10-CM | POA: Diagnosis not present

## 2014-09-26 MED ORDER — PERFLUTREN LIPID MICROSPHERE
2.0000 mL | Freq: Once | INTRAVENOUS | Status: AC
  Administered 2014-09-26: 2 mL via INTRAVENOUS

## 2014-09-26 NOTE — Progress Notes (Signed)
Post Definity 30 minute vital signs were BP 140/80, HR 78, O2 sat 96% RA .  The patient was discharged in stable conditiion without complaints. Oletta Lamas, Rodel Glaspy A

## 2014-11-06 ENCOUNTER — Other Ambulatory Visit: Payer: Self-pay | Admitting: Cardiology

## 2014-11-12 ENCOUNTER — Other Ambulatory Visit: Payer: Self-pay

## 2014-11-12 MED ORDER — POTASSIUM CHLORIDE CRYS ER 20 MEQ PO TBCR: 20.0000 meq | EXTENDED_RELEASE_TABLET | Freq: Every day | ORAL | Status: DC

## 2014-12-16 ENCOUNTER — Telehealth: Payer: Self-pay | Admitting: Cardiology

## 2014-12-16 NOTE — Telephone Encounter (Signed)
Pt calling because his neighbor had a massive heart attack and died.  Pt is asking if he had a defibrillator if it would prevent him from having a heart attack.  Discussed with pt that we do not have enough information about his neighbor to be abe to tell him whether or not a defib would have helped him.  Reviewed pt's medical history with cardiomyopathy, HTN, A Fib and EF of 25-30%, that a defibrillator could benefit him in possibly preventing sudden cardiac death.  Discussed medical history in depth and answered all questions.  Pt thanked me for my time and that I explained things so that he could understand them.  State he will think about whether he wants a defibrillator and let us know by the time of his next appointment.

## 2014-12-16 NOTE — Telephone Encounter (Signed)
New message      Pt want to know "if he had a defibulator and had a massive heart attack----would the defibulator save him"?  Someone died recently of a massive heart attack.

## 2014-12-16 NOTE — Telephone Encounter (Signed)
Left message to c/b to discuss. 

## 2014-12-16 NOTE — Telephone Encounter (Signed)
Follow Up ° °Pt returned call//  °

## 2015-01-13 ENCOUNTER — Telehealth: Payer: Self-pay | Admitting: Cardiology

## 2015-01-13 NOTE — Telephone Encounter (Signed)
No recent lipid panel in epic. Please advise on refill request.

## 2015-01-13 NOTE — Telephone Encounter (Signed)
°*  STAT* If patient is at the pharmacy, call can be transferred to refill team.   1. Which medications need to be refilled? (please list name of each medication and dose if known) Atorvastatin 40mg   2. Which pharmacy/location (including street and city if local pharmacy) is medication to be sent to?[facility]/[id number] 3. Do they need a 30 day or 90 day supply? 90 day

## 2015-01-14 ENCOUNTER — Other Ambulatory Visit: Payer: Self-pay | Admitting: *Deleted

## 2015-01-14 MED ORDER — ATORVASTATIN CALCIUM 40 MG PO TABS: ORAL_TABLET | ORAL | Status: DC

## 2015-01-14 NOTE — Telephone Encounter (Signed)
OK to refill.  Pt has an upcoming appt.  Can check lipid then.

## 2015-01-14 NOTE — Telephone Encounter (Signed)
Rx sent in

## 2015-01-30 ENCOUNTER — Encounter: Payer: Self-pay | Admitting: Cardiology

## 2015-01-30 ENCOUNTER — Ambulatory Visit (INDEPENDENT_AMBULATORY_CARE_PROVIDER_SITE_OTHER): Payer: Medicare Other | Admitting: Cardiology

## 2015-01-30 VITALS — BP 122/82 | HR 88 | Ht 67.5 in | Wt 280.8 lb

## 2015-01-30 DIAGNOSIS — Z7901 Long term (current) use of anticoagulants: Secondary | ICD-10-CM

## 2015-01-30 DIAGNOSIS — I5022 Chronic systolic (congestive) heart failure: Secondary | ICD-10-CM | POA: Diagnosis not present

## 2015-01-30 DIAGNOSIS — I480 Paroxysmal atrial fibrillation: Secondary | ICD-10-CM | POA: Diagnosis not present

## 2015-01-30 DIAGNOSIS — I2583 Coronary atherosclerosis due to lipid rich plaque: Secondary | ICD-10-CM

## 2015-01-30 DIAGNOSIS — I2510 Atherosclerotic heart disease of native coronary artery without angina pectoris: Secondary | ICD-10-CM | POA: Diagnosis not present

## 2015-01-30 DIAGNOSIS — E669 Obesity, unspecified: Secondary | ICD-10-CM

## 2015-01-30 MED ORDER — SACUBITRIL-VALSARTAN 24-26 MG PO TABS: 1.0000 | ORAL_TABLET | Freq: Two times a day (BID) | ORAL | Status: DC

## 2015-01-30 NOTE — Progress Notes (Signed)
[place]. [address] [place], [place]  [address] Phone: [phone] Fax:  [phone]  Date:  01/30/2015   ID:  Norman Rodriguez, DOB Oct 03, 1955, MRN [id number]  PCP:  Angelique Blonder, PA   History of Present Illness: Norman Rodriguez is a 60 y.o. 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   He failed Tikosyn, amiodarone , multiple cardioversions. We are now in rate control strategy.   complaints have been fairly consistent over the last several months, shortness of breath with activity.  +SOB, rare CP, anxiety, tired.    Wt Readings from Last 3 Encounters:  01/30/15 280 lb 12.8 oz (127.37 kg)  09/20/14 281 lb (127.461 kg)  07/15/14 280 lb 3.2 oz (127.098 kg)     Past Medical History  Diagnosis Date  . Chronic systolic CHF (congestive heart failure) ([place])     a. Both ischemic and tachy induced. b. Echo 04/2013: EF 30-35%, no RWMA, normal RV, mod dilated LA (before restoration of NSR).  [name] CAD (coronary artery disease)     a. BMS 2.2mm 23 to LAD. CTO of RCA unsuccessful, good left to right collaterals - 2010.  [name] Atrial fibrillation ([place])     a. Dx 2010. b. Initiated on Tikosyn 03/2013.  [name] Chronic anticoagulation   . NSVT (nonsustained ventricular tachycardia) ([place])   . Hyperlipidemia   . HTN (hypertension)   . Obesity   . Dysrhythmia     hx of atrial fibrilation  . Shortness of breath   . Arthritis     Past Surgical History  Procedure Laterality Date  . Coronary angioplasty with stent placement  2010    BMS 2.5mm 23 to LAD. CTO of RCA unsuccessful, good left to right collaterals  . Cardioversion N/A 04/27/2013    Procedure: BEDSIDE CARDIOVERSION;  Surgeon: Sanda Klein, MD;  Location: [facility];  Service: Cardiovascular;  Laterality: N/A;  . Foot surgery Right   . Cardioversion N/A 05/15/2013    Procedure: CARDIOVERSION/BEDSIDE;   Surgeon: Larey Dresser, MD;  Location: [facility];  Service: Cardiovascular;  Laterality: N/A;  . Cardioversion N/A 05/16/2014    Procedure: CARDIOVERSION;  Surgeon: Jerline Pain, MD;  Location: [facility];  Service: Cardiovascular;  Laterality: N/A;  . Cardioversion N/A 06/07/2014    Procedure: CARDIOVERSION;  Surgeon: Jerline Pain, MD;  Location: [facility] ENDOSCOPY;  Service: Cardiovascular;  Laterality: N/A;    Current Outpatient Prescriptions  Medication Sig Dispense Refill  . atorvastatin (LIPITOR) 40 MG tablet TAKE 1 TABLET (40 MG TOTAL) BY MOUTH DAILY. 90 tablet 0  . B Complex-C (B-COMPLEX WITH VITAMIN C) tablet Take 1 tablet by mouth daily.    . carvedilol (COREG) 12.5 MG tablet TAKE 1 TABLET (12.5 MG TOTAL) BY MOUTH TWO   (TWO) TIMES DAILY WITH A MEAL. 180 tablet 1  . Coenzyme Q10 (CO Q-10) 200 MG CAPS Take 1 capsule by mouth daily.    . digoxin (LANOXIN) 0.125 MG tablet Take 1 tablet (0.125 mg total) by mouth daily. 90 tablet 1  . fexofenadine (ALLEGRA) 180 MG tablet Take 180 mg by mouth daily.    . furosemide (LASIX) 40 MG tablet Take 1 tablet (40 mg total) by mouth daily. 90 tablet 3  . gabapentin (NEURONTIN) 100 MG capsule Take 100 mg by mouth 2 (two) times daily. Pt states he takes 3 tabs at bedtime, per his  Neurologist    . LevOCARNitine (CARNITINE PO) Take 500 mg by mouth daily.    [name] losartan (COZAAR) 50 MG tablet Take 0.5 tablets (25 mg total) by mouth daily. 45 tablet 0  . Magnesium 400 MG CAPS Take 1 capsule by mouth daily.     . meloxicam (MOBIC) 15 MG tablet Take 15 mg by mouth daily.    . Multiple Vitamin (MULTIVITAMIN) tablet Take 1 tablet by mouth daily.    . nitroGLYCERIN (NITROSTAT) 0.4 MG SL tablet Place 0.4 mg under the tongue every 5 (five) minutes as needed for chest pain.    . NON FORMULARY (Ribose) Take 1 capsule once a day    . NON FORMULARY Glycine viatmin 1 tab po qd    . NON FORMULARY 465 mg daily. [place]    . Omega-3 Fatty Acids (OMEGA 3 PO)  Take 2 capsules by mouth daily.     . potassium chloride SA (K-DUR,KLOR-CON) 20 MEQ tablet Take 1 tablet (20 mEq total) by mouth daily. 90 tablet 3  . vitamin C (ASCORBIC ACID) 500 MG tablet Take 500 mg by mouth daily.    Alveda Reasons 20 MG TABS tablet TAKE 1 TABLET (20 MG TOTAL) BY MOUTH DAILY WITH SUPPER. 90 tablet 4   No current facility-administered medications for this visit.    Allergies:    Allergies  Allergen Reactions  . Lisinopril Cough    Social History:  The patient  reports that he has quit smoking. His smoking use included Cigars. He has never used smokeless tobacco. He reports that he does not drink alcohol or use illicit drugs.   Family History  Problem Relation Age of Onset  . Other Father     Car accident  . Coronary artery disease Neg Hx     ROS:  Please see the history of present illness.   Positive for recent chills, chest pain, shortness of breath lying down, excessive fatigue, anxiety, cough, shortness of breath with activity , muscle pain, dizziness , bad allergy and coughing getting better. No bleeding, no syncope, no orthopnea, PND   All other systems reviewed and negative.   PHYSICAL EXAM: VS:  BP 122/82 mmHg  Pulse 88  Ht 5' 7.5" (1.715 m)  Wt 280 lb 12.8 oz (127.37 kg)  BMI 43.31 kg/m2 Well nourished, well developed, in no acute distress HEENT: normal, [place]/AT, EOMI Neck: no JVD, normal carotid upstroke, no bruit Cardiac:  normal S1, S2;  Irregularly irregular, normal rate; no murmur Lungs:  clear to auscultation bilaterally, no wheezing, rhonchi or rales Abd: soft, nontender, no hepatomegaly, no bruitsobese Ext: no edema, 2+ distal pulses Skin: warm and dry GU: deferred Neuro: no focal abnormalities noted, AAO x 3  EKG: 05/27/14-atrial fibrillation heart rate 88.  ASSESSMENT AND PLAN:  1. Persistent atrial fibrillation- Dr. Tanna Furry note reviewed. We will continue with rate control strategy. Digoxin has been readmitted. Continue with carvedilol.  Rate is under control. 2. Cardiomyopathy-both tachycardia as well as ischemic-continue to encourage carvedilol 12.5 twice a day.  Dig. He still continues to have shortness of breath with activity. Multifactorial from obesity, cardiomyopathy, deconditioning. I'm encouraged however that his heart rate is better controlled to allow more efficiency. 3.  chronic systolic heart failure-ejection fraction 25%. I will try Entresto 24/26 BID. Stop losartan 50 QD. 4. Chronic anticoagulation-as above. No bleeding. Recent blood work reassuring. Xarelto.  5. Coronary artery disease-prior cardiac catheterization reviewed. 6. Obesity-continue to encourage weight loss. 7. We will see him  back in 2-4 weeks  Signed, [name], MD [facility]  01/30/2015 3:03 PM

## 2015-01-30 NOTE — Patient Instructions (Signed)
Medication Instructions:  Please stop your Losartan and start Entresto 24/26 mg twice a day. Continue all other medications as listed.  Follow-Up: Follow up in 2 to 4 weeks with Dr Marlou Porch.  If you need a refill on your cardiac medications before your next appointment, please call your pharmacy.  Thank you for choosing [facility]!!

## 2015-02-06 ENCOUNTER — Telehealth: Payer: Self-pay | Admitting: Cardiology

## 2015-02-06 NOTE — Telephone Encounter (Signed)
Pt's medication of Delene Loll was called in for a 90 day supply with 1 refill as requested by the pt.

## 2015-02-12 ENCOUNTER — Telehealth: Payer: Self-pay | Admitting: Cardiology

## 2015-02-12 NOTE — Telephone Encounter (Signed)
New message      Pt has a cold--sore thr, runny nose and coughing up phlegm--what medication can he take?

## 2015-02-12 NOTE — Telephone Encounter (Signed)
Left message for pt - OK to use saline nasal spray/washes, Benadryl,Coricidin and plain Robitussin.  Also suggested he see his PCP for further eval and treatment if necessary.

## 2015-02-14 ENCOUNTER — Ambulatory Visit: Payer: Medicare Other | Admitting: Cardiology

## 2015-02-14 ENCOUNTER — Telehealth: Payer: Self-pay | Admitting: Cardiology

## 2015-02-14 NOTE — Telephone Encounter (Signed)
Spoke with pt about the cancellation of his appt.  Pt had been scheduled for f/u for today 1/20 at 2 pm with Dr Marlou Porch.  He is not on the schedule to be seen today and according to documentation he cancelled the appt. The pt denies this.  He did call to schedule an appt for 2/217 which is noted for 2 pm with Dr Marlou Porch.  Pt states he called to make this appt because his paperwork stated he needed to be seen around that time.  Advised pt that from his last OV he was instructed to f/u in 2 to 4 weeks.  The appt was made for 1/20 which would have been 2 weeks.  The only reason the 02/27/15 note was on his paperwork is because it was within the time frame of which he needed to be seen back in follow up.  His 1/20 appt was cancelled because he scheduled the appt for 02/27/15 and 2 appts were not needed.

## 2015-02-14 NOTE — Telephone Encounter (Signed)
New message     Patient calling wants to talk with nurse regarding his cancel appt that was previous schedule for today 1/20  @ 2pm .

## 2015-02-27 ENCOUNTER — Encounter: Payer: Self-pay | Admitting: Cardiology

## 2015-02-27 ENCOUNTER — Ambulatory Visit (INDEPENDENT_AMBULATORY_CARE_PROVIDER_SITE_OTHER): Payer: Medicare Other | Admitting: Cardiology

## 2015-02-27 VITALS — BP 110/62 | HR 52 | Ht 67.5 in | Wt 272.2 lb

## 2015-02-27 DIAGNOSIS — I4819 Other persistent atrial fibrillation: Secondary | ICD-10-CM

## 2015-02-27 DIAGNOSIS — I2583 Coronary atherosclerosis due to lipid rich plaque: Secondary | ICD-10-CM

## 2015-02-27 DIAGNOSIS — I481 Persistent atrial fibrillation: Secondary | ICD-10-CM

## 2015-02-27 DIAGNOSIS — Z7901 Long term (current) use of anticoagulants: Secondary | ICD-10-CM

## 2015-02-27 DIAGNOSIS — R0602 Shortness of breath: Secondary | ICD-10-CM | POA: Diagnosis not present

## 2015-02-27 DIAGNOSIS — I2510 Atherosclerotic heart disease of native coronary artery without angina pectoris: Secondary | ICD-10-CM

## 2015-02-27 NOTE — Patient Instructions (Signed)
Medication Instructions:  The current medical regimen is effective;  continue present plan and medications.  Follow-Up: Follow up in 4 months with Dr Skains.  If you need a refill on your cardiac medications before your next appointment, please call your pharmacy.  Thank you for choosing [facility]!!     

## 2015-02-27 NOTE — Progress Notes (Signed)
[place]. [address], [address] [place],   [address] Phone: [phone] Fax:  [phone]  Date:  02/27/2015   ID:  Norman Rodriguez, DOB 1955/08/26, MRN [id number]  PCP:  Norman Blonder, PA   History of Present Illness: Norman Rodriguez is a 60 y.o. 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   He failed Tikosyn, amiodarone , multiple cardioversions. We are now in rate control strategy.   complaints have been fairly consistent over the last several months, shortness of breath with activity. Since starting interest to, he has felt a little better. He is also down about 8 pounds. He had a pretty significant upper respiratory infection recently.  +SOB, rare CP, anxiety, tired.    Wt Readings from Last 3 Encounters:  02/27/15 272 lb 3.2 oz (123.469 kg)  01/30/15 280 lb 12.8 oz (127.37 kg)  09/20/14 281 lb (127.461 kg)     Past Medical History  Diagnosis Date  . Chronic systolic CHF (congestive heart failure) ([place])     a. Both ischemic and tachy induced. b. Echo 04/2013: EF 30-35%, no RWMA, normal RV, mod dilated LA (before restoration of NSR).  [name] Norman Rodriguez (coronary artery disease)     a. BMS 2.70mm 23 to LAD. CTO of RCA unsuccessful, good left to right collaterals - 2010.  [name] [name] ([place])     a. Dx 2010. b. Initiated on Tikosyn 03/2013.  [name] Chronic anticoagulation   . NSVT (nonsustained ventricular tachycardia) ([place])   . Norman Rodriguez   . [name]   . Obesity   . Dysrhythmia     hx of atrial fibrilation  . Shortness of breath   . Arthritis     Past Surgical History  Procedure Laterality Date  . Coronary angioplasty with stent placement  2010    BMS 2.54mm 23 to LAD. CTO of RCA unsuccessful, good left to right collaterals  . Cardioversion N/A 04/27/2013    Procedure: BEDSIDE CARDIOVERSION;  Surgeon: Norman Klein, MD;   Location: [facility];  Service: Cardiovascular;  Laterality: N/A;  . Foot surgery Right   . Cardioversion N/A 05/15/2013    Procedure: CARDIOVERSION/BEDSIDE;  Surgeon: Norman Dresser, MD;  Location: [facility];  Service: Cardiovascular;  Laterality: N/A;  . Cardioversion N/A 05/16/2014    Procedure: CARDIOVERSION;  Surgeon: Norman Pain, MD;  Location: [facility];  Service: Cardiovascular;  Laterality: N/A;  . Cardioversion N/A 06/07/2014    Procedure: CARDIOVERSION;  Surgeon: Norman Pain, MD;  Location: [facility] ENDOSCOPY;  Service: Cardiovascular;  Laterality: N/A;    Current Outpatient Prescriptions  Medication Sig Dispense Refill  . atorvastatin (LIPITOR) 40 MG tablet TAKE 1 TABLET (40 MG TOTAL) BY MOUTH DAILY. 90 tablet 0  . B Complex-C (B-COMPLEX WITH VITAMIN C) tablet Take 1 tablet by mouth daily.    . carvedilol (COREG) 12.5 MG tablet TAKE 1 TABLET (12.5 MG TOTAL) BY MOUTH TWO   (TWO) TIMES DAILY WITH A MEAL. 180 tablet 1  . Coenzyme Q10 (CO Q-10) 200 MG CAPS Take 1 capsule by mouth daily.    . digoxin (LANOXIN) 0.125 MG tablet Take 1 tablet (0.125 mg total) by mouth daily. 90 tablet 1  . fexofenadine (ALLEGRA) 180 MG tablet Take 180 mg by mouth daily.    . furosemide (LASIX) 40 MG tablet Take 1 tablet (40 mg total) by mouth daily. 90 tablet 3  .  gabapentin (NEURONTIN) 100 MG capsule Take 100 mg by mouth 2 (two) times daily. Pt states he takes 3 tabs at bedtime, per his Neurologist    . LevOCARNitine (CARNITINE PO) Take 500 mg by mouth daily.    . Magnesium 400 MG CAPS Take 1 capsule by mouth daily.     . meloxicam (MOBIC) 15 MG tablet Take 15 mg by mouth daily.    . Multiple Vitamin (MULTIVITAMIN) tablet Take 1 tablet by mouth daily.    . nitroGLYCERIN (NITROSTAT) 0.4 MG SL tablet Place 0.4 mg under the tongue every 5 (five) minutes as needed for chest Rodriguez.    . NON FORMULARY (Ribose) Take 1 capsule once a day    . NON FORMULARY Glycine viatmin 1 tab po qd    . NON FORMULARY 465 mg daily.  [place]    . Omega-3 Fatty Acids (OMEGA 3 PO) Take 2 capsules by mouth daily.     . potassium chloride SA (K-DUR,KLOR-CON) 20 MEQ tablet Take 1 tablet (20 mEq total) by mouth daily. 90 tablet 3  . sacubitril-valsartan (ENTRESTO) 24-26 MG Take 1 tablet by mouth 2 (two) times daily. 60 tablet 6  . vitamin C (ASCORBIC ACID) 500 MG tablet Take 500 mg by mouth daily.    Norman Rodriguez 20 MG TABS tablet TAKE 1 TABLET (20 MG TOTAL) BY MOUTH DAILY WITH SUPPER. 90 tablet 4   No current facility-administered medications for this visit.    Allergies:    Allergies  Allergen Reactions  . Lisinopril Cough    Social History:  The patient  reports that he has quit smoking. His smoking use included Cigars. He has never used smokeless tobacco. He reports that he does not drink alcohol or use illicit drugs.   Family History  Problem Relation Age of Onset  . Other Father     Car accident  . Coronary artery disease Neg Hx     ROS:  Please see the history of present illness.   No chest Rodriguez, no syncope. Mild dizziness. Recent head cold.   All other systems reviewed and negative.   PHYSICAL EXAM: VS:  BP 110/62 mmHg  Pulse 52  Ht 5' 7.5" (1.715 m)  Wt 272 lb 3.2 oz (123.469 kg)  BMI 41.98 kg/m2 Well nourished, well developed, in no acute distress HEENT: normal, /AT, EOMI Neck: no JVD, normal carotid upstroke, no bruit Cardiac:  normal S1, S2;  Irregularly irregular, normal rate; no murmur Lungs:  clear to auscultation bilaterally, no wheezing, rhonchi or rales Abd: soft, nontender, no hepatomegaly, no bruitsobese Ext: no edema, 2+ distal pulses Skin: warm and dry GU: deferred Neuro: no focal abnormalities noted, AAO x 3  EKG: 05/27/14-[name] heart rate 88.  ASSESSMENT AND PLAN:  1. Norman Rodriguez- Dr. Tanna Rodriguez note reviewed. We will continue with rate control strategy. Digoxin has been readmitted. Continue with carvedilol. Rate is under  control. 2. Cardiomyopathy-both tachycardia as well as ischemic-continue to encourage carvedilol 12.5 twice a day.  Dig. Doing better with Entresto. Multifactorial from obesity, cardiomyopathy, deconditioning. I'm encouraged however that his heart rate is better controlled to allow more efficiency. 3.  chronic systolic heart failure-ejection fraction 25%.  Entresto 24/26 BID. Stopped losartan 50 QD. 4. Chronic anticoagulation-as above. No bleeding. Recent blood work reassuring. Xarelto.  5. Coronary artery disease-prior cardiac catheterization reviewed. 6. Obesity-continue to encourage weight loss. 7. We will see him back in 4 months  Signed, Norman Furbish, MD [facility]  02/27/2015 2:09  PM

## 2015-03-11 ENCOUNTER — Other Ambulatory Visit: Payer: Self-pay | Admitting: Cardiology

## 2015-03-13 ENCOUNTER — Telehealth: Payer: Self-pay | Admitting: *Deleted

## 2015-03-13 NOTE — Telephone Encounter (Signed)
I would prefer if he not take mobile and Xarelto together as this will potentially increase his bleeding risk. Tylenol is recommended for pain.  [name], MD

## 2015-03-13 NOTE — Telephone Encounter (Signed)
Will have Dr Marlou Porch review and advise.

## 2015-03-13 NOTE — Telephone Encounter (Signed)
Pt is aware of recommendation to not to take Mobic and Xarelto together d/t the increase in risk of bleeding.

## 2015-03-20 ENCOUNTER — Telehealth: Payer: Self-pay | Admitting: Cardiology

## 2015-03-20 NOTE — Telephone Encounter (Signed)
LMTCB

## 2015-03-20 NOTE — Telephone Encounter (Signed)
Pt said he can not take Entresto any longer,today it is his last day taking it. Please call to advise.

## 2015-03-21 NOTE — Telephone Encounter (Signed)
Calling stating Dr. Marlou Porch prescribed Delene Loll when he was seen on 02/27/15 and was told to stop Losartan.  He states soon after starting taking the [facility] he started having "copious" amounts of diarrhea.  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  He stopped taking the [facility] Wednesday and has not had any diarrhea since.  He started back on the Losartan 50 mg (1/2) tablet yesterday. He states he asked the pharmacist at [facility] but he said he couldn't find that as a side effect of Entresto but should stop taking if that was causing him diarrhea. I also spoke w/Megan Supple, Pharm who also states she could not find that as a side effect.  Advised Mr. Mcclarnon that would send to Dr. Marlou Porch to make him aware that you stopped the [facility] and had restarted the Losartan and if he wanted to make any changes.  He states he has at least a week or more of the Losartan.  Advised that someone would call him once Dr.Skains had responded with his recommendations. Advised Dr. Marlou Porch was out of the office today and would be next week before someone got in touch with him. He verbalizes understanding.

## 2015-04-11 ENCOUNTER — Other Ambulatory Visit: Payer: Self-pay | Admitting: Cardiology

## 2015-06-27 ENCOUNTER — Ambulatory Visit: Payer: Medicare Other | Admitting: Cardiology

## 2015-07-10 ENCOUNTER — Ambulatory Visit (INDEPENDENT_AMBULATORY_CARE_PROVIDER_SITE_OTHER): Payer: Medicare Other | Admitting: Cardiology

## 2015-07-10 ENCOUNTER — Encounter: Payer: Self-pay | Admitting: Cardiology

## 2015-07-10 VITALS — BP 126/84 | HR 94 | Ht 68.0 in | Wt 275.8 lb

## 2015-07-10 DIAGNOSIS — Z7901 Long term (current) use of anticoagulants: Secondary | ICD-10-CM | POA: Diagnosis not present

## 2015-07-10 DIAGNOSIS — R0602 Shortness of breath: Secondary | ICD-10-CM

## 2015-07-10 DIAGNOSIS — I481 Persistent atrial fibrillation: Secondary | ICD-10-CM | POA: Diagnosis not present

## 2015-07-10 DIAGNOSIS — I2510 Atherosclerotic heart disease of native coronary artery without angina pectoris: Secondary | ICD-10-CM

## 2015-07-10 DIAGNOSIS — I4819 Other persistent atrial fibrillation: Secondary | ICD-10-CM

## 2015-07-10 DIAGNOSIS — I5022 Chronic systolic (congestive) heart failure: Secondary | ICD-10-CM

## 2015-07-10 DIAGNOSIS — I10 Essential (primary) hypertension: Secondary | ICD-10-CM

## 2015-07-10 DIAGNOSIS — I2583 Coronary atherosclerosis due to lipid rich plaque: Secondary | ICD-10-CM

## 2015-07-10 NOTE — Patient Instructions (Signed)
Medication Instructions:  Please stop your Digoxin. Take Furosemide 40 mg twice a day for 3 days then resume once a day dose. Continue all other medications as listed.  Follow-Up: Follow up in 6 months with Dr. Marlou Porch.  You will receive a letter in the mail 2 months before you are due.  Please call us when you receive this letter to schedule your follow up appointment.  If you need a refill on your cardiac medications before your next appointment, please call your pharmacy.  Thank you for choosing [facility]!!

## 2015-07-10 NOTE — Progress Notes (Signed)
[facility]. [address], [place]  [address] Phone: [phone] Fax:  [phone]  Date:  07/10/2015   ID:  Norman Rodriguez, DOB 12-03-1955, MRN [id number]  PCP:  Red Christians, MD   History of Present Illness: Norman Rodriguez is a 60 y.o. 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   He failed Tikosyn, amiodarone , multiple cardioversions. We are now in rate control strategy.   complaints have been fairly consistent over the last several visits, shortness of breath with activity. When gardening, he felt some shortness of breath.  Wt Readings from Last 3 Encounters:  07/10/15 275 lb 12.8 oz (125.102 kg)  02/27/15 272 lb 3.2 oz (123.469 kg)  01/30/15 280 lb 12.8 oz (127.37 kg)     Past Medical History  Diagnosis Date  . Chronic systolic CHF (congestive heart failure) ([place])     a. Both ischemic and tachy induced. b. Echo 04/2013: EF 30-35%, no RWMA, normal RV, mod dilated LA (before restoration of NSR).  [name] CAD (coronary artery disease)     a. BMS 2.69mm 23 to LAD. CTO of RCA unsuccessful, good left to right collaterals - 2010.  [name] Atrial fibrillation ([place])     a. Dx 2010. b. Initiated on Tikosyn 03/2013.  [name] Chronic anticoagulation   . NSVT (nonsustained ventricular tachycardia) ([place])   . Hyperlipidemia   . HTN (hypertension)   . Obesity   . Dysrhythmia     hx of atrial fibrilation  . Shortness of breath   . Arthritis     Past Surgical History  Procedure Laterality Date  . Coronary angioplasty with stent placement  2010    BMS 2.63mm 23 to LAD. CTO of RCA unsuccessful, good left to right collaterals  . Cardioversion N/A 04/27/2013    Procedure: BEDSIDE CARDIOVERSION;  Surgeon: Sanda Klein, MD;  Location: [facility];  Service: Cardiovascular;  Laterality: N/A;  . Foot surgery Right   . Cardioversion N/A 05/15/2013   Procedure: CARDIOVERSION/BEDSIDE;  Surgeon: Larey Dresser, MD;  Location: [facility];  Service: Cardiovascular;  Laterality: N/A;  . Cardioversion N/A 05/16/2014    Procedure: CARDIOVERSION;  Surgeon: Jerline Pain, MD;  Location: [facility];  Service: Cardiovascular;  Laterality: N/A;  . Cardioversion N/A 06/07/2014    Procedure: CARDIOVERSION;  Surgeon: Jerline Pain, MD;  Location: [facility] ENDOSCOPY;  Service: Cardiovascular;  Laterality: N/A;    Current Outpatient Prescriptions  Medication Sig Dispense Refill  . atorvastatin (LIPITOR) 40 MG tablet TAKE 1 TABLET (40 MG TOTAL) BY MOUTH DAILY. 90 tablet 3  . B Complex-C (B-COMPLEX WITH VITAMIN C) tablet Take 1 tablet by mouth daily.    . carvedilol (COREG) 12.5 MG tablet TAKE 1 TABLET (12.5 MG TOTAL) BY MOUTH TWO   (TWO) TIMES DAILY WITH A MEAL. 180 tablet 1  . Coenzyme Q10 (CO Q-10) 200 MG CAPS Take 1 capsule by mouth daily.    . digoxin (LANOXIN) 0.125 MG tablet TAKE 1 TABLET (0.125 MG TOTAL) BY MOUTH DAILY. 90 tablet 0  . fexofenadine (ALLEGRA) 180 MG tablet Take 180 mg by mouth daily.    . furosemide (LASIX) 40 MG tablet Take 1 tablet (40 mg total) by mouth daily. 90 tablet 3  . gabapentin (NEURONTIN) 100 MG capsule Take 500 mg by mouth at bedtime. Patient takes 5-6 tablets by mouth at bedtime    .  LevOCARNitine (CARNITINE PO) Take 500 mg by mouth daily.    . Magnesium 400 MG CAPS Take 1 capsule by mouth daily.     . meloxicam (MOBIC) 15 MG tablet Take 15 mg by mouth daily.    . Multiple Vitamin (MULTIVITAMIN) tablet Take 1 tablet by mouth daily.    . nitroGLYCERIN (NITROSTAT) 0.4 MG SL tablet Place 0.4 mg under the tongue every 5 (five) minutes as needed for chest pain.    . NON FORMULARY (Ribose) Take 1 capsule once a day    . NON FORMULARY Take 1 tablet by mouth daily. Glycine viatmin 1 tab po qd    . NON FORMULARY 465 mg daily. [place]    . Omega-3 Fatty Acids (OMEGA 3 PO) Take 2 capsules by mouth daily.     . potassium  chloride SA (K-DUR,KLOR-CON) 20 MEQ tablet Take 1 tablet (20 mEq total) by mouth daily. 90 tablet 3  . sacubitril-valsartan (ENTRESTO) 24-26 MG Take 1 tablet by mouth 2 (two) times daily.    . vitamin C (ASCORBIC ACID) 500 MG tablet Take 500 mg by mouth daily.    Alveda Reasons 20 MG TABS tablet TAKE 1 TABLET (20 MG TOTAL) BY MOUTH DAILY WITH SUPPER. 90 tablet 4   No current facility-administered medications for this visit.    Allergies:    Allergies  Allergen Reactions  . Lisinopril Cough    Social History:  The patient  reports that he has quit smoking. His smoking use included Cigars. He has never used smokeless tobacco. He reports that he does not drink alcohol or use illicit drugs.   Family History  Problem Relation Age of Onset  . Other Father     Car accident  . Coronary artery disease Neg Hx     ROS:  Please see the history of present illness.   No chest pain, no syncope. Mild dizziness. Recent head cold.   All other systems reviewed and negative.   PHYSICAL EXAM: VS:  BP 126/84 mmHg  Pulse 94  Ht 5\' 8"  (1.727 m)  Wt 275 lb 12.8 oz (125.102 kg)  BMI 41.94 kg/m2 Well nourished, well developed, in no acute distress HEENT: normal, [place]/AT, EOMI Neck: no JVD, normal carotid upstroke, no bruit Cardiac:  normal S1, S2;  Irregularly irregular, normal rate; no murmur Lungs:  clear to auscultation bilaterally, no wheezing, rhonchi or rales Abd: soft, nontender, no hepatomegaly, no bruitsobese Ext: no edema, 2+ distal pulses Skin: warm and dry GU: deferred Neuro: no focal abnormalities noted, AAO x 3  EKG: EKG ordered today 07/10/15 shows atrial fibrillation heart rate 94 with PVC, nonspecific ST-T wave changes personally viewed-prior 05/27/14-atrial fibrillation heart rate 88.  ASSESSMENT AND PLAN:  1. Persistent atrial fibrillation- Dr. Tanna Furry note reviewed. We will continue with rate control strategy. Continue with carvedilol. Rate is under control. 2. Cardiomyopathy-both  tachycardia as well as ischemic-continue to encourage carvedilol 12.5 twice a day.   Doing better with Entresto. Multifactorial from obesity, cardiomyopathy, deconditioning. I'm encouraged however that his heart rate is better controlled to allow more efficiency. 3. Chronic systolic heart failure-ejection fraction 25%.  He stopped Entresto 24/26 BID (worried it was causing diarrhea) but has subsequently restarted it. Previously on losartan 50 QD. We stopped digoxin. We will consider increasing Entresto dose at one point. 4. Chronic anticoagulation-as above. No bleeding. Recent blood work reassuring. Xarelto.  5. Coronary artery disease-prior cardiac catheterization reviewed. 6. Obesity-continue to encourage weight loss. 7. We will see  him back in 6 months  Signed, [name], MD [facility]  07/10/2015 1:58 PM

## 2015-08-19 ENCOUNTER — Other Ambulatory Visit: Payer: Self-pay | Admitting: Cardiology

## 2015-08-19 ENCOUNTER — Other Ambulatory Visit: Payer: Self-pay | Admitting: *Deleted

## 2015-08-19 MED ORDER — FUROSEMIDE 40 MG PO TABS: 40.0000 mg | ORAL_TABLET | Freq: Every day | ORAL | 3 refills | Status: DC

## 2015-09-17 ENCOUNTER — Other Ambulatory Visit: Payer: Self-pay | Admitting: Cardiology

## 2015-11-17 ENCOUNTER — Other Ambulatory Visit: Payer: Self-pay | Admitting: *Deleted

## 2015-11-17 ENCOUNTER — Other Ambulatory Visit: Payer: Self-pay | Admitting: Cardiology

## 2015-11-17 MED ORDER — SACUBITRIL-VALSARTAN 24-26 MG PO TABS: 1.0000 | ORAL_TABLET | Freq: Two times a day (BID) | ORAL | 2 refills | Status: DC

## 2015-11-17 MED ORDER — CARVEDILOL 12.5 MG PO TABS: ORAL_TABLET | ORAL | 2 refills | Status: DC

## 2015-12-06 ENCOUNTER — Other Ambulatory Visit: Payer: Self-pay | Admitting: Cardiology

## 2015-12-15 ENCOUNTER — Encounter: Payer: Self-pay | Admitting: *Deleted

## 2015-12-24 ENCOUNTER — Ambulatory Visit (INDEPENDENT_AMBULATORY_CARE_PROVIDER_SITE_OTHER): Payer: Medicare Other | Admitting: Cardiology

## 2015-12-24 ENCOUNTER — Encounter (INDEPENDENT_AMBULATORY_CARE_PROVIDER_SITE_OTHER): Payer: Self-pay

## 2015-12-24 ENCOUNTER — Encounter: Payer: Self-pay | Admitting: Cardiology

## 2015-12-24 VITALS — BP 114/68 | HR 102 | Ht 67.5 in | Wt 272.0 lb

## 2015-12-24 DIAGNOSIS — R0602 Shortness of breath: Secondary | ICD-10-CM

## 2015-12-24 DIAGNOSIS — I482 Chronic atrial fibrillation: Secondary | ICD-10-CM

## 2015-12-24 DIAGNOSIS — I2589 Other forms of chronic ischemic heart disease: Secondary | ICD-10-CM | POA: Diagnosis not present

## 2015-12-24 DIAGNOSIS — I4821 Permanent atrial fibrillation: Secondary | ICD-10-CM

## 2015-12-24 DIAGNOSIS — Z7901 Long term (current) use of anticoagulants: Secondary | ICD-10-CM

## 2015-12-24 DIAGNOSIS — I255 Ischemic cardiomyopathy: Secondary | ICD-10-CM | POA: Diagnosis not present

## 2015-12-24 DIAGNOSIS — M79604 Pain in right leg: Secondary | ICD-10-CM

## 2015-12-24 DIAGNOSIS — I5022 Chronic systolic (congestive) heart failure: Secondary | ICD-10-CM

## 2015-12-24 DIAGNOSIS — M79605 Pain in left leg: Secondary | ICD-10-CM

## 2015-12-24 LAB — BASIC METABOLIC PANEL
BUN: 13 mg/dL (ref 7–25)
CHLORIDE: 102 mmol/L (ref 98–110)
CO2: 27 mmol/L (ref 20–31)
Calcium: 9.9 mg/dL (ref 8.6–10.3)
Creat: 0.95 mg/dL (ref 0.70–1.25)
Glucose, Bld: 119 mg/dL — ABNORMAL HIGH (ref 65–99)
POTASSIUM: 4.7 mmol/L (ref 3.5–5.3)
SODIUM: 139 mmol/L (ref 135–146)

## 2015-12-24 LAB — CBC WITH DIFFERENTIAL/PLATELET
BASOS PCT: 0 %
Basophils Absolute: 0 cells/uL (ref 0–200)
EOS PCT: 1 %
Eosinophils Absolute: 82 cells/uL (ref 15–500)
HCT: 51.3 % — ABNORMAL HIGH (ref 38.5–50.0)
Hemoglobin: 17.4 g/dL — ABNORMAL HIGH (ref 13.2–17.1)
LYMPHS PCT: 29 %
Lymphs Abs: 2378 cells/uL (ref 850–3900)
MCH: 32.1 pg (ref 27.0–33.0)
MCHC: 33.9 g/dL (ref 32.0–36.0)
MCV: 94.6 fL (ref 80.0–100.0)
MONO ABS: 738 {cells}/uL (ref 200–950)
MONOS PCT: 9 %
MPV: 11.4 fL (ref 7.5–12.5)
Neutro Abs: 5002 cells/uL (ref 1500–7800)
Neutrophils Relative %: 61 %
PLATELETS: 268 10*3/uL (ref 140–400)
RBC: 5.42 MIL/uL (ref 4.20–5.80)
RDW: 13.4 % (ref 11.0–15.0)
WBC: 8.2 10*3/uL (ref 3.8–10.8)

## 2015-12-24 NOTE — Progress Notes (Signed)
[facility]. [address] [place], [place]  [address] Phone: [phone] Fax:  [phone]  Date:  12/24/2015   ID:  Norman Rodriguez, DOB January 11, 1956, MRN [id number]  PCP:  Red Christians, MD   History of Present Illness: Norman Rodriguez is a 60 y.o. 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   He failed Tikosyn, amiodarone , multiple cardioversions. We are now in rate control strategy.  Complaints have been fairly consistent over the last several visits, shortness of breath with activity.   He wonders about COPD. He used to smoke for several years but quit about 10 years ago. He also worries about peripheral vascular disease. He does have leg pain mostly at rest. Difficult for me to palpate distal pulses.  Wt Readings from Last 3 Encounters:  12/24/15 272 lb (123.4 kg)  07/10/15 275 lb 12.8 oz (125.1 kg)  02/27/15 272 lb 3.2 oz (123.5 kg)     Past Medical History:  Diagnosis Date  . Arthritis   . Atrial fibrillation ([place])    a. Dx 2010. b. Initiated on Tikosyn 03/2013.  [name] CAD (coronary artery disease)    a. BMS 2.104mm 23 to LAD. CTO of RCA unsuccessful, good left to right collaterals - 2010.  [name] Chronic anticoagulation   . Chronic systolic CHF (congestive heart failure) ([place])    a. Both ischemic and tachy induced. b. Echo 04/2013: EF 30-35%, no RWMA, normal RV, mod dilated LA (before restoration of NSR).  [name] Dysrhythmia    hx of atrial fibrilation  . HTN (hypertension)   . Hyperlipidemia   . NSVT (nonsustained ventricular tachycardia) ([place])   . Obesity   . Shortness of breath     Past Surgical History:  Procedure Laterality Date  . CARDIOVERSION N/A 04/27/2013   Procedure: BEDSIDE CARDIOVERSION;  Surgeon: Sanda Klein, MD;  Location: [facility];  Service: Cardiovascular;  Laterality: N/A;  . CARDIOVERSION N/A 05/15/2013   Procedure: CARDIOVERSION/BEDSIDE;  Surgeon: Larey Dresser, MD;  Location: [facility];  Service: Cardiovascular;  Laterality: N/A;  . CARDIOVERSION N/A 05/16/2014   Procedure: CARDIOVERSION;  Surgeon: Jerline Pain, MD;  Location: [facility];  Service: Cardiovascular;  Laterality: N/A;  . CARDIOVERSION N/A 06/07/2014   Procedure: CARDIOVERSION;  Surgeon: Jerline Pain, MD;  Location: [facility];  Service: Cardiovascular;  Laterality: N/A;  . CORONARY ANGIOPLASTY WITH STENT PLACEMENT  2010   BMS 2.7mm 23 to LAD. CTO of RCA unsuccessful, good left to right collaterals  . FOOT SURGERY Right     Current Outpatient Prescriptions  Medication Sig Dispense Refill  . atorvastatin (LIPITOR) 40 MG tablet TAKE 1 TABLET (40 MG TOTAL) BY MOUTH DAILY. 90 tablet 3  . B Complex-C (B-COMPLEX WITH VITAMIN C) tablet Take 1 tablet by mouth daily.    . carvedilol (COREG) 12.5 MG tablet TAKE 1 TABLET (12.5 MG TOTAL) BY MOUTH TWO TIMES DAILY WITH A MEAL. 180 tablet 2  . Coenzyme Q10 (CO Q-10) 200 MG CAPS Take 1 capsule by mouth daily.    . fexofenadine (ALLEGRA) 180 MG tablet Take 180 mg by mouth daily.    . furosemide (LASIX) 40 MG tablet Take 1 tablet (40 mg total) by mouth daily. 90 tablet 3  . gabapentin (NEURONTIN) 100 MG capsule Take 500 mg by mouth at bedtime. Patient takes 5-6 tablets by mouth at bedtime    .  LevOCARNitine (CARNITINE PO) Take 500 mg by mouth daily.    . Magnesium 400 MG CAPS Take 1 capsule by mouth daily.     . meloxicam (MOBIC) 15 MG tablet Take 15 mg by mouth daily.    . Multiple Vitamin (MULTIVITAMIN) tablet Take 1 tablet by mouth daily.    . nitroGLYCERIN (NITROSTAT) 0.4 MG SL tablet Place 0.4 mg under the tongue every 5 (five) minutes as needed for chest pain.    . NON FORMULARY (Ribose) Take 1 capsule once a day    . NON FORMULARY Take 1 tablet by mouth daily. Glycine viatmin 1 tab po qd    . NON FORMULARY 465 mg daily. [place]    . Omega-3 Fatty Acids (OMEGA 3 PO) Take  2 capsules by mouth daily.     . potassium chloride SA (K-DUR,KLOR-CON) 20 MEQ tablet TAKE 1 TABLET (20 MEQ TOTAL) BY MOUTH DAILY. 90 tablet 2  . sacubitril-valsartan (ENTRESTO) 24-26 MG Take 1 tablet by mouth 2 (two) times daily. 180 tablet 2  . vitamin C (ASCORBIC ACID) 500 MG tablet Take 500 mg by mouth daily.    Alveda Reasons 20 MG TABS tablet TAKE 1 TABLET (20 MG TOTAL) BY MOUTH DAILY WITH SUPPER. 90 tablet 2   No current facility-administered medications for this visit.     Allergies:    Allergies  Allergen Reactions  . Lisinopril Cough    Social History:  The patient  reports that he has quit smoking. His smoking use included Cigars. He has never used smokeless tobacco. He reports that he does not drink alcohol or use drugs.   Family History  Problem Relation Age of Onset  . Other Father     Car accident  . Coronary artery disease Neg Hx     ROS:  Please see the history of present illness.   No chest pain, no syncope. Mild dizziness. Recent head cold.   All other systems reviewed and negative.   PHYSICAL EXAM: VS:  BP 114/68   Pulse (!) 102   Ht 5' 7.5" (1.715 m)   Wt 272 lb (123.4 kg)   BMI 41.97 kg/m  Well nourished, well developed, in no acute distress HEENT: normal, [place]/AT, EOMI Neck: no JVD, normal carotid upstroke, no bruit Cardiac:  normal S1, S2;  Irregularly irregular, normal rate; no murmur Lungs:  clear to auscultation bilaterally, no wheezing, rhonchi or rales Abd: soft, nontender, no hepatomegaly, no bruitsobese Ext: no edema, 2+ distal pulses Skin: warm and dry GU: deferred Neuro: no focal abnormalities noted, AAO x 3  EKG: EKG ordered today 07/10/15 shows atrial fibrillation heart rate 94 with PVC, nonspecific ST-T wave changes personally viewed-prior 05/27/14-atrial fibrillation heart rate 88.  ASSESSMENT AND PLAN:  1. Persistent atrial fibrillation- Dr. Tanna Furry note reviewed. We will continue with rate control strategy. Continue with carvedilol. Rate  is under control. 2. Cardiomyopathy-both tachycardia as well as ischemic-continue to encourage carvedilol 12.5 twice a day.   Doing better with Entresto. Multifactorial from obesity, cardiomyopathy, deconditioning. I'm encouraged however that his heart rate is better controlled to allow more efficiency. 3. Chronic systolic heart failure-ejection fraction 25%.   Entresto 24/26 BID. Previously on losartan 50 QD. We stopped digoxin.  4. Chronic anticoagulation-as above. No bleeding. Xarelto. Checking CBC and basic metabolic profile 5. Dyspnea-we will check PFTs. Long history of smoking. Quit 10 years ago. 6. Leg pain-we will check lower extremity arterial Dopplers. Went to diagnose any evidence of peripheral vascular disease.  7. Coronary artery disease-prior cardiac catheterization reviewed. 8. Obesity-continue to encourage weight loss. 9. We will see him back in 6 months  Signed, [name], MD [facility]  12/24/2015 3:51 PM

## 2015-12-24 NOTE — Patient Instructions (Signed)
Medication Instructions:  Your physician recommends that you continue on your current medications as directed. Please refer to the Current Medication list given to you today.   Labwork: TODAY (BMET, CBC)  Testing/Procedures: Your physician has recommended that you have a pulmonary function test. Pulmonary Function Tests are a group of tests that measure how well air moves in and out of your lungs.  Your physician has requested that you have a lower extremity arterial duplex. During this test, ultrasound is used to evaluate arterial blood flow in the legs. Allow one hour for this exam. There are no restrictions or special instructions.   Follow-Up: Your physician wants you to follow-up in: 6 months with Dr. Marlou Porch.  You will receive a reminder letter in the mail two months in advance. If you don't receive a letter, please call our office to schedule the follow-up appointment.   Any Other Special Instructions Will Be Listed Below (If Applicable).     If you need a refill on your cardiac medications before your next appointment, please call your pharmacy.

## 2015-12-25 ENCOUNTER — Telehealth: Payer: Self-pay | Admitting: Cardiology

## 2015-12-25 NOTE — Telephone Encounter (Signed)
New message  Pt is returning call to [facility]  Please call back

## 2015-12-25 NOTE — Telephone Encounter (Signed)
PT AWARE OF LAB RESULTS./CY 

## 2015-12-29 ENCOUNTER — Telehealth: Payer: Self-pay | Admitting: Cardiology

## 2015-12-29 NOTE — Telephone Encounter (Signed)
Norman Rodriguez is calling to find out what over the counter medication he should take for a cold . Please call

## 2015-12-29 NOTE — Telephone Encounter (Signed)
Pt advised to avoid decongestants, can use antihistamine, can use saline nasal spray.

## 2016-01-15 ENCOUNTER — Other Ambulatory Visit: Payer: Self-pay | Admitting: Cardiology

## 2016-01-15 DIAGNOSIS — M79605 Pain in left leg: Principal | ICD-10-CM

## 2016-01-15 DIAGNOSIS — M79604 Pain in right leg: Secondary | ICD-10-CM

## 2016-02-02 ENCOUNTER — Inpatient Hospital Stay (HOSPITAL_COMMUNITY): Admission: RE | Admit: 2016-02-02 | Payer: Medicare Other | Source: Ambulatory Visit

## 2016-02-20 ENCOUNTER — Telehealth: Payer: Self-pay | Admitting: Cardiology

## 2016-02-20 NOTE — Telephone Encounter (Signed)
Norman Rodriguez is calling because he has a question about the LE test he is scheduled for on 02-24-16. He has some concerns because he is also supposed to have a pulmonary function test done but he would like to know if he should have the LE before the Pulmonary or if it should be the other way around. Please call, thanks.

## 2016-02-20 NOTE — Telephone Encounter (Signed)
Patient called saying he did not think he can do his LE test on Tuesday, because he is having trouble ambulating. Patient thinks he might be able to come in a couple of weeks. Patient stated he was going to call again on Monday. Informed patient that message would be sent to vascular study scheduler.

## 2016-02-24 ENCOUNTER — Encounter (HOSPITAL_COMMUNITY): Payer: Medicare Other

## 2016-03-17 ENCOUNTER — Ambulatory Visit (INDEPENDENT_AMBULATORY_CARE_PROVIDER_SITE_OTHER): Payer: Medicare Other | Admitting: Internal Medicine

## 2016-03-17 DIAGNOSIS — R0602 Shortness of breath: Secondary | ICD-10-CM

## 2016-03-17 LAB — PULMONARY FUNCTION TEST
DL/VA % PRED: 109 %
DL/VA: 4.91 ml/min/mmHg/L
DLCO cor % pred: 61 %
DLCO cor: 18.2 ml/min/mmHg
DLCO unc % pred: 64 %
DLCO unc: 19.19 ml/min/mmHg
FEF 25-75 Post: 1.88 L/sec
FEF 25-75 Pre: 1.54 L/sec
FEF2575-%CHANGE-POST: 21 %
FEF2575-%Pred-Post: 71 %
FEF2575-%Pred-Pre: 58 %
FEV1-%Change-Post: 1 %
FEV1-%PRED-PRE: 51 %
FEV1-%Pred-Post: 52 %
FEV1-PRE: 1.68 L
FEV1-Post: 1.71 L
FEV1FVC-%CHANGE-POST: 5 %
FEV1FVC-%Pred-Pre: 106 %
FEV6-%CHANGE-POST: -3 %
FEV6-%PRED-PRE: 51 %
FEV6-%Pred-Post: 49 %
FEV6-POST: 2.03 L
FEV6-PRE: 2.1 L
FEV6FVC-%Change-Post: 0 %
FEV6FVC-%PRED-POST: 105 %
FEV6FVC-%PRED-PRE: 105 %
FVC-%CHANGE-POST: -3 %
FVC-%PRED-POST: 46 %
FVC-%PRED-PRE: 48 %
FVC-POST: 2.04 L
FVC-PRE: 2.1 L
POST FEV6/FVC RATIO: 100 %
Post FEV1/FVC ratio: 84 %
Pre FEV1/FVC ratio: 80 %
Pre FEV6/FVC Ratio: 100 %
RV % PRED: 81 %
RV: 1.78 L
TLC % PRED: 60 %
TLC: 3.96 L

## 2016-03-23 ENCOUNTER — Ambulatory Visit (HOSPITAL_COMMUNITY)
Admission: RE | Admit: 2016-03-23 | Discharge: 2016-03-23 | Disposition: A | Discharge status: Home / Self Care | Payer: Medicare Other | Source: Ambulatory Visit | Attending: Cardiovascular Disease | Admitting: Cardiovascular Disease

## 2016-03-23 DIAGNOSIS — M79605 Pain in left leg: Secondary | ICD-10-CM | POA: Diagnosis present

## 2016-03-23 DIAGNOSIS — M79604 Pain in right leg: Secondary | ICD-10-CM | POA: Diagnosis present

## 2016-03-23 DIAGNOSIS — R0989 Other specified symptoms and signs involving the circulatory and respiratory systems: Secondary | ICD-10-CM

## 2016-03-29 ENCOUNTER — Telehealth: Payer: Self-pay | Admitting: Cardiology

## 2016-03-29 DIAGNOSIS — R942 Abnormal results of pulmonary function studies: Secondary | ICD-10-CM

## 2016-03-29 NOTE — Telephone Encounter (Signed)
New message     Returning your call about the pulmonary test results

## 2016-03-29 NOTE — Telephone Encounter (Signed)
Notes Recorded by Jerline Pain, MD on 03/22/2016 at 1:23 PM EST PFTs are abnormal, FEV1 51%. Please refer in consultation to pulmonary for evaluation of abnormal PFTs, shortness of breath. Norman Furbish, MD   Called patient with results of his PFT and put in order for referral. Patient verbalized understanding.

## 2016-04-14 ENCOUNTER — Encounter: Payer: Self-pay | Admitting: Pulmonary Disease

## 2016-04-14 ENCOUNTER — Ambulatory Visit (INDEPENDENT_AMBULATORY_CARE_PROVIDER_SITE_OTHER): Payer: Medicare Other | Admitting: Pulmonary Disease

## 2016-04-14 DIAGNOSIS — R0689 Other abnormalities of breathing: Secondary | ICD-10-CM

## 2016-04-14 DIAGNOSIS — R0600 Dyspnea, unspecified: Secondary | ICD-10-CM | POA: Insufficient documentation

## 2016-04-14 MED ORDER — UMECLIDINIUM-VILANTEROL 62.5-25 MCG/INH IN AEPB: 1.0000 | INHALATION_SPRAY | Freq: Every day | RESPIRATORY_TRACT | 0 refills | Status: DC
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
## 2016-04-14 NOTE — Assessment & Plan Note (Signed)
PFTs show a pattern of Extraparenchymal restriction which is more likely related to obesity and possibly related to his ventral hernia and history of diaphragmatic hernia repair as an infant- which sometimes causes lung function to never reach its peak.  As such I doubt that he has COPD, he claims that his smoking history is not substantial but does report a 20-30-pack-year history of smoking.  We can try a therapeutic trial of LABA/LAMA combination to see if this will improve his dyspnea symptomatically-if it does we can consider long-term therapy. I would be concerned about effects of LABA causing tachycardia and would monitor him for that.  He was given a sample of ANORO- he will call us back if he is symptomatically improved and we will renew this prescription

## 2016-04-14 NOTE — Patient Instructions (Signed)
You have restriction on your lung function test which may be related to your weight or history of diaphragmatic hernia repair as a child  Trial of ANORO once daily -call me back for prescription in 10 days if this helps your breathing

## 2016-04-19 ENCOUNTER — Telehealth: Payer: Self-pay | Admitting: Cardiology

## 2016-04-19 NOTE — Telephone Encounter (Signed)
Mr. Tsutsui is calling to find out how long the batteries are good for in a defibrillator . He was told by [employer]  4-6 yrs  , but another MD told him they have changed that and its 10-12 yrs . Please call .[name] Thanks

## 2016-04-19 NOTE — Telephone Encounter (Signed)
Called and lm for pt the length of time a battery lasts in a defib would depend on multiple factors and therefore is the reason they are checked frequently.  Requested he call back if further questions.

## 2016-04-21 ENCOUNTER — Telehealth: Payer: Self-pay | Admitting: Pulmonary Disease

## 2016-04-21 MED ORDER — UMECLIDINIUM-VILANTEROL 62.5-25 MCG/INH IN AEPB: 1.0000 | INHALATION_SPRAY | Freq: Every day | RESPIRATORY_TRACT | 5 refills | Status: DC

## 2016-04-21 NOTE — Telephone Encounter (Signed)
Called spoke with patient who reports the Anoro seems to helped with the pressure and congestion in his chest.  Rx sent to pharmacy. Nothing further needed, will sign off.  Patient Instructions  You have restriction on your lung function test which may be related to your weight or history of diaphragmatic hernia repair as a child   Trial of ANORO once daily -call me back for prescription in 10 days if this helps your breathing   Patient Instructions  You have restriction on your lung function test which may be related to your weight or history of diaphragmatic hernia repair as a child   Trial of ANORO once daily -call me back for prescription in 10 days if this helps your breathing

## 2016-04-22 ENCOUNTER — Ambulatory Visit: Payer: Medicare Other | Admitting: Nurse Practitioner

## 2016-06-02 ENCOUNTER — Encounter: Payer: Self-pay | Admitting: Cardiology

## 2016-06-02 ENCOUNTER — Ambulatory Visit (INDEPENDENT_AMBULATORY_CARE_PROVIDER_SITE_OTHER): Payer: Medicare Other | Admitting: Cardiology

## 2016-06-02 VITALS — BP 112/70 | HR 107 | Ht 68.0 in | Wt 273.2 lb

## 2016-06-02 DIAGNOSIS — M79605 Pain in left leg: Secondary | ICD-10-CM

## 2016-06-02 DIAGNOSIS — R942 Abnormal results of pulmonary function studies: Secondary | ICD-10-CM | POA: Diagnosis not present

## 2016-06-02 DIAGNOSIS — M79604 Pain in right leg: Secondary | ICD-10-CM | POA: Diagnosis not present

## 2016-06-02 DIAGNOSIS — I5022 Chronic systolic (congestive) heart failure: Secondary | ICD-10-CM | POA: Diagnosis not present

## 2016-06-02 DIAGNOSIS — I480 Paroxysmal atrial fibrillation: Secondary | ICD-10-CM

## 2016-06-02 DIAGNOSIS — I482 Chronic atrial fibrillation: Secondary | ICD-10-CM | POA: Diagnosis not present

## 2016-06-02 DIAGNOSIS — I4821 Permanent atrial fibrillation: Secondary | ICD-10-CM

## 2016-06-02 NOTE — Progress Notes (Signed)
[facility] [place]. [address] [place], [place]  [address] Phone: [phone] Fax:  [phone]  Date:  06/02/2016   ID:  Norman Rodriguez, DOB 08-Mar-1955, MRN [id number]  PCP:  [facility], [facility]   History of Present Illness: Norman Rodriguez is a 61 y.o. 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   He failed Tikosyn, amiodarone , multiple cardioversions. We are now in rate control strategy.  Complaints have been fairly consistent over the last several visits, shortness of breath with activity.   He also now complains of posterior calf pain and redness. He showed me. Checking Dopplers. Prior arterial Dopplers were reassuring.  Wt Readings from Last 3 Encounters:  06/02/16 273 lb 3.2 oz (123.9 kg)  04/14/16 273 lb 9.6 oz (124.1 kg)  12/24/15 272 lb (123.4 kg)     Past Medical History:  Diagnosis Date  . Arthritis   . Atrial fibrillation ([place])    a. Dx 2010. b. Initiated on Tikosyn 03/2013.  [name] CAD (coronary artery disease)    a. BMS 2.28mm 23 to LAD. CTO of RCA unsuccessful, good left to right collaterals - 2010.  [name] Chronic anticoagulation   . Chronic systolic CHF (congestive heart failure) ([place])    a. Both ischemic and tachy induced. b. Echo 04/2013: EF 30-35%, no RWMA, normal RV, mod dilated LA (before restoration of NSR).  [name] Dysrhythmia    hx of atrial fibrilation  . HTN (hypertension)   . Hyperlipidemia   . NSVT (nonsustained ventricular tachycardia) ([place])   . Obesity   . Shortness of breath     Past Surgical History:  Procedure Laterality Date  . CARDIOVERSION N/A 04/27/2013   Procedure: BEDSIDE CARDIOVERSION;  Surgeon: Sanda Klein, MD;  Location: [facility];  Service: Cardiovascular;  Laterality: N/A;  . CARDIOVERSION N/A 05/15/2013   Procedure: CARDIOVERSION/BEDSIDE;  Surgeon: Larey Dresser, MD;  Location: [facility];  Service: Cardiovascular;  Laterality: N/A;  . CARDIOVERSION N/A 05/16/2014   Procedure: CARDIOVERSION;  Surgeon: Jerline Pain, MD;  Location: [facility];  Service: Cardiovascular;  Laterality: N/A;  . CARDIOVERSION N/A 06/07/2014   Procedure: CARDIOVERSION;  Surgeon: Jerline Pain, MD;  Location: [facility];  Service: Cardiovascular;  Laterality: N/A;  . CORONARY ANGIOPLASTY WITH STENT PLACEMENT  2010   BMS 2.65mm 23 to LAD. CTO of RCA unsuccessful, good left to right collaterals  . FOOT SURGERY Right     Current Outpatient Prescriptions  Medication Sig Dispense Refill  . atorvastatin (LIPITOR) 40 MG tablet TAKE 1 TABLET (40 MG TOTAL) BY MOUTH DAILY. 90 tablet 3  . B Complex-C (B-COMPLEX WITH VITAMIN C) tablet Take 1 tablet by mouth daily.    . carvedilol (COREG) 12.5 MG tablet TAKE 1 TABLET (12.5 MG TOTAL) BY MOUTH TWO TIMES DAILY WITH A MEAL. 180 tablet 2  . Coenzyme Q10 (CO Q-10) 200 MG CAPS Take 1 capsule by mouth daily.    . fexofenadine (ALLEGRA) 180 MG tablet Take 180 mg by mouth daily.    . furosemide (LASIX) 40 MG tablet Take 1 tablet (40 mg total) by mouth daily. 90 tablet 3  . gabapentin (NEURONTIN) 100 MG capsule Take 500-600 mg by mouth at bedtime. Patient takes 5-6 tablets by mouth at bedtime    . LevOCARNitine (CARNITINE PO) Take 500 mg by mouth daily.    . Magnesium 400 MG  CAPS Take 1 capsule by mouth daily.     . meloxicam (MOBIC) 15 MG tablet Take 15 mg by mouth daily.    . Multiple Vitamin (MULTIVITAMIN) tablet Take 1 tablet by mouth daily.    . nitroGLYCERIN (NITROSTAT) 0.4 MG SL tablet Place 0.4 mg under the tongue every 5 (five) minutes as needed for chest pain.    . NON FORMULARY Take 1 capsule by mouth daily. (Ribose) Take 1 capsule once a day     . NON FORMULARY Take 1 tablet by mouth daily. Glycine viatmin 1 tab po qd    . NON FORMULARY Take 465 mg by mouth daily. [place]     . Omega-3 Fatty Acids (OMEGA 3 PO) Take 2 capsules by mouth daily.      . potassium chloride SA (K-DUR,KLOR-CON) 20 MEQ tablet TAKE 1 TABLET (20 MEQ TOTAL) BY MOUTH DAILY. 90 tablet 2  . sacubitril-valsartan (ENTRESTO) 24-26 MG Take 1 tablet by mouth 2 (two) times daily. 180 tablet 2  . umeclidinium-vilanterol (ANORO ELLIPTA) 62.5-25 MCG/INH AEPB Inhale 1 puff into the lungs daily. 60 each 5  . vitamin C (ASCORBIC ACID) 500 MG tablet Take 500 mg by mouth daily.    Alveda Reasons 20 MG TABS tablet TAKE 1 TABLET (20 MG TOTAL) BY MOUTH DAILY WITH SUPPER. 90 tablet 2   No current facility-administered medications for this visit.     Allergies:    Allergies  Allergen Reactions  . Apple Hives  . Soybean Oil Other (See Comments)    unknown  . Lisinopril Cough    Social History:  The patient  reports that he has quit smoking. His smoking use included Cigars. He has never used smokeless tobacco. He reports that he does not drink alcohol or use drugs.   Family History  Problem Relation Age of Onset  . Other Father     Car accident  . Coronary artery disease Neg Hx     ROS:  Please see the history of present illness.   No chest pain, no syncope. Mild dizziness. Positive for bilateral calf pain. Shortness of breath. No other complaints.  PHYSICAL EXAM: VS:  BP 112/70   Pulse (!) 107   Ht 5\' 8"  (1.727 m)   Wt 273 lb 3.2 oz (123.9 kg)   BMI 41.54 kg/m  GEN: Well nourished, well developed, in no acute distress  HEENT: normal  Neck: no JVD, carotid bruits, or masses Cardiac: irreg irreg; no murmurs, rubs, or gallops,no edema  Respiratory:  clear to auscultation bilaterally, normal work of breathing GI: soft, nontender, nondistended, + BS, obese MS: no deformity or atrophy  Skin: warm and dry, no rash, Bilateral calf tenderness, mild erythema, trace edema Neuro:  Alert and Oriented x 3, Strength and sensation are intact Psych: euthymic mood, full affect   EKG: EKG ordered today 06/02/16 shows atrial fibrillation heart rate 107, nonspecific T-wave changes.  Personally viewed 07/10/15 shows atrial fibrillation heart rate 94 with PVC, nonspecific ST-T wave changes personally viewed-prior 05/27/14-atrial fibrillation heart rate 88.  ASSESSMENT AND PLAN:  1. Persistent atrial fibrillation- Dr. Tanna Furry note reviewed. We will continue with rate control strategy. Continue with carvedilol. Rate is under control. Heart rate less than 120. 2. Cardiomyopathy-both tachycardia as well as ischemic-continue to encourage carvedilol 12.5 twice a day.   Doing better with Entresto. Multifactorial from obesity, cardiomyopathy, deconditioning. I'm encouraged however that his heart rate is better controlled to allow more efficiency. Stable. 3. Chronic systolic heart  failure-ejection fraction 25%.   Entresto 24/26 BID. Previously on losartan 50 QD. We previously stopped digoxin. Stable  4. Chronic anticoagulation-as above. No bleeding. Xarelto.  CBC and basic metabolic profile no changes. 5. Dyspnea-appreciate pulmonary consultation. Likely restrictive lung disease from diaphragmatic hernia repair as a child, obesity. He is taking inhaler. Long history of smoking. Quit 10 years ago. 6. Leg pain-arterial Dopplers were normal previously. He does have redness and pain bilateral calf posteriorly, tender to touch. We will go ahead and check lower extremity venous Dopplers. I think would be exceedingly rare to have development of deep vein thrombosis especially given his anticoagulation use. 7. Coronary artery disease-prior cardiac catheterization reviewed. 8. Obesity-continue to encourage weight loss. No changes. 9. We will see him back in 6 months, noted he has been frustrated with having to go to [place] for certain testing and doctor's visits. Could consider resuming PCP here.   Signed, [name], MD [facility]  06/02/2016 2:39 PM

## 2016-06-02 NOTE — Patient Instructions (Signed)
Medication Instructions:  The current medical regimen is effective;  continue present plan and medications.  Testing/Procedures: Your physician has requested that you have a lower extremity venous duplex. This test is an ultrasound of the veins in the legs. It looks at venous blood flow that carries blood from the heart to the legs. Allow one hour for a Lower Venous exam. There are no restrictions or special instructions.  Follow-Up: Follow up in 6 months with Dr. Marlou Porch.  You will receive a letter in the mail 2 months before you are due.  Please call us when you receive this letter to schedule your follow up appointment.  If you need a refill on your cardiac medications before your next appointment, please call your pharmacy.  [facility]  Thank you for choosing [facility]!!

## 2016-06-03 ENCOUNTER — Telehealth: Payer: Self-pay | Admitting: Cardiology

## 2016-06-03 NOTE — Telephone Encounter (Signed)
New message    Patient calling want to thank Dr. Marlou Porch nurse for yesterday . Thanks for keeping him in the [facility].    Patient states he does not need a call back from nurse.

## 2016-06-03 NOTE — Telephone Encounter (Signed)
thank you !!

## 2016-06-17 ENCOUNTER — Ambulatory Visit: Payer: Self-pay | Admitting: Emergency Medicine

## 2016-06-24 ENCOUNTER — Ambulatory Visit (HOSPITAL_COMMUNITY)
Admission: RE | Admit: 2016-06-24 | Discharge: 2016-06-24 | Disposition: A | Discharge status: Home / Self Care | Payer: Medicare Other | Source: Ambulatory Visit | Attending: Cardiovascular Disease | Admitting: Cardiovascular Disease

## 2016-06-24 DIAGNOSIS — Z87891 Personal history of nicotine dependence: Secondary | ICD-10-CM | POA: Diagnosis not present

## 2016-06-24 DIAGNOSIS — M79605 Pain in left leg: Secondary | ICD-10-CM | POA: Insufficient documentation

## 2016-06-24 DIAGNOSIS — E785 Hyperlipidemia, unspecified: Secondary | ICD-10-CM | POA: Insufficient documentation

## 2016-06-24 DIAGNOSIS — I10 Essential (primary) hypertension: Secondary | ICD-10-CM | POA: Insufficient documentation

## 2016-06-24 DIAGNOSIS — I2510 Atherosclerotic heart disease of native coronary artery without angina pectoris: Secondary | ICD-10-CM | POA: Diagnosis not present

## 2016-06-24 DIAGNOSIS — M79604 Pain in right leg: Secondary | ICD-10-CM

## 2016-07-05 ENCOUNTER — Telehealth: Payer: Self-pay | Admitting: Cardiology

## 2016-07-05 NOTE — Telephone Encounter (Signed)
New message    Pt is calling asking for a call back. He has questions about the doppler he had and the redness in his legs.

## 2016-07-05 NOTE — Telephone Encounter (Addendum)
PT CALLING  STILL HAS CONCERNS  RE  REDNESS  TO LEGS  WANTING TO KNOW IF  ANYTHING  CAN BE DONE  TO HELP  ALSO  CALLED IS NOT  HAPPY WITH   [facility]  [address] SOME  OF THE  STAFF NOT  VERY PROFESSIONAL OR  FRIENDLY   SO  WILL  SEE DR Ebony Hail IRWIN THRU [facility]  WANTED  TO INFORM  us  OF CHANGE  WILL FORWARD TO  DR  Marlou Porch  FOR  REVIEW .Adonis Housekeeper

## 2016-07-08 NOTE — Telephone Encounter (Signed)
Continue to take diuretics. Sometimes, edema can cause redness. Elevate legs when able.  [name], MD

## 2016-07-09 NOTE — Telephone Encounter (Signed)
Pt is aware of recommendations.  He is complaintive of the suggestions and states he can't keep his legs elevated.  He also doesn't want to wear knee high support hose.

## 2016-07-30 ENCOUNTER — Other Ambulatory Visit: Payer: Self-pay | Admitting: Cardiology

## 2016-09-06 ENCOUNTER — Other Ambulatory Visit: Payer: Self-pay | Admitting: Cardiology

## 2016-10-08 ENCOUNTER — Telehealth: Payer: Self-pay | Admitting: Cardiology

## 2016-10-08 NOTE — Telephone Encounter (Signed)
New message    Pt is calling to find out what he can take for a headache with his heart medication. Please call.

## 2016-10-08 NOTE — Telephone Encounter (Signed)
I spoke with pt and told him it would be OK to take acetaminophen.  I told him to follow label instructions

## 2016-11-12 ENCOUNTER — Other Ambulatory Visit: Payer: Self-pay | Admitting: Pulmonary Disease

## 2016-12-03 ENCOUNTER — Other Ambulatory Visit: Payer: Self-pay | Admitting: Cardiology

## 2016-12-06 NOTE — Telephone Encounter (Signed)
Xarelto 20mg  refill received; pt is 61 yrs old, Crea-0.95 on 12/24/15, Wt-123.9kg, last seen by Dr. Marlou Porch on 06/02/16, CrCl-139.37ml/min. Will send in a refill as pt labs expire on 12/23/16 & pt has an appt with Cecille Rubin, NP on 12/08/16 at 3pm & placed a note on pt's appt to have labs drawn on 12/08/16.

## 2016-12-08 ENCOUNTER — Ambulatory Visit: Payer: Medicare Other | Admitting: Nurse Practitioner

## 2017-02-21 ENCOUNTER — Ambulatory Visit (INDEPENDENT_AMBULATORY_CARE_PROVIDER_SITE_OTHER): Payer: Medicare Other | Admitting: Orthopaedic Surgery

## 2017-02-21 ENCOUNTER — Encounter (INDEPENDENT_AMBULATORY_CARE_PROVIDER_SITE_OTHER): Payer: Self-pay | Admitting: Orthopaedic Surgery

## 2017-02-21 DIAGNOSIS — G5622 Lesion of ulnar nerve, left upper limb: Secondary | ICD-10-CM | POA: Diagnosis not present

## 2017-02-21 DIAGNOSIS — M25532 Pain in left wrist: Secondary | ICD-10-CM
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
## 2017-02-28 ENCOUNTER — Encounter: Payer: Self-pay | Admitting: Nurse Practitioner

## 2017-02-28 ENCOUNTER — Ambulatory Visit (INDEPENDENT_AMBULATORY_CARE_PROVIDER_SITE_OTHER): Payer: Medicare Other | Admitting: Nurse Practitioner

## 2017-02-28 VITALS — BP 140/80 | HR 103 | Ht 68.0 in | Wt 276.0 lb

## 2017-02-28 DIAGNOSIS — I481 Persistent atrial fibrillation: Secondary | ICD-10-CM | POA: Diagnosis not present

## 2017-02-28 DIAGNOSIS — R0602 Shortness of breath: Secondary | ICD-10-CM

## 2017-02-28 DIAGNOSIS — I5022 Chronic systolic (congestive) heart failure: Secondary | ICD-10-CM

## 2017-02-28 DIAGNOSIS — I4819 Other persistent atrial fibrillation: Secondary | ICD-10-CM

## 2017-02-28 NOTE — Patient Instructions (Addendum)
We will be checking the following labs today - BMET, CBC, HPF, Lipids and BNP   Medication Instructions:    Continue with your current medicines.     Testing/Procedures To Be Arranged:  Echocardiogram  Follow-Up:   See Dr. Marlou Porch for discussion - hoping to increase your medicines for your heart failure on return visit    Other Special Instructions:   N/A    If you need a refill on your cardiac medications before your next appointment, please call your pharmacy.   Call the [place] office at [phone] if you have any questions, problems or concerns.

## 2017-02-28 NOTE — Progress Notes (Addendum)
CARDIOLOGY OFFICE NOTE  Date:  02/28/2017    Norman Rodriguez Date of Birth: 05/16/1955 Medical Record #[id number]  PCP:  [facility]  Cardiologist:  [facility]   Chief Complaint  Patient presents with  . Atrial Fibrillation    Follow up visit - seen for Dr. Marlou Porch    History of Present Illness: Norman Rodriguez is a 62 y.o. male who presents today for a follow up visit. Seen for Dr. Marlou Porch.   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   He failed Tikosyn, amiodarone, multiple cardioversions. We are now in rate control strategy with chronic anticoagulation.   Last seen back in May by Dr. Marlou Porch. Noted that his complaints have been fairly consistent over the last several visits, shortness of breath with activity.   Comes in today. Here alone. His history is a little hard to follow. Some rambling. He notes more shortness of breath with just minimal activities. No chest pain that I discern. Some palpitations. Has had some bloody nasal drainage a few days ago - this has now stopped. No recent labs since 2017. Remains on Xarelto.  Tells me he was planning on having carpal tunnel surgery and needs cataracts but has issues with the transportation and then decided he needed to "just come here".   Past Medical History:  Diagnosis Date  . Arthritis   . Atrial fibrillation ([place])    a. Dx 2010. b. Initiated on Tikosyn 03/2013.  [name] CAD (coronary artery disease)    a. BMS 2.45mm 23 to LAD. CTO of RCA unsuccessful, good left to right collaterals - 2010.  [name] Chronic anticoagulation   . Chronic systolic CHF (congestive heart failure) ([place])    a. Both ischemic and tachy induced. b. Echo 04/2013: EF 30-35%, no RWMA, normal RV, mod dilated LA (before restoration of NSR).  [name] Dysrhythmia    hx of atrial fibrilation  . HTN  (hypertension)   . Hyperlipidemia   . NSVT (nonsustained ventricular tachycardia) ([place])   . Obesity   . Shortness of breath     Past Surgical History:  Procedure Laterality Date  . CARDIOVERSION N/A 04/27/2013   Procedure: BEDSIDE CARDIOVERSION;  Surgeon: Sanda Klein, MD;  Location: [place];  Service: Cardiovascular;  Laterality: N/A;  . CARDIOVERSION N/A 05/15/2013   Procedure: CARDIOVERSION/BEDSIDE;  Surgeon: Larey Dresser, MD;  Location: [place];  Service: Cardiovascular;  Laterality: N/A;  . CARDIOVERSION N/A 05/16/2014   Procedure: CARDIOVERSION;  Surgeon: Jerline Pain, MD;  Location: [place];  Service: Cardiovascular;  Laterality: N/A;  . CARDIOVERSION N/A 06/07/2014   Procedure: CARDIOVERSION;  Surgeon: Jerline Pain, MD;  Location: [facility];  Service: Cardiovascular;  Laterality: N/A;  . CORONARY ANGIOPLASTY WITH STENT PLACEMENT  2010   BMS 2.61mm 23 to LAD. CTO of RCA unsuccessful, good left to right collaterals  . FOOT SURGERY Right      Medications: Current Meds  Medication Sig  . ANORO ELLIPTA 62.5-25 MCG/INH AEPB INHALE 1 PUFF INTO THE LUNGS DAILY.  [name] atorvastatin (LIPITOR) 40 MG tablet TAKE 1 TABLET (40 MG TOTAL) BY MOUTH DAILY.  . B Complex-C (B-COMPLEX WITH VITAMIN C) tablet Take 1 tablet by mouth daily.  . carvedilol (COREG) 12.5 MG tablet TAKE 1 TABLET (12.5 MG TOTAL) BY MOUTH TWO TIMES DAILY WITH A MEAL.  [name] Coenzyme Q10 (CO Q-10) 200 MG  CAPS Take 1 capsule by mouth daily.  [name] ENTRESTO 24-26 MG TAKE 1 TABLET BY MOUTH TWO TIMES DAILY.  . fexofenadine (ALLEGRA) 180 MG tablet Take 180 mg by mouth daily.  . furosemide (LASIX) 40 MG tablet TAKE 1 TABLET (40 MG TOTAL) BY MOUTH DAILY.  [name] gabapentin (NEURONTIN) 100 MG capsule Take 500-600 mg by mouth at bedtime. Patient takes 5-6 tablets by mouth at bedtime  . LevOCARNitine (CARNITINE PO) Take 500 mg by mouth daily.  . Magnesium 400 MG CAPS Take 1 capsule by mouth daily.   . meloxicam (MOBIC) 15 MG tablet Take 15 mg  by mouth daily.  . Multiple Vitamin (MULTIVITAMIN) tablet Take 1 tablet by mouth daily.  . nitroGLYCERIN (NITROSTAT) 0.4 MG SL tablet Place 0.4 mg under the tongue every 5 (five) minutes as needed for chest pain.  . NON FORMULARY Take 1 capsule by mouth daily. (Ribose) Take 1 capsule once a day   . NON FORMULARY Take 1 tablet by mouth daily. Glycine viatmin 1 tab po qd  . NON FORMULARY Take 465 mg by mouth daily. [place]   . Omega-3 Fatty Acids (OMEGA 3 PO) Take 2 capsules by mouth daily.   . potassium chloride SA (K-DUR,KLOR-CON) 20 MEQ tablet TAKE 1 TABLET (20 MEQ TOTAL) BY MOUTH DAILY.  . vitamin C (ASCORBIC ACID) 500 MG tablet Take 500 mg by mouth daily.  Alveda Reasons 20 MG TABS tablet TAKE 1 TABLET (20 MG TOTAL) BY MOUTH DAILY WITH SUPPER.  . [DISCONTINUED] furosemide (LASIX) 40 MG tablet Take 1 tablet (40 mg total) by mouth daily.     Allergies: Allergies  Allergen Reactions  . Apple Hives  . Soybean Oil Other (See Comments)    unknown  . Lisinopril Cough    Social History: The patient  reports that he has quit smoking. His smoking use included cigars. he has never used smokeless tobacco. He reports that he does not drink alcohol or use drugs.   Family History: The patient's family history includes Other in his father.   Review of Systems: Please see the history of present illness.   Otherwise, the review of systems is positive for none.   All other systems are reviewed and negative.   Physical Exam: VS:  BP 140/80 (BP Location: Left Arm, Patient Position: Sitting, Cuff Size: Large)   Pulse (!) 103   Ht 5\' 8"  (1.727 m)   Wt 276 lb (125.2 kg)   BMI 41.97 kg/m  .  BMI Body mass index is 41.97 kg/m.  Wt Readings from Last 3 Encounters:  02/28/17 276 lb (125.2 kg)  06/02/16 273 lb 3.2 oz (123.9 kg)  04/14/16 273 lb 9.6 oz (124.1 kg)    General:  Morbidly obese. Alert and in no acute distress.   HEENT: Normal.  Neck: Supple, no JVD, carotid bruits, or  masses noted.  Cardiac: Irregular irregular rhythm. Rate is a little fast.  No murmurs, rubs, or gallops. Trace edema.  Respiratory:  Lungs are clear to auscultation bilaterally with normal work of breathing.  GI: Soft and nontender.  MS: No deformity or atrophy. Gait and ROM intact.  Skin: Warm and dry. Color is normal.  Neuro:  Strength and sensation are intact and no gross focal deficits noted.  Psych: Alert, appropriate and with normal affect.   LABORATORY DATA:  EKG:  EKG is ordered today. This demonstrates AF with controlled VR.  Lab Results  Component Value Date   WBC 8.2 12/24/2015  HGB 17.4 (H) 12/24/2015   HCT 51.3 (H) 12/24/2015   PLT 268 12/24/2015   GLUCOSE 119 (H) 12/24/2015   CHOL 193 05/15/2013   TRIG 206 (H) 05/15/2013   HDL 45 05/15/2013   LDLCALC 107 (H) 05/15/2013   ALT 40 05/14/2013   AST 31 05/14/2013   NA 139 12/24/2015   K 4.7 12/24/2015   CL 102 12/24/2015   CREATININE 0.95 12/24/2015   BUN 13 12/24/2015   CO2 27 12/24/2015   TSH 1.100 05/14/2013   PSA 1.52 03/21/2007   INR 1.7 (H) 05/08/2014   HGBA1C 6.0 (H) 05/14/2013     BNP (last 3 results) No results for input(s): BNP in the last 8760 hours.  ProBNP (last 3 results) No results for input(s): PROBNP in the last 8760 hours.   Other Studies Reviewed Today:  Echo Study Conclusions 2016  - Procedure narrative: Transthoracic echocardiography. Image   quality was suboptimal. The study was technically difficult.   Intravenous contrast (Definity) was administered. - Left ventricle: The cavity size was mildly dilated. Wall   thickness was normal. No LV thrombus noted on contrast study.   Systolic function was severely reduced. The estimated ejection   fraction was in the range of 25% to 30%. Diffuse hypokinesis with   inferior akinesis. Indeterminant diastolic function (atrial   fibrillation). - Aortic valve: There was no stenosis. - Mitral valve: Mildly calcified annulus. There was  trivial   regurgitation. - Left atrium: The atrium was mildly dilated. - Right ventricle: The cavity size was normal. Systolic function   was normal. - Pulmonary arteries: No complete TR doppler jet so unable to   estimate PA systolic pressure. - Inferior vena cava: The vessel was normal in size. The   respirophasic diameter changes were in the normal range (>= 50%),   consistent with normal central venous pressure.  Impressions:  - Mildly dilated LV with EF 25-30%. Diffuse hypokinesis with   inferior akinesis. No LV thrombus. Normal RV size and systolic   function. No significant valvular abnormalities.  Assessment/Plan:  1. Persistent AF - managed with rate control and anticoagulation. Goal noted to have HR less than 120.   2. Chronic anticoagulation - needs labs today  3. Chronic systolic HF - worsening dyspnea - probably multifactorial - no recent labs. Needs echo updated. Needs labs today - would then favor trying to up titrate his medicines. Looks like he has room with his BP.   Addendum: Labs called 03/01/17 BNP is elevated. Advised to increase his lasix to 60 mg. Upon calling this information he notes that he forgot to let me know that he has only been taking 20 mg. See lab note.   4. CAD - managed medically. Looks like last cath from 2010.   5. Morbid obesity - crux of his issues.   Current medicines are reviewed with the patient today.  The patient does not have concerns regarding medicines other than what has been noted above.  The following changes have been made:  See above.  Labs/ tests ordered today include:    Orders Placed This Encounter  Procedures  . Basic metabolic panel  . CBC  . Hepatic function panel  . Lipid panel  . Pro b natriuretic peptide (BNP)  . EKG 12-Lead  . ECHOCARDIOGRAM COMPLETE     Disposition:   FU with Dr. Marlou Porch after echo - to consider uptitration of his medicines. His overall prognosis seems tenuous at best to me. May need  a pre op clearance as well.   Patient is agreeable to this plan and will call if any problems develop in the interim.   SignedTruitt Merle, NP  02/28/2017 3:56 PM  [facility] [address] [place], [place]  [address] Phone: [phone] Fax: [phone]

## 2017-03-01 ENCOUNTER — Other Ambulatory Visit: Payer: Self-pay | Admitting: *Deleted

## 2017-03-01 LAB — HEPATIC FUNCTION PANEL
ALT: 34 IU/L (ref 0–44)
AST: 30 IU/L (ref 0–40)
Albumin: 3.7 g/dL (ref 3.6–4.8)
Alkaline Phosphatase: 60 IU/L (ref 39–117)
Bilirubin Total: 0.7 mg/dL (ref 0.0–1.2)
Bilirubin, Direct: 0.22 mg/dL (ref 0.00–0.40)
Total Protein: 6.6 g/dL (ref 6.0–8.5)

## 2017-03-01 LAB — CBC
Hematocrit: 48.6 % (ref 37.5–51.0)
Hemoglobin: 16.4 g/dL (ref 13.0–17.7)
MCH: 32.7 pg (ref 26.6–33.0)
MCHC: 33.7 g/dL (ref 31.5–35.7)
MCV: 97 fL (ref 79–97)
Platelets: 220 10*3/uL (ref 150–379)
RBC: 5.02 x10E6/uL (ref 4.14–5.80)
RDW: 14.2 % (ref 12.3–15.4)
WBC: 6.1 10*3/uL (ref 3.4–10.8)

## 2017-03-01 LAB — LIPID PANEL
Chol/HDL Ratio: 5 ratio (ref 0.0–5.0)
Cholesterol, Total: 195 mg/dL (ref 100–199)
HDL: 39 mg/dL — ABNORMAL LOW (ref >39)
LDL Calculated: 123 mg/dL — ABNORMAL HIGH (ref 0–99)
Triglycerides: 167 mg/dL — ABNORMAL HIGH (ref 0–149)
VLDL Cholesterol Cal: 33 mg/dL (ref 5–40)

## 2017-03-01 LAB — BASIC METABOLIC PANEL
BUN/Creatinine Ratio: 12 (ref 10–24)
BUN: 14 mg/dL (ref 8–27)
CO2: 23 mmol/L (ref 20–29)
Calcium: 9.3 mg/dL (ref 8.6–10.2)
Chloride: 101 mmol/L (ref 96–106)
Creatinine, Ser: 1.14 mg/dL (ref 0.76–1.27)
GFR calc Af Amer: 79 mL/min/{1.73_m2} (ref >59)
GFR calc non Af Amer: 68 mL/min/{1.73_m2} (ref >59)
Glucose: 118 mg/dL — ABNORMAL HIGH (ref 65–99)
Potassium: 4.5 mmol/L (ref 3.5–5.2)
Sodium: 139 mmol/L (ref 134–144)

## 2017-03-01 LAB — PRO B NATRIURETIC PEPTIDE: NT-Pro BNP: 2250 pg/mL — ABNORMAL HIGH (ref 0–210)

## 2017-03-08 ENCOUNTER — Other Ambulatory Visit: Payer: Self-pay | Admitting: Cardiology

## 2017-03-09 NOTE — Telephone Encounter (Signed)
Xarelto 20mg  refill request received, pt is 62 yrs old, wt-125.2kg, Crea-1.14 on 02/28/17, last seen by Truitt Merle on 02/28/17, CrCl-117.17ml/min; will send in refill to requested pharmacy.

## 2017-03-17 ENCOUNTER — Other Ambulatory Visit (HOSPITAL_COMMUNITY): Payer: Medicare Other

## 2017-03-17 ENCOUNTER — Ambulatory Visit: Payer: Medicare Other | Admitting: Cardiology

## 2017-03-21 ENCOUNTER — Other Ambulatory Visit: Payer: Self-pay

## 2017-03-21 ENCOUNTER — Encounter: Payer: Self-pay | Admitting: Cardiology

## 2017-03-21 ENCOUNTER — Ambulatory Visit (HOSPITAL_COMMUNITY): Payer: Medicare Other | Attending: Cardiovascular Disease

## 2017-03-21 ENCOUNTER — Ambulatory Visit (INDEPENDENT_AMBULATORY_CARE_PROVIDER_SITE_OTHER): Payer: Medicare Other | Admitting: Cardiology

## 2017-03-21 VITALS — BP 140/86 | HR 92 | Ht 68.0 in | Wt 270.4 lb

## 2017-03-21 DIAGNOSIS — I5022 Chronic systolic (congestive) heart failure: Secondary | ICD-10-CM | POA: Diagnosis present

## 2017-03-21 DIAGNOSIS — I4819 Other persistent atrial fibrillation: Secondary | ICD-10-CM

## 2017-03-21 DIAGNOSIS — I517 Cardiomegaly: Secondary | ICD-10-CM | POA: Diagnosis not present

## 2017-03-21 DIAGNOSIS — I4821 Permanent atrial fibrillation: Secondary | ICD-10-CM

## 2017-03-21 DIAGNOSIS — R0602 Shortness of breath: Secondary | ICD-10-CM | POA: Diagnosis present

## 2017-03-21 DIAGNOSIS — I482 Chronic atrial fibrillation: Secondary | ICD-10-CM | POA: Diagnosis not present

## 2017-03-21 DIAGNOSIS — I481 Persistent atrial fibrillation: Secondary | ICD-10-CM | POA: Diagnosis present

## 2017-03-21 MED ORDER — SILDENAFIL CITRATE 20 MG PO TABS: 20.0000 mg | ORAL_TABLET | Freq: Every day | ORAL | 11 refills | Status: DC | PRN

## 2017-03-21 MED ORDER — SACUBITRIL-VALSARTAN 49-51 MG PO TABS: 1.0000 | ORAL_TABLET | Freq: Two times a day (BID) | ORAL | 11 refills | Status: DC

## 2017-03-21 MED ORDER — PERFLUTREN LIPID MICROSPHERE
1.0000 mL | INTRAVENOUS | Status: AC | PRN
  Administered 2017-03-21: 2 mL via INTRAVENOUS

## 2017-03-21 NOTE — Patient Instructions (Signed)
Medication Instructions:  Please increase Entresto to 49-51 mg a day. Continue all other medications as listed.  Follow-Up: Follow up in 3 months with Dr Marlou Porch.  If you need a refill on your cardiac medications before your next appointment, please call your pharmacy.  Thank you for choosing [facility]!!

## 2017-03-21 NOTE — Progress Notes (Signed)
[facility]. [address], [address] [place],   [address] Phone: [phone] Fax:  [phone]  Date:  03/22/2017   ID:  Norman Rodriguez, DOB 1955/07/30, MRN [id number]  PCP:  [facility]   History of Present Illness: Norman Rodriguez is a 62 y.o. 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   He failed Tikosyn, amiodarone , multiple cardioversions. We are now in rate control strategy.  Complaints have been fairly consistent over the last several visits, shortness of breath with activity.   No new complaints. He understands the importance of the coreg. He has had sexual dysfunction he states. No bleeding, syncope, orthopnea. No chest pain.   Wt Readings from Last 3 Encounters:  03/21/17 270 lb 6.4 oz (122.7 kg)  02/28/17 276 lb (125.2 kg)  06/02/16 273 lb 3.2 oz (123.9 kg)     Past Medical History:  Diagnosis Date  . Arthritis   . Atrial fibrillation ([place])    a. Dx 2010. b. Initiated on Tikosyn 03/2013.  [name] CAD (coronary artery disease)    a. BMS 2.36mm 23 to LAD. CTO of RCA unsuccessful, good left to right collaterals - 2010.  [name] Chronic anticoagulation   . Chronic systolic CHF (congestive heart failure) ([place])    a. Both ischemic and tachy induced. b. Echo 04/2013: EF 30-35%, no RWMA, normal RV, mod dilated LA (before restoration of NSR).  [name] Dysrhythmia    hx of atrial fibrilation  . HTN (hypertension)   . Hyperlipidemia   . NSVT (nonsustained ventricular tachycardia) ([place])   . Obesity   . Shortness of breath     Past Surgical History:  Procedure Laterality Date  . CARDIOVERSION N/A 04/27/2013   Procedure: BEDSIDE CARDIOVERSION;  Surgeon: Sanda Klein, MD;  Location: [facility];  Service: Cardiovascular;  Laterality: N/A;  . CARDIOVERSION N/A 05/15/2013   Procedure: CARDIOVERSION/BEDSIDE;  Surgeon: Larey Dresser, MD;  Location: [place];  Service: Cardiovascular;  Laterality: N/A;  . CARDIOVERSION N/A 05/16/2014   Procedure: CARDIOVERSION;  Surgeon: Jerline Pain, MD;  Location: [facility];  Service: Cardiovascular;  Laterality: N/A;  . CARDIOVERSION N/A 06/07/2014   Procedure: CARDIOVERSION;  Surgeon: Jerline Pain, MD;  Location: [facility];  Service: Cardiovascular;  Laterality: N/A;  . CORONARY ANGIOPLASTY WITH STENT PLACEMENT  2010   BMS 2.64mm 23 to LAD. CTO of RCA unsuccessful, good left to right collaterals  . FOOT SURGERY Right     Current Outpatient Medications  Medication Sig Dispense Refill  . ANORO ELLIPTA 62.5-25 MCG/INH AEPB INHALE 1 PUFF INTO THE LUNGS DAILY. 60 each 5  . atorvastatin (LIPITOR) 40 MG tablet TAKE 1 TABLET (40 MG TOTAL) BY MOUTH DAILY. 90 tablet 3  . B Complex-C (B-COMPLEX WITH VITAMIN C) tablet Take 1 tablet by mouth daily.    . carvedilol (COREG) 12.5 MG tablet TAKE 1 TABLET (12.5 MG TOTAL) BY MOUTH TWO TIMES DAILY WITH A MEAL. 180 tablet 2  . Coenzyme Q10 (CO Q-10) 200 MG CAPS Take 1 capsule by mouth daily.    . fexofenadine (ALLEGRA) 180 MG tablet Take 180 mg by mouth daily.    . furosemide (LASIX) 40 MG tablet TAKE 1 TABLET (40 MG TOTAL) BY MOUTH DAILY. 90 tablet 3  . gabapentin (NEURONTIN) 100 MG capsule Take 500-600 mg by mouth at bedtime. Patient takes 5-6 tablets by mouth  at bedtime    . LevOCARNitine (CARNITINE PO) Take 500 mg by mouth daily.    . Magnesium 400 MG CAPS Take 1 capsule by mouth daily.     . meloxicam (MOBIC) 15 MG tablet Take 15 mg by mouth daily.    . Multiple Vitamin (MULTIVITAMIN) tablet Take 1 tablet by mouth daily.    . nitroGLYCERIN (NITROSTAT) 0.4 MG SL tablet Place 0.4 mg under the tongue every 5 (five) minutes as needed for chest pain.    . NON FORMULARY Take 1 capsule by mouth daily. (Ribose) Take 1 capsule once a day     . NON FORMULARY Take 1 tablet by mouth daily. Glycine viatmin 1 tab po qd    . NON FORMULARY Take 465 mg  by mouth daily. [place]     . Omega-3 Fatty Acids (OMEGA 3 PO) Take 2 capsules by mouth daily.     . potassium chloride SA (K-DUR,KLOR-CON) 20 MEQ tablet TAKE 1 TABLET (20 MEQ TOTAL) BY MOUTH DAILY. 90 tablet 1  . vitamin C (ASCORBIC ACID) 500 MG tablet Take 500 mg by mouth daily.    Alveda Reasons 20 MG TABS tablet TAKE 1 TABLET (20 MG TOTAL) BY MOUTH DAILY WITH SUPPER. 90 tablet 3  . sacubitril-valsartan (ENTRESTO) 49-51 MG Take 1 tablet by mouth 2 (two) times daily. 60 tablet 11  . sildenafil (REVATIO) 20 MG tablet Take 1 tablet (20 mg total) by mouth daily as needed. 10 tablet 11   No current facility-administered medications for this visit.     Allergies:    Allergies  Allergen Reactions  . Apple Hives  . Soybean Oil Other (See Comments)    unknown  . Lisinopril Cough  . Bean Pod Extract Rash    Joint swelling and itching  . Carrot [Daucus Carota] Rash    Joint swelling and itching  . Grass Extracts [Gramineae Pollens] Rash    Joint swelling and itching  . Peach Flavor Rash    Joint swelling and itching  . Tree Extract Rash    Joint swelling and itching    Social History:  The patient  reports that he has quit smoking. His smoking use included cigars. he has never used smokeless tobacco. He reports that he does not drink alcohol or use drugs.   Family History  Problem Relation Age of Onset  . Other Father        Car accident  . Coronary artery disease Neg Hx     ROS:  Please see the history of present illness.  All other ROS neg. PHYSICAL EXAM: VS:  BP 140/86   Pulse 92   Ht 5\' 8"  (1.727 m)   Wt 270 lb 6.4 oz (122.7 kg)   SpO2 92%   BMI 41.11 kg/m  GEN: Well nourished, well developed, in no acute distress, obese  HEENT: normal  Neck: no JVD, carotid bruits, or masses Cardiac: irreg irreg; no murmurs, rubs, or gallops,no edema  Respiratory:  clear to auscultation bilaterally, normal work of breathing GI: soft, nontender, nondistended, + BS MS: no  deformity or atrophy  Skin: warm and dry, no rash Neuro:  Alert and Oriented x 3, Strength and sensation are intact Psych: euthymic mood, full affect    EKG: EKG 06/02/16 shows atrial fibrillation heart rate 107, nonspecific T-wave changes. Personally viewed 07/10/15 shows atrial fibrillation heart rate 94 with PVC, nonspecific ST-T wave changes personally viewed-prior 05/27/14-atrial fibrillation heart rate 88.  ASSESSMENT AND PLAN:  1. Persistent atrial fibrillation- Dr. Tanna Furry note once again reviewed. We will continue with rate control strategy. Continue with carvedilol. Rate is under control. Heart rate less than 120. Expressed the importance of this medication. He may be having some sexual dysfunction associated with this however. Will try sildenafil 20mg .  2. Cardiomyopathy-both tachycardia as well as ischemic-continue to encourage carvedilol 12.5 twice a day.   Doing better with Entresto, increased dose. Multifactorial from obesity, cardiomyopathy, deconditioning. I'm encouraged however that his heart rate is better controlled to allow more efficiency. Stable. 3. Chronic systolic heart failure-ejection fraction 25%.   Entresto 24/26 BID will increase to 48/52mg . Previously on losartan 50 QD. We previously stopped digoxin. Stable. No new symptoms.  4. Chronic anticoagulation-as above. No bleeding. Xarelto.  CBC and basic metabolic profile no changes. Continue, no change 5. Dyspnea-appreciate pulmonary consultation. 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 He is taking inhaler. Long history of smoking. Quit 10 years ago.  6. Coronary artery disease-prior cardiac catheterization reviewed. Cardiomyopathy seems to be out of proportion to CAD.  7. Morbid Obesity-continue to encourage weight loss. No changes. This is in part the crux of his issues.     Signed, [name], MD [facility]  03/22/2017 7:45 AM

## 2017-03-22 ENCOUNTER — Encounter: Payer: Self-pay | Admitting: Cardiology

## 2017-03-25 ENCOUNTER — Telehealth: Payer: Self-pay | Admitting: Cardiology

## 2017-03-25 NOTE — Telephone Encounter (Signed)
New Message    Pt is calling to update his allergy list,he states that he is also allergic to Walnuts, mildew, fungi, basil, and Peaches not peach flavoring. But everything else on the list is correct  Pt c/o medication issue:  1. Name of Medication: Entresto  2. How are you currently taking this medication (dosage and times per day)? Take 1 tablet by mouth 2 (two) times daily.  3. Are you having a reaction (difficulty breathing--STAT)? no  4. What is your medication issue? Pt says that upping his dosage has helped a lot but wants to see if when its time to refill again can he please get qty of 180

## 2017-03-25 NOTE — Telephone Encounter (Signed)
Allergy list updated.

## 2017-03-25 NOTE — Telephone Encounter (Signed)
I placed call to pt to ask what type of allergic reaction he has to items listed. Left message to call back

## 2017-03-25 NOTE — Telephone Encounter (Signed)
Follow up    Patient states for each item listed in previous note he has "swelling in joints", and itching. Patient states he is also allergic to wool.

## 2017-03-28 NOTE — Telephone Encounter (Signed)
This is Dr. Skains patient 

## 2017-04-11 ENCOUNTER — Telehealth: Payer: Self-pay | Admitting: Cardiology

## 2017-04-11 NOTE — Telephone Encounter (Signed)
Pt calling wanting Dr. Marlou Porch to change his medication strength of sildenafil 20 mg tablet. Pt stated that he could get his medication cheaper if his medication was 100 mg tablet. Pt would like a call back concerning this matter. Please address.

## 2017-04-13 NOTE — Telephone Encounter (Signed)
Left message for pt Dr Marlou Porch gave orders to increase Sildenafil to 100 mg tablets and requested pt to c/b to verify pharmacy.

## 2017-04-13 NOTE — Telephone Encounter (Signed)
Will forward to MD to review 

## 2017-04-13 NOTE — Telephone Encounter (Signed)
OK to give sildenafil 100mg  PO QD PRN. 15 tabs, 6 refills.  [name], MD

## 2017-04-14 ENCOUNTER — Telehealth: Payer: Self-pay | Admitting: Cardiology

## 2017-04-14 MED ORDER — SILDENAFIL CITRATE 100 MG PO TABS: 100.0000 mg | ORAL_TABLET | Freq: Every day | ORAL | 6 refills | Status: DC | PRN

## 2017-04-14 NOTE — Telephone Encounter (Signed)
Patient called in [place] you could use to send Sildenatil 100mg  prescriptions to is  [facility] # [phone] [address] [id number]

## 2017-04-14 NOTE — Telephone Encounter (Signed)
Patient Calls   Norman Rodriguez, Norman Rodriguez routed conversation to You 16 minutes ago (9:54 AM)    Norman Rodriguez, Norman Rodriguez 18 minutes ago (9:52 AM)      Patient called in [place] you could use to send Sildenatil 100mg  prescriptions to is  [facility] # [phone] [facility] [id number]       Documentation     Naif, Alabi [phone]  Norman Rodriguez, Norman Rodriguez

## 2017-05-03 ENCOUNTER — Telehealth: Payer: Self-pay | Admitting: Cardiology

## 2017-05-03 NOTE — Telephone Encounter (Signed)
Patient calling,  States that he believes that he was diagnosed with an enlarged heart at some point in time. Patient would like to verify the treatment for that diagnosis.

## 2017-05-03 NOTE — Telephone Encounter (Signed)
Pt was interested in the tx Dr Marlou Porch is doing for him to help control his CHF. His aunt is in the hospital in [place] with CHF and he want to let her Dr's know what has been working for him. We reviewed his medications, Na and fluid intake, and exercise. He stated he appreciated me calling and reviewing this with him. He had no additional questions.

## 2017-05-12 ENCOUNTER — Other Ambulatory Visit: Payer: Self-pay | Admitting: Pulmonary Disease

## 2017-05-23 ENCOUNTER — Ambulatory Visit (INDEPENDENT_AMBULATORY_CARE_PROVIDER_SITE_OTHER): Payer: Medicare Other | Admitting: Physician Assistant

## 2017-05-23 ENCOUNTER — Encounter (INDEPENDENT_AMBULATORY_CARE_PROVIDER_SITE_OTHER): Payer: Self-pay

## 2017-05-23 ENCOUNTER — Encounter: Payer: Self-pay | Admitting: Physician Assistant

## 2017-05-23 VITALS — BP 142/82 | HR 86 | Ht 68.0 in | Wt 269.0 lb

## 2017-05-23 DIAGNOSIS — Z7901 Long term (current) use of anticoagulants: Secondary | ICD-10-CM

## 2017-05-23 DIAGNOSIS — Z1211 Encounter for screening for malignant neoplasm of colon: Secondary | ICD-10-CM
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
## 2017-05-23 NOTE — Patient Instructions (Signed)
Your provider has ordered Cologuard testing as an option for colon cancer screening. This is performed by [employer] and may be out of network with your insurance. PRIOR to completing the test, it is YOUR responsibility to contact your insurance about covered benefits for this test. Your out of pocket expense could be anywhere from $0.00 to $649.00.   When you call to check coverage with your insurer, please provide the following information:   -The ONLY provider of Cologuard is [place] code for Cologuard is [phone].  [employer] NPI # [id number]  -[employer] Tax ID # [id number]   We have already sent your demographic and insurance information to [employer] (phone number [phone]) and they should contact you within the next week regarding your test. If you have not heard from them within the next week, please call our office at [phone].   If you are age 33 or younger, your body mass index should be between 19-25. Your Body mass index is 40.9 kg/m. If this is out of the aformentioned range listed, please consider follow up with your Primary Care Provider.

## 2017-05-24 NOTE — Progress Notes (Signed)
Agree with assessment and plan as outlined. Colonoscopy would be the preferable way to screen this patient, however given some of the issues as outlined will screen with stool study initially.

## 2017-06-06 ENCOUNTER — Other Ambulatory Visit: Payer: Self-pay | Admitting: Cardiology

## 2017-06-13 ENCOUNTER — Other Ambulatory Visit: Payer: Self-pay

## 2017-06-13 ENCOUNTER — Telehealth: Payer: Self-pay

## 2017-06-13 LAB — COLOGUARD: Cologuard: NEGATIVE

## 2017-06-13 NOTE — Telephone Encounter (Signed)
I left a message on his voicemail and set a 3 year recall for screening Cologaurd.

## 2017-06-13 NOTE — Telephone Encounter (Signed)
-----   Message from Alfredia Ferguson, PA-C sent at 06/13/2017  1:13 PM EDT ----- Please let pt know Cologuard test was negative - he should have some form of colon screening again in 3 years -please put him in for Cologuard /Colon cancer screening reminder in 3 years

## 2017-06-24 ENCOUNTER — Other Ambulatory Visit: Payer: Self-pay | Admitting: Cardiology

## 2017-06-24 ENCOUNTER — Other Ambulatory Visit: Payer: Self-pay | Admitting: Pulmonary Disease

## 2017-06-28 ENCOUNTER — Ambulatory Visit: Payer: Medicare Other | Admitting: Cardiology

## 2017-07-08 ENCOUNTER — Other Ambulatory Visit: Payer: Self-pay | Admitting: Ophthalmology

## 2017-07-08 DIAGNOSIS — H53461 Homonymous bilateral field defects, right side: Secondary | ICD-10-CM

## 2017-07-20 ENCOUNTER — Ambulatory Visit
Admission: RE | Admit: 2017-07-20 | Discharge: 2017-07-20 | Disposition: A | Discharge status: Home / Self Care | Payer: Medicare Other | Source: Ambulatory Visit | Attending: Ophthalmology | Admitting: Ophthalmology

## 2017-07-20 DIAGNOSIS — H53461 Homonymous bilateral field defects, right side: Secondary | ICD-10-CM

## 2017-07-20 MED ORDER — GADOBENATE DIMEGLUMINE 529 MG/ML IV SOLN
20.0000 mL | Freq: Once | INTRAVENOUS | Status: AC | PRN
  Administered 2017-07-20: 20 mL via INTRAVENOUS

## 2017-07-29 ENCOUNTER — Other Ambulatory Visit: Payer: Self-pay | Admitting: Pulmonary Disease

## 2017-07-29 ENCOUNTER — Telehealth: Payer: Self-pay | Admitting: Cardiology

## 2017-07-29 NOTE — Telephone Encounter (Signed)
Left message for pt to call back  °

## 2017-07-29 NOTE — Telephone Encounter (Signed)
Spoke with the patient and he is asking to decrease his Lasix from 40mg  to 20mg . He reports that he was at [facility] having an MRI and when the nurse/tech could not get his IV started and that after many people tried, they had stated to him that he was "dry and dehydrated". He also notes that he has been experiencing constipation which is the only other symptom he can report that may indicate that he is lacking fluid. He says he has been hydrating well especially on really hot days with water and Gatorade. I advised pt that Dr. Marlou Porch would have to make that decision and would need labwork but pt says he cannot come in before his 08/11/17 appt with Dr. Marlou Porch. I advised him that I will forward message to Dr. Lorriane Shire and if any recommendations will call him back and if not will see him at his OV to further discuss.

## 2017-07-29 NOTE — Telephone Encounter (Signed)
New message    Patient calling    Pt c/o medication issue:  1. Name of Medication: furosemide (LASIX) 40 MG tablet  2. How are you currently taking this medication (dosage and times per day)? TAKE 1 TABLET (40 MG TOTAL) BY MOUTH DAILY.  3. Are you having a reaction (difficulty breathing--STAT)? no  4. What is your medication issue? Wants to know can he decrease medication

## 2017-07-30 NOTE — Telephone Encounter (Signed)
Can try to decrease lasix from 40 to 20mg .  Weight daily.  If up 2-3 pounds, take additional 40 of lasix as this is fluid weight.  [name], MD

## 2017-08-01 MED ORDER — FUROSEMIDE 40 MG PO TABS: 20.0000 mg | ORAL_TABLET | Freq: Every day | ORAL | Status: DC

## 2017-08-01 NOTE — Telephone Encounter (Signed)
Patient called back. Informed patient of Dr. Marlou Porch' recommendation. Patient will start taking lasix 20 mg by mouth daily. Patient will start daily weights and if his is up 2 to 3 pounds he will take additional 40 mg of lasix as this is fluid weight. Patient verbalized understanding.

## 2017-08-01 NOTE — Telephone Encounter (Signed)
Left message for patient to call back  

## 2017-08-11 ENCOUNTER — Ambulatory Visit (INDEPENDENT_AMBULATORY_CARE_PROVIDER_SITE_OTHER): Payer: Medicare Other | Admitting: Cardiology

## 2017-08-11 ENCOUNTER — Encounter: Payer: Self-pay | Admitting: Cardiology

## 2017-08-11 VITALS — BP 128/90 | HR 93 | Ht 68.0 in | Wt 275.0 lb

## 2017-08-11 DIAGNOSIS — I4821 Permanent atrial fibrillation: Secondary | ICD-10-CM

## 2017-08-11 DIAGNOSIS — I5022 Chronic systolic (congestive) heart failure: Secondary | ICD-10-CM

## 2017-08-11 DIAGNOSIS — I482 Chronic atrial fibrillation: Secondary | ICD-10-CM | POA: Diagnosis not present

## 2017-08-11 NOTE — Progress Notes (Signed)
[facility]. [address], [address], [place]  [address] Phone: [phone] Fax:  [phone]  Date:  08/11/2017   ID:  Norman Rodriguez, DOB 11-22-1955, MRN [id number]  PCP:  [facility]   History of Present Illness: Norman Rodriguez is a 62 y.o. 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   He failed Tikosyn, amiodarone , multiple cardioversions. We are now in rate control strategy.  Complaints have been fairly consistent over the last several visits, shortness of breath with activity.   No new complaints. He understands the importance of the coreg. He has had sexual dysfunction he states. No bleeding, syncope, orthopnea. No chest pain.   08/11/17 - heat is causes SOB. Likes AC.   Wt Readings from Last 3 Encounters:  08/11/17 275 lb (124.7 kg)  05/23/17 269 lb (122 kg)  03/21/17 270 lb 6.4 oz (122.7 kg)     Past Medical History:  Diagnosis Date  . Arthritis   . Atrial fibrillation ([facility])    a. Dx 2010. b. Initiated on Tikosyn 03/2013.  [name] CAD (coronary artery disease)    a. BMS 2.30mm 23 to LAD. CTO of RCA unsuccessful, good left to right collaterals - 2010.  [name] Chronic anticoagulation   . Chronic systolic CHF (congestive heart failure) ([facility])    a. Both ischemic and tachy induced. b. Echo 04/2013: EF 30-35%, no RWMA, normal RV, mod dilated LA (before restoration of NSR).  [name] Dysrhythmia    hx of atrial fibrilation  . HTN (hypertension)   . Hyperlipidemia   . NSVT (nonsustained ventricular tachycardia) ([place])   . Obesity   . Shortness of breath     Past Surgical History:  Procedure Laterality Date  . CARDIOVERSION N/A 04/27/2013   Procedure: BEDSIDE CARDIOVERSION;  Surgeon: Sanda Klein, MD;  Location: [facility];  Service: Cardiovascular;  Laterality: N/A;  . CARDIOVERSION N/A 05/15/2013   Procedure:  CARDIOVERSION/BEDSIDE;  Surgeon: Larey Dresser, MD;  Location: [facility];  Service: Cardiovascular;  Laterality: N/A;  . CARDIOVERSION N/A 05/16/2014   Procedure: CARDIOVERSION;  Surgeon: Jerline Pain, MD;  Location: [facility];  Service: Cardiovascular;  Laterality: N/A;  . CARDIOVERSION N/A 06/07/2014   Procedure: CARDIOVERSION;  Surgeon: Jerline Pain, MD;  Location: [facility];  Service: Cardiovascular;  Laterality: N/A;  . CORONARY ANGIOPLASTY WITH STENT PLACEMENT  2010   BMS 2.30mm 23 to LAD. CTO of RCA unsuccessful, good left to right collaterals  . FOOT SURGERY Right     Current Outpatient Medications  Medication Sig Dispense Refill  . ANORO ELLIPTA 62.5-25 MCG/INH AEPB INHALE 1 PUFF INTO THE LUNGS DAILY 60 each 0  . atorvastatin (LIPITOR) 40 MG tablet TAKE 1 TABLET (40 MG TOTAL) BY MOUTH DAILY. 90 tablet 3  . B Complex-C (B-COMPLEX WITH VITAMIN C) tablet Take 1 tablet by mouth daily.    . carvedilol (COREG) 12.5 MG tablet TAKE 1 TABLET (12.5 MG TOTAL) BY MOUTH TWO TIMES DAILY WITH A MEAL. 180 tablet 2  . Coenzyme Q10 (CO Q-10) 200 MG CAPS Take 1 capsule by mouth daily.    . fexofenadine (ALLEGRA) 180 MG tablet Take 180 mg by mouth daily.    . furosemide (LASIX) 40 MG tablet Take 0.5 tablets (20 mg total) by mouth daily. Take an additional 40 mg by mouth as needed for fluid weight gain.    [name]  gabapentin (NEURONTIN) 300 MG capsule Take 300 mg by mouth 3 (three) times daily.    . LevOCARNitine (CARNITINE PO) Take 500 mg by mouth daily.    . Magnesium 400 MG CAPS Take 1 capsule by mouth daily.     . meloxicam (MOBIC) 15 MG tablet Take 15 mg by mouth daily.    . Multiple Vitamin (MULTIVITAMIN) tablet Take 1 tablet by mouth daily.    . nitroGLYCERIN (NITROSTAT) 0.4 MG SL tablet Place 0.4 mg under the tongue every 5 (five) minutes as needed for chest pain.    . NON FORMULARY Take 1 capsule by mouth daily. (Ribose) Take 1 capsule once a day     . NON FORMULARY Take 1 tablet by mouth daily.  Glycine viatmin 1 tab po qd    . NON FORMULARY Take 465 mg by mouth daily. [place]     . Omega-3 Fatty Acids (OMEGA 3 PO) Take 2 capsules by mouth daily.     . potassium chloride SA (K-DUR,KLOR-CON) 20 MEQ tablet TAKE 1 TABLET (20 MEQ TOTAL) BY MOUTH DAILY. 90 tablet 2  . sacubitril-valsartan (ENTRESTO) 49-51 MG Take 1 tablet by mouth 2 (two) times daily. 60 tablet 11  . sildenafil (VIAGRA) 100 MG tablet Take 1 tablet (100 mg total) by mouth daily as needed for erectile dysfunction. 15 tablet 6  . vitamin C (ASCORBIC ACID) 500 MG tablet Take 500 mg by mouth daily.    Alveda Reasons 20 MG TABS tablet TAKE 1 TABLET (20 MG TOTAL) BY MOUTH DAILY WITH SUPPER. 90 tablet 3   No current facility-administered medications for this visit.     Allergies:    Allergies  Allergen Reactions  . Apple Hives  . Soybean Oil Other (See Comments)    unknown  . Lisinopril Cough  . Other Itching    Pt has itching and joint swelling with walnuts, mildew, fungi, basil and peaches  . Bean Pod Extract Rash    Joint swelling and itching  . Carrot [Daucus Carota] Rash    Joint swelling and itching  . Grass Extracts [Gramineae Pollens] Rash    Joint swelling and itching  . Peach Flavor Rash    Joint swelling and itching  . Tree Extract Rash    Joint swelling and itching    Social History:  The patient  reports that he has quit smoking. His smoking use included cigars. He has never used smokeless tobacco. He reports that he does not drink alcohol or use drugs.   Family History  Problem Relation Age of Onset  . Other Father        Car accident  . Coronary artery disease Neg Hx     ROS:  Please see the history of present illness.  All other ROS neg. PHYSICAL EXAM: VS:  BP 128/90   Pulse 93   Ht 5\' 8"  (1.727 m)   Wt 275 lb (124.7 kg)   SpO2 98%   BMI 41.81 kg/m  GEN: Well nourished, well developed, in no acute distress  HEENT: normal  Neck: no JVD, carotid bruits, or masses Cardiac:  IRRR; no murmurs, rubs, or gallops,no edema  Respiratory:  clear to auscultation bilaterally, normal work of breathing GI: soft, nontender, nondistended, + BS MS: no deformity or atrophy  Skin: warm and dry, no rash, reddness Neuro:  Alert and Oriented x 3, Strength and sensation are intact Psych: euthymic mood, full affect     EKG: EKG 06/02/16 shows atrial fibrillation  heart rate 107, nonspecific T-wave changes. Personally viewed 07/10/15 shows atrial fibrillation heart rate 94 with PVC, nonspecific ST-T wave changes personally viewed-prior 05/27/14-atrial fibrillation heart rate 88.  ASSESSMENT AND PLAN:  1. Permanent atrial fibrillation- Dr. Tanna Furry has evaluated him in the past. We will continue with rate control strategy. Continue with carvedilol. Rate is under control. Heart rate less than 120. Expressed the importance of this medication. He may be having some sexual dysfunction associated with this however in the past. Will try sildenafil 20mg .  He did not mention ED today at this visit.  Overall I am pleased with his atrial fibrillation process. 2. Cardiomyopathy-both tachycardia as well as ischemic-continue to encourage carvedilol 12.5 twice a day.   Doing better with Entresto, increased dose.  Doing much better.  He likes this new medicine.  Multifactorial from obesity, cardiomyopathy, deconditioning. I'm encouraged however that his heart rate is better controlled to allow more efficiency. Stable. 3. Chronic systolic heart failure-ejection fraction 25%.   Entresto 48/52mg . Previously on losartan 50 QD. We previously stopped digoxin. Stable. No new symptoms.  Monitoring.  No changes. 4. Chronic anticoagulation-as above. No bleeding. Xarelto.  CBC and basic metabolic profile no changes.  Seems to be doing well. 5. Dyspnea-appreciate pulmonary consultation. 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 He is taking inhaler. Long history of  smoking. Quit 10 years ago.  Overall doing well. 6. Coronary artery disease-prior cardiac catheterization reviewed. Cardiomyopathy seems to be out of proportion to CAD.  7. Morbid Obesity-continue to encourage weight loss. No changes. This is in part the crux of his issues.   64-month follow-up with Cecille Rubin, 8 months with me.  Signed, [name], MD [facility]  08/11/2017 5:01 PM

## 2017-08-11 NOTE — Patient Instructions (Signed)
Medication Instructions:  The current medical regimen is effective;  continue present plan and medications.  Follow-Up: Follow up in 4 months with Truitt Merle, NP.  Follow up in 8 months with Dr Marlou Porch,  If you need a refill on your cardiac medications before your next appointment, please call your pharmacy.  Thank you for choosing [facility]!!

## 2017-09-02 ENCOUNTER — Other Ambulatory Visit: Payer: Self-pay | Admitting: Pulmonary Disease

## 2017-09-30 ENCOUNTER — Other Ambulatory Visit: Payer: Self-pay | Admitting: Pulmonary Disease

## 2017-09-30 NOTE — Telephone Encounter (Signed)
At this time RA is out of the office until 10-10-17 Would you please refill the Anoro for the pt  Routing message to B.Mack.

## 2017-10-06 ENCOUNTER — Encounter: Payer: Self-pay | Admitting: *Deleted

## 2017-10-17 ENCOUNTER — Other Ambulatory Visit: Payer: Self-pay | Admitting: *Deleted

## 2017-10-17 ENCOUNTER — Telehealth: Payer: Self-pay | Admitting: Cardiology

## 2017-10-17 MED ORDER — SACUBITRIL-VALSARTAN 49-51 MG PO TABS: 1.0000 | ORAL_TABLET | Freq: Two times a day (BID) | ORAL | 2 refills | Status: DC

## 2017-10-17 NOTE — Telephone Encounter (Signed)
New Message:     Patient stated blood has been in his stool for a week and is requesting a call back

## 2017-10-17 NOTE — Telephone Encounter (Signed)
Patient is calling in regards to having blood in his stool over the last week and a half.  He has been on Xarelto for awhile and this is his first time experiencing this.  I informed him to call his PCP, which he did, and they recommended calling our office.  He will monitor this and call us later in the week to give [place] an update.

## 2017-10-19 ENCOUNTER — Encounter: Payer: Self-pay | Admitting: *Deleted

## 2017-10-19 NOTE — Telephone Encounter (Signed)
Thank you for update.   Please refer him to gastroenterology for evaluation of blood in stool.  [name], MD

## 2017-10-20 ENCOUNTER — Telehealth: Payer: Self-pay | Admitting: Cardiology

## 2017-10-20 NOTE — Telephone Encounter (Signed)
Thanks Mark Skains, MD  

## 2017-10-20 NOTE — Telephone Encounter (Signed)
Left message for patient to call back  

## 2017-10-20 NOTE — Telephone Encounter (Signed)
Patient stated he was back being clear, no more blood in his stool. Patient stated he did not need a refer gastroenterology. Has had two days of normal stools. Patient stated it was due to indomethacin medication, and he stopped taking and everything cleared up.  Will forward to Dr. Marlou Porch, so he is aware.

## 2017-10-20 NOTE — Telephone Encounter (Signed)
Follow Up:    Pt said he talked to a nurse earlier and he was feeling better. He says now it is back.. He said made an appiontment with [facility] for 11-02-17.

## 2017-10-21 NOTE — Telephone Encounter (Signed)
LVM for return call. 

## 2017-10-21 NOTE — Telephone Encounter (Signed)
Pt calling to state he has stopped taking his indomethacin and continued to have dark stools. I have advised pt to keep his scheduled appointment with his GI Dr. If he has s/s of continued bleeding, low bp, dizziness, diaphoresis, etc.the patient should go to the ED for further evaluation. Pt questioned if he begins to feel this way, should he call [facility]. I advised him to go directly to the ED for s/s of GI bleeding. Pt has verbalized understanding and had no additional questions.

## 2017-10-22 ENCOUNTER — Other Ambulatory Visit: Payer: Self-pay

## 2017-10-22 ENCOUNTER — Encounter (HOSPITAL_COMMUNITY): Payer: Self-pay | Admitting: Internal Medicine

## 2017-10-22 ENCOUNTER — Inpatient Hospital Stay (HOSPITAL_COMMUNITY)
Admission: EM | Admit: 2017-10-22 | Discharge: 2017-10-28 | DRG: 394 | Disposition: A | Discharge status: Home / Self Care | Payer: Medicare Other | Attending: Family Medicine | Admitting: Family Medicine

## 2017-10-22 DIAGNOSIS — E669 Obesity, unspecified: Secondary | ICD-10-CM | POA: Diagnosis present

## 2017-10-22 DIAGNOSIS — I4729 Other ventricular tachycardia: Secondary | ICD-10-CM

## 2017-10-22 DIAGNOSIS — K5730 Diverticulosis of large intestine without perforation or abscess without bleeding: Secondary | ICD-10-CM | POA: Diagnosis present

## 2017-10-22 DIAGNOSIS — I110 Hypertensive heart disease with heart failure: Secondary | ICD-10-CM | POA: Diagnosis present

## 2017-10-22 DIAGNOSIS — Z6841 Body Mass Index (BMI) 40.0 and over, adult: Secondary | ICD-10-CM | POA: Diagnosis not present

## 2017-10-22 DIAGNOSIS — K210 Gastro-esophageal reflux disease with esophagitis: Secondary | ICD-10-CM | POA: Diagnosis not present

## 2017-10-22 DIAGNOSIS — Z23 Encounter for immunization: Secondary | ICD-10-CM

## 2017-10-22 DIAGNOSIS — E785 Hyperlipidemia, unspecified: Secondary | ICD-10-CM | POA: Diagnosis present

## 2017-10-22 DIAGNOSIS — Z791 Long term (current) use of non-steroidal anti-inflammatories (NSAID): Secondary | ICD-10-CM

## 2017-10-22 DIAGNOSIS — Z79899 Other long term (current) drug therapy: Secondary | ICD-10-CM

## 2017-10-22 DIAGNOSIS — R531 Weakness: Secondary | ICD-10-CM | POA: Diagnosis not present

## 2017-10-22 DIAGNOSIS — K648 Other hemorrhoids: Secondary | ICD-10-CM | POA: Diagnosis present

## 2017-10-22 DIAGNOSIS — K922 Gastrointestinal hemorrhage, unspecified: Secondary | ICD-10-CM

## 2017-10-22 DIAGNOSIS — K621 Rectal polyp: Secondary | ICD-10-CM

## 2017-10-22 DIAGNOSIS — D120 Benign neoplasm of cecum: Secondary | ICD-10-CM | POA: Diagnosis present

## 2017-10-22 DIAGNOSIS — K625 Hemorrhage of anus and rectum: Secondary | ICD-10-CM | POA: Diagnosis not present

## 2017-10-22 DIAGNOSIS — R42 Dizziness and giddiness: Secondary | ICD-10-CM | POA: Diagnosis not present

## 2017-10-22 DIAGNOSIS — I482 Chronic atrial fibrillation, unspecified: Secondary | ICD-10-CM | POA: Diagnosis present

## 2017-10-22 DIAGNOSIS — Z7901 Long term (current) use of anticoagulants: Secondary | ICD-10-CM | POA: Diagnosis not present

## 2017-10-22 DIAGNOSIS — I480 Paroxysmal atrial fibrillation: Secondary | ICD-10-CM | POA: Diagnosis present

## 2017-10-22 DIAGNOSIS — J984 Other disorders of lung: Secondary | ICD-10-CM | POA: Diagnosis present

## 2017-10-22 DIAGNOSIS — K921 Melena: Secondary | ICD-10-CM | POA: Diagnosis present

## 2017-10-22 DIAGNOSIS — K626 Ulcer of anus and rectum: Principal | ICD-10-CM | POA: Diagnosis present

## 2017-10-22 DIAGNOSIS — I472 Ventricular tachycardia: Secondary | ICD-10-CM | POA: Diagnosis present

## 2017-10-22 DIAGNOSIS — D124 Benign neoplasm of descending colon: Secondary | ICD-10-CM | POA: Diagnosis present

## 2017-10-22 DIAGNOSIS — K3189 Other diseases of stomach and duodenum: Secondary | ICD-10-CM | POA: Diagnosis present

## 2017-10-22 DIAGNOSIS — R0600 Dyspnea, unspecified: Secondary | ICD-10-CM | POA: Diagnosis present

## 2017-10-22 DIAGNOSIS — I10 Essential (primary) hypertension: Secondary | ICD-10-CM | POA: Diagnosis present

## 2017-10-22 DIAGNOSIS — Z86718 Personal history of other venous thrombosis and embolism: Secondary | ICD-10-CM

## 2017-10-22 DIAGNOSIS — Z87891 Personal history of nicotine dependence: Secondary | ICD-10-CM

## 2017-10-22 DIAGNOSIS — D127 Benign neoplasm of rectosigmoid junction: Secondary | ICD-10-CM | POA: Diagnosis present

## 2017-10-22 DIAGNOSIS — D128 Benign neoplasm of rectum: Secondary | ICD-10-CM | POA: Diagnosis present

## 2017-10-22 DIAGNOSIS — K635 Polyp of colon: Secondary | ICD-10-CM

## 2017-10-22 DIAGNOSIS — R0689 Other abnormalities of breathing: Secondary | ICD-10-CM

## 2017-10-22 DIAGNOSIS — I2510 Atherosclerotic heart disease of native coronary artery without angina pectoris: Secondary | ICD-10-CM | POA: Diagnosis present

## 2017-10-22 DIAGNOSIS — I5022 Chronic systolic (congestive) heart failure: Secondary | ICD-10-CM | POA: Diagnosis present

## 2017-10-22 DIAGNOSIS — M1990 Unspecified osteoarthritis, unspecified site: Secondary | ICD-10-CM | POA: Diagnosis present

## 2017-10-22 DIAGNOSIS — E869 Volume depletion, unspecified: Secondary | ICD-10-CM | POA: Diagnosis present

## 2017-10-22 DIAGNOSIS — Z888 Allergy status to other drugs, medicaments and biological substances status: Secondary | ICD-10-CM

## 2017-10-22 DIAGNOSIS — D123 Benign neoplasm of transverse colon: Secondary | ICD-10-CM | POA: Diagnosis present

## 2017-10-22 DIAGNOSIS — Z91018 Allergy to other foods: Secondary | ICD-10-CM

## 2017-10-22 DIAGNOSIS — J449 Chronic obstructive pulmonary disease, unspecified: Secondary | ICD-10-CM

## 2017-10-22 DIAGNOSIS — Z955 Presence of coronary angioplasty implant and graft: Secondary | ICD-10-CM

## 2017-10-22 DIAGNOSIS — I4891 Unspecified atrial fibrillation: Secondary | ICD-10-CM | POA: Diagnosis present

## 2017-10-22 HISTORY — DX: Chronic obstructive pulmonary disease, unspecified: J44.9

## 2017-10-22 HISTORY — DX: Hemorrhage of anus and rectum: K62.5

## 2017-10-22 LAB — COMPREHENSIVE METABOLIC PANEL
ALT: 35 U/L (ref 0–44)
AST: 30 U/L (ref 15–41)
Albumin: 3.9 g/dL (ref 3.5–5.0)
Alkaline Phosphatase: 41 U/L (ref 38–126)
Anion gap: 8 (ref 5–15)
BUN: 16 mg/dL (ref 8–23)
CO2: 26 mmol/L (ref 22–32)
Calcium: 9.2 mg/dL (ref 8.9–10.3)
Chloride: 105 mmol/L (ref 98–111)
Creatinine, Ser: 1 mg/dL (ref 0.61–1.24)
GFR calc Af Amer: >60 mL/min (ref >60)
GFR calc non Af Amer: >60 mL/min (ref >60)
Glucose, Bld: 123 mg/dL — ABNORMAL HIGH (ref 70–99)
Potassium: 4.5 mmol/L (ref 3.5–5.1)
Sodium: 139 mmol/L (ref 135–145)
Total Bilirubin: 1.3 mg/dL — ABNORMAL HIGH (ref 0.3–1.2)
Total Protein: 7 g/dL (ref 6.5–8.1)

## 2017-10-22 LAB — CBC
HCT: 44.8 % (ref 39.0–52.0)
HEMOGLOBIN: 15.4 g/dL (ref 13.0–17.0)
MCH: 33.2 pg (ref 26.0–34.0)
MCHC: 34.4 g/dL (ref 30.0–36.0)
MCV: 96.6 fL (ref 78.0–100.0)
Platelets: 198 10*3/uL (ref 150–400)
RBC: 4.64 MIL/uL (ref 4.22–5.81)
RDW: 14.3 % (ref 11.5–15.5)
WBC: 7 10*3/uL (ref 4.0–10.5)

## 2017-10-22 LAB — TYPE AND SCREEN
ABO/RH(D): A POS
Antibody Screen: NEGATIVE

## 2017-10-22 LAB — PROTIME-INR
INR: 1.26
PROTHROMBIN TIME: 15.7 s — AB (ref 11.4–15.2)

## 2017-10-22 LAB — HEMATOCRIT: HCT: 45.5 % (ref 39.0–52.0)

## 2017-10-22 LAB — POC OCCULT BLOOD, ED: FECAL OCCULT BLD: POSITIVE — AB

## 2017-10-22 MED ORDER — ATORVASTATIN CALCIUM 40 MG PO TABS
40.0000 mg | ORAL_TABLET | Freq: Every day | ORAL | Status: DC
  Administered 2017-10-27: 40 mg via ORAL
  Filled 2017-10-22 (×3): qty 1

## 2017-10-22 MED ORDER — OMEGA-3-ACID ETHYL ESTERS 1 G PO CAPS
2.0000 g | ORAL_CAPSULE | Freq: Every day | ORAL | Status: DC
  Administered 2017-10-22 – 2017-10-25 (×4): 2 g via ORAL
  Filled 2017-10-22 (×4): qty 2

## 2017-10-22 MED ORDER — OMEGA 3 340 MG PO CPDR: DELAYED_RELEASE_CAPSULE | Freq: Every day | ORAL | Status: DC

## 2017-10-22 MED ORDER — B COMPLEX-C PO TABS
1.0000 | ORAL_TABLET | Freq: Every day | ORAL | Status: DC
  Administered 2017-10-22 – 2017-10-28 (×7): 1 via ORAL
  Filled 2017-10-22 (×7): qty 1

## 2017-10-22 MED ORDER — TRAMADOL HCL 50 MG PO TABS: 50.0000 mg | ORAL_TABLET | Freq: Four times a day (QID) | ORAL | Status: DC | PRN

## 2017-10-22 MED ORDER — LORATADINE 10 MG PO TABS
10.0000 mg | ORAL_TABLET | Freq: Every day | ORAL | Status: DC
  Administered 2017-10-22 – 2017-10-27 (×2): 10 mg via ORAL
  Filled 2017-10-22 (×7): qty 1

## 2017-10-22 MED ORDER — SODIUM CHLORIDE 0.9 % IV SOLN
80.0000 mg | Freq: Once | INTRAVENOUS | Status: AC
  Administered 2017-10-22: 80 mg via INTRAVENOUS
  Filled 2017-10-22: qty 80

## 2017-10-22 MED ORDER — UMECLIDINIUM-VILANTEROL 62.5-25 MCG/INH IN AEPB
1.0000 | INHALATION_SPRAY | Freq: Every day | RESPIRATORY_TRACT | Status: DC
  Administered 2017-10-25 – 2017-10-28 (×4): 1 via RESPIRATORY_TRACT
  Filled 2017-10-22: qty 14

## 2017-10-22 MED ORDER — ONDANSETRON HCL 4 MG/2ML IJ SOLN: 4.0000 mg | Freq: Four times a day (QID) | INTRAMUSCULAR | Status: DC | PRN

## 2017-10-22 MED ORDER — INFLUENZA VAC SPLIT QUAD 0.5 ML IM SUSY
0.5000 mL | PREFILLED_SYRINGE | INTRAMUSCULAR | Status: AC
  Administered 2017-10-23: 0.5 mL via INTRAMUSCULAR
  Filled 2017-10-22: qty 0.5

## 2017-10-22 MED ORDER — SODIUM CHLORIDE 0.9 % IV SOLN
INTRAVENOUS | Status: AC
  Administered 2017-10-22 – 2017-10-23 (×2): via INTRAVENOUS

## 2017-10-22 MED ORDER — MAGNESIUM OXIDE 400 (241.3 MG) MG PO TABS
400.0000 mg | ORAL_TABLET | Freq: Every day | ORAL | Status: DC
  Administered 2017-10-22 – 2017-10-28 (×7): 400 mg via ORAL
  Filled 2017-10-22 (×7): qty 1

## 2017-10-22 MED ORDER — ACETAMINOPHEN 650 MG RE SUPP: 650.0000 mg | Freq: Four times a day (QID) | RECTAL | Status: DC | PRN

## 2017-10-22 MED ORDER — ACETAMINOPHEN 325 MG PO TABS: 650.0000 mg | ORAL_TABLET | Freq: Four times a day (QID) | ORAL | Status: DC | PRN

## 2017-10-22 MED ORDER — MAGNESIUM 400 MG PO CAPS: 1.0000 | ORAL_CAPSULE | Freq: Every day | ORAL | Status: DC

## 2017-10-22 MED ORDER — GABAPENTIN 300 MG PO CAPS: 600.0000 mg | ORAL_CAPSULE | Freq: Three times a day (TID) | ORAL | Status: DC | PRN

## 2017-10-22 MED ORDER — SODIUM CHLORIDE 0.9 % IV SOLN
8.0000 mg/h | INTRAVENOUS | Status: DC
  Administered 2017-10-22 – 2017-10-24 (×4): 8 mg/h via INTRAVENOUS
  Filled 2017-10-22 (×8): qty 80

## 2017-10-22 MED ORDER — ONDANSETRON HCL 4 MG PO TABS: 4.0000 mg | ORAL_TABLET | Freq: Four times a day (QID) | ORAL | Status: DC | PRN

## 2017-10-22 MED ORDER — VITAMIN C 500 MG PO TABS
500.0000 mg | ORAL_TABLET | Freq: Every day | ORAL | Status: DC
  Administered 2017-10-23 – 2017-10-28 (×6): 500 mg via ORAL
  Filled 2017-10-22 (×7): qty 1

## 2017-10-22 MED ORDER — CO Q-10 200 MG PO CAPS: 1.0000 | ORAL_CAPSULE | Freq: Every day | ORAL | Status: DC

## 2017-10-22 MED ORDER — VITAMIN D3 25 MCG (1000 UNIT) PO TABS
1000.0000 [IU] | ORAL_TABLET | Freq: Every day | ORAL | Status: DC
  Administered 2017-10-22 – 2017-10-28 (×7): 1000 [IU] via ORAL
  Filled 2017-10-22 (×7): qty 1

## 2017-10-22 MED ORDER — CARVEDILOL 12.5 MG PO TABS
12.5000 mg | ORAL_TABLET | Freq: Two times a day (BID) | ORAL | Status: DC
  Administered 2017-10-22 – 2017-10-28 (×11): 12.5 mg via ORAL
  Filled 2017-10-22 (×13): qty 1

## 2017-10-22 MED ORDER — ADULT MULTIVITAMIN W/MINERALS CH
1.0000 | ORAL_TABLET | Freq: Every day | ORAL | Status: DC
  Administered 2017-10-22 – 2017-10-28 (×7): 1 via ORAL
  Filled 2017-10-22 (×7): qty 1

## 2017-10-22 MED ORDER — SODIUM CHLORIDE 0.9 % IV BOLUS
250.0000 mL | Freq: Once | INTRAVENOUS | Status: AC
  Administered 2017-10-22: 250 mL via INTRAVENOUS

## 2017-10-22 NOTE — Progress Notes (Signed)
PHARMACIST - PHYSICIAN ORDER COMMUNICATION  CONCERNING: P&T Medication Policy on Herbal Medications  DESCRIPTION:  This patient's order for:  CoEnzymeQ10  has been noted.  This product(s) is classified as an "herbal" or natural product. Due to a lack of definitive safety studies or FDA approval, nonstandard manufacturing practices, plus the potential risk of unknown drug-drug interactions while on inpatient medications, the Pharmacy and Therapeutics Committee does not permit the use of "herbal" or natural products of this type within [facility].   ACTION TAKEN: The pharmacy department is unable to verify this order at this time and your patient has been informed of this safety policy. Please reevaluate patient's clinical condition at discharge and address if the herbal or natural product(s) should be resumed at that time.  Netta Cedars, PharmD, BCPS 10/22/2017@6 :20 PM

## 2017-10-22 NOTE — ED Provider Notes (Addendum)
[facility] DEPT Provider Note   CSN: [id number] Arrival date & time: 10/22/17  1347     History   Chief Complaint Chief Complaint  Patient presents with  . Rectal Bleeding  . Dizziness    HPI Norman Rodriguez is a 62 y.o. male.  HPI   62 yo M with PMHx AFib, CAD, CHF, DVT on Xarelto, here with melena.  Patient states that for the last 3 weeks, he has had progressively worsening dark black, tarry stools.  He said intermittent maroon-colored stools.  Has had associated generalized weakness and lightheadedness upon standing.  He was taking indomethacin in addition to his Xarelto and says he stopped this, but he has continued to take meloxicam.  Denies any indigestion.  Denies any diarrhea.  Denies any fever or chills.  Symptoms do not seem to be any worse, but are persistent.  Denies any chest pain.  He does endorse progressive worsening dyspnea with exertion, though he does have history of CHF which "always" makes him tired.  No syncope.  Past Medical History:  Diagnosis Date  . Arthritis   . Atrial fibrillation ([place])    a. Dx 2010. b. Initiated on Tikosyn 03/2013.  [name] CAD (coronary artery disease)    a. BMS 2.2mm 23 to LAD. CTO of RCA unsuccessful, good left to right collaterals - 2010.  [name] Chronic anticoagulation   . Chronic systolic CHF (congestive heart failure) ([place])    a. Both ischemic and tachy induced. b. Echo 04/2013: EF 30-35%, no RWMA, normal RV, mod dilated LA (before restoration of NSR).  . COPD (chronic obstructive pulmonary disease) ([place]) 10/22/2017  . Dysrhythmia    hx of atrial fibrilation  . HTN (hypertension)   . Hyperlipidemia   . NSVT (nonsustained ventricular tachycardia) ([place])   . Obesity   . Shortness of breath     Patient Active Problem List   Diagnosis Date Noted  . Polyp of transverse colon   . Polyp of descending colon   . Polyp of rectum   . Polyp of cecum   . Melena   . BRBPR (bright red blood per rectum)  10/22/2017  . COPD (chronic obstructive pulmonary disease) ([place]) 10/22/2017  . Cubital tunnel syndrome, left 02/21/2017  . Dyspnea and respiratory abnormality 04/14/2016  . PAF (paroxysmal atrial fibrillation) ([place]) 05/08/2014  . Chest pain 05/14/2013  . Chronic systolic heart failure ([place]) 03/29/2013  . Cardiomyopathy ([place])   . CAD (coronary artery disease)   . Chronic anticoagulation   . NSVT (nonsustained ventricular tachycardia) ([place])   . Hyperlipidemia   . Obesity   . HTN (hypertension)   . Atrial fibrillation ([place]) 11/03/2012    Past Surgical History:  Procedure Laterality Date  . CARDIOVERSION N/A 04/27/2013   Procedure: BEDSIDE CARDIOVERSION;  Surgeon: Sanda Klein, MD;  Location: [facility];  Service: Cardiovascular;  Laterality: N/A;  . CARDIOVERSION N/A 05/15/2013   Procedure: CARDIOVERSION/BEDSIDE;  Surgeon: Larey Dresser, MD;  Location: [facility];  Service: Cardiovascular;  Laterality: N/A;  . CARDIOVERSION N/A 05/16/2014   Procedure: CARDIOVERSION;  Surgeon: Jerline Pain, MD;  Location: [facility];  Service: Cardiovascular;  Laterality: N/A;  . CARDIOVERSION N/A 06/07/2014   Procedure: CARDIOVERSION;  Surgeon: Jerline Pain, MD;  Location: [facility];  Service: Cardiovascular;  Laterality: N/A;  . COLONOSCOPY WITH PROPOFOL N/A 10/27/2017   Procedure: COLONOSCOPY WITH PROPOFOL;  Surgeon: Mauri Pole, MD;  Location: [facility];  Service: Endoscopy;  Laterality: N/A;  .  CORONARY ANGIOPLASTY WITH STENT PLACEMENT  2010   BMS 2.15mm 23 to LAD. CTO of RCA unsuccessful, good left to right collaterals  . ESOPHAGOGASTRODUODENOSCOPY (EGD) WITH PROPOFOL N/A 10/24/2017   Procedure: ESOPHAGOGASTRODUODENOSCOPY (EGD) WITH PROPOFOL;  Surgeon: Mauri Pole, MD;  Location: [facility];  Service: Endoscopy;  Laterality: N/A;  . FOOT SURGERY Right   . POLYPECTOMY  10/27/2017   Procedure: POLYPECTOMY;  Surgeon: Mauri Pole, MD;  Location: [facility];  Service:  Endoscopy;;        Home Medications    Prior to Admission medications   Medication Sig Start Date End Date Taking? Authorizing Provider  ANORO ELLIPTA 62.5-25 MCG/INH AEPB INHALE ONE PUFFS INTO THE LUNGS DAILY Patient taking differently: Inhale 1 puff into the lungs daily.  09/30/17  Yes Lauraine Rinne, NP  atorvastatin (LIPITOR) 40 MG tablet TAKE 1 TABLET (40 MG TOTAL) BY MOUTH DAILY. Patient taking differently: Take 40 mg by mouth daily at 6 PM.  03/12/15  Yes Skains, Thana Farr, MD  B Complex-C (B-COMPLEX WITH VITAMIN C) tablet Take 1 tablet by mouth daily.   Yes [provider]  carvedilol (COREG) 12.5 MG tablet TAKE 1 TABLET (12.5 MG TOTAL) BY MOUTH TWO TIMES DAILY WITH A MEAL. Patient taking differently: Take 12.5 mg by mouth 2 (two) times daily with a meal. TAKE 1 TABLET (12.5 MG TOTAL) BY MOUTH TWO TIMES DAILY WITH A MEAL. 07/30/16  Yes Jerline Pain, MD  Coenzyme Q10 (CO Q-10) 200 MG CAPS Take 1 capsule by mouth daily.   Yes [provider]  fexofenadine (ALLEGRA) 180 MG tablet Take 180 mg by mouth daily.   Yes [provider]  gabapentin (NEURONTIN) 300 MG capsule Take 600 mg by mouth as needed (carpel tunnel).    Yes [provider]  LevOCARNitine (CARNITINE PO) Take 500 mg by mouth daily.   Yes [provider]  Magnesium 400 MG CAPS Take 1 capsule by mouth daily.    Yes [provider]  meloxicam (MOBIC) 15 MG tablet Take 15 mg by mouth daily.   Yes [provider]  Multiple Vitamin (MULTIVITAMIN) tablet Take 1 tablet by mouth daily.   Yes [provider]  nitroGLYCERIN (NITROSTAT) 0.4 MG SL tablet Place 0.4 mg under the tongue every 5 (five) minutes as needed for chest pain.   Yes [provider]  NON FORMULARY Take 1 capsule by mouth daily. (Ribose) Take 1 capsule once a day    Yes [provider]  NON FORMULARY Take 1 tablet by mouth daily. Glycine viatmin 1 tab po qd   Yes [provider]  NON FORMULARY Take 465 mg by mouth as needed (joint pain). [place]    Yes [provider]  Omega-3 Fatty Acids (OMEGA 3 PO) Take 2 capsules by mouth daily.    Yes [provider]  potassium chloride SA (K-DUR,KLOR-CON) 20 MEQ tablet TAKE 1 TABLET (20 MEQ TOTAL) BY MOUTH DAILY. 06/24/17  Yes Jerline Pain, MD  sacubitril-valsartan (ENTRESTO) 49-51 MG Take 1 tablet by mouth 2 (two) times daily. 10/17/17  Yes Jerline Pain, MD  sildenafil (VIAGRA) 100 MG tablet Take 1 tablet (100 mg total) by mouth daily as needed for erectile dysfunction. 04/14/17  Yes Jerline Pain, MD  vitamin C (ASCORBIC ACID) 500 MG tablet Take 500 mg by mouth daily.   Yes [provider]  VITAMIN D, CHOLECALCIFEROL, PO Take 2 tablets by mouth daily.  Yes [provider]  furosemide (LASIX) 40 MG tablet Take 0.5-1.5 tablets (20-60 mg total) by mouth daily. Take an additional 40 mg by mouth as needed for fluid weight gain. 10/28/17   Doreatha Lew, MD  pantoprazole (PROTONIX) 40 MG tablet Take 1 tablet (40 mg total) by mouth daily at 6 (six) AM. 10/29/17   Patrecia Pour, Christean Grief, MD  rivaroxaban (XARELTO) 20 MG TABS tablet Take 1 tablet (20 mg total) by mouth daily with supper. 10/31/17   Doreatha Lew, MD    Family History Family History  Problem Relation Age of Onset  . Other Father        Car accident  . Coronary artery disease Neg Hx     Social History Social History   Tobacco Use  . Smoking status: Former Smoker    Types: Cigars  . Smokeless tobacco: Never Used  . Tobacco comment: 1 cigar per month states he stopped smoking  Substance Use Topics  . Alcohol use: No  . Drug use: No    Comment: 1 per month...pt states he stopped smoking     Allergies   Apple; Soybean oil; Lisinopril; Other; Bean pod extract; Carrot [daucus carota]; Grass extracts [gramineae pollens]; Peach flavor; and Tree extract   Review of Systems Review of Systems    Constitutional: Positive for fatigue. Negative for chills and fever.  HENT: Negative for congestion and rhinorrhea.   Eyes: Negative for visual disturbance.  Respiratory: Negative for cough, shortness of breath and wheezing.   Cardiovascular: Negative for chest pain and leg swelling.  Gastrointestinal: Positive for blood in stool. Negative for abdominal pain, diarrhea, nausea and vomiting.  Genitourinary: Negative for dysuria and flank pain.  Musculoskeletal: Negative for neck pain and neck stiffness.  Skin: Negative for rash and wound.  Allergic/Immunologic: Negative for immunocompromised state.  Neurological: Positive for weakness and light-headedness. Negative for syncope and headaches.  All other systems reviewed and are negative.    Physical Exam Updated Vital Signs BP 102/70 (BP Location: Left Arm)   Pulse 86   Temp 98 F (36.7 C) (Oral)   Resp 16   Ht 5\' 7"  (1.702 m)   Wt 118.8 kg   SpO2 95%   BMI 41.02 kg/m   Physical Exam  Constitutional: He is oriented to person, place, and time. He appears well-developed and well-nourished. No distress.  HENT:  Head: Normocephalic and atraumatic.  Eyes: Conjunctivae are normal.  Neck: Neck supple.  Cardiovascular: Normal rate, regular rhythm and normal heart sounds. Exam reveals no friction rub.  No murmur heard. Pulmonary/Chest: Effort normal and breath sounds normal. No respiratory distress. He has no wheezes. He has no rales.  Abdominal: He exhibits no distension.  Genitourinary:  Genitourinary Comments: Melena noted.  Nonthrombosed external hemorrhoid.  Stool black, tarry, with some maroon-colored areas.  No bright red blood.  Musculoskeletal: He exhibits no edema.  Neurological: He is alert and oriented to person, place, and time. He exhibits normal muscle tone.  Skin: Skin is warm. Capillary refill takes less than 2 seconds.  Psychiatric: He has a normal mood and affect.  Nursing note and vitals reviewed.    ED  Treatments / Results  Labs (all labs ordered are listed, but only abnormal results are displayed) Labs Reviewed  COMPREHENSIVE METABOLIC PANEL - Abnormal; Notable for the following components:      Result Value   Glucose, Bld 123 (*)    Total Bilirubin 1.3 (*)    All other components  within normal limits  PROTIME-INR - Abnormal; Notable for the following components:   Prothrombin Time 15.7 (*)    All other components within normal limits  BASIC METABOLIC PANEL - Abnormal; Notable for the following components:   Glucose, Bld 107 (*)    Calcium 8.8 (*)    All other components within normal limits  BASIC METABOLIC PANEL - Abnormal; Notable for the following components:   Glucose, Bld 120 (*)    BUN 7 (*)    Calcium 8.8 (*)    All other components within normal limits  POC OCCULT BLOOD, ED - Abnormal; Notable for the following components:   Fecal Occult Bld POSITIVE (*)    All other components within normal limits  CBC  HIV ANTIBODY (ROUTINE TESTING W REFLEX)  HEMATOCRIT  HEMATOCRIT  CBC  HEMATOCRIT  CBC  CBC WITH DIFFERENTIAL/PLATELET  CBC  TYPE AND SCREEN  SURGICAL PATHOLOGY    EKG None  Radiology No results found.  Procedures .Critical Care Performed by: Duffy Bruce, MD Authorized by: Duffy Bruce, MD   Critical care provider statement:    Critical care time (minutes):  35   Critical care time was exclusive of:  Separately billable procedures and treating other patients and teaching time   Critical care was necessary to treat or prevent imminent or life-threatening deterioration of the following conditions:  Circulatory failure and cardiac failure   Critical care was time spent personally by me on the following activities:  Development of treatment plan with patient or surrogate, discussions with consultants, evaluation of patient's response to treatment, examination of patient, obtaining history from patient or surrogate, ordering and performing treatments  and interventions, ordering and review of laboratory studies, ordering and review of radiographic studies, pulse oximetry, re-evaluation of patient's condition and review of old charts   I assumed direction of critical care for this patient from another provider in my specialty: no     (including critical care time)  Medications Ordered in ED Medications  0.9 %  sodium chloride infusion ( Intravenous Stopped 10/23/17 2031)  pantoprazole (PROTONIX) 80 mg in sodium chloride 0.9 % 100 mL IVPB (0 mg Intravenous Stopped 10/22/17 1655)  sodium chloride 0.9 % bolus 250 mL (250 mLs Intravenous New Bag/Given 10/22/17 1625)  Influenza vac split quadrivalent PF (FLUARIX) injection 0.5 mL (0.5 mLs Intramuscular Given 10/23/17 0909)  polyethylene glycol powder (GLYCOLAX/MIRALAX) container 255 g (255 g Oral Given 10/26/17 1807)     Initial Impression / Assessment and Plan / ED Course  I have reviewed the triage vital signs and the nursing notes.  Pertinent labs & imaging results that were available during my care of the patient were reviewed by me and considered in my medical decision making (see chart for details).     62 year old male here with rectal bleeding and dizziness upon standing.  Hemoglobin stable here but patient has significant melena on exam.  Suspect this could be due to upper GI source given his indomethacin use and Xarelto use.  Will start on IV PPI drip for active UGIB, consult GI, and admit to medicine.  Final Clinical Impressions(s) / ED Diagnoses   Final diagnoses:  Melena  UGIB (upper gastrointestinal bleed)    ED Discharge Orders         Ordered    rivaroxaban (XARELTO) 20 MG TABS tablet  Daily with supper     10/28/17 1217    pantoprazole (PROTONIX) 40 MG tablet  Daily     10/28/17 1217  furosemide (LASIX) 40 MG tablet  Daily     10/28/17 1217    Increase activity slowly     10/28/17 1217    Diet - low sodium heart healthy     10/28/17 1217    Call MD for:   temperature >100.4     10/28/17 1217    Call MD for:  persistant nausea and vomiting     10/28/17 1217    Call MD for:  severe uncontrolled pain     10/28/17 1217    Call MD for:  redness, tenderness, or signs of infection (pain, swelling, redness, odor or green/yellow discharge around incision site)     10/28/17 1217    Call MD for:  difficulty breathing, headache or visual disturbances     10/28/17 1217    Call MD for:  hives     10/28/17 1217    Call MD for:  persistant dizziness or light-headedness     10/28/17 1217    Call MD for:  extreme fatigue     10/28/17 1217    (HEART FAILURE PATIENTS) Call MD:  Anytime you have any of the following symptoms: 1) 3 pound weight gain in 24 hours or 5 pounds in 1 week 2) shortness of breath, with or without a dry hacking cough 3) swelling in the hands, feet or stomach 4) if you have to sleep on extra pillows at night in order to breathe.     10/28/17 1217           Duffy Bruce, MD 10/22/17 1607    Duffy Bruce, MD 11/03/17 [id number]

## 2017-10-22 NOTE — ED Notes (Signed)
Report given to [place], [place]. [phone]. RN made aware patient is a hard to stick. Used ultrasound IV for first stick.

## 2017-10-22 NOTE — H&P (Signed)
History and Physical    Norman Rodriguez NGE:[id number] DOB: 1955/09/11 DOA: 10/22/2017  PCP: [facility], [facility]   Patient coming from: home    Chief Complaint: melena  HPI: Norman Rodriguez is a 62 y.o. male with medical history significant of coronary artery disease status post bare-metal stent to LAD and occluded RCA with left-to-right collaterals, paroxysmal atrial fibrillation status post multiple cardioversions on rivaroxaban, DVT on rivaroxaban, chronic systolic heart failure with EF of 30 to 35% by echo on 03/21/2017, hypertension, hyperlipidemia, NSVT, restrictive lung disease/COPD who comes in with generalized weakness and orthopnea as well as black tarry stools.  Patient reports that over the past 3 weeks he has been sort of diffusely feeling weak.  He does not have any focal deficits.  He has not noted any chest pain, palpitations, exercise intolerance, shortness of breath, dyspnea on exertion, cough, congestion, rhinorrhea.  He has not had any orthopnea or paroxysmal nocturnal dyspnea.  For the past several days he began to have dark and tarry stools mixed in with his regular stool color.  He has not had any nausea, hematemesis, abdominal pain.  He does notice that perhaps his abdomen is slightly more puffy.   ED Course: In the ED vitals were stable.  Labs were unremarkable.  GI was consulted and recommended inpatient admission.  Patient was started on PPI drip.  Review of Systems: As per HPI otherwise 10 point review of systems negative.  1  Past Medical History:  Diagnosis Date  . Arthritis   . Atrial fibrillation ([place])    a. Dx 2010. b. Initiated on Tikosyn 03/2013.  [name] CAD (coronary artery disease)    a. BMS 2.49mm 23 to LAD. CTO of RCA unsuccessful, good left to right collaterals - 2010.  [name] Chronic anticoagulation   . Chronic systolic CHF (congestive heart failure) ([place])    a. Both ischemic and tachy induced. b. Echo 04/2013: EF 30-35%, no RWMA, normal  RV, mod dilated LA (before restoration of NSR).  . COPD (chronic obstructive pulmonary disease) ([place]) 10/22/2017  . Dysrhythmia    hx of atrial fibrilation  . HTN (hypertension)   . Hyperlipidemia   . NSVT (nonsustained ventricular tachycardia) ([place])   . Obesity   . Shortness of breath     Past Surgical History:  Procedure Laterality Date  . CARDIOVERSION N/A 04/27/2013   Procedure: BEDSIDE CARDIOVERSION;  Surgeon: Sanda Klein, MD;  Location: [place];  Service: Cardiovascular;  Laterality: N/A;  . CARDIOVERSION N/A 05/15/2013   Procedure: CARDIOVERSION/BEDSIDE;  Surgeon: Larey Dresser, MD;  Location: [place];  Service: Cardiovascular;  Laterality: N/A;  . CARDIOVERSION N/A 05/16/2014   Procedure: CARDIOVERSION;  Surgeon: Jerline Pain, MD;  Location: [place];  Service: Cardiovascular;  Laterality: N/A;  . CARDIOVERSION N/A 06/07/2014   Procedure: CARDIOVERSION;  Surgeon: Jerline Pain, MD;  Location: [place];  Service: Cardiovascular;  Laterality: N/A;  . CORONARY ANGIOPLASTY WITH STENT PLACEMENT  2010   BMS 2.78mm 23 to LAD. CTO of RCA unsuccessful, good left to right collaterals  . FOOT SURGERY Right      reports that he has quit smoking. His smoking use included cigars. He has never used smokeless tobacco. He reports that he does not drink alcohol or use drugs.  Allergies  Allergen Reactions  . Apple Hives  . Soybean Oil Other (See Comments)    unknown  . Lisinopril Cough  . Other Itching    Pt has itching and  joint swelling with walnuts, mildew, fungi, basil and peaches  . Bean Pod Extract Rash    Green Beans specifically Joint swelling and itching  . Carrot [Daucus Carota] Rash    Joint swelling and itching  . Grass Extracts [Gramineae Pollens] Rash    Joint swelling and itching  . Peach Flavor Rash    Joint swelling and itching  . Tree Extract Rash    Joint swelling and itching    Family History  Problem Relation Age of Onset  . Other Father        Car  accident  . Coronary artery disease Neg Hx      Prior to Admission medications   Medication Sig Start Date End Date Taking? Authorizing Provider  ANORO ELLIPTA 62.5-25 MCG/INH AEPB INHALE ONE PUFFS INTO THE LUNGS DAILY Patient taking differently: Inhale 1 puff into the lungs daily.  09/30/17  Yes Lauraine Rinne, NP  atorvastatin (LIPITOR) 40 MG tablet TAKE 1 TABLET (40 MG TOTAL) BY MOUTH DAILY. Patient taking differently: Take 40 mg by mouth daily at 6 PM.  03/12/15  Yes Skains, Thana Farr, MD  B Complex-C (B-COMPLEX WITH VITAMIN C) tablet Take 1 tablet by mouth daily.   Yes [provider]  carvedilol (COREG) 12.5 MG tablet TAKE 1 TABLET (12.5 MG TOTAL) BY MOUTH TWO TIMES DAILY WITH A MEAL. Patient taking differently: Take 12.5 mg by mouth 2 (two) times daily with a meal. TAKE 1 TABLET (12.5 MG TOTAL) BY MOUTH TWO TIMES DAILY WITH A MEAL. 07/30/16  Yes Jerline Pain, MD  Coenzyme Q10 (CO Q-10) 200 MG CAPS Take 1 capsule by mouth daily.   Yes [provider]  fexofenadine (ALLEGRA) 180 MG tablet Take 180 mg by mouth daily.   Yes [provider]  furosemide (LASIX) 40 MG tablet Take 0.5 tablets (20 mg total) by mouth daily. Take an additional 40 mg by mouth as needed for fluid weight gain. Patient taking differently: Take 20-60 mg by mouth daily. Take an additional 40 mg by mouth as needed for fluid weight gain. 08/01/17  Yes Jerline Pain, MD  gabapentin (NEURONTIN) 300 MG capsule Take 600 mg by mouth as needed (carpel tunnel).    Yes [provider]  LevOCARNitine (CARNITINE PO) Take 500 mg by mouth daily.   Yes [provider]  Magnesium 400 MG CAPS Take 1 capsule by mouth daily.    Yes [provider]  meloxicam (MOBIC) 15 MG tablet Take 15 mg by mouth daily.   Yes [provider]  Multiple Vitamin (MULTIVITAMIN) tablet Take 1 tablet by mouth daily.   Yes [provider]  nitroGLYCERIN (NITROSTAT) 0.4 MG SL tablet Place 0.4 mg  under the tongue every 5 (five) minutes as needed for chest pain.   Yes [provider]  NON FORMULARY Take 1 capsule by mouth daily. (Ribose) Take 1 capsule once a day    Yes [provider]  NON FORMULARY Take 1 tablet by mouth daily. Glycine viatmin 1 tab po qd   Yes [provider]  NON FORMULARY Take 465 mg by mouth as needed (joint pain). [place]    Yes [provider]  Omega-3 Fatty Acids (OMEGA 3 PO) Take 2 capsules by mouth daily.    Yes [provider]  potassium chloride SA (K-DUR,KLOR-CON) 20 MEQ tablet TAKE 1 TABLET (20 MEQ TOTAL) BY MOUTH DAILY. 06/24/17  Yes Jerline Pain, MD  sacubitril-valsartan (ENTRESTO) 49-51 MG  Take 1 tablet by mouth 2 (two) times daily. 10/17/17  Yes Jerline Pain, MD  sildenafil (VIAGRA) 100 MG tablet Take 1 tablet (100 mg total) by mouth daily as needed for erectile dysfunction. 04/14/17  Yes Jerline Pain, MD  vitamin C (ASCORBIC ACID) 500 MG tablet Take 500 mg by mouth daily.   Yes [provider]  VITAMIN D, CHOLECALCIFEROL, PO Take 2 tablets by mouth daily.   Yes [provider]  XARELTO 20 MG TABS tablet TAKE 1 TABLET (20 MG TOTAL) BY MOUTH DAILY WITH SUPPER. Patient taking differently: Take 20 mg by mouth daily with supper.  03/09/17  Yes Jerline Pain, MD    Physical Exam: Vitals:   10/22/17 1418 10/22/17 1447 10/22/17 1521 10/22/17 1615  BP: 103/80 103/80 108/74 (!) 128/96  Pulse: 83 93 92 88  Resp: 18 18 18    Temp:      TempSrc:      SpO2: 97% 93% 96% 94%  Weight:      Height:        Constitutional: NAD, calm, comfortable Vitals:   10/22/17 1418 10/22/17 1447 10/22/17 1521 10/22/17 1615  BP: 103/80 103/80 108/74 (!) 128/96  Pulse: 83 93 92 88  Resp: 18 18 18    Temp:      TempSrc:      SpO2: 97% 93% 96% 94%  Weight:      Height:       Eyes: Anicteric sclera ENMT: Dry mucous membranes, poor dentition.  Neck: Pickwickian neck Respiratory: clear to  auscultation bilaterally, no wheezing, no crackles. Normal respiratory effort. No accessory muscle use.  Cardiovascular: Irregularly irregular, normal murmurs.  Abdomen: no tenderness, no masses palpated. No hepatosplenomegaly. Bowel sounds positive.  Musculoskeletal: Trace lower extremity edema.  Skin: Large well-healed incision over middle of abdomen Neurologic: Grossly intact, moving all extremities Psychiatric: Normal judgment and insight. Alert and oriented x 3. Normal mood.     Labs on Admission: I have personally reviewed following labs and imaging studies  CBC: Recent Labs  Lab 10/22/17 1358  WBC 7.0  HGB 15.4  HCT 44.8  MCV 96.6  PLT 956   Basic Metabolic Panel: Recent Labs  Lab 10/22/17 1358  NA 139  K 4.5  CL 105  CO2 26  GLUCOSE 123*  BUN 16  CREATININE 1.00  CALCIUM 9.2   GFR: Estimated Creatinine Clearance: 93.3 mL/min (by C-G formula based on SCr of 1 mg/dL). Liver Function Tests: Recent Labs  Lab 10/22/17 1358  AST 30  ALT 35  ALKPHOS 41  BILITOT 1.3*  PROT 7.0  ALBUMIN 3.9   No results for input(s): LIPASE, AMYLASE in the last 168 hours. No results for input(s): AMMONIA in the last 168 hours. Coagulation Profile: Recent Labs  Lab 10/22/17 1502  INR 1.26   Cardiac Enzymes: No results for input(s): CKTOTAL, CKMB, CKMBINDEX, TROPONINI in the last 168 hours. BNP (last 3 results) Recent Labs    02/28/17 1621  PROBNP 2,250*   HbA1C: No results for input(s): HGBA1C in the last 72 hours. CBG: No results for input(s): GLUCAP in the last 168 hours. Lipid Profile: No results for input(s): CHOL, HDL, LDLCALC, TRIG, CHOLHDL, LDLDIRECT in the last 72 hours. Thyroid Function Tests: No results for input(s): TSH, T4TOTAL, FREET4, T3FREE, THYROIDAB in the last 72 hours. Anemia Panel: No results for input(s): VITAMINB12, FOLATE, FERRITIN, TIBC, IRON, RETICCTPCT in the last 72 hours. Urine analysis:    Component Value Date/Time   COLORURINE  YELLOW 11/01/2008 [place] 11/01/2008 1522   LABSPEC 1.016 11/01/2008 1522   PHURINE 7.5 11/01/2008 1522   GLUCOSEU 250 (A) 11/01/2008 1522   HGBUR MODERATE (A) 11/01/2008 1522   BILIRUBINUR NEGATIVE 11/01/2008 1522   [place] 11/01/2008 1522   PROTEINUR 100 (A) 11/01/2008 1522   UROBILINOGEN 0.2 11/01/2008 1522   NITRITE NEGATIVE 11/01/2008 1522   LEUKOCYTESUR SMALL (A) 11/01/2008 1522    Radiological Exams on Admission: No results found.  EKG: Independently reviewed.  None performed  Assessment/Plan Active Problems:   Atrial fibrillation (HCC)   CAD (coronary artery disease)   Chronic anticoagulation   NSVT (nonsustained ventricular tachycardia) (H[facility])   Hyperlipidemia   Obesity   HTN (hypertension)   Chronic systolic heart failure (H[facility])   PAF (paroxysmal atrial fibrillation) (H[facility])   Dyspnea and respiratory abnormality   GI bleeding   COPD (chronic obstructive pulmonary disease) ([place])   #) Orthopnea/melena: This time it is not clear patient symptoms are related to his melena.  He does appear to be volume depleted.  He is not particularly anemic though likely is not equilibrated.  He does report using indomethacin suggesting that there could be a upper GI source of his melena. -Dr. Collene Mares from GI has been consulted that she saw him as an outpatient - Continue PPI drip -N.p.o. at midnight -Hold anticoagulation at this time -Every 6 hours H&H for 1 day  #) Paroxysmal atrial fibrillation: - Hold rivaroxaban -Continue carvedilol 12.5 mg twice daily  #) Chronic systolic heart failure: Patient appears to be mildly volume depleted at this time. - Hold sacubitril/valsartan 49-51 twice daily - Continue beta-blocker per above - Hold furosemide 20 mg daily and PRN  #) DVT: Patient reports a history of DVT that was relatively remote several years ago and so will not need bridging with heparin. -Hold rivaroxaban per above  #) Coronary artery  disease/hypertension/hyperlipidemia: - Hold rivaroxaban per above - Continue beta-blocker -Continue atorvastatin 40 mg daily -Hold ARB/neprislyn antagonist  #) COPD/restrictive lung disease: -Continue LABA/ICS  Fluids: Gentle IV fluids Electrolyte: Monitor and supplement  Nutrition: Heart healthy diet, n.p.o. pending   prophylaxis: SCDs  Disposition: Pending evaluation by GI  Full code  Cristy Folks MD [facility]  If 7PM-7AM, please contact night-coverage [email or web address] Password [facility]  10/22/2017, 4:18 PM

## 2017-10-22 NOTE — ED Notes (Signed)
Called Lab to add INR. Lab confirmed they will add.

## 2017-10-22 NOTE — ED Notes (Signed)
ED TO INPATIENT HANDOFF REPORT  Name/Age/Gender Norman Rodriguez 62 y.o. male  Code Status Code Status History    Date Active Date Inactive Code Status Order ID Comments User Context   05/14/2013 1253 05/15/2013 1705 Full Code [id number]  [name], MD Inpatient   04/24/2013 1551 04/28/2013 1558 Full Code [id number]  [name], MD Inpatient      Home/SNF/Other Home  Chief Complaint rectal bleeding  Level of Care/Admitting Diagnosis ED Disposition    ED Disposition Condition [facility] Area: [facility] [[id number]]  Level of Care: Med-Surg [16]  Diagnosis: GI bleeding [[id number]]  Admitting Physician: Cristy Folks [[id number]]  Attending Physician: Cristy Folks [[id number]]  Estimated length of stay: past midnight tomorrow  Certification:: I certify this patient will need inpatient services for at least 2 midnights  PT Class (Do Not Modify): Inpatient [101]  PT Acc Code (Do Not Modify): Private [1]       Medical History Past Medical History:  Diagnosis Date  . Arthritis   . Atrial fibrillation ([place])    a. Dx 2010. b. Initiated on Tikosyn 03/2013.  [name] CAD (coronary artery disease)    a. BMS 2.85m 23 to LAD. CTO of RCA unsuccessful, good left to right collaterals - 2010.  .Marland KitchenChronic anticoagulation   . Chronic systolic CHF (congestive heart failure) ([place]    a. Both ischemic and tachy induced. b. Echo 04/2013: EF 30-35%, no RWMA, normal RV, mod dilated LA (before restoration of NSR).  . COPD (chronic obstructive pulmonary disease) ([place] 10/22/2017  . Dysrhythmia    hx of atrial fibrilation  . HTN (hypertension)   . Hyperlipidemia   . NSVT (nonsustained ventricular tachycardia) ([place]   . Obesity   . Shortness of breath     Allergies Allergies  Allergen Reactions  . Apple Hives  . Soybean Oil Other (See Comments)    unknown  . Lisinopril Cough  . Other Itching    Pt has itching and joint swelling with walnuts, mildew, fungi,  basil and peaches  . Bean Pod Extract Rash    Green Beans specifically Joint swelling and itching  . Carrot [Daucus Carota] Rash    Joint swelling and itching  . Grass Extracts [Gramineae Pollens] Rash    Joint swelling and itching  . Peach Flavor Rash    Joint swelling and itching  . Tree Extract Rash    Joint swelling and itching    IV Location/Drains/Wounds Patient Lines/Drains/Airways Status   Active Line/Drains/Airways    Name:   Placement date:   Placement time:   Site:   Days:   Peripheral IV 10/22/17 Right Forearm   10/22/17    1445    Forearm   less than 1          Labs/Imaging Results for orders placed or performed during the hospital encounter of 10/22/17 (from the past 48 hour(s))  Comprehensive metabolic panel     Status: Abnormal   Collection Time: 10/22/17  1:58 PM  Result Value Ref Range   Sodium 139 135 - 145 mmol/L   Potassium 4.5 3.5 - 5.1 mmol/L   Chloride 105 98 - 111 mmol/L   CO2 26 22 - 32 mmol/L   Glucose, Bld 123 (H) 70 - 99 mg/dL   BUN 16 8 - 23 mg/dL   Creatinine, Ser 1.00 0.61 - 1.24 mg/dL   Calcium 9.2 8.9 - 10.3 mg/dL   Total Protein 7.0 6.5 -  8.1 g/dL   Albumin 3.9 3.5 - 5.0 g/dL   AST 30 15 - 41 U/L   ALT 35 0 - 44 U/L   Alkaline Phosphatase 41 38 - 126 U/L   Total Bilirubin 1.3 (H) 0.3 - 1.2 mg/dL   GFR calc non Af Amer >60 >60 mL/min   GFR calc Af Amer >60 >60 mL/min    Comment: (NOTE) The eGFR has been calculated using the CKD EPI equation. This calculation has not been validated in all clinical situations. eGFR's persistently <60 mL/min signify possible Chronic Kidney Disease.    Anion gap 8 5 - 15    Comment: Performed at [facility], [place] [address] [place], [place] [address]  CBC     Status: None   Collection Time: 10/22/17  1:58 PM  Result Value Ref Range   WBC 7.0 4.0 - 10.5 K/uL   RBC 4.64 4.22 - 5.81 MIL/uL   Hemoglobin 15.4 13.0 - 17.0 g/dL   HCT 44.8 39.0 - 52.0 %   MCV 96.6 78.0 - 100.0 fL    MCH 33.2 26.0 - 34.0 pg   MCHC 34.4 30.0 - 36.0 g/dL   RDW 14.3 11.5 - 15.5 %   Platelets 198 150 - 400 K/uL    Comment: Performed at [facility], [place] [address] [place], [place] [address]  Type and screen [place]     Status: None   Collection Time: 10/22/17  2:30 PM  Result Value Ref Range   ABO/RH(D) A POS    Antibody Screen NEG    Sample Expiration      10/25/2017 Performed at [facility] [place] [address], [place], [place] [address]   Protime-INR     Status: Abnormal   Collection Time: 10/22/17  3:02 PM  Result Value Ref Range   Prothrombin Time 15.7 (H) 11.4 - 15.2 seconds   INR 1.26     Comment: Performed at [facility], [place] [address], [place], [place] [address]  POC occult blood, ED     Status: Abnormal   Collection Time: 10/22/17  3:38 PM  Result Value Ref Range   Fecal Occult Bld POSITIVE (A) NEGATIVE   No results found.  Pending Labs [employer] (From admission, onward)    Start     Ordered   Signed and Held  HIV antibody (Routine Testing)  Once,   R     Signed and Held   Signed and Held  Hematocrit  Now then every 6 hours,   R     Signed and Held   Visual merchandiser and Held  Basic metabolic panel  Tomorrow morning,   R     Signed and Held   Signed and Held  CBC  Tomorrow morning,   R     Signed and Held          Vitals/Pain Today's Vitals   10/22/17 1447 10/22/17 1521 10/22/17 1615 10/22/17 1618  BP: 103/80 108/74 (!) 128/96 (!) 128/96  Pulse: 93 92 88 81  Resp: 18 18 (!) 22 16  Temp:      TempSrc:      SpO2: 93% 96% 96% 95%  Weight:      Height:      PainSc:        Isolation Precautions No active isolations  Medications Medications  pantoprazole (PROTONIX) 80 mg in sodium chloride 0.9 % 250 mL (0.32 mg/mL) infusion (8 mg/hr Intravenous  New Bag/Given 10/22/17 1655)  sodium chloride 0.9 % bolus 250 mL (250 mLs Intravenous New Bag/Given 10/22/17 1625)  pantoprazole  (PROTONIX) 80 mg in sodium chloride 0.9 % 100 mL IVPB (0 mg Intravenous Stopped 10/22/17 1655)    Mobility walks with person assist
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
## 2017-10-22 NOTE — ED Triage Notes (Signed)
Patient arrives with c/o rectal bleeding, black tarry stool. Patient has hx of taking indomethacin. Patient on Xarelto for afib, DVT, and stent placement in heart. Patient reports abdominal pain yesterday but denies any currently. +Dizzy, +weakness. -N/V.

## 2017-10-22 NOTE — ED Notes (Signed)
Attempted to call report. RN not available at this time.  Will call back in 5 min.

## 2017-10-23 ENCOUNTER — Encounter (HOSPITAL_COMMUNITY): Payer: Self-pay | Admitting: Emergency Medicine

## 2017-10-23 DIAGNOSIS — I10 Essential (primary) hypertension: Secondary | ICD-10-CM

## 2017-10-23 LAB — CBC
HCT: 43.7 % (ref 39.0–52.0)
Hemoglobin: 14.7 g/dL (ref 13.0–17.0)
MCH: 33 pg (ref 26.0–34.0)
MCHC: 33.6 g/dL (ref 30.0–36.0)
MCV: 98 fL (ref 78.0–100.0)
Platelets: 184 10*3/uL (ref 150–400)
RBC: 4.46 MIL/uL (ref 4.22–5.81)
RDW: 14.4 % (ref 11.5–15.5)
WBC: 5 10*3/uL (ref 4.0–10.5)

## 2017-10-23 LAB — BASIC METABOLIC PANEL
Anion gap: 8 (ref 5–15)
Calcium: 8.8 mg/dL — ABNORMAL LOW (ref 8.9–10.3)
Creatinine, Ser: 1.03 mg/dL (ref 0.61–1.24)
GFR calc Af Amer: >60 mL/min (ref >60)
GFR calc non Af Amer: >60 mL/min (ref >60)
Potassium: 3.9 mmol/L (ref 3.5–5.1)
Sodium: 142 mmol/L (ref 135–145)

## 2017-10-23 LAB — BASIC METABOLIC PANEL WITH GFR
BUN: 13 mg/dL (ref 8–23)
CO2: 28 mmol/L (ref 22–32)
Chloride: 106 mmol/L (ref 98–111)
Glucose, Bld: 107 mg/dL — ABNORMAL HIGH (ref 70–99)

## 2017-10-23 LAB — HEMATOCRIT
HCT: 43.4 % (ref 39.0–52.0)
HCT: 44.6 % (ref 39.0–52.0)

## 2017-10-23 LAB — HIV ANTIBODY (ROUTINE TESTING W REFLEX): HIV Screen 4th Generation wRfx: NONREACTIVE

## 2017-10-23 MED ORDER — SODIUM CHLORIDE 0.9 % IV SOLN
INTRAVENOUS | Status: DC
  Administered 2017-10-23: 21:00:00 via INTRAVENOUS

## 2017-10-23 MED ORDER — SODIUM CHLORIDE 0.9 % IV SOLN: INTRAVENOUS | Status: DC

## 2017-10-23 NOTE — Progress Notes (Signed)
PROGRESS NOTE    Norman Rodriguez  KVQ:[id number] DOB: March 01, 1955 DOA: 10/22/2017 PCP: Medicine, [facility]    Brief Narrative:  Norman Rodriguez is a 62 y.o. male with medical history significant of coronary artery disease status post bare-metal stent to LAD and occluded RCA with left-to-right collaterals, paroxysmal atrial fibrillation status post multiple cardioversions on rivaroxaban, DVT on rivaroxaban, chronic systolic heart failure with EF of 30 to 35% by echo on 03/21/2017, hypertension, hyperlipidemia, NSVT, restrictive lung disease/COPD who comes in with generalized weakness and orthopnea as well as black tarry stools. He was admitted for GI bleed.  Assessment & Plan:   Active Problems:   Atrial fibrillation (HCC)   CAD (coronary artery disease)   Chronic anticoagulation   NSVT (nonsustained ventricular tachycardia) (HCC)   Hyperlipidemia   Obesity   HTN (hypertension)   Chronic systolic heart failure (HCC)   PAF (paroxysmal atrial fibrillation) (HCC)   Dyspnea and respiratory abnormality   GI bleeding   COPD (chronic obstructive pulmonary disease) (HCC)   [place]:  Suspect upper GI bleed.  H&H appear to remain stable.  GI consulted and plan for EGD in am.  NPO after midnight.  Resume IV PPI. No more episodes of malena after admission.  Holding all anti coagulation at this time.     Paroxysmal atrial fibrillation:  Rate controlled with coreg and holding xarelto for now.    Chronic systolic heart failure:  He appears euvolemic.  Lasix as needed.   Hypertension:  Well controlled.    COPD: NO wheezing heard on exam.      DVT prophylaxis: scd's Code Status: full code.  Family Communication: none at bedside.  Disposition Plan: pending EGD.    Consultants:   Gastroenterology Dr Collene Mares.    Procedures:EGD scheduled for tomorrow.    Antimicrobials: none.    Subjective: No BM since yesterday afternoon.  No chest pain or sob.    No nausea or vomiting.  Some soreness in the lower abdomen.   Objective: Vitals:   10/22/17 1738 10/22/17 1902 10/22/17 2116 10/23/17 0549  BP: (!) 123/97  108/67 (!) 132/98  Pulse: 88  89 86  Resp: 20  18 18   Temp: 98.3 F (36.8 C)  98.3 F (36.8 C) 97.6 F (36.4 C)  TempSrc: Oral  Oral Oral  SpO2: 98%  96% 95%  Weight:  118.8 kg    Height:  5\' 7"  (1.702 m)      Intake/Output Summary (Last 24 hours) at 10/23/2017 1100 Last data filed at 10/23/2017 0751 Gross per 24 hour  Intake 1356.21 ml  Output 1125 ml  Net 231.21 ml   Filed Weights   10/22/17 1355 10/22/17 1902  Weight: 125.2 kg 118.8 kg    Examination:  General exam: Appears calm and comfortable  Respiratory system: Clear to auscultation. Respiratory effort normal. Cardiovascular system: S1 & S2 heard, RRR. No JVD, murmurs,No pedal edema. Gastrointestinal system: Abdomen is nondistended, soft and nontender. Central nervous system: Alert and oriented. No focal neurological deficits. Extremities: Symmetric 5 x 5 power. Skin: No rashes, lesions or ulcers Psychiatry:. Mood & affect appropriate.     Data Reviewed: I have personally reviewed following labs and imaging studies  CBC: Recent Labs  Lab 10/22/17 1358 10/22/17 1845 10/23/17 0023 10/23/17 0641  WBC 7.0  --   --  5.0  HGB 15.4  --   --  14.7  HCT 44.8 45.5 43.4 43.7  MCV 96.6  --   --  98.0  PLT 198  --   --  299   Basic Metabolic Panel: Recent Labs  Lab 10/22/17 1358 10/23/17 0641  NA 139 142  K 4.5 3.9  CL 105 106  CO2 26 28  GLUCOSE 123* 107*  BUN 16 13  CREATININE 1.00 1.03  CALCIUM 9.2 8.8*   GFR: Estimated Creatinine Clearance: 89.4 mL/min (by C-G formula based on SCr of 1.03 mg/dL). Liver Function Tests: Recent Labs  Lab 10/22/17 1358  AST 30  ALT 35  ALKPHOS 41  BILITOT 1.3*  PROT 7.0  ALBUMIN 3.9   No results for input(s): LIPASE, AMYLASE in the last 168 hours. No results for input(s): AMMONIA in the last 168  hours. Coagulation Profile: Recent Labs  Lab 10/22/17 1502  INR 1.26   Cardiac Enzymes: No results for input(s): CKTOTAL, CKMB, CKMBINDEX, TROPONINI in the last 168 hours. BNP (last 3 results) Recent Labs    02/28/17 1621  PROBNP 2,250*   HbA1C: No results for input(s): HGBA1C in the last 72 hours. CBG: No results for input(s): GLUCAP in the last 168 hours. Lipid Profile: No results for input(s): CHOL, HDL, LDLCALC, TRIG, CHOLHDL, LDLDIRECT in the last 72 hours. Thyroid Function Tests: No results for input(s): TSH, T4TOTAL, FREET4, T3FREE, THYROIDAB in the last 72 hours. Anemia Panel: No results for input(s): VITAMINB12, FOLATE, FERRITIN, TIBC, IRON, RETICCTPCT in the last 72 hours. Sepsis Labs: No results for input(s): PROCALCITON, LATICACIDVEN in the last 168 hours.  No results found for this or any previous visit (from the past 240 hour(s)).       Radiology Studies: No results found.      Scheduled Meds: . atorvastatin  40 mg Oral q1800  . B-complex with vitamin C  1 tablet Oral Daily  . carvedilol  12.5 mg Oral BID WC  . cholecalciferol  1,000 Units Oral Daily  . loratadine  10 mg Oral Daily  . magnesium oxide  400 mg Oral Daily  . multivitamin with minerals  1 tablet Oral Daily  . omega-3 acid ethyl esters  2 g Oral Daily  . umeclidinium-vilanterol  1 puff Inhalation Daily  . vitamin C  500 mg Oral Daily   Continuous Infusions: . sodium chloride 75 mL/hr at 10/23/17 0532  . pantoprozole (PROTONIX) infusion 8 mg/hr (10/23/17 0531)     LOS: 1 day    Time spent: 35 minutes.     Hosie Poisson, MD [facility] Pager [phone]   If 7PM-7AM, please contact night-coverage [email or web address] Password [email or web address] 10/23/2017, 11:00 AM

## 2017-10-23 NOTE — Progress Notes (Signed)
CROSS COVER [facility] Subjective: Norman Rodriguez is a 62 year old morbidly obese white male with a history of coronary artery disease status post bare metal stent to the LAD and occluded RCA with left-to-right collaterals paroxysmal atrial fibrillation status post multiple cardioversions on Xarelto for DVT. He also has chronic systolic heart failure with EF of 35%. Since I last evaluated the patient, he seems to be fairly stable. He has had no further problems with rectal bleeding.[name] Recent Cologuard was negative.  Objective: Vital signs in last 24 hours: Temp:  [97.6 F (36.4 C)-98.5 F (36.9 C)] 97.6 F (36.4 C) (09/29 0549) Pulse Rate:  [81-95] 86 (09/29 0549) Resp:  [16-22] 18 (09/29 0549) BP: (103-132)/(67-98) 132/98 (09/29 0549) SpO2:  [93 %-98 %] 95 % (09/29 0549) Weight:  [118.8 kg-125.2 kg] 118.8 kg (09/28 1902) Last BM Date: 10/22/17  Intake/Output from previous day: 09/28 0701 - 09/29 0700 In: 1356.2 [P.O.:240; I.V.:1018.4; IV Piggyback:97.9] Out: 725 [Urine:725] Intake/Output this shift: Total I/O In: -  Out: 400 [Urine:400]  General appearance: alert, cooperative, appears stated age, no distress and morbidly obese Resp: clear to auscultation bilaterally Cardio: regular rate and rhythm, S1, S2 normal, no murmur, click, rub or gallop GI: soft, non-tender; bowel sounds normal; no masses,  no organomegaly  Lab Results: Recent Labs    10/22/17 1358 10/22/17 1845 10/23/17 0023 10/23/17 0641  WBC 7.0  --   --  5.0  HGB 15.4  --   --  14.7  HCT 44.8 45.5 43.4 43.7  PLT 198  --   --  184   BMET Recent Labs    10/22/17 1358 10/23/17 0641  NA 139 142  K 4.5 3.9  CL 105 106  CO2 26 28  GLUCOSE 123* 107*  BUN 16 13  CREATININE 1.00 1.03  CALCIUM 9.2 8.8*   LFT Recent Labs    10/22/17 1358  PROT 7.0  ALBUMIN 3.9  AST 30  ALT 35  ALKPHOS 41  BILITOT 1.3*   PT/INR Recent Labs    10/22/17 1502  LABPROT 15.7*  INR 1.26   Medications: I have reviewed the  patient's current medications.  Assessment/Plan: 1) Melenic stools with some rectal bleeding-patient has been on Meloxicam and Indomethacin given to him by Dr. Jonathon Bellows for his shoulder in addition to the Zaroxolyn is taking for his DVT nature fibrillation. These medications are now on hold and EGD is planned for tomorrow for the recommendation made and follow-up. Will continue PPIs for now I suspect is an upper GI source of bleeding.  LOS: 1 day   Emorie Mcfate 10/23/2017, 10:26 AM

## 2017-10-24 ENCOUNTER — Inpatient Hospital Stay (HOSPITAL_COMMUNITY): Payer: Medicare Other | Admitting: Anesthesiology

## 2017-10-24 ENCOUNTER — Encounter (HOSPITAL_COMMUNITY): Payer: Self-pay | Admitting: *Deleted

## 2017-10-24 ENCOUNTER — Encounter (HOSPITAL_COMMUNITY)
Admission: EM | Disposition: A | Discharge status: Home / Self Care | Payer: Self-pay | Source: Home / Self Care | Attending: Internal Medicine

## 2017-10-24 DIAGNOSIS — K3189 Other diseases of stomach and duodenum: Secondary | ICD-10-CM

## 2017-10-24 DIAGNOSIS — K921 Melena: Secondary | ICD-10-CM

## 2017-10-24 DIAGNOSIS — K210 Gastro-esophageal reflux disease with esophagitis: Secondary | ICD-10-CM

## 2017-10-24 HISTORY — PX: ESOPHAGOGASTRODUODENOSCOPY (EGD) WITH PROPOFOL: SHX5813

## 2017-10-24 SURGERY — ESOPHAGOGASTRODUODENOSCOPY (EGD) WITH PROPOFOL
Anesthesia: Monitor Anesthesia Care

## 2017-10-24 MED ORDER — LIDOCAINE 2% (20 MG/ML) 5 ML SYRINGE
INTRAMUSCULAR | Status: DC | PRN
  Administered 2017-10-24: 100 mg via INTRAVENOUS

## 2017-10-24 MED ORDER — PROPOFOL 10 MG/ML IV BOLUS
INTRAVENOUS | Status: AC
  Filled 2017-10-24: qty 40

## 2017-10-24 MED ORDER — PROPOFOL 10 MG/ML IV BOLUS
INTRAVENOUS | Status: DC | PRN
  Administered 2017-10-24: 40 mg via INTRAVENOUS
  Administered 2017-10-24 (×2): 20 mg via INTRAVENOUS

## 2017-10-24 MED ORDER — RIVAROXABAN 20 MG PO TABS
20.0000 mg | ORAL_TABLET | Freq: Every day | ORAL | Status: DC
  Administered 2017-10-24: 20 mg via ORAL
  Filled 2017-10-24: qty 1

## 2017-10-24 SURGICAL SUPPLY — 15 items

## 2017-10-24 NOTE — Anesthesia Postprocedure Evaluation (Signed)
Anesthesia Post Note  Patient: Norman Rodriguez  Procedure(s) Performed: ESOPHAGOGASTRODUODENOSCOPY (EGD) WITH PROPOFOL (N/A )     Patient location during evaluation: PACU Anesthesia Type: MAC Level of consciousness: awake and alert Pain management: pain level controlled Vital Signs Assessment: post-procedure vital signs reviewed and stable Respiratory status: spontaneous breathing, nonlabored ventilation, respiratory function stable and patient connected to nasal cannula oxygen Cardiovascular status: stable and blood pressure returned to baseline Postop Assessment: no apparent nausea or vomiting Anesthetic complications: no    Last Vitals:  Vitals:   10/24/17 1246 10/24/17 1256  BP: (!) 128/102 126/84  Pulse: 90 (!) 101  Resp: 19 (!) 23  Temp:    SpO2: 100% 97%    Last Pain:  Vitals:   10/24/17 1440  TempSrc:   PainSc: 0-No pain                 Norman Rodriguez

## 2017-10-24 NOTE — Anesthesia Preprocedure Evaluation (Signed)
Anesthesia Evaluation  Patient identified by MRN, date of birth, ID band Patient awake    Reviewed: Allergy & Precautions, NPO status , Patient's Chart, lab work & pertinent test results, reviewed documented beta blocker date and time   Airway Mallampati: III  TM Distance: >3 FB Neck ROM: Full    Dental no notable dental hx.    Pulmonary COPD, former smoker,    Pulmonary exam normal breath sounds clear to auscultation       Cardiovascular hypertension, Pt. on home beta blockers + CAD and + Cardiac Stents  Normal cardiovascular exam+ dysrhythmias Atrial Fibrillation  Rhythm:Regular Rate:Normal  ECG: a-fib, rate 103  Sees cardiologist [facility])  ECHO Technically difficult; definity used; akinesis of the inferior and inferolateral walls with overall moderate to severe LV dysfunction; mild LVH and LVE. EF 30 - 35 %   Neuro/Psych negative neurological ROS  negative psych ROS   GI/Hepatic negative GI ROS, Neg liver ROS,   Endo/Other  Morbid obesity  Renal/GU negative Renal ROS     Musculoskeletal negative musculoskeletal ROS (+)   Abdominal   Peds  Hematology HLD   Anesthesia Other Findings Melena with rectal bleeding  Reproductive/Obstetrics                             Anesthesia Physical Anesthesia Plan  ASA: III  Anesthesia Plan: MAC   Post-op Pain Management:    Induction: Intravenous  PONV Risk Score and Plan: 1 and Propofol infusion and Treatment may vary due to age or medical condition  Airway Management Planned: Nasal Cannula  Additional Equipment:   Intra-op Plan:   Post-operative Plan:   Informed Consent: I have reviewed the patients History and Physical, chart, labs and discussed the procedure including the risks, benefits and alternatives for the proposed anesthesia with the patient or authorized representative who has indicated his/her understanding and acceptance.    Dental advisory given  Plan Discussed with: CRNA  Anesthesia Plan Comments:         Anesthesia Quick Evaluation

## 2017-10-24 NOTE — Anesthesia Procedure Notes (Signed)
Date/Time: 10/24/2017 12:25 PM Performed by: Talbot Grumbling, CRNA Oxygen Delivery Method: Nasal cannula

## 2017-10-24 NOTE — Anesthesia Procedure Notes (Signed)
Date/Time: 10/24/2017 12:33 PM Performed by: Talbot Grumbling, CRNA Oxygen Delivery Method: Simple face mask

## 2017-10-24 NOTE — Progress Notes (Signed)
PROGRESS NOTE    Norman Rodriguez  NTZ:[id number] DOB: [birth year] DOA: 10/22/2017 PCP: Medicine, [facility]    Brief Narrative:  Norman Rodriguez is a 62 y.o. male with medical history significant of coronary artery disease status post bare-metal stent to LAD and occluded RCA with left-to-right collaterals, paroxysmal atrial fibrillation status post multiple cardioversions on rivaroxaban, DVT on rivaroxaban, chronic systolic heart failure with EF of 30 to 35% by echo on 03/21/2017, hypertension, hyperlipidemia, NSVT, restrictive lung disease/COPD who comes in with generalized weakness and orthopnea as well as black tarry stools. He was admitted for GI bleed.  Assessment & Plan:   Active Problems:   Atrial fibrillation (HCC)   CAD (coronary artery disease)   Chronic anticoagulation   NSVT (nonsustained ventricular tachycardia) (HCC)   Hyperlipidemia   Obesity   HTN (hypertension)   Chronic systolic heart failure (HCC)   PAF (paroxysmal atrial fibrillation) (HCC)   Dyspnea and respiratory abnormality   GI bleeding   COPD (chronic obstructive pulmonary disease) ([place])   Melena   Malena:  Suspect upper GI bleed.  H&H appear to remain stable.  GI consulted and plan for EGD, underwent EGD today showing esophagitis.  Change IV PPI to oral PPI. One episode of malena today.  GI Recommending to start xarelto overnight. Will watch him for any signs of bleeding and check H&H in am.     Paroxysmal atrial fibrillation:  Rate controlled with coreg and restart xarelto this am.    Chronic systolic heart failure:  He appears euvolemic.  Lasix as needed.   Hypertension:  Well controlled.    COPD: NO wheezing heard on exam.    Dizziness today and yesterday evening.  Unclear etiology.  Orthostatics negative.  Vestibular evaluation by PT.  If negative. Will need further evaluation by ct head.      DVT prophylaxis: scd's Code Status: full code.  Family  Communication: none at bedside.  Disposition Plan: pending EGD.    Consultants:   Gastroenterology Dr Collene Mares.    Procedures:EGD scheduled for tomorrow.    Antimicrobials: none.    Subjective: One episode of malena this am.  No chest pain or sob.  Dizziness on standing up.   Objective: Vitals:   10/24/17 0455 10/24/17 1122 10/24/17 1246 10/24/17 1256  BP: (!) 129/92 (!) 145/97 (!) 128/102 126/84  Pulse: 79 90 90 (!) 101  Resp:  (!) 29 19 (!) 23  Temp: 98.5 F (36.9 C) 98.4 F (36.9 C)    TempSrc: Oral Oral    SpO2: 97% 94% 100% 97%  Weight:  118.8 kg    Height:  5\' 7"  (1.702 m)      Intake/Output Summary (Last 24 hours) at 10/24/2017 1625 Last data filed at 10/24/2017 1441 Gross per 24 hour  Intake 867.33 ml  Output 3300 ml  Net -2432.67 ml   Filed Weights   10/22/17 1355 10/22/17 1902 10/24/17 1122  Weight: 125.2 kg 118.8 kg 118.8 kg    Examination:  General exam: Appears calm and comfortable no tin distress.  Respiratory system: Clear to auscultation. Respiratory effort normal. No wheezing or rhonchi.  Cardiovascular system: S1 & S2 heard, RRR. No JVD, murmurs,. Gastrointestinal system: Abdomen is soft NT nd bs+ Central nervous system: Alert and oriented. Non focal.  Extremities: Symmetric 5 x 5 power. Skin: No rashes, lesions or ulcers Psychiatry:. Mood & affect appropriate.     Data Reviewed: I have personally reviewed following labs and imaging studies  CBC: Recent Labs  Lab 10/22/17 1358 10/22/17 1845 10/23/17 0023 10/23/17 0641 10/23/17 1249  WBC 7.0  --   --  5.0  --   HGB 15.4  --   --  14.7  --   HCT 44.8 45.5 43.4 43.7 44.6  MCV 96.6  --   --  98.0  --   PLT 198  --   --  184  --    Basic Metabolic Panel: Recent Labs  Lab 10/22/17 1358 10/23/17 0641  NA 139 142  K 4.5 3.9  CL 105 106  CO2 26 28  GLUCOSE 123* 107*  BUN 16 13  CREATININE 1.00 1.03  CALCIUM 9.2 8.8*   GFR: Estimated Creatinine Clearance: 89.4 mL/min (by C-G  formula based on SCr of 1.03 mg/dL). Liver Function Tests: Recent Labs  Lab 10/22/17 1358  AST 30  ALT 35  ALKPHOS 41  BILITOT 1.3*  PROT 7.0  ALBUMIN 3.9   No results for input(s): LIPASE, AMYLASE in the last 168 hours. No results for input(s): AMMONIA in the last 168 hours. Coagulation Profile: Recent Labs  Lab 10/22/17 1502  INR 1.26   Cardiac Enzymes: No results for input(s): CKTOTAL, CKMB, CKMBINDEX, TROPONINI in the last 168 hours. BNP (last 3 results) Recent Labs    02/28/17 1621  PROBNP 2,250*   HbA1C: No results for input(s): HGBA1C in the last 72 hours. CBG: No results for input(s): GLUCAP in the last 168 hours. Lipid Profile: No results for input(s): CHOL, HDL, LDLCALC, TRIG, CHOLHDL, LDLDIRECT in the last 72 hours. Thyroid Function Tests: No results for input(s): TSH, T4TOTAL, FREET4, T3FREE, THYROIDAB in the last 72 hours. Anemia Panel: No results for input(s): VITAMINB12, FOLATE, FERRITIN, TIBC, IRON, RETICCTPCT in the last 72 hours. Sepsis Labs: No results for input(s): PROCALCITON, LATICACIDVEN in the last 168 hours.  No results found for this or any previous visit (from the past 240 hour(s)).       Radiology Studies: No results found.      Scheduled Meds: . atorvastatin  40 mg Oral q1800  . B-complex with vitamin C  1 tablet Oral Daily  . carvedilol  12.5 mg Oral BID WC  . cholecalciferol  1,000 Units Oral Daily  . loratadine  10 mg Oral Daily  . magnesium oxide  400 mg Oral Daily  . multivitamin with minerals  1 tablet Oral Daily  . omega-3 acid ethyl esters  2 g Oral Daily  . rivaroxaban  20 mg Oral Q supper  . umeclidinium-vilanterol  1 puff Inhalation Daily  . vitamin C  500 mg Oral Daily   Continuous Infusions:    LOS: 2 days    Time spent: 35 minutes.     Hosie Poisson, MD [facility] Pager [phone]   If 7PM-7AM, please contact night-coverage [email or web address] Password [email or web address] 10/24/2017, 4:25 PM

## 2017-10-24 NOTE — Progress Notes (Signed)
ANTICOAGULATION CONSULT NOTE - Initial Consult  Pharmacy Consult for rivaroxaban  Indication: atrial fibrillation  Allergies  Allergen Reactions  . Apple Hives  . Soybean Oil Other (See Comments)    unknown  . Lisinopril Cough  . Other Itching    Pt has itching and joint swelling with walnuts, mildew, fungi, basil and peaches  . Bean Pod Extract Rash    Green Beans specifically Joint swelling and itching  . Carrot [Daucus Carota] Rash    Joint swelling and itching  . Grass Extracts [Gramineae Pollens] Rash    Joint swelling and itching  . Peach Flavor Rash    Joint swelling and itching  . Tree Extract Rash    Joint swelling and itching    Patient Measurements: Height: 5\' 7"  (170.2 cm) Weight: 261 lb 14.4 oz (118.8 kg) IBW/kg (Calculated) : 66.1   Vital Signs: Temp: 98.4 F (36.9 C) (09/30 1122) Temp Source: Oral (09/30 1122) BP: 126/84 (09/30 1256) Pulse Rate: 101 (09/30 1256)  Labs: Recent Labs    10/22/17 1358 10/22/17 1502  10/23/17 0023 10/23/17 0641 10/23/17 1249  HGB 15.4  --   --   --  14.7  --   HCT 44.8  --    < > 43.4 43.7 44.6  PLT 198  --   --   --  184  --   LABPROT  --  15.7*  --   --   --   --   INR  --  1.26  --   --   --   --   CREATININE 1.00  --   --   --  1.03  --    < > = values in this interval not displayed.    Estimated Creatinine Clearance: 89.4 mL/min (by C-G formula based on SCr of 1.03 mg/dL).   Medical History: Past Medical History:  Diagnosis Date  . Arthritis   . Atrial fibrillation ([place])    a. Dx 2010. b. Initiated on Tikosyn 03/2013.  [name] CAD (coronary artery disease)    a. BMS 2.59mm 23 to LAD. CTO of RCA unsuccessful, good left to right collaterals - 2010.  [name] Chronic anticoagulation   . Chronic systolic CHF (congestive heart failure) ([place])    a. Both ischemic and tachy induced. b. Echo 04/2013: EF 30-35%, no RWMA, normal RV, mod dilated LA (before restoration of NSR).  . COPD (chronic obstructive pulmonary disease)  ([place]) 10/22/2017  . Dysrhythmia    hx of atrial fibrilation  . HTN (hypertension)   . Hyperlipidemia   . NSVT (nonsustained ventricular tachycardia) ([place])   . Obesity   . Shortness of breath     Assessment: [name] admitted with chief complaint of generalized weakness, orthopnea, and black tarry stools. Patient was taking NSAIDs PTA for shoulder pain as well as Xarelto for a-fib. Both held on admission. EGD today revealed reflux esophagitis. GI recommended to resume Xarelto today and hold any further NSAIDs. Pharmacy consulted to resume Xarelto at appropriate dose.     Plan:   Resume rivaroxaban 20mg  PO daily with supper   Monitor renal function, CBC, and for further s/sx of bleeding.    [name], PharmD, BCPS Pager: [phone] 10/24/2017 2:50 PM

## 2017-10-24 NOTE — H&P (Signed)
[facility] History and Physical   Primary Care Physician:  Medicine, [facility]   Reason for Procedure:  Melena Plan:    EGD with possible intervention    HPI: Norman Rodriguez is a 62 y.o. male with history of CAD, A. fib on chronic anticoagulation with Xarelto presented with melena.  He was taking NSAIDs regularly prior to presentation for shoulder pain.  Hemoglobin  trended trended down from 16 to 14.  Hemodynamically stable.  The risks and benefits as well as alternatives of endoscopic procedure(s) have been discussed and reviewed. All questions answered. The patient agrees to proceed.   Past Medical History:  Diagnosis Date  . Arthritis   . Atrial fibrillation ([place])    a. Dx 2010. b. Initiated on Tikosyn 03/2013.  [name] CAD (coronary artery disease)    a. BMS 2.58mm 23 to LAD. CTO of RCA unsuccessful, good left to right collaterals - 2010.  [name] Chronic anticoagulation   . Chronic systolic CHF (congestive heart failure) ([place])    a. Both ischemic and tachy induced. b. Echo 04/2013: EF 30-35%, no RWMA, normal RV, mod dilated LA (before restoration of NSR).  . COPD (chronic obstructive pulmonary disease) ([place]) 10/22/2017  . Dysrhythmia    hx of atrial fibrilation  . HTN (hypertension)   . Hyperlipidemia   . NSVT (nonsustained ventricular tachycardia) ([place])   . Obesity   . Shortness of breath     Past Surgical History:  Procedure Laterality Date  . CARDIOVERSION N/A 04/27/2013   Procedure: BEDSIDE CARDIOVERSION;  Surgeon: Sanda Klein, MD;  Location: [place];  Service: Cardiovascular;  Laterality: N/A;  . CARDIOVERSION N/A 05/15/2013   Procedure: CARDIOVERSION/BEDSIDE;  Surgeon: Larey Dresser, MD;  Location: [facility];  Service: Cardiovascular;  Laterality: N/A;  . CARDIOVERSION N/A 05/16/2014   Procedure: CARDIOVERSION;  Surgeon: Jerline Pain, MD;  Location: [place];  Service: Cardiovascular;  Laterality: N/A;  . CARDIOVERSION N/A 06/07/2014   Procedure: CARDIOVERSION;  Surgeon: Jerline Pain, MD;  Location: [facility];  Service: Cardiovascular;  Laterality: N/A;  . CORONARY ANGIOPLASTY WITH STENT PLACEMENT  2010   BMS 2.19mm 23 to LAD. CTO of RCA unsuccessful, good left to right collaterals  . FOOT SURGERY Right     Prior to Admission medications   Medication Sig Start Date End Date Taking? Authorizing Provider  ANORO ELLIPTA 62.5-25 MCG/INH AEPB INHALE ONE PUFFS INTO THE LUNGS DAILY Patient taking differently: Inhale 1 puff into the lungs daily.  09/30/17  Yes Lauraine Rinne, NP  atorvastatin (LIPITOR) 40 MG tablet TAKE 1 TABLET (40 MG TOTAL) BY MOUTH DAILY. Patient taking differently: Take 40 mg by mouth daily at 6 PM.  03/12/15  Yes Skains, Thana Farr, MD  B Complex-C (B-COMPLEX WITH VITAMIN C) tablet Take 1 tablet by mouth daily.   Yes [provider]  carvedilol (COREG) 12.5 MG tablet TAKE 1 TABLET (12.5 MG TOTAL) BY MOUTH TWO TIMES DAILY WITH A MEAL. Patient taking differently: Take 12.5 mg by mouth 2 (two) times daily with a meal. TAKE 1 TABLET (12.5 MG TOTAL) BY MOUTH TWO TIMES DAILY WITH A MEAL. 07/30/16  Yes Jerline Pain, MD  Coenzyme Q10 (CO Q-10) 200 MG CAPS Take 1 capsule by mouth daily.   Yes [provider]  fexofenadine (ALLEGRA) 180 MG tablet Take 180 mg by mouth daily.   Yes [provider]  furosemide (LASIX) 40 MG tablet Take 0.5 tablets (20 mg total) by mouth daily.  Take an additional 40 mg by mouth as needed for fluid weight gain. Patient taking differently: Take 20-60 mg by mouth daily. Take an additional 40 mg by mouth as needed for fluid weight gain. 08/01/17  Yes Jerline Pain, MD  gabapentin (NEURONTIN) 300 MG capsule Take 600 mg by mouth as needed (carpel tunnel).    Yes [provider]  LevOCARNitine (CARNITINE PO) Take 500 mg by mouth daily.   Yes [provider]  Magnesium 400 MG CAPS Take 1 capsule by mouth daily.    Yes [provider]  meloxicam (MOBIC)  15 MG tablet Take 15 mg by mouth daily.   Yes [provider]  Multiple Vitamin (MULTIVITAMIN) tablet Take 1 tablet by mouth daily.   Yes [provider]  nitroGLYCERIN (NITROSTAT) 0.4 MG SL tablet Place 0.4 mg under the tongue every 5 (five) minutes as needed for chest pain.   Yes [provider]  NON FORMULARY Take 1 capsule by mouth daily. (Ribose) Take 1 capsule once a day    Yes [provider]  NON FORMULARY Take 1 tablet by mouth daily. Glycine viatmin 1 tab po qd   Yes [provider]  NON FORMULARY Take 465 mg by mouth as needed (joint pain). [place]    Yes [provider]  Omega-3 Fatty Acids (OMEGA 3 PO) Take 2 capsules by mouth daily.    Yes [provider]  potassium chloride SA (K-DUR,KLOR-CON) 20 MEQ tablet TAKE 1 TABLET (20 MEQ TOTAL) BY MOUTH DAILY. 06/24/17  Yes Jerline Pain, MD  sacubitril-valsartan (ENTRESTO) 49-51 MG Take 1 tablet by mouth 2 (two) times daily. 10/17/17  Yes Jerline Pain, MD  sildenafil (VIAGRA) 100 MG tablet Take 1 tablet (100 mg total) by mouth daily as needed for erectile dysfunction. 04/14/17  Yes Jerline Pain, MD  vitamin C (ASCORBIC ACID) 500 MG tablet Take 500 mg by mouth daily.   Yes [provider]  VITAMIN D, CHOLECALCIFEROL, PO Take 2 tablets by mouth daily.   Yes [provider]  XARELTO 20 MG TABS tablet TAKE 1 TABLET (20 MG TOTAL) BY MOUTH DAILY WITH SUPPER. Patient taking differently: Take 20 mg by mouth daily with supper.  03/09/17  Yes Jerline Pain, MD    Current Facility-Administered Medications  Medication Dose Route Frequency Provider Last Rate Last Dose  . 0.9 %  sodium chloride infusion   Intravenous Continuous Juanita Craver, MD      . 0.9 %  sodium chloride infusion   Intravenous Continuous Juanita Craver, MD 20 mL/hr at 10/24/17 0458    . acetaminophen (TYLENOL) tablet 650 mg  650 mg Oral Q6H PRN Purohit, Shrey C, MD       Or  .  acetaminophen (TYLENOL) suppository 650 mg  650 mg Rectal Q6H PRN Purohit, Shrey C, MD      . atorvastatin (LIPITOR) tablet 40 mg  40 mg Oral q1800 Purohit, Shrey C, MD      . B-complex with vitamin C tablet 1 tablet  1 tablet Oral Daily Purohit, Konrad Dolores, MD   1 tablet at 10/23/17 0903  . carvedilol (COREG) tablet 12.5 mg  12.5 mg Oral BID WC Purohit, Shrey C, MD   12.5 mg at 10/24/17 1034  . cholecalciferol (VITAMIN D) tablet 1,000 Units  1,000 Units Oral Daily Purohit, Konrad Dolores, MD   1,000 Units at 10/23/17 0903  . gabapentin (NEURONTIN) capsule 600 mg  600 mg Oral  Q8H PRN Purohit, Konrad Dolores, MD      . loratadine (CLARITIN) tablet 10 mg  10 mg Oral Daily Purohit, Shrey C, MD   10 mg at 10/22/17 2106  . magnesium oxide (MAG-OX) tablet 400 mg  400 mg Oral Daily Purohit, Shrey C, MD   400 mg at 10/23/17 0903  . multivitamin with minerals tablet 1 tablet  1 tablet Oral Daily Purohit, Konrad Dolores, MD   1 tablet at 10/23/17 0903  . omega-3 acid ethyl esters (LOVAZA) capsule 2 g  2 g Oral Daily Purohit, Shrey C, MD   2 g at 10/23/17 0902  . ondansetron (ZOFRAN) tablet 4 mg  4 mg Oral Q6H PRN Purohit, Shrey C, MD       Or  . ondansetron (ZOFRAN) injection 4 mg  4 mg Intravenous Q6H PRN Purohit, Shrey C, MD      . pantoprazole (PROTONIX) 80 mg in sodium chloride 0.9 % 250 mL (0.32 mg/mL) infusion  8 mg/hr Intravenous Continuous Purohit, Shrey C, MD 25 mL/hr at 10/24/17 0458 8 mg/hr at 10/24/17 0458  . traMADol (ULTRAM) tablet 50 mg  50 mg Oral Q6H PRN Purohit, Shrey C, MD      . umeclidinium-vilanterol (ANORO ELLIPTA) 62.5-25 MCG/INH 1 puff  1 puff Inhalation Daily Purohit, Shrey C, MD      . vitamin C (ASCORBIC ACID) tablet 500 mg  500 mg Oral Daily Purohit, Shrey C, MD   500 mg at 10/23/17 0903    Allergies as of 10/22/2017 - Review Complete 10/22/2017  Allergen Reaction Noted  . Apple Hives 02/26/2016  . Soybean oil Other (See Comments) 04/16/2015  . Lisinopril Cough 01/22/2013  . Other Itching  03/25/2017  . Bean pod extract Rash 03/21/2017  . Carrot [daucus carota] Rash 03/21/2017  . Grass extracts [gramineae pollens] Rash 03/21/2017  . Peach flavor Rash 03/21/2017  . Tree extract Rash 03/21/2017    Family History  Problem Relation Age of Onset  . Other Father        Car accident  . Coronary artery disease Neg Hx     Social History   Socioeconomic History  . Marital status: Single    Spouse name: Not on file  . Number of children: Not on file  . Years of education: Not on file  . Highest education level: Not on file  Occupational History  . Not on file  Social Needs  . Financial resource strain: Not on file  . Food insecurity:    Worry: Not on file    Inability: Not on file  . Transportation needs:    Medical: Not on file    Non-medical: Not on file  Tobacco Use  . Smoking status: Former Smoker    Types: Cigars  . Smokeless tobacco: Never Used  . Tobacco comment: 1 cigar per month states he stopped smoking  Substance and Sexual Activity  . Alcohol use: No  . Drug use: No    Comment: 1 per month...pt states he stopped smoking  . Sexual activity: Not on file  Lifestyle  . Physical activity:    Days per week: Not on file    Minutes per session: Not on file  . Stress: Not on file  Relationships  . Social connections:    Talks on phone: Not on file    Gets together: Not on file    Attends religious service: Not on file    Active member of club or organization: Not on file  Attends meetings of clubs or organizations: Not on file    Relationship status: Not on file  . Intimate partner violence:    Fear of current or ex partner: Not on file    Emotionally abused: Not on file    Physically abused: Not on file    Forced sexual activity: Not on file  Other Topics Concern  . Not on file  Social History Narrative  . Not on file    Review of Systems:  All other review of systems negative except as mentioned in the HPI.  Physical Exam: Vital signs  in last 24 hours: Temp:  [98.4 F (36.9 C)-98.7 F (37.1 C)] 98.4 F (36.9 C) (09/30 1122) Pulse Rate:  [75-90] 90 (09/30 1122) Resp:  [20-29] 29 (09/30 1122) BP: (109-145)/(69-97) 145/97 (09/30 1122) SpO2:  [94 %-98 %] 94 % (09/30 1122) Weight:  [118.8 kg] 118.8 kg (09/30 1122) Last BM Date: 10/24/17 General:   Alert,  Well-developed, well-nourished, pleasant and cooperative in NAD Lungs:  Clear throughout to auscultation.   Heart:  Regular rate and rhythm; no murmurs, clicks, rubs,  or gallops. Abdomen:  Soft, nontender and nondistended. Normal bowel sounds.   Neuro/Psych:  Alert and cooperative. Normal mood and affect. A and O x 3   K. Denzil Magnuson , MD [phone]

## 2017-10-24 NOTE — Transfer of Care (Signed)
Immediate Anesthesia Transfer of Care Note  Patient: [name]  Procedure(s) Performed: ESOPHAGOGASTRODUODENOSCOPY (EGD) WITH PROPOFOL (N/A )  Patient Location: PACU  Anesthesia Type:MAC  Level of Consciousness: awake, alert  and oriented  Airway & Oxygen Therapy: Patient Spontanous Breathing and Patient connected to face mask oxygen  Post-op Assessment: Report given to RN and Post -op Vital signs reviewed and stable  Post vital signs: Reviewed and stable  Last Vitals:  Vitals Value Taken Time  BP 128/102 10/24/2017 12:46 PM  Temp    Pulse 96 10/24/2017 12:47 PM  Resp 25 10/24/2017 12:47 PM  SpO2 100 % 10/24/2017 12:47 PM  Vitals shown include unvalidated device data.  Last Pain:  Vitals:   10/24/17 1122  TempSrc: Oral  PainSc: 0-No pain         Complications: No apparent anesthesia complications

## 2017-10-24 NOTE — Op Note (Addendum)
[facility] Patient Name: Norman Rodriguez Procedure Date: 10/24/2017 MRN: [id number] Attending MD: Mauri Pole , MD Date of Birth: 1955/04/06 CSN: [id number] Age: 62 Admit Type: Inpatient Procedure:                Upper GI endoscopy Indications:              Recent gastrointestinal bleeding, Suspected upper                            gastrointestinal bleeding, Melena Providers:                Mauri Pole, MD, William Dalton,                            Technician, Vista Lawman, RN Referring MD:              Medicines:                Monitored Anesthesia Care Complications:            No immediate complications. Estimated Blood Loss:     Estimated blood loss was minimal. Procedure:                Pre-Anesthesia Assessment:                           - Prior to the procedure, a History and Physical                            was performed, and patient medications and                            allergies were reviewed. The patient's tolerance of                            previous anesthesia was also reviewed. The risks                            and benefits of the procedure and the sedation                            options and risks were discussed with the patient.                            All questions were answered, and informed consent                            was obtained. Prior Anticoagulants: The patient                            last took Xarelto (rivaroxaban) 3 days prior to the                            procedure. ASA Grade Assessment: III - A patient  with severe systemic disease. After reviewing the                            risks and benefits, the patient was deemed in                            satisfactory condition to undergo the procedure.                           After obtaining informed consent, the endoscope was                            passed under direct vision. Throughout the                             procedure, the patient's blood pressure, pulse, and                            oxygen saturations were monitored continuously. The                            GIF-H190 ([id number] Olympus adult endoscope was                            introduced through the mouth, and advanced to the                            second part of duodenum. The upper GI endoscopy was                            technically difficult and complex due to the                            patient's body habitus and the patient's oxygen                            desaturation. Successful completion of the                            procedure was aided by performing chin lift and                            administering oxygen. The patient tolerated the                            procedure well. Scope In: Scope Out: Findings:      LA Grade A (one or more mucosal breaks less than 5 mm, not extending       between tops of 2 mucosal folds) esophagitis with no bleeding was found       35 to 36 cm from the incisors.      The stomach was normal.      Patchy mildly erythematous mucosa without active bleeding and with no       stigmata of bleeding was found  in the duodenal bulb.      The second portion of the duodenum was normal. Impression:               - LA Grade A reflux esophagitis.                           - Normal stomach.                           - Erythematous duodenopathy.                           - Normal second portion of the duodenum.                           - No specimens collected. Moderate Sedation:      N/A Recommendation:           - Patient has a contact number available for                            emergencies. The signs and symptoms of potential                            delayed complications were discussed with the                            patient. Return to normal activities tomorrow.                            Written discharge instructions were provided to the                             patient.                           - Resume previous diet.                           - Continue present medications.                           - Resume Xarelto (rivaroxaban) at prior dose today.                            Refer to managing physician for further adjustment                            of therapy.                           - No ibuprofen, naproxen, or other non-steroidal                            anti-inflammatory drugs.                           - Ok to discharge home from  GI standpoint if no                            further bleeding or hemodynamic instability.                           - Repeat Hgb in 1 week                           - Follow up in GI office and if continues to have                            recurrent episodes of melena, may consider small                            bowel video capsule to exclude bleeding from small                            bowel                           - We will sign off, available for any questions. Procedure Code(s):        --- Professional ---                           [phone], Esophagogastroduodenoscopy, flexible,                            transoral; diagnostic, including collection of                            specimen(s) by brushing or washing, when performed                            (separate procedure) Diagnosis Code(s):        --- Professional ---                           K21.0, Gastro-esophageal reflux disease with                            esophagitis                           K31.89, Other diseases of stomach and duodenum                           K92.2, Gastrointestinal hemorrhage, unspecified CPT copyright 2017 American Medical Association. All rights reserved. The codes documented in this report are preliminary and upon coder review may  be revised to meet current compliance requirements. Mauri Pole, MD 10/24/2017 12:47:54 PM This report has been signed electronically. Number of Addenda: 0

## 2017-10-25 ENCOUNTER — Inpatient Hospital Stay (HOSPITAL_COMMUNITY): Payer: Medicare Other

## 2017-10-25 DIAGNOSIS — K921 Melena: Secondary | ICD-10-CM

## 2017-10-25 DIAGNOSIS — R531 Weakness: Secondary | ICD-10-CM

## 2017-10-25 DIAGNOSIS — K648 Other hemorrhoids: Secondary | ICD-10-CM

## 2017-10-25 DIAGNOSIS — R42 Dizziness and giddiness: Secondary | ICD-10-CM

## 2017-10-25 LAB — CBC
HEMATOCRIT: 42.2 % (ref 39.0–52.0)
Hemoglobin: 14.1 g/dL (ref 13.0–17.0)
MCH: 32.9 pg (ref 26.0–34.0)
MCHC: 33.4 g/dL (ref 30.0–36.0)
MCV: 98.4 fL (ref 78.0–100.0)
Platelets: 186 10*3/uL (ref 150–400)
RBC: 4.29 MIL/uL (ref 4.22–5.81)
RDW: 14.6 % (ref 11.5–15.5)
WBC: 7.8 10*3/uL (ref 4.0–10.5)

## 2017-10-25 MED ORDER — SACUBITRIL-VALSARTAN 49-51 MG PO TABS
1.0000 | ORAL_TABLET | Freq: Two times a day (BID) | ORAL | Status: DC
  Administered 2017-10-25 – 2017-10-28 (×7): 1 via ORAL
  Filled 2017-10-25 (×7): qty 1

## 2017-10-25 MED ORDER — FUROSEMIDE 20 MG PO TABS
20.0000 mg | ORAL_TABLET | Freq: Every day | ORAL | Status: DC
  Administered 2017-10-26 – 2017-10-28 (×3): 20 mg via ORAL
  Filled 2017-10-25 (×4): qty 1

## 2017-10-25 MED ORDER — PANTOPRAZOLE SODIUM 40 MG PO TBEC
40.0000 mg | DELAYED_RELEASE_TABLET | Freq: Every day | ORAL | Status: DC
  Administered 2017-10-25 – 2017-10-28 (×3): 40 mg via ORAL
  Filled 2017-10-25 (×3): qty 1

## 2017-10-25 NOTE — Progress Notes (Signed)
Pt. Refused evening dose of furosemide, due to, the preference of taking it in the morning because it is more convenient for him.

## 2017-10-25 NOTE — Progress Notes (Signed)
Discussed bag of home supply medications in room. Pt stated that he would not allow medications to go to the pharmacy. Pt made aware that medications would be safe in pharmacy. Pt was not agreeable. Pt stated that he agrees not to take medications. Pt also made aware that should he not adhere to agreement, medication duplication could be harmful. Pt then stated that he would have a friend come to get medications.

## 2017-10-25 NOTE — Progress Notes (Deleted)
Pt. Refused evening dose of furosemide, due to, the preference of taking it in the morning because it is more convient for him.

## 2017-10-25 NOTE — Progress Notes (Signed)
PROGRESS NOTE    Norman Rodriguez  ZOX:[id number] DOB: [birth year] DOA: 10/22/2017 PCP: Medicine, [facility]    Brief Narrative:  Norman Rodriguez is a 62 y.o. male with medical history significant of coronary artery disease status post bare-metal stent to LAD and occluded RCA with left-to-right collaterals, paroxysmal atrial fibrillation status post multiple cardioversions on rivaroxaban, DVT on rivaroxaban, chronic systolic heart failure with EF of 30 to 35% by echo on 03/21/2017, hypertension, hyperlipidemia, NSVT, restrictive lung disease/COPD who comes in with generalized weakness and orthopnea as well as black tarry stools. He was admitted for GI bleed.  Assessment & Plan:   Active Problems:   Atrial fibrillation (HCC)   CAD (coronary artery disease)   Chronic anticoagulation   NSVT (nonsustained ventricular tachycardia) (HCC)   Hyperlipidemia   Obesity   HTN (hypertension)   Chronic systolic heart failure (HCC)   PAF (paroxysmal atrial fibrillation) (HCC)   Dyspnea and respiratory abnormality   GI bleeding   COPD (chronic obstructive pulmonary disease) ([place])   Melena   Malena:  Suspect upper GI bleed.  H&H appear to remain stable.  GI consulted, , underwent EGD on 9/30  showing esophagitis.  He was cleared to start xarelto yesterday and he got a dose last night, but this am pt had 2 bloody bowel movements with some pain. Suspect he has hemorrhoids. Though,he did not have colonoscopy in the past.  We held the xarelto today and requested GI follow up .  His H&H remains stable. Resume PPI daily.     Paroxysmal atrial fibrillation:  Rate controlled with coreg . xarelto on hold today.    Chronic systolic heart failure:  He appears euvolemic.  Lasix restarted.   Hypertension:  Well controlled.    COPD: NO wheezing heard on exam.    Dizziness : persistent .   Unclear etiology.  Orthostatics negative.  Vestibular evaluation by PT.  He had  recent MRI brain which did not have acute abnormality.  Will get carotid duplex.    Some dyspnea earlier this am.  With dry cough occasionally.  Will CXR for further evaluation.      DVT prophylaxis: scd's Code Status: full code.  Family Communication: none at bedside.  Disposition Plan: pending GI evaluation.    Consultants:   Gastroenterology Velora Heckler.     Procedures:EGD on 9/30    Antimicrobials: none.    Subjective: 2 bloody bowel movements and some abdominal pain.  Persistent dizziness.    Objective: Vitals:   10/25/17 0808 10/25/17 0810 10/25/17 0945 10/25/17 1408  BP:   (!) 148/97 134/82  Pulse:   89 89  Resp:   18 19  Temp:    (!) 97.4 F (36.3 C)  TempSrc:    Oral  SpO2: 97% 97% 94% 96%  Weight:      Height:        Intake/Output Summary (Last 24 hours) at 10/25/2017 1512 Last data filed at 10/25/2017 1100 Gross per 24 hour  Intake 360 ml  Output 1850 ml  Net -1490 ml   Filed Weights   10/22/17 1355 10/22/17 1902 10/24/17 1122  Weight: 125.2 kg 118.8 kg 118.8 kg    Examination:  General exam: calm and in no distress.  Respiratory system: good air entry bilateral. No wheezing or rhonchi.  Cardiovascular system: S1 & S2 heard, RRR. No JVD, murmurs,. Gastrointestinal system: Abdomen is soft non tender non distended bowel sounds good.  Central nervous system: Alert and oriented.  Non focal.  Extremities: Symmetric 5 x 5 power. Skin: No rashes, lesions or ulcers Psychiatry:. Mood & affect appropriate.     Data Reviewed: I have personally reviewed following labs and imaging studies  CBC: Recent Labs  Lab 10/22/17 1358 10/22/17 1845 10/23/17 0023 10/23/17 0641 10/23/17 1249 10/25/17 0421  WBC 7.0  --   --  5.0  --  7.8  HGB 15.4  --   --  14.7  --  14.1  HCT 44.8 45.5 43.4 43.7 44.6 42.2  MCV 96.6  --   --  98.0  --  98.4  PLT 198  --   --  184  --  846   Basic Metabolic Panel: Recent Labs  Lab 10/22/17 1358 10/23/17 0641  NA  139 142  K 4.5 3.9  CL 105 106  CO2 26 28  GLUCOSE 123* 107*  BUN 16 13  CREATININE 1.00 1.03  CALCIUM 9.2 8.8*   GFR: Estimated Creatinine Clearance: 89.4 mL/min (by C-G formula based on SCr of 1.03 mg/dL). Liver Function Tests: Recent Labs  Lab 10/22/17 1358  AST 30  ALT 35  ALKPHOS 41  BILITOT 1.3*  PROT 7.0  ALBUMIN 3.9   No results for input(s): LIPASE, AMYLASE in the last 168 hours. No results for input(s): AMMONIA in the last 168 hours. Coagulation Profile: Recent Labs  Lab 10/22/17 1502  INR 1.26   Cardiac Enzymes: No results for input(s): CKTOTAL, CKMB, CKMBINDEX, TROPONINI in the last 168 hours. BNP (last 3 results) Recent Labs    02/28/17 1621  PROBNP 2,250*   HbA1C: No results for input(s): HGBA1C in the last 72 hours. CBG: No results for input(s): GLUCAP in the last 168 hours. Lipid Profile: No results for input(s): CHOL, HDL, LDLCALC, TRIG, CHOLHDL, LDLDIRECT in the last 72 hours. Thyroid Function Tests: No results for input(s): TSH, T4TOTAL, FREET4, T3FREE, THYROIDAB in the last 72 hours. Anemia Panel: No results for input(s): VITAMINB12, FOLATE, FERRITIN, TIBC, IRON, RETICCTPCT in the last 72 hours. Sepsis Labs: No results for input(s): PROCALCITON, LATICACIDVEN in the last 168 hours.  No results found for this or any previous visit (from the past 240 hour(s)).       Radiology Studies: No results found.      Scheduled Meds: . atorvastatin  40 mg Oral q1800  . B-complex with vitamin C  1 tablet Oral Daily  . carvedilol  12.5 mg Oral BID WC  . cholecalciferol  1,000 Units Oral Daily  . loratadine  10 mg Oral Daily  . magnesium oxide  400 mg Oral Daily  . multivitamin with minerals  1 tablet Oral Daily  . umeclidinium-vilanterol  1 puff Inhalation Daily  . vitamin C  500 mg Oral Daily   Continuous Infusions:    LOS: 3 days    Time spent: 35 minutes.     Hosie Poisson, MD [facility] Pager [phone]   If  7PM-7AM, please contact night-coverage [email or web address] Password [email or web address] 10/25/2017, 3:12 PM

## 2017-10-25 NOTE — Progress Notes (Signed)
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 Pt reports having mild swelling in joints with soy products. Pt reports dietary stated grilled chicken, bread are not available for diet. Pt reports eating grilled chicken, fried chicken and bread on a regular basis without allergic reaction.

## 2017-10-25 NOTE — Progress Notes (Signed)
Bilateral carotid duplex completed - Preliminary results - 1% to 39% ICA stenosis. Vertebral artery flow is antegrade. [facility], [place] 10/25/2017, 2:28 PM

## 2017-10-25 NOTE — Progress Notes (Signed)
OT Cancellation Note  Patient Details Name: FEDERICK LEVENE MRN: [id number] DOB: 28-Nov-1955   Cancelled Treatment:    Reason Eval/Treat Not Completed: Patient at procedure or test/ unavailable  Kenric,Lossie Kalp 10/25/2017, 2:52 PM  Lesle Chris, OTR/L Acute Rehabilitation Services [phone] [facility] pager [phone] office 10/25/2017

## 2017-10-25 NOTE — Care Management Important Message (Signed)
Important Message  Patient Details  Name: Norman Rodriguez MRN: [id number] Date of Birth: 09-06-1955   Medicare Important Message Given:  Yes    Kerin Salen 10/25/2017, 12:41 [place] Message  Patient Details  Name: Norman Rodriguez MRN: [id number] Date of Birth: 1955-11-01   Medicare Important Message Given:  Yes    Kerin Salen 10/25/2017, 12:41 PM

## 2017-10-25 NOTE — Progress Notes (Addendum)
[facility] Progress Note   Chief Complaint:   GI bleeding   SUBJECTIVE:   episode of painless rectal bleeding around 9:30 am . A second episode a couple of hours later. No pain, just mild bloating   ASSESSMENT AND PLAN:   62 yo male with significant cardiac history including CAD s/p BMS, PAF on Xarelto s/p multiple cardioversions, and chronic systolic heart failure. Patient admitted several days ago with melena / BRBPR. Inpatient EGD yesterday basically unrevealing. Restarted Xarelto last evening. We have been called back for recurrent GI bleeding. Describes brown stool with red blood. Overall bleeding low volume and possibly hemorrhoidal.  -Baseline hgb ~ 16, it was 14.7 yesterday, down to 14.1 this am (prior to recurrent bleeding).   -Negative Cologuard last year.  -Will do DRE / anoscopy in a little while - need to get scope from office.   -just ate lunch, I asked him not to eat anything until rectal exam done and recommendations made  OBJECTIVE:     Vital signs in last 24 hours: Temp:  [98.4 F (36.9 C)-99.3 F (37.4 C)] 99.3 F (37.4 C) (10/01 0613) Pulse Rate:  [78-107] 89 (10/01 0945) Resp:  [18-20] 18 (10/01 0945) BP: (128-148)/(86-97) 148/97 (10/01 0945) SpO2:  [93 %-97 %] 94 % (10/01 0945) Last BM Date: 10/24/17 General:   Alert, male in NAD EENT:  Normal hearing, non icteric sclera, conjunctive pink.  Heart:  Regular rate and rhythm;  Pulm: Normal respiratory effort, lungs CTA bilaterally without wheezes or crackles. Abdomen:  Soft, nondistended, nontender.  Normal bowel sounds, no masses felt.     Neurologic:  Alert and  oriented x4;  grossly normal neurologically. Psych:  Pleasant, cooperative.  Normal mood and affect.   Intake/Output from previous day: 09/30 0701 - 10/01 0700 In: 768 [P.O.:360; I.V.:408] Out: 1500 [Urine:1500] Intake/Output this shift: Total I/O In: -  Out: 1000 [Urine:1000]  Lab Results: Recent Labs     10/22/17 1358  10/23/17 0641 10/23/17 1249 10/25/17 0421  WBC 7.0  --  5.0  --  7.8  HGB 15.4  --  14.7  --  14.1  HCT 44.8   < > 43.7 44.6 42.2  PLT 198  --  184  --  186   < > = values in this interval not displayed.   BMET Recent Labs    10/22/17 1358 10/23/17 0641  NA 139 142  K 4.5 3.9  CL 105 106  CO2 26 28  GLUCOSE 123* 107*  BUN 16 13  CREATININE 1.00 1.03  CALCIUM 9.2 8.8*   LFT Recent Labs    10/22/17 1358  PROT 7.0  ALBUMIN 3.9  AST 30  ALT 35  ALKPHOS 41  BILITOT 1.3*   PT/INR Recent Labs    10/22/17 1502  LABPROT 15.7*  INR 1.26      LOS: 3 days   Tye Savoy ,NP 10/25/2017, 1:21 PM    Attending physician's note   I have taken an interval history, reviewed the chart and examined the patient. I agree with the Advanced Practitioner's note, impression and recommendations.  Patient is having blood per rectum.  He was having brown stool along with bright red blood, suspicious for possible hemorrhage from internal hemorrhoids. On digital rectal exam noted blood clot on blood finger and on anoscopy had few clots of blood in the rectal vault. He will need colonoscopy for further evaluation and identify the source of bleeding as it appears to  be coming from area more proximal than that can be identified with the anoscope Will need to hold Xarelto Start clear liquids and bowel prep tomorrow The risks and benefits as well as alternatives of endoscopic procedure(s) have been discussed and reviewed. All questions answered. The patient agrees to proceed.   Damaris Hippo , MD [phone]

## 2017-10-25 NOTE — Progress Notes (Signed)
Patient had mix of bright red blood and tarry stool this morning. Pt is very anxious and complain of SOB. VS remain stable. Will continue to monitor.

## 2017-10-25 NOTE — Evaluation (Signed)
Occupational Therapy and Vestibular Evaluation Patient Details Name: Norman Rodriguez MRN: [id number] DOB: 22-Jul-1955 Today's Date: 10/25/2017    History of Present Illness 62 y.o.malewith medical history significant ofcoronary artery disease status post bare-metal stent to LAD and occluded RCA with left-to-right collaterals, paroxysmal atrial fibrillation status post multiple cardioversions on rivaroxaban, DVT on rivaroxaban, chronic systolic heart failure with EF of 30 to 35% by echo on 03/21/2017, hypertension, hyperlipidemia, NSVT, restrictive lung disease/COPD who comes in with generalized weakness and orthopnea as well as black tarry stools.   Clinical Impression   Pt was admitted for the above. Completed vestibular eval:  See below.  No vestibular problem identified. Pt was able to get up and move around room at supervision level without LOB. Will follow in acute setting with mod I level goals.     Follow Up Recommendations  No OT follow up    Equipment Recommendations  (recommended shower seat--pt declines)    Recommendations for Other Services       Precautions / Restrictions Precautions Precautions: Fall Precaution Comments: pt denies h/o falls Restrictions Weight Bearing Restrictions: No      Mobility Bed Mobility Overal bed mobility: Modified Independent             General bed mobility comments: with rail, reports increased dizziness and that his "eyes aren't right" with supine to sit, no nystagmus noted  Transfers Overall transfer level: Modified independent                   Balance Overall balance assessment: Needs assistance   Sitting balance-Leahy Scale: Good         Standing balance comment: at least fair                           ADL either performed or assessed with clinical judgement   ADL Overall ADL's : Needs assistance/impaired                                       General ADL Comments: Pt is  mostly at a supervision/set up level for adls. Ambulated around room without LOB.  Talked to pt about general safety:  seat in shower, which he states won't fit; vs sitting on toilet and completing bathing from sink if any dizziness. Sitting to don pants     Vision         Perception     Praxis      Pertinent Vitals/Pain Pain Assessment: No/denies pain     Hand Dominance     Extremity/Trunk Assessment Upper Extremity Assessment Upper Extremity Assessment: Overall WFL for tasks assessed   Lower Extremity Assessment Lower Extremity Assessment: Overall WFL for tasks assessed(h/o numbness R foot 2* prior injury and surgery)   Cervical / Trunk Assessment Cervical / Trunk Assessment: Normal(pt reports increased forehead pressure with active cervical rotation to L and R)   Communication Communication Communication: No difficulties   Cognition Arousal/Alertness: Awake/alert Behavior During Therapy: WFL for tasks assessed/performed Overall Cognitive Status: Within Functional Limits for tasks assessed                                   VESTIBULAR:  General Comments  performed limited vestibular eval due to pt's c/o dizziness. He has a hard time describing it  but says he feels "swimmy headed" and off balance when he closes his eyes in shower and that vision is not quite as sharp.  Of note, he had one cataract replaced and is scheduled for the other later this month.  Did not look at BPPV as pt reports no difficulty rolling to either side nor looking up/down.  Most of symptoms are when he is up moving around.  Of note, orthostatics were taken yesterday and WFLs.  Pt did not have nystagmus at rest, during gaze holding, VOR, head shaking and did not refixate with head thrust.Smooth pursuits WFLs  Did not see sign of vestibular problem    Exercises     Shoulder Instructions      Home Living Family/patient expects to be discharged to:: Private residence Living Arrangements:  Other (Comment)roommates           Home Layout: Two level Alternate Level Stairs-Number of Steps: flight to room in lower level Alternate Level Stairs-Rails: Right Bathroom Shower/Tub: Occupational psychologist: Standard     Home Equipment: None   Additional Comments: pt states there is not enough room in shower for seat      Prior Functioning/Environment Level of Independence: Independent        Comments: doesn't drive, independent with ADLs        OT Problem List: Decreased strength;Decreased activity tolerance;Decreased knowledge of use of DME or AE      OT Treatment/Interventions: Self-care/ADL training;DME and/or AE instruction;Energy conservation;Patient/family education    OT Goals(Current goals can be found in the care plan section) Acute Rehab OT Goals Patient Stated Goal: not have any more bleeding OT Goal Formulation: With patient Time For Goal Achievement: 11/08/17 Potential to Achieve Goals: Good ADL Goals Pt Will Transfer to Toilet: with modified independence;regular height toilet Pt Will Perform Tub/Shower Transfer: Shower transfer;ambulating;with modified independence Additional ADL Goal #1: pt will gather adl items and complete adl at mod i level  OT Frequency: Min 2X/week   Barriers to D/C:            Co-evaluation              AM-PAC PT "6 Clicks" Daily Activity     Outcome Measure Help from another person eating meals?: None Help from another person taking care of personal grooming?: A Little Help from another person toileting, which includes using toliet, bedpan, or urinal?: A Little Help from another person bathing (including washing, rinsing, drying)?: A Little Help from another person to put on and taking off regular upper body clothing?: A Little Help from another person to put on and taking off regular lower body clothing?: A Little 6 Click Score: 19   End of Session    Activity Tolerance: Patient tolerated  treatment well Patient left: in bed;with call bell/phone within reach  OT Visit Diagnosis: Muscle weakness (generalized) (M62.81)                Time: 3300-7622 OT Time Calculation (min): 29 min Charges:  OT General Charges $OT Visit: 1 Visit OT Evaluation $OT Eval Low Complexity: 1 Low OT Treatments $Self Care/Home Management : 8-22 mins  Lesle Chris, OTR/L Acute Rehabilitation Services [phone] [facility] pager [phone] office 10/25/2017  [place] 10/25/2017, 4:20 PM

## 2017-10-25 NOTE — Evaluation (Signed)
Physical Therapy Evaluation Patient Details Name: Norman Rodriguez MRN: [id number] DOB: 1955/08/27 Today's Date: 10/25/2017   History of Present Illness  62 y.o.malewith medical history significant ofcoronary artery disease status post bare-metal stent to LAD and occluded RCA with left-to-right collaterals, paroxysmal atrial fibrillation status post multiple cardioversions on rivaroxaban, DVT on rivaroxaban, chronic systolic heart failure with EF of 30 to 35% by echo on 03/21/2017, hypertension, hyperlipidemia, NSVT, restrictive lung disease/COPD who comes in with generalized weakness and orthopnea as well as black tarry stools.  Clinical Impression  Pt admitted with above diagnosis. Pt currently with functional limitations due to the deficits listed below (see PT Problem List). Pt reports 3-4 week h/o dizziness, which he reports is constant, and does seem to increase with position changes. Orthostatics negative. OT consult placed for vestibular eval. Noted carotid dopplers pending.  Pt unable to ambulate at present 2* dizziness in sitting.  Pt will benefit from skilled PT to increase their independence and safety with mobility to allow discharge to the venue listed below.       Follow Up Recommendations Other (comment)(TBD, depending on progress); possibly outpatient vestibular PT    Equipment Recommendations  None recommended by PT    Recommendations for Other Services       Precautions / Restrictions Precautions Precautions: Fall Precaution Comments: pt denies h/o falls Restrictions Weight Bearing Restrictions: No      Mobility  Bed Mobility Overal bed mobility: Modified Independent             General bed mobility comments: with rail, reports increased dizziness and that his "eyes aren't right" with supine to sit, no nystagmus noted  Transfers                 General transfer comment: deferred 2* dizziness in sitting  Ambulation/Gait                 Stairs            Wheelchair Mobility    Modified Rankin (Stroke Patients Only)       Balance Overall balance assessment: Needs assistance   Sitting balance-Leahy Scale: Good         Standing balance comment: NT -2* dizziness in sitting                             Pertinent Vitals/Pain      Home Living Family/patient expects to be discharged to:: Private residence Living Arrangements: Other (Comment)(roommates)           Home Layout: Two level Home Equipment: None      Prior Function Level of Independence: Independent         Comments: doesn't drive, independent with ADLs     Hand Dominance        Extremity/Trunk Assessment   Upper Extremity Assessment Upper Extremity Assessment: Defer to OT evaluation    Lower Extremity Assessment Lower Extremity Assessment: Overall WFL for tasks assessed(h/o numbness R foot 2* prior injury and surgery)    Cervical / Trunk Assessment Cervical / Trunk Assessment: Normal(pt reports increased forehead pressure with active cervical rotation to L and R)  Communication   Communication: No difficulties  Cognition Arousal/Alertness: Awake/alert Behavior During Therapy: WFL for tasks assessed/performed Overall Cognitive Status: Within Functional Limits for tasks assessed  General Comments General comments (skin integrity, edema, etc.): noted orthostatics were assessed yesterday and were negative    Exercises     Assessment/Plan    PT Assessment Patient needs continued PT services  PT Problem List Decreased mobility;Decreased activity tolerance       PT Treatment Interventions Gait training;Therapeutic activities    PT Goals (Current goals can be found in the Care Plan section)  Acute Rehab PT Goals Patient Stated Goal: get rid of dizziness PT Goal Formulation: With patient Time For Goal Achievement: 11/08/17 Potential to Achieve  Goals: Good    Frequency Min 3X/week   Barriers to discharge        Co-evaluation               AM-PAC PT "6 Clicks" Daily Activity  Outcome Measure Difficulty turning over in bed (including adjusting bedclothes, sheets and blankets)?: A Little Difficulty moving from lying on back to sitting on the side of the bed? : A Little Difficulty sitting down on and standing up from a chair with arms (e.g., wheelchair, bedside commode, etc,.)?: A Little Help needed moving to and from a bed to chair (including a wheelchair)?: A Lot Help needed walking in hospital room?: A Lot Help needed climbing 3-5 steps with a railing? : A Lot 6 Click Score: 15    End of Session   Activity Tolerance: Treatment limited secondary to medical complications (Comment)(dizziness) Patient left: in bed;with call bell/phone within reach Nurse Communication: Mobility status PT Visit Diagnosis: Unsteadiness on feet (R26.81)    Time: 6546-5035 PT Time Calculation (min) (ACUTE ONLY): 24 min   Charges:   PT Evaluation $PT Eval Low Complexity: 1 Low PT Treatments $Therapeutic Activity: 8-22 mins        Blondell Reveal Kistler PT 10/25/2017  Acute Rehabilitation Services Pager [phone] Office [phone]

## 2017-10-25 NOTE — Progress Notes (Addendum)
Pt. Has taken a dose of Entresto from personal medication, not from pharmacy. Pt. also disclosed that he took a morning dose as well.

## 2017-10-25 NOTE — H&P (View-Only) (Signed)
[facility] Progress Note   Chief Complaint:   GI bleeding   SUBJECTIVE:   episode of painless rectal bleeding around 9:30 am . A second episode a couple of hours later. No pain, just mild bloating   ASSESSMENT AND PLAN:   62 yo male with significant cardiac history including CAD s/p BMS, PAF on Xarelto s/p multiple cardioversions, and chronic systolic heart failure. Patient admitted several days ago with melena / BRBPR. Inpatient EGD yesterday basically unrevealing. Restarted Xarelto last evening. We have been called back for recurrent GI bleeding. Describes brown stool with red blood. Overall bleeding low volume and possibly hemorrhoidal.  -Baseline hgb ~ 16, it was 14.7 yesterday, down to 14.1 this am (prior to recurrent bleeding).   -Negative Cologuard last year.  -Will do DRE / anoscopy in a little while - need to get scope from office.   -just ate lunch, I asked him not to eat anything until rectal exam done and recommendations made  OBJECTIVE:     Vital signs in last 24 hours: Temp:  [98.4 F (36.9 C)-99.3 F (37.4 C)] 99.3 F (37.4 C) (10/01 0613) Pulse Rate:  [78-107] 89 (10/01 0945) Resp:  [18-20] 18 (10/01 0945) BP: (128-148)/(86-97) 148/97 (10/01 0945) SpO2:  [93 %-97 %] 94 % (10/01 0945) Last BM Date: 10/24/17 General:   Alert, male in NAD EENT:  Normal hearing, non icteric sclera, conjunctive pink.  Heart:  Regular rate and rhythm;  Pulm: Normal respiratory effort, lungs CTA bilaterally without wheezes or crackles. Abdomen:  Soft, nondistended, nontender.  Normal bowel sounds, no masses felt.     Neurologic:  Alert and  oriented x4;  grossly normal neurologically. Psych:  Pleasant, cooperative.  Normal mood and affect.   Intake/Output from previous day: 09/30 0701 - 10/01 0700 In: 768 [P.O.:360; I.V.:408] Out: 1500 [Urine:1500] Intake/Output this shift: Total I/O In: -  Out: 1000 [Urine:1000]  Lab Results: Recent Labs     10/22/17 1358  10/23/17 0641 10/23/17 1249 10/25/17 0421  WBC 7.0  --  5.0  --  7.8  HGB 15.4  --  14.7  --  14.1  HCT 44.8   < > 43.7 44.6 42.2  PLT 198  --  184  --  186   < > = values in this interval not displayed.   BMET Recent Labs    10/22/17 1358 10/23/17 0641  NA 139 142  K 4.5 3.9  CL 105 106  CO2 26 28  GLUCOSE 123* 107*  BUN 16 13  CREATININE 1.00 1.03  CALCIUM 9.2 8.8*   LFT Recent Labs    10/22/17 1358  PROT 7.0  ALBUMIN 3.9  AST 30  ALT 35  ALKPHOS 41  BILITOT 1.3*   PT/INR Recent Labs    10/22/17 1502  LABPROT 15.7*  INR 1.26      LOS: 3 days   Tye Savoy ,NP 10/25/2017, 1:21 PM    Attending physician's note   I have taken an interval history, reviewed the chart and examined the patient. I agree with the Advanced Practitioner's note, impression and recommendations.  Patient is having blood per rectum.  He was having brown stool along with bright red blood, suspicious for possible hemorrhage from internal hemorrhoids. On digital rectal exam noted blood clot on blood finger and on anoscopy had few clots of blood in the rectal vault. He will need colonoscopy for further evaluation and identify the source of bleeding as it appears to  be coming from area more proximal than that can be identified with the anoscope Will need to hold Xarelto Start clear liquids and bowel prep tomorrow The risks and benefits as well as alternatives of endoscopic procedure(s) have been discussed and reviewed. All questions answered. The patient agrees to proceed.   Damaris Hippo , MD [phone]

## 2017-10-26 DIAGNOSIS — K625 Hemorrhage of anus and rectum: Secondary | ICD-10-CM

## 2017-10-26 DIAGNOSIS — I5022 Chronic systolic (congestive) heart failure: Secondary | ICD-10-CM

## 2017-10-26 DIAGNOSIS — I480 Paroxysmal atrial fibrillation: Secondary | ICD-10-CM

## 2017-10-26 DIAGNOSIS — Z7901 Long term (current) use of anticoagulants: Secondary | ICD-10-CM

## 2017-10-26 DIAGNOSIS — J449 Chronic obstructive pulmonary disease, unspecified: Secondary | ICD-10-CM

## 2017-10-26 MED ORDER — POLYETHYLENE GLYCOL 3350 17 GM/SCOOP PO POWD
1.0000 | Freq: Once | ORAL | Status: AC
  Administered 2017-10-26: 255 g via ORAL
  Filled 2017-10-26: qty 255

## 2017-10-26 NOTE — Progress Notes (Addendum)
[facility] Progress Note   Chief Complaint:   Rectal bleeding   SUBJECTIVE:    no BMs nor rectal bleeding since before I saw him yesterday. Feels okay.    ASSESSMENT AND PLAN:   1. Painless lower GI bleeding on Xarelto. EGD negative. Blood clot in anal vault on anoscopy yesterday.  Overall hgb down ~ 2 grams this admission but still in 14 range. Negative Cologuard last year.  -Overall bleeding low volume and possibly hemorrhoidal but needs colonoscopy for further evaluation, especially given need for chronic anticoagulation.  -If no finding on colonoscopy and no further bleeding he could possibly be discharged following procedure  2. CAD s/p BMS, PAF on Xarelto s/p multiple cardioversions, and chronic systolic heart failure.    OBJECTIVE:     Vital signs in last 24 hours: Temp:  [97.4 F (36.3 C)-98.3 F (36.8 C)] 98.2 F (36.8 C) (10/02 0430) Pulse Rate:  [79-93] 89 (10/02 0430) Resp:  [18-20] 20 (10/02 0430) BP: (108-148)/(70-97) 108/70 (10/02 0430) SpO2:  [92 %-97 %] 92 % (10/02 0827) Last BM Date: 10/25/17 General:   Alert, white male in NAD EENT:  Normal hearing, non icteric sclera, conjunctive pink.  Heart:  Regular rate and rhythm;no lower extremity edema Pulm: Normal respiratory effor Abdomen:  Soft, nondistended, nontender.  Normal bowel sounds, no masses felt.     Neurologic:  Alert and  oriented x4;  grossly normal neurologically. Psych:  Pleasant, cooperative.  Normal mood and affect.  Review of systems: Denies chest pain or dyspnea  Intake/Output from previous day: 10/01 0701 - 10/02 0700 In: -  Out: 2000 [Urine:2000] Intake/Output this shift: No intake/output data recorded.  Lab Results: Recent Labs    10/23/17 1249 10/25/17 0421  WBC  --  7.8  HGB  --  14.1  HCT 44.6 42.2  PLT  --  186    Dg Chest 2 View  Result Date: 10/25/2017 CLINICAL DATA:  Bright red blood and tarry stool this am - with smoke SOB -hx afib -  chronic systolic CHF - hypertension - nonsustained ventricular tachycardia - former smoker EXAM: CHEST - 2 VIEW COMPARISON:  05/14/2013 FINDINGS: Cardiac silhouette is mildly enlarged. No mediastinal or hilar masses. No convincing adenopathy. There are prominent bronchovascular markings with mild interstitial thickening, most evident in the lower lungs. No lung consolidation. No pleural effusion or pneumothorax. Skeletal structures are intact. IMPRESSION: 1. Cardiomegaly with prominent bronchovascular markings and mild interstitial thickening. Findings suggest mild congestive heart failure. No evidence of pneumonia. Electronically Signed   By: Lajean Manes M.D.   On: 10/25/2017 19:28   Active Problems:   Atrial fibrillation (HCC)   CAD (coronary artery disease)   Chronic anticoagulation   NSVT (nonsustained ventricular tachycardia) (HCC)   Hyperlipidemia   Obesity   HTN (hypertension)   Chronic systolic heart failure (HCC)   PAF (paroxysmal atrial fibrillation) (HCC)   Dyspnea and respiratory abnormality   BRBPR (bright red blood per rectum)   COPD (chronic obstructive pulmonary disease) ([place])   Melena     LOS: 4 days   Tye Savoy ,NP 10/26/2017, 9:42 AM   I have discussed the case with the PA, and that is the plan I formulated. I personally interviewed and examined the patient.  He is hemodynamically stable with normal hemoglobin as of yesterday.  Oral anticoagulation has been held, vital signs stable.  The plan is for bowel preparation today and colonoscopy tomorrow with Dr. Silverio Decamp.  Increased risk  procedure due to multiple comorbidities, MAC sedation required.  Nelida Meuse III Office: [phone]

## 2017-10-26 NOTE — H&P (View-Only) (Signed)
   Norman Rodriguez Progress Note   Chief Complaint:   Rectal bleeding   SUBJECTIVE:    no BMs nor rectal bleeding since before I saw him yesterday. Feels okay.    ASSESSMENT AND PLAN:   1. Painless lower GI bleeding on Xarelto. EGD negative. Blood clot in anal vault on anoscopy yesterday.  Overall hgb down ~ 2 grams this admission but still in 14 range. Negative Cologuard last year.  -Overall bleeding low volume and possibly hemorrhoidal but needs colonoscopy for further evaluation, especially given need for chronic anticoagulation.  -If no finding on colonoscopy and no further bleeding he could possibly be discharged following procedure  2. CAD s/p BMS, PAF on Xarelto s/p multiple cardioversions, and chronic systolic heart failure.    OBJECTIVE:     Vital signs in last 24 hours: Temp:  [97.4 F (36.3 C)-98.3 F (36.8 C)] 98.2 F (36.8 C) (10/02 0430) Pulse Rate:  [79-93] 89 (10/02 0430) Resp:  [18-20] 20 (10/02 0430) BP: (108-148)/(70-97) 108/70 (10/02 0430) SpO2:  [92 %-97 %] 92 % (10/02 0827) Last BM Date: 10/25/17 General:   Alert, white male in NAD EENT:  Normal hearing, non icteric sclera, conjunctive pink.  Heart:  Regular rate and rhythm;no lower extremity edema Pulm: Normal respiratory effor Abdomen:  Soft, nondistended, nontender.  Normal bowel sounds, no masses felt.     Neurologic:  Alert and  oriented x4;  grossly normal neurologically. Psych:  Pleasant, cooperative.  Normal mood and affect.  Review of systems: Denies chest pain or dyspnea  Intake/Output from previous day: 10/01 0701 - 10/02 0700 In: -  Out: 2000 [Urine:2000] Intake/Output this shift: No intake/output data recorded.  Lab Results: Recent Labs    10/23/17 1249 10/25/17 0421  WBC  --  7.8  HGB  --  14.1  HCT 44.6 42.2  PLT  --  186    Dg Chest 2 View  Result Date: 10/25/2017 CLINICAL DATA:  Bright red blood and tarry stool this am - with smoke SOB -hx afib -  chronic systolic CHF - hypertension - nonsustained ventricular tachycardia - former smoker EXAM: CHEST - 2 VIEW COMPARISON:  05/14/2013 FINDINGS: Cardiac silhouette is mildly enlarged. No mediastinal or hilar masses. No convincing adenopathy. There are prominent bronchovascular markings with mild interstitial thickening, most evident in the lower lungs. No lung consolidation. No pleural effusion or pneumothorax. Skeletal structures are intact. IMPRESSION: 1. Cardiomegaly with prominent bronchovascular markings and mild interstitial thickening. Findings suggest mild congestive heart failure. No evidence of pneumonia. Electronically Signed   By: Norman  Rodriguez M.D.   On: 10/25/2017 19:28   Active Problems:   Atrial fibrillation (HCC)   CAD (coronary artery disease)   Chronic anticoagulation   NSVT (nonsustained ventricular tachycardia) (HCC)   Hyperlipidemia   Obesity   HTN (hypertension)   Chronic systolic heart failure (HCC)   PAF (paroxysmal atrial fibrillation) (HCC)   Dyspnea and respiratory abnormality   BRBPR (bright red blood per rectum)   COPD (chronic obstructive pulmonary disease) (HCC)   Melena     LOS: 4 days   Norman Rodriguez ,NP 10/26/2017, 9:42 AM   I have discussed the case with the PA, and that is the plan I formulated. I personally interviewed and examined the patient.  He is hemodynamically stable with normal hemoglobin as of yesterday.  Oral anticoagulation has been held, vital signs stable.  The plan is for bowel preparation today and colonoscopy tomorrow with Dr. Nandigam.  Increased risk   procedure due to multiple comorbidities, MAC sedation required.  Norman Rodriguez Office: [phone]       

## 2017-10-26 NOTE — Progress Notes (Signed)
PROGRESS NOTE [facility]   Norman Rodriguez   SEG:[id number] DOB: 12/16/1955  DOA: 10/22/2017 PCP: Medicine, [facility]   Brief Narrative:  Norman Rodriguez 62 year old male with medical history significant for CAD, PAF on Xarelto, DVT and chronic systolic CHF, hypertension, COPD/restrictive lung disease.  Patient presented to the emergency department complaining of generalized weakness, orthopnea and black tarry stools. Patient admitted with working diagnosis of GI bleed. GI was consulted, Xarelto was held, underwent EGD on 9/30 showed esophagitis.  Subsequently patient was cleared to resume Xarelto, eventually patient had 2 bloody bowel movement with pain.  GI was reconsulted and recommending colonoscopy.  Subjective: Patient seen and examined, has no further rectal bleeds.  No acute events overnight.  Hemoglobin remained stable.  Assessment & Plan: Lower GI bleed In setting of Xarelto, EGD was negative.  Blood clot in vaginal vault anoscopy by GI.  Hemoglobin done around 2 g this admission however still in 14 range.  GI recommending colonoscopy.  Patient will start bowel prep tonight and for colonoscopy tomorrow.  Continue to hold Xarelto.  Monitor H&H.  Proximal A. fib Rate well controlled with Coreg, continue to hold Xarelto  Dizziness Improve, unclear etiology.  Orthostatic negative.  MRI brain did not reveal acute abnormality.  Today walking around the room and in the hall with no signs of dizziness or vertigo.  Chronic systolic CHF EF of 73% on 2019 Seems to be euvolemic today, continue Lasix  Hypertension Blood pressure well controlled Continue current regimen.  COPD Stable  DVT prophylaxis: SCDs Code Status: Full code Family Communication: None at bedside Disposition Plan: Home in a.m. after colonoscopy no significant findings.  Consultants:   GI  Procedures:   EGD 9/30  Antimicrobials:  None   Objective: Vitals:   10/26/17  0825 10/26/17 0827 10/26/17 1027 10/26/17 1331  BP:   115/76 (!) 126/92  Pulse:   97 77  Resp:    18  Temp:    98.4 F (36.9 C)  TempSrc:    Oral  SpO2: 92% 92%  96%  Weight:      Height:        Intake/Output Summary (Last 24 hours) at 10/26/2017 1704 Last data filed at 10/26/2017 1048 Gross per 24 hour  Intake -  Output 400 ml  Net -400 ml   Filed Weights   10/22/17 1355 10/22/17 1902 10/24/17 1122  Weight: 125.2 kg 118.8 kg 118.8 kg    Examination:  General: Pt is alert, awake, not in acute distress Cardiovascular: RRR, S1/S2 +, no rubs, no gallops Respiratory: CTA bilaterally, no wheezing, no rhonchi Abdominal: Soft, NT, ND, bowel sounds + Extremities: no edema, no cyanosis   Data Reviewed: I have personally reviewed following labs and imaging studies  CBC: Recent Labs  Lab 10/22/17 1358 10/22/17 1845 10/23/17 0023 10/23/17 0641 10/23/17 1249 10/25/17 0421  WBC 7.0  --   --  5.0  --  7.8  HGB 15.4  --   --  14.7  --  14.1  HCT 44.8 45.5 43.4 43.7 44.6 42.2  MCV 96.6  --   --  98.0  --  98.4  PLT 198  --   --  184  --  710   Basic Metabolic Panel: Recent Labs  Lab 10/22/17 1358 10/23/17 0641  NA 139 142  K 4.5 3.9  CL 105 106  CO2 26 28  GLUCOSE 123* 107*  BUN 16 13  CREATININE 1.00 1.03  CALCIUM  9.2 8.8*   GFR: Estimated Creatinine Clearance: 89.4 mL/min (by C-G formula based on SCr of 1.03 mg/dL). Liver Function Tests: Recent Labs  Lab 10/22/17 1358  AST 30  ALT 35  ALKPHOS 41  BILITOT 1.3*  PROT 7.0  ALBUMIN 3.9   No results for input(s): LIPASE, AMYLASE in the last 168 hours. No results for input(s): AMMONIA in the last 168 hours. Coagulation Profile: Recent Labs  Lab 10/22/17 1502  INR 1.26   Cardiac Enzymes: No results for input(s): CKTOTAL, CKMB, CKMBINDEX, TROPONINI in the last 168 hours. BNP (last 3 results) Recent Labs    02/28/17 1621  PROBNP 2,250*   HbA1C: No results for input(s): HGBA1C in the last 72  hours. CBG: No results for input(s): GLUCAP in the last 168 hours. Lipid Profile: No results for input(s): CHOL, HDL, LDLCALC, TRIG, CHOLHDL, LDLDIRECT in the last 72 hours. Thyroid Function Tests: No results for input(s): TSH, T4TOTAL, FREET4, T3FREE, THYROIDAB in the last 72 hours. Anemia Panel: No results for input(s): VITAMINB12, FOLATE, FERRITIN, TIBC, IRON, RETICCTPCT in the last 72 hours. Sepsis Labs: No results for input(s): PROCALCITON, LATICACIDVEN in the last 168 hours.  No results found for this or any previous visit (from the past 240 hour(s)).    Radiology Studies: Dg Chest 2 View  Result Date: 10/25/2017 CLINICAL DATA:  Bright red blood and tarry stool this am - with smoke SOB -hx afib - chronic systolic CHF - hypertension - nonsustained ventricular tachycardia - former smoker EXAM: CHEST - 2 VIEW COMPARISON:  05/14/2013 FINDINGS: Cardiac silhouette is mildly enlarged. No mediastinal or hilar masses. No convincing adenopathy. There are prominent bronchovascular markings with mild interstitial thickening, most evident in the lower lungs. No lung consolidation. No pleural effusion or pneumothorax. Skeletal structures are intact. IMPRESSION: 1. Cardiomegaly with prominent bronchovascular markings and mild interstitial thickening. Findings suggest mild congestive heart failure. No evidence of pneumonia. Electronically Signed   By: Lajean Manes M.D.   On: 10/25/2017 19:28      Scheduled Meds: . atorvastatin  40 mg Oral q1800  . B-complex with vitamin C  1 tablet Oral Daily  . carvedilol  12.5 mg Oral BID WC  . cholecalciferol  1,000 Units Oral Daily  . furosemide  20 mg Oral Daily  . loratadine  10 mg Oral Daily  . magnesium oxide  400 mg Oral Daily  . multivitamin with minerals  1 tablet Oral Daily  . pantoprazole  40 mg Oral Q0600  . polyethylene glycol powder  1 Container Oral Once  . sacubitril-valsartan  1 tablet Oral BID  . umeclidinium-vilanterol  1 puff  Inhalation Daily  . vitamin C  500 mg Oral Daily   Continuous Infusions:   LOS: 4 days    Time spent: Total of 25 minutes spent with pt, greater than 50% of which was spent in discussion of  treatment, counseling and coordination of care   Chipper Oman, MD Pager: Text Page via [email or web address]   If 7PM-7AM, please contact night-coverage [email or web address] 10/26/2017, 5:04 PM   Note - This record has been created using [employer]. Chart creation errors have been sought, but may not always have been located. Such creation errors do not reflect on the standard of medical care.

## 2017-10-26 NOTE — Progress Notes (Signed)
Occupational Therapy Treatment Patient Details Name: Norman Rodriguez MRN: [id number] DOB: Feb 25, 1955 Today's Date: 10/26/2017    History of present illness 62 y.o.malewith medical history significant ofcoronary artery disease status post bare-metal stent to LAD and occluded RCA with left-to-right collaterals, paroxysmal atrial fibrillation status post multiple cardioversions on rivaroxaban, DVT on rivaroxaban, chronic systolic heart failure with EF of 30 to 35% by echo on 03/21/2017, hypertension, hyperlipidemia, NSVT, restrictive lung disease/COPD who comes in with generalized weakness and orthopnea as well as black tarry stools.   OT comments  Pt moving around room and steady:  Mod I for adls. 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  No further OT needs identified  Follow Up Recommendations  No OT follow up    Equipment Recommendations  None recommended by OT    Recommendations for Other Services      Precautions / Restrictions Precautions Precautions: Fall Precaution Comments: pt denies h/o falls Restrictions Weight Bearing Restrictions: No       Mobility Bed Mobility                  Transfers Overall transfer level: Modified independent                    Balance                                           ADL either performed or assessed with clinical judgement   ADL                                         General ADL Comments: issued long reacher for use to retrieve items and to don pants from sitting.  Pt moving around room at mod I level. Denies dizziness and no LOB.  Continued conversation of safety/energy conservation to return to PLOF. Pt has an old shower seat at home, but he states it won't fit in shower stall. He plans to get a non-slid mat.  He has a long sponge. Encouraged him to sit on toilet to wash feet rather than picking one legs up at a time until he feels back to baseline      Vision       Perception     Praxis      Cognition Arousal/Alertness: Awake/alert Behavior During Therapy: WFL for tasks assessed/performed Overall Cognitive Status: Within Functional Limits for tasks assessed                                          Exercises     Shoulder Instructions       General Comments      Pertinent Vitals/ Pain       Pain Assessment: No/denies pain  Home Living                                          Prior Functioning/Environment              Frequency  Min 2X/week        Progress Toward Goals  OT Goals(current  goals can now be found in the care plan section)  Progress towards OT goals: Progressing toward goals     Plan      Co-evaluation                 AM-PAC PT "6 Clicks" Daily Activity     Outcome Measure   Help from another person eating meals?: None Help from another person taking care of personal grooming?: None Help from another person toileting, which includes using toliet, bedpan, or urinal?: None Help from another person bathing (including washing, rinsing, drying)?: None Help from another person to put on and taking off regular upper body clothing?: None Help from another person to put on and taking off regular lower body clothing?: None 6 Click Score: 24    End of Session    OT Visit Diagnosis: Muscle weakness (generalized) (M62.81)   Activity Tolerance Patient tolerated treatment well   Patient Left in chair;with call bell/phone within reach   Nurse Communication          Time: 3559-7416 OT Time Calculation (min): 18 min  Charges: OT General Charges $OT Visit: 1 Visit OT Treatments $Self Care/Home Management : 8-22 mins  Lesle Chris, OTR/L Acute Rehabilitation Services [phone] [facility] pager [phone] office 10/26/2017   Lacey,Charolette Bultman 10/26/2017, 1:14 PM

## 2017-10-26 NOTE — Plan of Care (Signed)
  Problem: Coping: Goal: Level of anxiety will decrease Outcome: Progressing   Problem: Elimination: Goal: Will not experience complications related to bowel motility Outcome: Progressing Goal: Will not experience complications related to urinary retention Outcome: Progressing   

## 2017-10-27 ENCOUNTER — Encounter (HOSPITAL_COMMUNITY): Payer: Self-pay | Admitting: *Deleted

## 2017-10-27 ENCOUNTER — Encounter (HOSPITAL_COMMUNITY)
Admission: EM | Disposition: A | Discharge status: Home / Self Care | Payer: Self-pay | Source: Home / Self Care | Attending: Internal Medicine

## 2017-10-27 ENCOUNTER — Inpatient Hospital Stay (HOSPITAL_COMMUNITY): Payer: Medicare Other | Admitting: Anesthesiology

## 2017-10-27 DIAGNOSIS — D123 Benign neoplasm of transverse colon: Secondary | ICD-10-CM

## 2017-10-27 DIAGNOSIS — K635 Polyp of colon: Secondary | ICD-10-CM

## 2017-10-27 DIAGNOSIS — K621 Rectal polyp: Secondary | ICD-10-CM

## 2017-10-27 DIAGNOSIS — D124 Benign neoplasm of descending colon: Secondary | ICD-10-CM

## 2017-10-27 DIAGNOSIS — D120 Benign neoplasm of cecum: Secondary | ICD-10-CM

## 2017-10-27 HISTORY — PX: POLYPECTOMY: SHX5525

## 2017-10-27 HISTORY — PX: COLONOSCOPY WITH PROPOFOL: SHX5780

## 2017-10-27 LAB — CBC WITH DIFFERENTIAL/PLATELET
Basophils Absolute: 0.1 10*3/uL (ref 0.0–0.1)
Basophils Relative: 1 %
EOS ABS: 0.2 10*3/uL (ref 0.0–0.7)
Eosinophils Relative: 2 %
HEMATOCRIT: 42.2 % (ref 39.0–52.0)
HEMOGLOBIN: 14.5 g/dL (ref 13.0–17.0)
LYMPHS ABS: 1.4 10*3/uL (ref 0.7–4.0)
LYMPHS PCT: 22 %
MCH: 33.3 pg (ref 26.0–34.0)
MCHC: 34.4 g/dL (ref 30.0–36.0)
MCV: 97 fL (ref 78.0–100.0)
MONOS PCT: 16 %
Monocytes Absolute: 1.1 10*3/uL (ref 0.1–1.0)
Neutro Abs: 3.9 10*3/uL (ref 1.7–7.7)
Neutrophils Relative %: 59 %
Platelets: 219 10*3/uL (ref 150–400)
RBC: 4.35 MIL/uL (ref 4.22–5.81)
RDW: 14.2 % (ref 11.5–15.5)
WBC: 6.6 10*3/uL (ref 4.0–10.5)

## 2017-10-27 LAB — BASIC METABOLIC PANEL
Anion gap: 8 (ref 5–15)
BUN: 7 mg/dL — AB (ref 8–23)
CHLORIDE: 103 mmol/L (ref 98–111)
CO2: 29 mmol/L (ref 22–32)
CREATININE: 0.88 mg/dL (ref 0.61–1.24)
Calcium: 8.8 mg/dL — ABNORMAL LOW (ref 8.9–10.3)
GFR calc Af Amer: >60 mL/min (ref >60)
GFR calc non Af Amer: >60 mL/min (ref >60)
GLUCOSE: 120 mg/dL — AB (ref 70–99)
Potassium: 4 mmol/L (ref 3.5–5.1)
SODIUM: 140 mmol/L (ref 135–145)

## 2017-10-27 SURGERY — COLONOSCOPY WITH PROPOFOL
Anesthesia: Monitor Anesthesia Care

## 2017-10-27 MED ORDER — ONDANSETRON HCL 4 MG/2ML IJ SOLN
INTRAMUSCULAR | Status: DC | PRN
  Administered 2017-10-27: 4 mg via INTRAVENOUS

## 2017-10-27 MED ORDER — PROPOFOL 10 MG/ML IV BOLUS
INTRAVENOUS | Status: AC
  Filled 2017-10-27: qty 20

## 2017-10-27 MED ORDER — PROPOFOL 10 MG/ML IV BOLUS
INTRAVENOUS | Status: DC | PRN
  Administered 2017-10-27: 20 mg via INTRAVENOUS

## 2017-10-27 MED ORDER — BISACODYL 5 MG PO TBEC: 5.0000 mg | DELAYED_RELEASE_TABLET | Freq: Every day | ORAL | Status: DC | PRN

## 2017-10-27 MED ORDER — ACETAMINOPHEN 500 MG PO TABS: 1000.0000 mg | ORAL_TABLET | Freq: Four times a day (QID) | ORAL | Status: DC | PRN

## 2017-10-27 MED ORDER — PROPOFOL 500 MG/50ML IV EMUL
INTRAVENOUS | Status: DC | PRN
  Administered 2017-10-27: 200 ug/kg/min via INTRAVENOUS
  Administered 2017-10-27: 100 ug/kg/min via INTRAVENOUS

## 2017-10-27 MED ORDER — LACTATED RINGERS IV SOLN
INTRAVENOUS | Status: DC
  Administered 2017-10-27: 1000 mL via INTRAVENOUS

## 2017-10-27 MED ORDER — PROPOFOL 10 MG/ML IV BOLUS
INTRAVENOUS | Status: AC
  Filled 2017-10-27: qty 40

## 2017-10-27 SURGICAL SUPPLY — 22 items

## 2017-10-27 NOTE — Interval H&P Note (Signed)
History and Physical Interval Note:  10/27/2017 11:19 AM  Norman Rodriguez  has presented today for surgery, with the diagnosis of hematochezia  The various methods of treatment have been discussed with the patient and family. After consideration of risks, benefits and other options for treatment, the patient has consented to  Procedure(s): COLONOSCOPY WITH PROPOFOL (N/A) as a surgical intervention .  The patient's history has been reviewed, patient examined, no change in status, stable for surgery.  I have reviewed the patient's chart and labs.  Questions were answered to the patient's satisfaction.     Ruchama Kubicek

## 2017-10-27 NOTE — Progress Notes (Signed)
PT Cancellation Note  Patient Details Name: Norman Rodriguez MRN: [id number] DOB: 1955/05/19   Cancelled Treatment:    Reason Eval/Treat Not Completed: Patient at procedure or test/unavailable (Endo).  Pt recent notes, pt's mobility has improved and he is ambulating without dizziness.  Pt likely to d/c home later today.     Akyia Borelli,KATHrine E 10/27/2017, 10:08 AM Carmelia Bake, PT, DPT Acute Rehabilitation Services Office: [phone] Pager: [phone]

## 2017-10-27 NOTE — Anesthesia Procedure Notes (Signed)
Procedure Name: MAC Date/Time: 10/27/2017 11:40 AM Performed by: Lollie Sails, CRNA Pre-anesthesia Checklist: Patient identified, Emergency Drugs available, Suction available and Patient being monitored Oxygen Delivery Method: Simple face mask

## 2017-10-27 NOTE — Op Note (Signed)
[facility] Patient Name: Norman Rodriguez Procedure Date: 10/27/2017 MRN: [id number] Attending MD: Mauri Pole , MD Date of Birth: August 18, 1955 CSN: [id number] Age: 62 Admit Type: Inpatient Procedure:                Colonoscopy Indications:              Evaluation of unexplained GI bleeding Providers:                Mauri Pole, MD, William Dalton,                            Technician, Carmie End, RN Referring MD:              Medicines:                Monitored Anesthesia Care Complications:            No immediate complications. Estimated Blood Loss:     Estimated blood loss was minimal. Procedure:                Pre-Anesthesia Assessment:                           - Prior to the procedure, a History and Physical                            was performed, and patient medications and                            allergies were reviewed. The patient's tolerance of                            previous anesthesia was also reviewed. The risks                            and benefits of the procedure and the sedation                            options and risks were discussed with the patient.                            All questions were answered, and informed consent                            was obtained. Prior Anticoagulants: The patient                            last took Xarelto (rivaroxaban) 2 days prior to the                            procedure. ASA Grade Assessment: III - A patient                            with severe systemic disease. After reviewing the  risks and benefits, the patient was deemed in                            satisfactory condition to undergo the procedure.                           After obtaining informed consent, the colonoscope                            was passed under direct vision. Throughout the                            procedure, the patient's blood pressure, pulse, and              oxygen saturations were monitored continuously. The                            CF-HQ190L ([id number] Olympus adult colonoscope was                            introduced through the anus and advanced to the the                            cecum, identified by appendiceal orifice and                            ileocecal valve. The colonoscopy was somewhat                            difficult due to multiple diverticula in the colon,                            restricted mobility of the colon and the patient's                            body habitus. Successful completion of the                            procedure was aided by withdrawing the scope and                            replacing with the pediatric colonoscope. Scope In: 11:44:56 AM Scope Out: 12:27:31 PM Scope Withdrawal Time: 0 hours 20 minutes 53 seconds  Total Procedure Duration: 0 hours 42 minutes 35 seconds  Findings:      The perianal and digital rectal examinations were normal. No blood or       heme in the colon lumen.      Six sessile and semi-pedunculated polyps were found in the rectum,       recto-sigmoid colon, descending colon and transverse colon. The polyps       were 5 to 18 mm in size. These polyps were removed with a hot snare.       Resection and retrieval were complete.      A 2 mm polyp was found in the cecum. The polyp was  sessile. The polyp       was removed with a cold biopsy forceps. Resection and retrieval were       complete.      A 14 mm polyp was found in the sigmoid colon. The polyp was ulcerated at       the tip with stigmata of recent bleeding, multi-lobulated and       pedunculated. The polyp was removed with a hot snare. Resection and       retrieval were complete.      Multiple small and large-mouthed diverticula were found in the sigmoid       colon. There was narrowing of the colon in association with the       diverticular opening.      Internal hemorrhoids were found during  retroflexion. The hemorrhoids       were large. Impression:               - Six 5 to 18 mm polyps in the rectum, at the                            recto-sigmoid colon, in the descending colon and in                            the transverse colon, removed with a hot snare.                            Resected and retrieved.                           - One 2 mm polyp in the cecum, removed with a cold                            biopsy forceps. Resected and retrieved.                           - One 14 mm polyp with ulceration at the tip in the                            sigmoid colon, removed with a hot snare. Resected                            and retrieved. Likely source of rectal bleeding                           - Severe diverticulosis in the sigmoid colon. There                            was narrowing of the colon in association with the                            diverticular opening.                           - Large Internal hemorrhoids. Moderate Sedation:      N/A- Per Anesthesia Care Recommendation:           -  Patient has a contact number available for                            emergencies. The signs and symptoms of potential                            delayed complications were discussed with the                            patient. Return to normal activities tomorrow.                            Written discharge instructions were provided to the                            patient.                           - Resume previous diet.                           - Continue present medications.                           - Resume Xarelto (rivaroxaban) at prior dose in 3                            days. Refer to managing physician for further                            adjustment of therapy.                           - Await pathology results.                           - Repeat colonoscopy in 3 years for surveillance                            based on pathology results. Procedure  Code(s):        --- Professional ---                           [phone], Colonoscopy, flexible; with removal of                            tumor(s), polyp(s), or other lesion(s) by snare                            technique                           [id number], 59, Colonoscopy, flexible; with biopsy,                            single or multiple Diagnosis Code(s):        ---  Professional ---                           K62.1, Rectal polyp                           D12.7, Benign neoplasm of rectosigmoid junction                           D12.4, Benign neoplasm of descending colon                           D12.3, Benign neoplasm of transverse colon (hepatic                            flexure or splenic flexure)                           D12.0, Benign neoplasm of cecum                           D12.5, Benign neoplasm of sigmoid colon                           K64.8, Other hemorrhoids                           K92.2, Gastrointestinal hemorrhage, unspecified                           K57.30, Diverticulosis of large intestine without                            perforation or abscess without bleeding CPT copyright 2017 American Medical Association. All rights reserved. The codes documented in this report are preliminary and upon coder review may  be revised to meet current compliance requirements. Mauri Pole, MD 10/27/2017 12:41:58 PM This report has been signed electronically. Number of Addenda: 0

## 2017-10-27 NOTE — Transfer of Care (Signed)
Immediate Anesthesia Transfer of Care Note  Patient: Norman Rodriguez  Procedure(s) Performed: COLONOSCOPY WITH PROPOFOL (N/A ) POLYPECTOMY  Patient Location: PACU  Anesthesia Type:MAC  Level of Consciousness: awake, alert  and oriented  Airway & Oxygen Therapy: Patient Spontanous Breathing and Patient connected to face mask oxygen  Post-op Assessment: Report given to RN and Post -op Vital signs reviewed and stable  Post vital signs: Reviewed and stable  Last Vitals:  Vitals Value Taken Time  BP    Temp    Pulse    Resp    SpO2      Last Pain:  Vitals:   10/27/17 1030  TempSrc: Oral  PainSc:          Complications: No apparent anesthesia complications

## 2017-10-27 NOTE — Anesthesia Preprocedure Evaluation (Signed)
Anesthesia Evaluation  Patient identified by MRN, date of birth, ID band Patient awake    Reviewed: Allergy & Precautions, NPO status , Patient's Chart, lab work & pertinent test results, reviewed documented beta blocker date and time   Airway Mallampati: II  TM Distance: >3 FB Neck ROM: Full    Dental  (+) Teeth Intact, Dental Advisory Given   Pulmonary COPD,  COPD inhaler, former smoker,    Pulmonary exam normal breath sounds clear to auscultation       Cardiovascular hypertension, Pt. on home beta blockers and Pt. on medications + CAD, + Cardiac Stents (BMS to LAD) and +CHF  + dysrhythmias Atrial Fibrillation  Rhythm:Irregular Rate:Abnormal  Echo 03/21/17: Study Conclusions  - Left ventricle: The cavity size was mildly dilated. Wall   thickness was increased in a pattern of mild LVH. Systolic function was moderately to severely reduced. The estimated ejection fraction was in the range of 30% to 35%. There is akinesis of the inferolateral and inferior myocardium. There is hypokinesis of the anteroseptal and inferoseptal myocardium.   Neuro/Psych  Neuromuscular disease negative psych ROS   GI/Hepatic Neg liver ROS, Melena with rectal bleeding   Endo/Other  Morbid obesity  Renal/GU negative Renal ROS     Musculoskeletal  (+) Arthritis ,   Abdominal   Peds  Hematology  (+) Blood dyscrasia (Xarelto), ,   Anesthesia Other Findings Day of surgery medications reviewed with the patient.  Reproductive/Obstetrics                             Anesthesia Physical Anesthesia Plan  ASA: III  Anesthesia Plan: MAC   Post-op Pain Management:    Induction: Intravenous  PONV Risk Score and Plan: 1 and Propofol infusion and Treatment may vary due to age or medical condition  Airway Management Planned: Natural Airway and Simple Face Mask  Additional Equipment:   Intra-op Plan:   Post-operative  Plan:   Informed Consent: I have reviewed the patients History and Physical, chart, labs and discussed the procedure including the risks, benefits and alternatives for the proposed anesthesia with the patient or authorized representative who has indicated his/her understanding and acceptance.   Dental advisory given  Plan Discussed with: CRNA and Anesthesiologist  Anesthesia Plan Comments:         Anesthesia Quick Evaluation

## 2017-10-27 NOTE — Interval H&P Note (Signed)
History and Physical Interval Note:  10/27/2017 11:19 AM  Norman Rodriguez  has presented today for surgery, with the diagnosis of hematochezia  The various methods of treatment have been discussed with the patient and family. After consideration of risks, benefits and other options for treatment, the patient has consented to  Procedure(s): COLONOSCOPY WITH PROPOFOL (N/A) as a surgical intervention .  The patient's history has been reviewed, patient examined, no change in status, stable for surgery.  I have reviewed the patient's chart and labs.  Questions were answered to the patient's satisfaction.     Gabrian Hoque

## 2017-10-28 ENCOUNTER — Encounter (HOSPITAL_COMMUNITY): Payer: Self-pay

## 2017-10-28 LAB — CBC
HCT: 44.2 % (ref 39.0–52.0)
Hemoglobin: 15.3 g/dL (ref 13.0–17.0)
MCH: 33.1 pg (ref 26.0–34.0)
MCHC: 34.6 g/dL (ref 30.0–36.0)
MCV: 95.7 fL (ref 78.0–100.0)
Platelets: 169 10*3/uL (ref 150–400)
RBC: 4.62 MIL/uL (ref 4.22–5.81)
RDW: 14.1 % (ref 11.5–15.5)
WBC: 7 10*3/uL (ref 4.0–10.5)

## 2017-10-28 MED ORDER — PANTOPRAZOLE SODIUM 40 MG PO TBEC: 40.0000 mg | DELAYED_RELEASE_TABLET | Freq: Every day | ORAL | 0 refills | Status: DC

## 2017-10-28 MED ORDER — RIVAROXABAN 20 MG PO TABS: 20.0000 mg | ORAL_TABLET | Freq: Every day | ORAL | 0 refills | Status: DC

## 2017-10-28 MED ORDER — FUROSEMIDE 40 MG PO TABS: 20.0000 mg | ORAL_TABLET | Freq: Every day | ORAL | Status: DC

## 2017-10-28 NOTE — Progress Notes (Signed)
PT Cancellation Note  Patient Details Name: Norman Rodriguez MRN: [id number] DOB: Feb 17, 1955   Cancelled Treatment:     colonoscopy this morning...Marland KitchenMarland KitchenMarland Kitchenplans to D/C to home in afternoon   Rica Koyanagi  PTA [place] Pager      [phone] Office      [phone]

## 2017-10-28 NOTE — Progress Notes (Addendum)
[facility] Progress Note   Chief Complaint:   GI bleeding   SUBJECTIVE:    feels fine. No further bleeding   ASSESSMENT AND PLAN:   1. Painless lower GI bleeding on Xarelto. Bleeding likely from large rectal polyp with ulcerated surface and / or hemorrhoids.   Bleeding low volume, hgb has remained in normal range and at 15.3 today.  -polyp path pending.  -can resume xarelto on Sunday  -surveillance colonoscopy in 3 years. -stable for discharge from GI standpoint -regular diet   2. CAD s/p BMS, PAFon Xarelto s/pmultiple cardioversions, andchronic systolic heart failure.    OBJECTIVE:    Colonoscopy 10/27/17 Six 5 to 18 mm polyps in the rectum, at the recto-sigmoid colon, in the descending colon and in the transverse colon, removed with a hot snare. Resected and retrieved. - One 2 mm polyp in the cecum, removed with a cold biopsy forceps. Resected and retrieved. - One 14 mm polyp with ulceration at the tip in the sigmoid colon, removed with a hot snare. Resected and retrieved. Likely source of rectal bleeding - Severe diverticulosis in the sigmoid colon. There was narrowing of the colon in association with the diverticular opening. - Large Internal hemorrhoids.  Vital signs in last 24 hours: Temp:  [97.9 F (36.6 C)-98.7 F (37.1 C)] 98 F (36.7 C) (10/04 0421) Pulse Rate:  [72-99] 86 (10/04 0840) Resp:  [11-27] 16 (10/04 0421) BP: (98-161)/(59-104) 102/70 (10/04 0840) SpO2:  [91 %-99 %] 95 % (10/04 0807) Last BM Date: 10/27/17 General:   Alert, well-developed, male in NAD EENT:  Normal hearing, non icteric sclera, conjunctive pink.  Heart:  Regular rate and rhythm, no lower extremity edema Pulm: Normal respiratory effort, lungs CTA bilaterally without wheezes or crackles. Abdomen:  Soft, nondistended, nontender.  Normal bowel sounds, no masses felt.     Neurologic:  Alert and  oriented x4;  grossly normal neurologically. Psych:  Pleasant,  cooperative.  Normal mood and affect.   Intake/Output from previous day: 10/03 0701 - 10/04 0700 In: 670 [P.O.:270; I.V.:400] Out: 300 [Urine:300] Intake/Output this shift: No intake/output data recorded.  Lab Results: Recent Labs    10/27/17 0449 10/28/17 0836  WBC 6.6 7.0  HGB 14.5 15.3  HCT 42.2 44.2  PLT 219 169   BMET Recent Labs    10/27/17 0449  NA 140  K 4.0  CL 103  CO2 29  GLUCOSE 120*  BUN 7*  CREATININE 0.88  CALCIUM 8.8*     Active Problems:   Atrial fibrillation (HCC)   CAD (coronary artery disease)   Chronic anticoagulation   NSVT (nonsustained ventricular tachycardia) (HCC)   Hyperlipidemia   Obesity   HTN (hypertension)   Chronic systolic heart failure (HCC)   PAF (paroxysmal atrial fibrillation) (HCC)   Dyspnea and respiratory abnormality   BRBPR (bright red blood per rectum)   COPD (chronic obstructive pulmonary disease) (HCC)   Melena   Polyp of transverse colon   Polyp of descending colon   Polyp of rectum   Polyp of cecum     LOS: 6 days   Tye Savoy ,NP 10/28/2017, 9:43 AM   Attending physician's note   I have taken a history, examined the patient and reviewed the chart. I agree with the Advanced Practitioner's note, impression and recommendations. Rectal bleeding secondary to large sigmoid pedunculated polyp with ulcerated tip in the setting of chronic anticoagulation.  Discussed at length with patient that he has diverticulosis not diverticulitis.  Regular diet as tolerated Okay to restart Xarelto in 2 days.  Hemoglobin remained stable with no evidence of further bleeding Okay to discharge home from GI standpoint We will sign off, please call with any questions   K. Denzil Magnuson , MD [phone]

## 2017-10-28 NOTE — Anesthesia Postprocedure Evaluation (Signed)
Anesthesia Post Note  Patient: Norman Rodriguez  Procedure(s) Performed: COLONOSCOPY WITH PROPOFOL (N/A ) POLYPECTOMY     Patient location during evaluation: PACU Anesthesia Type: MAC Level of consciousness: awake and alert, oriented and awake Pain management: pain level controlled Vital Signs Assessment: post-procedure vital signs reviewed and stable Respiratory status: spontaneous breathing, nonlabored ventilation and respiratory function stable Cardiovascular status: stable and blood pressure returned to baseline Postop Assessment: no apparent nausea or vomiting Anesthetic complications: no    Last Vitals:  Vitals:   10/27/17 2023 10/28/17 0421  BP: 100/66 98/66  Pulse: 84 72  Resp: 18 16  Temp: 37.1 C 36.7 C  SpO2: 95% 91%    Last Pain:  Vitals:   10/28/17 0421  TempSrc: Oral  PainSc:                  Catalina Gravel

## 2017-10-28 NOTE — Care Management Important Message (Signed)
Important Message  Patient Details  Name: Norman Rodriguez MRN: [id number] Date of Birth: 01/15/1956   Medicare Important Message Given:  Yes    Kerin Salen 10/28/2017, 11:00 AMImportant Message  Patient Details  Name: Norman Rodriguez MRN: [id number] Date of Birth: 1955-09-15   Medicare Important Message Given:  Yes    Kerin Salen 10/28/2017, 11:00 AM

## 2017-10-28 NOTE — Discharge Summary (Signed)
Physician Discharge Summary  Norman Rodriguez  QIH:[id number]  DOB: Oct 03, 1955  DOA: 10/22/2017 PCP: Medicine, [facility]  Admit date: 10/22/2017 Discharge date: 10/28/2017  Admitted From: Home  Disposition: Home   Recommendations for Outpatient Follow-up:  1. Follow up with PCP in 1-week 2. Please obtain BMP/CBC in one week to monitor hemoglobin and renal function 3. Please follow up on the following pending results: Pathology from colonoscopy, multiple polyps. 4. Resume Xarelto in 3 days. 5. Follow-up with GI within a week for pathology results. 6. Repeat colonoscopy in 3 years for surveillance.  Discharge Condition: Stable CODE STATUS: Full code Diet recommendation: Heart Healthy   Brief/Interim Summary: For full details see H&P/Progress note, but in brief, Norman Rodriguez is a 62 year old male with medical history significant for CAD, PAF on Xarelto, DVT and chronic systolic CHF, hypertension, COPD/restrictive lung disease.  Patient presented to the emergency department complaining of generalized weakness, orthopnea and black tarry stools. Patient admitted with working diagnosis of GI bleed. GI was consulted, Xarelto was held, underwent EGD on 9/30 showed esophagitis.  Subsequently patient was cleared to resume Xarelto, eventually patient had 2 bloody bowel movement with pain.  GI was reconsulted and recommending colonoscopy.  Subjective: Patient seen and examined, he is very anxious about findings.  No further bloody bowel movement.  Denies abdominal pain, nausea and vomiting.  No acute events overnight.  Hemoglobin trending up.  Discharge Diagnoses/Hospital Course:  Lower GI bleed Secondary to large rectal polyp with ulcerated surface and/or hemorrhoid in setting of Xarelto, EGD was negative.  Blood clot in vaginal vault anoscopy by GI.  Colonoscopy reveals multiple polyps, which were removed and sent to pathology.  Surveillance colonoscopy in 3 years.   Follow-up with GI.  Resume regular diet.  Resume Xarelto within 3 days at regular dose.  Proximal A. fib Rate well controlled with Coreg, resume Xarelto within 3 days at regular dose.  Dizziness Resolved, unclear etiology.  Orthostatic negative.  MRI brain did not reveal acute abnormality.  Today walking around the room and in the hall with no signs of dizziness or vertigo.  Chronic systolic CHF EF of 87% on 2019 Was euvolemic during hospital stay, continue home medications with no changes.  Hypertension BP stable, continue home medications with no changes  COPD Stable  All other chronic medical condition were stable during the hospitalization.  On the day of the discharge the patient's vitals were stable, and no other acute medical condition were reported by patient. the patient was felt safe to be discharge to home.   Discharge Instructions  You were cared for by a hospitalist during your hospital stay. If you have any questions about your discharge medications or the care you received while you were in the hospital after you are discharged, you can call the unit and asked to speak with the hospitalist on call if the hospitalist that took care of you is not available. Once you are discharged, your primary care physician will handle any further medical issues. Please note that NO REFILLS for any discharge medications will be authorized once you are discharged, as it is imperative that you return to your primary care physician (or establish a relationship with a primary care physician if you do not have one) for your aftercare needs so that they can reassess your need for medications and monitor your lab values.  Discharge Instructions    (HEART FAILURE PATIENTS) Call MD:  Anytime you have any of the following symptoms:  1) 3 pound weight gain in 24 hours or 5 pounds in 1 week 2) shortness of breath, with or without a dry hacking cough 3) swelling in the hands, feet or stomach 4) if you  have to sleep on extra pillows at night in order to breathe.   Complete by:  As directed    Call MD for:  difficulty breathing, headache or visual disturbances   Complete by:  As directed    Call MD for:  extreme fatigue   Complete by:  As directed    Call MD for:  hives   Complete by:  As directed    Call MD for:  persistant dizziness or light-headedness   Complete by:  As directed    Call MD for:  persistant nausea and vomiting   Complete by:  As directed    Call MD for:  redness, tenderness, or signs of infection (pain, swelling, redness, odor or green/yellow discharge around incision site)   Complete by:  As directed    Call MD for:  severe uncontrolled pain   Complete by:  As directed    Call MD for:  temperature >100.4   Complete by:  As directed    Diet - low sodium heart healthy   Complete by:  As directed    Increase activity slowly   Complete by:  As directed      Allergies as of 10/28/2017      Reactions   Apple Hives   Soybean Oil Other (See Comments)   unknown   Lisinopril Cough   Other Itching   Pt has itching and joint swelling with walnuts, mildew, fungi, basil and peaches   Bean Pod Extract Rash   Green Beans specifically Joint swelling and itching   Carrot [daucus Carota] Rash   Joint swelling and itching   Grass Extracts [gramineae Pollens] Rash   Joint swelling and itching   Peach Flavor Rash   Joint swelling and itching   Tree Extract Rash   Joint swelling and itching      Medication List    TAKE these medications   ANORO ELLIPTA 62.5-25 MCG/INH Aepb Generic drug:  umeclidinium-vilanterol INHALE ONE PUFFS INTO THE LUNGS DAILY What changed:  See the new instructions.   atorvastatin 40 MG tablet Commonly known as:  LIPITOR TAKE 1 TABLET (40 MG TOTAL) BY MOUTH DAILY. What changed:  See the new instructions.   B-complex with vitamin C tablet Take 1 tablet by mouth daily.   CARNITINE PO Take 500 mg by mouth daily.   carvedilol 12.5 MG  tablet Commonly known as:  COREG TAKE 1 TABLET (12.5 MG TOTAL) BY MOUTH TWO TIMES DAILY WITH A MEAL. What changed:  See the new instructions.   Co Q-10 200 MG Caps Take 1 capsule by mouth daily.   fexofenadine 180 MG tablet Commonly known as:  ALLEGRA Take 180 mg by mouth daily.   furosemide 40 MG tablet Commonly known as:  LASIX Take 0.5-1.5 tablets (20-60 mg total) by mouth daily. Take an additional 40 mg by mouth as needed for fluid weight gain.   gabapentin 300 MG capsule Commonly known as:  NEURONTIN Take 600 mg by mouth as needed (carpel tunnel).   Magnesium 400 MG Caps Take 1 capsule by mouth daily.   meloxicam 15 MG tablet Commonly known as:  MOBIC Take 15 mg by mouth daily.   multivitamin tablet Take 1 tablet by mouth daily.   nitroGLYCERIN 0.4 MG SL tablet Commonly  known as:  NITROSTAT Place 0.4 mg under the tongue every 5 (five) minutes as needed for chest pain.   NON FORMULARY Take 1 capsule by mouth daily. (Ribose) Take 1 capsule once a day   NON FORMULARY Take 1 tablet by mouth daily. Glycine viatmin 1 tab po qd   NON FORMULARY Take 465 mg by mouth as needed (joint pain). HiActives Tart Cherry   OMEGA 3 PO Take 2 capsules by mouth daily.   pantoprazole 40 MG tablet Commonly known as:  PROTONIX Take 1 tablet (40 mg total) by mouth daily at 6 (six) AM. Start taking on:  10/29/2017   potassium chloride SA 20 MEQ tablet Commonly known as:  K-DUR,KLOR-CON TAKE 1 TABLET (20 MEQ TOTAL) BY MOUTH DAILY.   rivaroxaban 20 MG Tabs tablet Commonly known as:  XARELTO Take 1 tablet (20 mg total) by mouth daily with supper. Start taking on:  10/31/2017 What changed:  These instructions start on 10/31/2017. If you are unsure what to do until then, ask your doctor or other care provider.   sacubitril-valsartan 49-51 MG Commonly known as:  ENTRESTO Take 1 tablet by mouth 2 (two) times daily.   sildenafil 100 MG tablet Commonly known as:  VIAGRA Take 1  tablet (100 mg total) by mouth daily as needed for erectile dysfunction.   vitamin C 500 MG tablet Commonly known as:  ASCORBIC ACID Take 500 mg by mouth daily.   VITAMIN D (CHOLECALCIFEROL) PO Take 2 tablets by mouth daily.      Follow-up Information    [facility], [facility]. Schedule an appointment as soon as possible for a visit in 1 week(s).   Specialty:  Pediatric Gastroenterology Why:  Hospital follow up          Allergies  Allergen Reactions  . Apple Hives  . Soybean Oil Other (See Comments)    unknown  . Lisinopril Cough  . Other Itching    Pt has itching and joint swelling with walnuts, mildew, fungi, basil and peaches  . Bean Pod Extract Rash    Green Beans specifically Joint swelling and itching  . Carrot [Daucus Carota] Rash    Joint swelling and itching  . Grass Extracts [Gramineae Pollens] Rash    Joint swelling and itching  . Peach Flavor Rash    Joint swelling and itching  . Tree Extract Rash    Joint swelling and itching    Consultations: GI  Procedures/Studies: Dg Chest 2 View  Result Date: 10/25/2017 CLINICAL DATA:  Bright red blood and tarry stool this am - with smoke SOB -hx afib - chronic systolic CHF - hypertension - nonsustained ventricular tachycardia - former smoker EXAM: CHEST - 2 VIEW COMPARISON:  05/14/2013 FINDINGS: Cardiac silhouette is mildly enlarged. No mediastinal or hilar masses. No convincing adenopathy. There are prominent bronchovascular markings with mild interstitial thickening, most evident in the lower lungs. No lung consolidation. No pleural effusion or pneumothorax. Skeletal structures are intact. IMPRESSION: 1. Cardiomegaly with prominent bronchovascular markings and mild interstitial thickening. Findings suggest mild congestive heart failure. No evidence of pneumonia. Electronically Signed   By: Lajean Manes M.D.   On: 10/25/2017 19:28    Discharge Exam: Vitals:   10/28/17 0807 10/28/17 0840  BP:   102/70  Pulse:  86  Resp:    Temp:    SpO2: 95%    Vitals:   10/28/17 0421 10/28/17 0806 10/28/17 0807 10/28/17 0840  BP: 98/66   102/70  Pulse: 72  86  Resp: 16     Temp: 98 F (36.7 C)     TempSrc: Oral     SpO2: 91% 95% 95%   Weight:      Height:        General: Pt is alert, awake, not in acute distress Cardiovascular: RRR, S1/S2 +, no rubs, no gallops Respiratory: CTA bilaterally, no wheezing, no rhonchi Abdominal: Soft, NT, ND, bowel sounds + Extremities: no edema, no cyanosis   The results of significant diagnostics from this hospitalization (including imaging, microbiology, ancillary and laboratory) are listed below for reference.     Microbiology: No results found for this or any previous visit (from the past 240 hour(s)).   Labs: BNP (last 3 results) No results for input(s): BNP in the last 8760 hours. Basic Metabolic Panel: Recent Labs  Lab 10/22/17 1358 10/23/17 0641 10/27/17 0449  NA 139 142 140  K 4.5 3.9 4.0  CL 105 106 103  CO2 26 28 29   GLUCOSE 123* 107* 120*  BUN 16 13 7*  CREATININE 1.00 1.03 0.88  CALCIUM 9.2 8.8* 8.8*   Liver Function Tests: Recent Labs  Lab 10/22/17 1358  AST 30  ALT 35  ALKPHOS 41  BILITOT 1.3*  PROT 7.0  ALBUMIN 3.9   No results for input(s): LIPASE, AMYLASE in the last 168 hours. No results for input(s): AMMONIA in the last 168 hours. CBC: Recent Labs  Lab 10/22/17 1358  10/23/17 0641 10/23/17 1249 10/25/17 0421 10/27/17 0449 10/28/17 0836  WBC 7.0  --  5.0  --  7.8 6.6 7.0  NEUTROABS  --   --   --   --   --  3.9  --   HGB 15.4  --  14.7  --  14.1 14.5 15.3  HCT 44.8   < > 43.7 44.6 42.2 42.2 44.2  MCV 96.6  --  98.0  --  98.4 97.0 95.7  PLT 198  --  184  --  186 219 169   < > = values in this interval not displayed.   Cardiac Enzymes: No results for input(s): CKTOTAL, CKMB, CKMBINDEX, TROPONINI in the last 168 hours. BNP: Invalid input(s): POCBNP CBG: No results for input(s): GLUCAP in the  last 168 hours. D-Dimer No results for input(s): DDIMER in the last 72 hours. Hgb A1c No results for input(s): HGBA1C in the last 72 hours. Lipid Profile No results for input(s): CHOL, HDL, LDLCALC, TRIG, CHOLHDL, LDLDIRECT in the last 72 hours. Thyroid function studies No results for input(s): TSH, T4TOTAL, T3FREE, THYROIDAB in the last 72 hours.  Invalid input(s): FREET3 Anemia work up No results for input(s): VITAMINB12, FOLATE, FERRITIN, TIBC, IRON, RETICCTPCT in the last 72 hours. Urinalysis    Component Value Date/Time   COLORURINE YELLOW 11/01/2008 1522   APPEARANCEUR CLEAR 11/01/2008 1522   LABSPEC 1.016 11/01/2008 1522   PHURINE 7.5 11/01/2008 1522   GLUCOSEU 250 (A) 11/01/2008 1522   HGBUR MODERATE (A) 11/01/2008 1522   BILIRUBINUR NEGATIVE 11/01/2008 1522   KETONESUR NEGATIVE 11/01/2008 1522   PROTEINUR 100 (A) 11/01/2008 1522   UROBILINOGEN 0.2 11/01/2008 1522   NITRITE NEGATIVE 11/01/2008 1522   LEUKOCYTESUR SMALL (A) 11/01/2008 1522   Sepsis Labs Invalid input(s): PROCALCITONIN,  WBC,  LACTICIDVEN Microbiology No results found for this or any previous visit (from the past 240 hour(s)).   Time coordinating discharge: 32 minutes  SIGNED:  Chipper Oman, MD  [facility] 10/28/2017, 12:19 PM  Pager please text page via  [email or web address]  Note - This record has been created using [employer]. Chart creation errors have been sought, but may not always have been located. Such creation errors do not reflect on the standard of medical care.

## 2017-11-02 ENCOUNTER — Ambulatory Visit (INDEPENDENT_AMBULATORY_CARE_PROVIDER_SITE_OTHER): Payer: Medicare Other | Admitting: Physician Assistant

## 2017-11-02 ENCOUNTER — Encounter: Payer: Self-pay | Admitting: Physician Assistant

## 2017-11-02 VITALS — BP 110/74 | HR 80 | Ht 68.0 in | Wt 269.0 lb

## 2017-11-02 DIAGNOSIS — K5730 Diverticulosis of large intestine without perforation or abscess without bleeding: Secondary | ICD-10-CM | POA: Diagnosis not present

## 2017-11-02 DIAGNOSIS — K922 Gastrointestinal hemorrhage, unspecified: Secondary | ICD-10-CM

## 2017-11-02 DIAGNOSIS — K648 Other hemorrhoids: Secondary | ICD-10-CM

## 2017-11-02 DIAGNOSIS — D369 Benign neoplasm, unspecified site: Secondary | ICD-10-CM | POA: Diagnosis not present

## 2017-11-02 NOTE — Patient Instructions (Addendum)
We have provided you with a High FIber diet  Handout and Diverticulosis/Diverticulitis handout. Add Benefiber- 1 dose daily in a glass of water.  Call for any sign of bleeding over the past 10 days.    Normal BMI (Body Mass Index- based on height and weight) is between 19 and 25. Your BMI today is Body mass index is 40.9 kg/m. [name] Please consider follow up  regarding your BMI with your Primary Care Provider.

## 2017-11-02 NOTE — Progress Notes (Signed)
Subjective:    Patient ID: Norman Rodriguez, male    DOB: Nov 02, 1955, 62 y.o.   MRN: [id number]  HPI Norman Rodriguez is a pleasant 62 year old white male, who was initially seen as a new patient in April 2019 by myself for colon cancer screening.  Because of multiple comorbidities with atrial fibrillation, cardiomyopathy with EF of 30%, chronic anticoagulation, coronary artery disease status post stents and multiple cardioversions for A. fib as well as COPD, options were discussed with the patient and we decided to proceed with Cologuard stool DNA testing.  He was of average risk with no family history. Cologuard returned negative Unfortunately patient was hospitalized 9/30 through 10/42019 with an acute lower GI bleed.  He did not require transfusions and actually was not anemic on discharge with hemoglobin of 15.3.  He underwent EGD per Dr. Silverio Decamp on 10/24/2017 with grade 1 esophagitis and mild erythematous duodenoscopy Xarelto was held and then he  underwent colonoscopy on 10/27/2017.  He had one 14 mm sigmoid colon polyp which was multilobulated and had a small ulcer which was felt to be the source of acute bleeding.  He also had a 2 mm cecal polyp and had 6 other polyps ranging in size from 5 to 8 mm.  All of the polyps were completely removed.  Also noted to have large internal hemorrhoids and sigmoid diverticulosis. Path has returned showing the largest polyp to be a serrated adenoma, no high-grade dysplasia and all of the other polyps were tubular adenomas with no high-grade dysplasia.  Patient resumed Xarelto yesterday.  He has done well since discharge from the hospital with no further evidence of bleeding.  He said he had a good bowel movement today.  He has no complaints of abdominal discomfort. He is obviously somewhat distressed by the fact that the Cologuard was negative and he was found to have so many polyps. He had some other frustrations during the hospitalization regarding his medications  1 of which was Delene Loll which apparently was left off of his list. He had a lot of questions today regarding the colonoscopy findings, diverticulosis and management etc.  Review of Systems Pertinent positive and negative review of systems were noted in the above HPI section.  All other review of systems was otherwise negative.  Outpatient Encounter Medications as of 11/02/2017  Medication Sig  . ANORO ELLIPTA 62.5-25 MCG/INH AEPB INHALE ONE PUFFS INTO THE LUNGS DAILY (Patient taking differently: Inhale 1 puff into the lungs daily. )  . atorvastatin (LIPITOR) 40 MG tablet TAKE 1 TABLET (40 MG TOTAL) BY MOUTH DAILY. (Patient taking differently: Take 40 mg by mouth daily at 6 PM. )  . B Complex-C (B-COMPLEX WITH VITAMIN C) tablet Take 1 tablet by mouth daily.  . carvedilol (COREG) 12.5 MG tablet TAKE 1 TABLET (12.5 MG TOTAL) BY MOUTH TWO TIMES DAILY WITH A MEAL. (Patient taking differently: Take 12.5 mg by mouth 2 (two) times daily with a meal. TAKE 1 TABLET (12.5 MG TOTAL) BY MOUTH TWO TIMES DAILY WITH A MEAL.)  . Coenzyme Q10 (CO Q-10) 200 MG CAPS Take 1 capsule by mouth daily.  . fexofenadine (ALLEGRA) 180 MG tablet Take 180 mg by mouth daily.  . furosemide (LASIX) 40 MG tablet Take 0.5-1.5 tablets (20-60 mg total) by mouth daily. Take an additional 40 mg by mouth as needed for fluid weight gain.  [name] gabapentin (NEURONTIN) 300 MG capsule Take 600 mg by mouth as needed (carpel tunnel).   . LevOCARNitine (CARNITINE PO) Take 500  mg by mouth daily.  . Magnesium 400 MG CAPS Take 1 capsule by mouth daily.   . meloxicam (MOBIC) 15 MG tablet Take 15 mg by mouth daily.  . Multiple Vitamin (MULTIVITAMIN) tablet Take 1 tablet by mouth daily.  . nitroGLYCERIN (NITROSTAT) 0.4 MG SL tablet Place 0.4 mg under the tongue every 5 (five) minutes as needed for chest pain.  . NON FORMULARY Take 1 capsule by mouth daily. (Ribose) Take 1 capsule once a day   . NON FORMULARY Take 1 tablet by mouth daily. Glycine  viatmin 1 tab po qd  . NON FORMULARY Take 465 mg by mouth as needed (joint pain). [place]   . Omega-3 Fatty Acids (OMEGA 3 PO) Take 2 capsules by mouth daily.   . pantoprazole (PROTONIX) 40 MG tablet Take 1 tablet (40 mg total) by mouth daily at 6 (six) AM.  . potassium chloride SA (K-DUR,KLOR-CON) 20 MEQ tablet TAKE 1 TABLET (20 MEQ TOTAL) BY MOUTH DAILY.  . rivaroxaban (XARELTO) 20 MG TABS tablet Take 1 tablet (20 mg total) by mouth daily with supper.  . sacubitril-valsartan (ENTRESTO) 49-51 MG Take 1 tablet by mouth 2 (two) times daily.  . sildenafil (VIAGRA) 100 MG tablet Take 1 tablet (100 mg total) by mouth daily as needed for erectile dysfunction.  . vitamin C (ASCORBIC ACID) 500 MG tablet Take 500 mg by mouth daily.  [name] VITAMIN D, CHOLECALCIFEROL, PO Take 2 tablets by mouth daily.   No facility-administered encounter medications on file as of 11/02/2017.    Allergies  Allergen Reactions  . Apple Hives  . Soybean Oil Other (See Comments)    unknown  . Lisinopril Cough  . Other Itching    Pt has itching and joint swelling with walnuts, mildew, fungi, basil and peaches  . Bean Pod Extract Rash    Green Beans specifically Joint swelling and itching  . Carrot [Daucus Carota] Rash    Joint swelling and itching  . Grass Extracts [Gramineae Pollens] Rash    Joint swelling and itching  . Peach Flavor Rash    Joint swelling and itching  . Tree Extract Rash    Joint swelling and itching   Patient Active Problem List   Diagnosis Date Noted  . Polyp of transverse colon   . Polyp of descending colon   . Polyp of rectum   . Polyp of cecum   . Melena   . BRBPR (bright red blood per rectum) 10/22/2017  . COPD (chronic obstructive pulmonary disease) ([place]) 10/22/2017  . Cubital tunnel syndrome, left 02/21/2017  . Dyspnea and respiratory abnormality 04/14/2016  . PAF (paroxysmal atrial fibrillation) ([place]) 05/08/2014  . Chest pain 05/14/2013  . Chronic systolic heart  failure ([place]) 03/29/2013  . Cardiomyopathy ([place])   . CAD (coronary artery disease)   . Chronic anticoagulation   . NSVT (nonsustained ventricular tachycardia) ([place])   . Hyperlipidemia   . Obesity   . HTN (hypertension)   . Atrial fibrillation ([place]) 11/03/2012   Social History   Socioeconomic History  . Marital status: Single    Spouse name: Not on file  . Number of children: Not on file  . Years of education: Not on file  . Highest education level: Not on file  Occupational History  . Not on file  Social Needs  . Financial resource strain: Not on file  . Food insecurity:    Worry: Not on file    Inability: Not on file  . Transportation  needs:    Medical: Not on file    Non-medical: Not on file  Tobacco Use  . Smoking status: Former Smoker    Types: Cigars  . Smokeless tobacco: Never Used  . Tobacco comment: 1 cigar per month states he stopped smoking  Substance and Sexual Activity  . Alcohol use: No  . Drug use: No    Comment: 1 per month...pt states he stopped smoking  . Sexual activity: Not on file  Lifestyle  . Physical activity:    Days per week: Not on file    Minutes per session: Not on file  . Stress: Not on file  Relationships  . Social connections:    Talks on phone: Not on file    Gets together: Not on file    Attends religious service: Not on file    Active member of club or organization: Not on file    Attends meetings of clubs or organizations: Not on file    Relationship status: Not on file  . Intimate partner violence:    Fear of current or ex partner: Not on file    Emotionally abused: Not on file    Physically abused: Not on file    Forced sexual activity: Not on file  Other Topics Concern  . Not on file  Social History Narrative  . Not on file    Mr. Crill family history includes Other in his father.      Objective:    Vitals:   11/02/17 1451  BP: 110/74  Pulse: 80    Physical Exam; well-developed older white male in no  acute distress, blood pressure 110/74 pulse 80, BMI 40.9.  No exam today discussion only      Assessment & Plan:   #74 62 year old white male seen today in post hospital follow-up after recent admission with acute lower GI bleeding hospitalization 932 through 10/28/2017.  Bleeding was in the setting of Xarelto, and he was found at colonoscopy to have a 14 mm sigmoid colon polyp which was multilobulated and had a small ulcer which was felt to be the source of bleeding. He also had 7 other polyps removed.  All polyps were removed completely, the largest polyp is a serrated adenoma with no high-grade dysplasia and the other polyps were all tubular adenomas without high-grade dysplasia.  Patient did not require transfusions and was not anemic during admission, discharge hemoglobin was 15.3.  He has done well since discharge no active bleeding, and resume Xarelto yesterday.  #2 diverticulosis   #3 internal hemorrhoids #4.  Chronic atrial fibrillation-on Xarelto #5.  Ischemic cardiomyopathy with EF 30 to 35% #6.  Coronary artery disease status post stent #7.  COPD #8  Plan; Long discussion today with the patient reviewing findings at colonoscopy, path report, and indication  for 3-year interval follow-up.   We reviewed general management of diverticulosis which she had questioned.  He is advised to follow a high-fiber diet, add Benefiber 1 dose daily and a large glass of water, and avoid popcorn and nuts.  There is no specific recommendation at this time for avoidance of any other particular foods.  We also discussed in detail the false negative Cologuard and implications.  He wishes to maintain follow-up with Dr. Silverio Decamp, as he has never met Dr. Havery Moros. He is asked to watch his bowels carefully over the next couple of weeks for any evidence of recurrent bleeding, I explained that if there is risk of bleeding for at least 2 weeks  after polypectomies , and he is at somewhat higher risk on  Xarelto. He knows to call for any problems.  Greater than 50% of visit today was spent in education counseling and management of GI disease as outlined above.  Vidyuth Belsito S Taeshaun Rames PA-C 11/02/2017   Cc: [facility], [place]

## 2017-11-02 NOTE — Progress Notes (Signed)
Agree with assessment and plan as outlined.  

## 2017-11-08 ENCOUNTER — Encounter: Payer: Self-pay | Admitting: Gastroenterology

## 2017-11-08 NOTE — Progress Notes (Signed)
Reviewed and agree with documentation and assessment and plan. K. Veena Laci Frenkel , MD   

## 2017-11-09 ENCOUNTER — Telehealth: Payer: Self-pay | Admitting: Nurse Practitioner

## 2017-11-09 NOTE — Telephone Encounter (Signed)
S/w patient gave Dr.Skains advice pt stated will just continue to drink bacardi and jim beam.

## 2017-11-09 NOTE — Telephone Encounter (Signed)
New Message;   Patient calling concerning a Hem[employer] store he would like to know if he can use anything out that store.

## 2017-11-09 NOTE — Telephone Encounter (Signed)
There would not be anything that we would approve/endorse - there are lots of drug interactions with these products. Would advise against.

## 2017-11-28 ENCOUNTER — Other Ambulatory Visit: Payer: Self-pay | Admitting: Pulmonary Disease

## 2017-11-28 ENCOUNTER — Other Ambulatory Visit: Payer: Self-pay | Admitting: Cardiology

## 2017-12-14 ENCOUNTER — Encounter: Payer: Self-pay | Admitting: Nurse Practitioner

## 2017-12-14 ENCOUNTER — Ambulatory Visit: Payer: Medicare Other | Admitting: Nurse Practitioner

## 2017-12-14 NOTE — Progress Notes (Deleted)
CARDIOLOGY OFFICE NOTE  Date:  12/14/2017    Norman Rodriguez Date of Birth: 01/03/1956 Medical Record #[id number]  PCP:  [facility] (Inactive)  Cardiologist:  Marisa Cyphers    No chief complaint on file.   History of Present Illness: Norman Rodriguez is a 62 y.o. male who presents today for a 4 month check. Seen for Dr. Marlou Porch.   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   He failed Tikosyn, amiodarone, multiple cardioversions. We are now in rate control strategy along with chronic anticoagulation. He has had chronic DOE.   Comes in today. Here alone.   Past Medical History:  Diagnosis Date  . Arthritis   . Atrial fibrillation ([place])    a. Dx 2010. b. Initiated on Tikosyn 03/2013.  [name] CAD (coronary artery disease)    a. BMS 2.52mm 23 to LAD. CTO of RCA unsuccessful, good left to right collaterals - 2010.  [name] Chronic anticoagulation   . Chronic systolic CHF (congestive heart failure) ([place])    a. Both ischemic and tachy induced. b. Echo 04/2013: EF 30-35%, no RWMA, normal RV, mod dilated LA (before restoration of NSR).  . COPD (chronic obstructive pulmonary disease) ([place]) 10/22/2017  . Dysrhythmia    hx of atrial fibrilation  . HTN (hypertension)   . Hyperlipidemia   . NSVT (nonsustained ventricular tachycardia) ([place])   . Obesity   . Shortness of breath     Past Surgical History:  Procedure Laterality Date  . CARDIOVERSION N/A 04/27/2013   Procedure: BEDSIDE CARDIOVERSION;  Surgeon: Sanda Klein, MD;  Location: [facility];  Service: Cardiovascular;  Laterality: N/A;  . CARDIOVERSION N/A 05/15/2013   Procedure: CARDIOVERSION/BEDSIDE;  Surgeon: Larey Dresser, MD;  Location: [facility];  Service: Cardiovascular;  Laterality: N/A;  .  CARDIOVERSION N/A 05/16/2014   Procedure: CARDIOVERSION;  Surgeon: Jerline Pain, MD;  Location: [facility];  Service: Cardiovascular;  Laterality: N/A;  . CARDIOVERSION N/A 06/07/2014   Procedure: CARDIOVERSION;  Surgeon: Jerline Pain, MD;  Location: [facility];  Service: Cardiovascular;  Laterality: N/A;  . COLONOSCOPY WITH PROPOFOL N/A 10/27/2017   Procedure: COLONOSCOPY WITH PROPOFOL;  Surgeon: Mauri Pole, MD;  [facility]: [facility];  Service: Endoscopy;  Laterality: N/A;  . CORONARY ANGIOPLASTY WITH STENT PLACEMENT  2010   BMS 2.96mm 23 to LAD. CTO of RCA unsuccessful, good left to right collaterals  . ESOPHAGOGASTRODUODENOSCOPY (EGD) WITH PROPOFOL N/A 10/24/2017   Procedure: ESOPHAGOGASTRODUODENOSCOPY (EGD) WITH PROPOFOL;  Surgeon: Mauri Pole, MD;  [facility];  Service: Endoscopy;  Laterality: N/A;  . FOOT SURGERY Right   . POLYPECTOMY  10/27/2017   Procedure: POLYPECTOMY;  Surgeon: Mauri Pole, MD;  [facility];  Service: Endoscopy;;     Medications: No outpatient medications have been marked as taking for the 12/14/17 encounter (Appointment) with Burtis Junes, NP.     Allergies: Allergies  Allergen Reactions  . Apple Hives  . Soybean Oil Other (See Comments)    unknown  . Lisinopril Cough  . Other Itching    Pt has itching and joint swelling with walnuts, mildew, fungi, basil and peaches  . Bean Pod Extract Rash    Green Beans specifically Joint swelling and itching  . Carrot [Daucus Carota] Rash  Joint swelling and itching  . Grass Extracts [Gramineae Pollens] Rash    Joint swelling and itching  . Peach Flavor Rash    Joint swelling and itching  . Tree Extract Rash    Joint swelling and itching    Social History: The patient  reports that he has quit smoking. His smoking use included cigars. He has never used smokeless tobacco. He reports that he does not drink alcohol or use drugs.   Family History: The  patient's family history includes Other in his father.   Review of Systems: Please see the history of present illness.   Otherwise, the review of systems is positive for none.   All other systems are reviewed and negative.   Physical Exam: VS:  There were no vitals taken for this visit. [name]  BMI There is no height or weight on file to calculate BMI.  Wt Readings from Last 3 Encounters:  11/02/17 269 lb (122 kg)  10/24/17 261 lb 14.4 oz (118.8 kg)  08/11/17 275 lb (124.7 kg)    General: Pleasant. Well developed, well nourished and in no acute distress.   HEENT: Normal.  Neck: Supple, no JVD, carotid bruits, or masses noted.  Cardiac: Regular rate and rhythm. No murmurs, rubs, or gallops. No edema.  Respiratory:  Lungs are clear to auscultation bilaterally with normal work of breathing.  GI: Soft and nontender.  MS: No deformity or atrophy. Gait and ROM intact.  Skin: Warm and dry. Color is normal.  Neuro:  Strength and sensation are intact and no gross focal deficits noted.  Psych: Alert, appropriate and with normal affect.   LABORATORY DATA:  EKG:  EKG is not ordered today.   Lab Results  Component Value Date   WBC 7.0 10/28/2017   HGB 15.3 10/28/2017   HCT 44.2 10/28/2017   PLT 169 10/28/2017   GLUCOSE 120 (H) 10/27/2017   CHOL 195 02/28/2017   TRIG 167 (H) 02/28/2017   HDL 39 (L) 02/28/2017   LDLCALC 123 (H) 02/28/2017   ALT 35 10/22/2017   AST 30 10/22/2017   NA 140 10/27/2017   K 4.0 10/27/2017   CL 103 10/27/2017   CREATININE 0.88 10/27/2017   BUN 7 (L) 10/27/2017   CO2 29 10/27/2017   TSH 1.100 05/14/2013   PSA 1.52 03/21/2007   INR 1.26 10/22/2017   HGBA1C 6.0 (H) 05/14/2013     BNP (last 3 results) No results for input(s): BNP in the last 8760 hours.  ProBNP (last 3 results) Recent Labs    02/28/17 1621  PROBNP 2,250*     Other Studies Reviewed Today:  Echo Study Conclusions 02/2017 - Left ventricle: The cavity size was mildly dilated.  Wall   thickness was increased in a pattern of mild LVH. Systolic   function was moderately to severely reduced. The estimated   ejection fraction was in the range of 30% to 35%. There is   akinesis of the inferolateral and inferior myocardium. There is   hypokinesis of the anteroseptal and inferoseptal myocardium.  Impressions:  - Technically difficult; definity used; akinesis of the inferior   and inferolateral walls with overall moderate to severe LV   dysfunction; mild LVH and LVE.   Assessment/Plan:  1. Permanent AF  2. Chronic systolic HF - he is on Entresto -   3. Mixed CM - ischemia and tachycardia -   4. Chronic anticoagulation   5. Chronic DOE - probably multifactorial - former smoker -  6. CAD -   7. Morbid obesity  Current medicines are reviewed with the patient today.  The patient does not have concerns regarding medicines other than what has been noted above.  The following changes have been made:  See above.  Labs/ tests ordered today include:   No orders of the defined types were placed in this encounter.    Disposition:   FU with *** in {gen number 3-22:025427} {Days to years:10300}.   Patient is agreeable to this plan and will call if any problems develop in the interim.   SignedTruitt Merle, NP  12/14/2017 3:24 PM  [facility] [address] [place], [place]  [address] Phone: [phone] Fax: [phone]

## 2017-12-21 ENCOUNTER — Ambulatory Visit: Payer: Medicare Other | Admitting: Nurse Practitioner

## 2018-01-04 ENCOUNTER — Encounter: Payer: Self-pay | Admitting: Nurse Practitioner

## 2018-01-30 ENCOUNTER — Ambulatory Visit (INDEPENDENT_AMBULATORY_CARE_PROVIDER_SITE_OTHER): Payer: Medicare Other | Admitting: Nurse Practitioner

## 2018-01-30 ENCOUNTER — Encounter: Payer: Self-pay | Admitting: Nurse Practitioner

## 2018-01-30 ENCOUNTER — Encounter (INDEPENDENT_AMBULATORY_CARE_PROVIDER_SITE_OTHER): Payer: Self-pay

## 2018-01-30 VITALS — BP 116/62 | HR 98 | Ht 68.0 in | Wt 269.0 lb

## 2018-01-30 DIAGNOSIS — I5022 Chronic systolic (congestive) heart failure: Secondary | ICD-10-CM | POA: Diagnosis not present

## 2018-01-30 DIAGNOSIS — I4819 Other persistent atrial fibrillation: Secondary | ICD-10-CM | POA: Diagnosis not present

## 2018-01-30 DIAGNOSIS — E7849 Other hyperlipidemia: Secondary | ICD-10-CM | POA: Diagnosis not present

## 2018-01-30 NOTE — Progress Notes (Signed)
CARDIOLOGY OFFICE NOTE  Date:  01/30/2018    Trixie Rude Date of Birth: 1955/12/14 Medical Record #[id number]  PCP:  [facility] (Inactive)  Cardiologist:  Marisa Cyphers   Chief Complaint  Patient presents with  . Cardiomyopathy  . Congestive Heart Failure  . Shortness of Breath    Follow up visit - seen for Dr. Marlou Porch    History of Present Illness: QUSAI KEM is a 63 y.o. male who presents today for a 6 month check. Seen for Dr. Marlou Porch.   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   He failed Tikosyn, amiodarone, and multiple cardioversions. We are now in rate control strategy along with his anticoagulation.   Complaints have been fairly consistent over the last several visits, shortness of breath with activity. He does better with the cooler weather/AC environment.   He was last seen in July of 2019. Hospitalized in July with melana - had colonoscopy with multiple polyps - Xarelto held for short period and then restarted.   Comes in today. Here alone.  He has changed insurance - this has helped with his transportation needs. He has continued to have DOE. Some atypical chest pains over the holidays - thinks it was probably what he was eating. He notes that his antihistamine really helps with this.  He really likes being on Entresto - thinks this helps him. Not much activity as a general rule. Weight is unchanged. Not bleeding/bruising. Has not had any follow up lab since he was in the hospital.   Past Medical History:  Diagnosis Date  . Arthritis   . Atrial fibrillation ([place])    a. Dx 2010. b. Initiated on Tikosyn 03/2013.  [name] CAD (coronary artery disease)    a. BMS 2.32mm 23 to LAD. CTO of RCA unsuccessful, good left to right collaterals - 2010.  [name] Chronic anticoagulation   . Chronic  systolic CHF (congestive heart failure) ([place])    a. Both ischemic and tachy induced. b. Echo 04/2013: EF 30-35%, no RWMA, normal RV, mod dilated LA (before restoration of NSR).  . COPD (chronic obstructive pulmonary disease) ([place]) 10/22/2017  . Dysrhythmia    hx of atrial fibrilation  . HTN (hypertension)   . Hyperlipidemia   . NSVT (nonsustained ventricular tachycardia) ([place])   . Obesity   . Shortness of breath     Past Surgical History:  Procedure Laterality Date  . CARDIOVERSION N/A 04/27/2013   Procedure: BEDSIDE CARDIOVERSION;  Surgeon: Sanda Klein, MD;  Location: [facility];  Service: Cardiovascular;  Laterality: N/A;  . CARDIOVERSION N/A 05/15/2013   Procedure: CARDIOVERSION/BEDSIDE;  Surgeon: Larey Dresser, MD;  Location: [facility];  Service: Cardiovascular;  Laterality: N/A;  . CARDIOVERSION N/A 05/16/2014   Procedure: CARDIOVERSION;  Surgeon: Jerline Pain, MD;  Location: [facility];  Service: Cardiovascular;  Laterality: N/A;  . CARDIOVERSION N/A 06/07/2014   Procedure: CARDIOVERSION;  Surgeon: Jerline Pain, MD;  Location: [facility];  Service: Cardiovascular;  Laterality: N/A;  . COLONOSCOPY WITH PROPOFOL N/A 10/27/2017   Procedure: COLONOSCOPY WITH PROPOFOL;  Surgeon: Mauri Pole, MD;  [facility];  Service: Endoscopy;  Laterality: N/A;  . CORONARY ANGIOPLASTY WITH STENT PLACEMENT  2010   BMS 2.50mm 23 to LAD. CTO of RCA unsuccessful, good left to right collaterals  . ESOPHAGOGASTRODUODENOSCOPY (EGD) WITH PROPOFOL N/A 10/24/2017  Procedure: ESOPHAGOGASTRODUODENOSCOPY (EGD) WITH PROPOFOL;  Surgeon: Mauri Pole, MD;  [facility];  Service: Endoscopy;  Laterality: N/A;  . FOOT SURGERY Right   . POLYPECTOMY  10/27/2017   Procedure: POLYPECTOMY;  Surgeon: Mauri Pole, MD;  [facility];  Service: Endoscopy;;     Medications: Current Meds  Medication Sig  . ANORO ELLIPTA 62.5-25 MCG/INH AEPB INHALE ONE PUFFS INTO THE LUNGS  DAILY  . atorvastatin (LIPITOR) 40 MG tablet TAKE 1 TABLET (40 MG TOTAL) BY MOUTH DAILY. (Patient taking differently: Take 40 mg by mouth daily at 6 PM. )  . B Complex-C (B-COMPLEX WITH VITAMIN C) tablet Take 1 tablet by mouth daily.  . carvedilol (COREG) 12.5 MG tablet TAKE 1 TABLET (12.5 MG TOTAL) BY MOUTH TWO TIMES DAILY WITH A MEAL.  [name] Coenzyme Q10 (CO Q-10) 200 MG CAPS Take 1 capsule by mouth daily.  . fexofenadine (ALLEGRA) 180 MG tablet Take 180 mg by mouth daily.  . furosemide (LASIX) 40 MG tablet Take 0.5-1.5 tablets (20-60 mg total) by mouth daily. Take an additional 40 mg by mouth as needed for fluid weight gain.  [name] gabapentin (NEURONTIN) 300 MG capsule Take 600 mg by mouth as needed (carpel tunnel).   . LevOCARNitine (CARNITINE PO) Take 500 mg by mouth daily.  . Magnesium 400 MG CAPS Take 1 capsule by mouth daily.   . meloxicam (MOBIC) 15 MG tablet Take 15 mg by mouth daily.  . Multiple Vitamin (MULTIVITAMIN) tablet Take 1 tablet by mouth daily.  . nitroGLYCERIN (NITROSTAT) 0.4 MG SL tablet Place 0.4 mg under the tongue every 5 (five) minutes as needed for chest pain.  . NON FORMULARY Take 1 capsule by mouth daily. (Ribose) Take 1 capsule once a day   . NON FORMULARY Take 1 tablet by mouth daily. Glycine vitamin 1 tab po qd  . NON FORMULARY Take 465 mg by mouth as needed (joint pain). [place]   . Omega-3 Fatty Acids (OMEGA 3 PO) Take 2 capsules by mouth daily.   . potassium chloride SA (K-DUR,KLOR-CON) 20 MEQ tablet TAKE 1 TABLET (20 MEQ TOTAL) BY MOUTH DAILY.  . rivaroxaban (XARELTO) 20 MG TABS tablet Take 1 tablet (20 mg total) by mouth daily with supper.  . sacubitril-valsartan (ENTRESTO) 49-51 MG Take 1 tablet by mouth 2 (two) times daily.  . vitamin C (ASCORBIC ACID) 500 MG tablet Take 500 mg by mouth daily.  [name] VITAMIN D, CHOLECALCIFEROL, PO Take 2 tablets by mouth daily.     Allergies: Allergies  Allergen Reactions  . Apple Hives  . Soybean Oil Other (See  Comments)    unknown  . Lisinopril Cough  . Other Itching    Pt has itching and joint swelling with walnuts, mildew, fungi, basil and peaches  . Bean Pod Extract Rash    Green Beans specifically Joint swelling and itching  . Carrot [Daucus Carota] Rash    Joint swelling and itching  . Grass Extracts [Gramineae Pollens] Rash    Joint swelling and itching  . Peach Flavor Rash    Joint swelling and itching  . Tree Extract Rash    Joint swelling and itching    Social History: The patient  reports that he has quit smoking. His smoking use included cigars. He has never used smokeless tobacco. He reports that he does not drink alcohol or use drugs.   Family History: The patient's  family history includes Other in his father.   Review of  Systems: Please see the history of present illness.   Otherwise, the review of systems is positive for none.   All other systems are reviewed and negative.   Physical Exam: VS:  BP 116/62   Pulse 98   Ht 5\' 8"  (1.727 m)   Wt 269 lb (122 kg)   SpO2 97%   BMI 40.90 kg/m  .  BMI Body mass index is 40.9 kg/m.  Wt Readings from Last 3 Encounters:  01/30/18 269 lb (122 kg)  11/02/17 269 lb (122 kg)  10/24/17 261 lb 14.4 oz (118.8 kg)    General: Pleasant. Remains morbidly obese. He is alert and in no acute distress.  He looks older than his stated age and chronically ill.  HEENT: Normal.  Neck: Supple, no JVD, carotid bruits, or masses noted.  Cardiac: Irregular irregular rhythm. Rate is ok. No murmurs, rubs, or gallops. No edema.  Respiratory:  Lungs are clear to auscultation bilaterally with normal work of breathing.  GI: Soft and nontender.  MS: No deformity or atrophy. Gait and ROM intact.  Skin: Warm and dry. Color is normal.  Neuro:  Strength and sensation are intact and no gross focal deficits noted.  Psych: Alert, appropriate and with normal affect.   LABORATORY DATA:  EKG:  EKG is not ordered today.  Lab Results  Component  Value Date   WBC 7.0 10/28/2017   HGB 15.3 10/28/2017   HCT 44.2 10/28/2017   PLT 169 10/28/2017   GLUCOSE 120 (H) 10/27/2017   CHOL 195 02/28/2017   TRIG 167 (H) 02/28/2017   HDL 39 (L) 02/28/2017   LDLCALC 123 (H) 02/28/2017   ALT 35 10/22/2017   AST 30 10/22/2017   NA 140 10/27/2017   K 4.0 10/27/2017   CL 103 10/27/2017   CREATININE 0.88 10/27/2017   BUN 7 (L) 10/27/2017   CO2 29 10/27/2017   TSH 1.100 05/14/2013   PSA 1.52 03/21/2007   INR 1.26 10/22/2017   HGBA1C 6.0 (H) 05/14/2013     BNP (last 3 results) No results for input(s): BNP in the last 8760 hours.  ProBNP (last 3 results) Recent Labs    02/28/17 1621  PROBNP 2,250*     Other Studies Reviewed Today:  Echo Study Conclusions 02/2017  - Left ventricle: The cavity size was mildly dilated. Wall   thickness was increased in a pattern of mild LVH. Systolic   function was moderately to severely reduced. The estimated   ejection fraction was in the range of 30% to 35%. There is   akinesis of the inferolateral and inferior myocardium. There is   hypokinesis of the anteroseptal and inferoseptal myocardium.  Impressions:  - Technically difficult; definity used; akinesis of the inferior   and inferolateral walls with overall moderate to severe LV   dysfunction; mild LVH and LVE.   Assessment/Plan:  1. Persistent AF - managed with rate control and anticoagulation. He has seen EP in the past. Goal noted to have HR less than 120. Doing well overall. Lab today. No changes made.   2. Chronic anticoagulation - needs labs today - no problems noted.   3. Tachycardia/Ischemic CM with Chronic systolic HF - now on Entresto - doing well clinically. No changes made today.   4. CAD - managed medically. Looks like last cath from 2010. Some atypical chest pain - does not sound cardiac - relieved with antihistamines.   5. Morbid obesity - this remains the crux of his issues. Unfortunately, I  do not see this  changing.   6.  Chronic dyspnea - most likely multifactorial - noted to probably have some degree of restrictive lung disease from diaphragmatic hernia repair as a child/long smoking history/obesity.   7. HLD - on statin - needs labs - will check today.   Current medicines are reviewed with the patient today.  The patient does not have concerns regarding medicines other than what has been noted above.  The following changes have been made:  See above.  Labs/ tests ordered today include:    Orders Placed This Encounter  Procedures  . Basic metabolic panel  . CBC  . Hepatic function panel  . Lipid panel     Disposition:   FU with Dr. Marlou Porch in 4 months.    Patient is agreeable to this plan and will call if any problems develop in the interim.   SignedTruitt Merle, NP  01/30/2018 3:29 PM  [facility] [address] [place], [place]  [address] Phone: [phone] Fax: [phone]

## 2018-01-30 NOTE — Patient Instructions (Addendum)
We will be checking the following labs today - BMET, CBC, HPF and lipids   Medication Instructions:    Continue with your current medicines.    If you need a refill on your cardiac medications before your next appointment, please call your pharmacy.     Testing/Procedures To Be Arranged:  N/A  Follow-Up:   See Dr. Marlou Porch in about 4 months    At [facility], you and your health needs are our priority.  As part of our continuing mission to provide you with exceptional heart care, we have created designated Provider Care Teams.  These Care Teams include your primary Cardiologist (physician) and Advanced Practice Providers (APPs -  Physician Assistants and Nurse Practitioners) who all work together to provide you with the care you need, when you need it.  Special Instructions:  . None  Call the [place] office at [phone] if you have any questions, problems or concerns.

## 2018-01-31 LAB — LIPID PANEL
Chol/HDL Ratio: 4.8 ratio (ref 0.0–5.0)
Cholesterol, Total: 225 mg/dL — ABNORMAL HIGH (ref 100–199)
HDL: 47 mg/dL (ref >39)
LDL Calculated: 144 mg/dL — ABNORMAL HIGH (ref 0–99)
Triglycerides: 171 mg/dL — ABNORMAL HIGH (ref 0–149)
VLDL Cholesterol Cal: 34 mg/dL (ref 5–40)

## 2018-01-31 LAB — BASIC METABOLIC PANEL
BUN/Creatinine Ratio: 11 (ref 10–24)
BUN: 9 mg/dL (ref 8–27)
CO2: 21 mmol/L (ref 20–29)
Calcium: 9 mg/dL (ref 8.6–10.2)
Chloride: 104 mmol/L (ref 96–106)
Creatinine, Ser: 0.84 mg/dL (ref 0.76–1.27)
GFR calc Af Amer: 107 mL/min/{1.73_m2} (ref >59)
GFR calc non Af Amer: 93 mL/min/{1.73_m2} (ref >59)
Glucose: 182 mg/dL — ABNORMAL HIGH (ref 65–99)
Potassium: 4.8 mmol/L (ref 3.5–5.2)
Sodium: 138 mmol/L (ref 134–144)

## 2018-01-31 LAB — HEPATIC FUNCTION PANEL
ALT: 23 IU/L (ref 0–44)
AST: 20 IU/L (ref 0–40)
Albumin: 3.9 g/dL (ref 3.6–4.8)
Alkaline Phosphatase: 57 IU/L (ref 39–117)
Bilirubin Total: 0.9 mg/dL (ref 0.0–1.2)
Bilirubin, Direct: 0.26 mg/dL (ref 0.00–0.40)
Total Protein: 6.7 g/dL (ref 6.0–8.5)

## 2018-01-31 LAB — CBC
Hematocrit: 51.5 % — ABNORMAL HIGH (ref 37.5–51.0)
Hemoglobin: 17.4 g/dL (ref 13.0–17.7)
MCH: 31.1 pg (ref 26.6–33.0)
MCHC: 33.8 g/dL (ref 31.5–35.7)
MCV: 92 fL (ref 79–97)
Platelets: 211 10*3/uL (ref 150–450)
RBC: 5.59 x10E6/uL (ref 4.14–5.80)
RDW: 14 % (ref 11.6–15.4)
WBC: 7.1 10*3/uL (ref 3.4–10.8)

## 2018-02-02 ENCOUNTER — Other Ambulatory Visit: Payer: Self-pay | Admitting: Cardiology

## 2018-02-10 ENCOUNTER — Other Ambulatory Visit: Payer: Self-pay | Admitting: Cardiology

## 2018-02-10 ENCOUNTER — Telehealth: Payer: Self-pay | Admitting: Cardiology

## 2018-02-10 NOTE — Telephone Encounter (Signed)
New Message:    Patient calling about some medication side effects. Patient states he would like to try something else please call patient back.

## 2018-02-10 NOTE — Telephone Encounter (Signed)
Xarelto 20mg  refill request received; pt is 63 yrs old, wt-122kg, Crea-0.84 on 01/30/2018, last seen by Truitt Merle on 01/30/2018, CrCl-153.82ml/min; will send in refill to requested pharmacy.

## 2018-02-10 NOTE — Telephone Encounter (Signed)
Pt called top report tat he tried the Lipitor for 10 days and he had several episodes of diarrhea and muscle aches... he says that he does not want to be on a statin and to let us know that he will be trying red yeast rice as his mother has had good results with her Lipids on it.[name] advised him to continue to modify his diet as well and he will keep his OV with Dr. Marlou Porch 04/2018.

## 2018-04-27 ENCOUNTER — Other Ambulatory Visit: Payer: Self-pay | Admitting: Pulmonary Disease

## 2018-04-27 NOTE — Telephone Encounter (Signed)
LMTCB Pt overdue for office visit. Last ov:04/14/16

## 2018-05-02 ENCOUNTER — Telehealth: Payer: Self-pay

## 2018-05-02 NOTE — Telephone Encounter (Signed)
Virtual Visit Pre-Appointment Phone Call  Steps For Call:  1. Confirm consent - "In the setting of the current Covid19 crisis, you are scheduled for a (phone or video) visit with your provider on (date) at (time).  Just as we do with many in-office visits, in order for you to participate in this visit, we must obtain consent.  If you'd like, I can send this to your mychart (if signed up) or email for you to review.  Otherwise, I can obtain your verbal consent now.  All virtual visits are billed to your insurance company just like a normal visit would be.  By agreeing to a virtual visit, we'd like you to understand that the technology does not allow for your provider to perform an examination, and thus may limit your provider's ability to fully assess your condition.  Finally, though the technology is pretty good, we cannot assure that it will always work on either your or our end, and in the setting of a video visit, we may have to convert it to a phone-only visit.  In either situation, we cannot ensure that we have a secure connection.  Are you willing to proceed?"  2. Give patient instructions for WebEx download to smartphone as below if video visit  3. Advise patient to be prepared with any vital sign or heart rhythm information, their current medicines, and a piece of paper and pen handy for any instructions they may receive the day of their visit  4. Inform patient they will receive a phone call 15 minutes prior to their appointment time (may be from unknown caller ID) so they should be prepared to answer  5. Confirm that appointment type is correct in Epic appointment notes (video vs telephone)    TELEPHONE CALL NOTE  Norman Rodriguez has been deemed a candidate for a follow-up tele-health visit to limit community exposure during the Covid-19 pandemic. I spoke with the patient via phone to ensure availability of phone/video source, confirm preferred email & phone number, and discuss  instructions and expectations.  I reminded Norman Rodriguez to be prepared with any vital sign and/or heart rhythm information that could potentially be obtained via home monitoring, at the time of his visit. I reminded Norman Rodriguez to expect a phone call at the time of his visit if his visit.  Did the patient verbally acknowledge consent to treatment? [place], [place] 05/02/2018 4:17 PM   DOWNLOADING THE [place], go to [employer] and type in WebEx in the search bar. [place] [employer], the blue/green circle. The app is free but as with any other app downloads, their phone may require them to verify saved payment information or Apple password. The patient does NOT have to create an account.  - If Android, ask patient to go to [employer] and type in WebEx in the search bar. [place] [employer], the blue/green circle. The app is free but as with any other app downloads, their phone may require them to verify saved payment information or Android password. The patient does NOT have to create an account.   CONSENT FOR TELE-HEALTH VISIT - PLEASE REVIEW  I hereby voluntarily request, consent and authorize [facility] and its employed or contracted physicians, physician assistants, nurse practitioners or other licensed health care professionals (the Practitioner), to provide me with telemedicine health care services (the "Services") as deemed necessary by the treating  Practitioner. I acknowledge and consent to receive the Services by the Practitioner via telemedicine. I understand that the telemedicine visit will involve communicating with the Practitioner through live audiovisual communication technology and the disclosure of certain medical information by electronic transmission. I acknowledge that I have been given the opportunity to request an in-person assessment or other available alternative prior to the telemedicine  visit and am voluntarily participating in the telemedicine visit.  I understand that I have the right to withhold or withdraw my consent to the use of telemedicine in the course of my care at any time, without affecting my right to future care or treatment, and that the Practitioner or I may terminate the telemedicine visit at any time. I understand that I have the right to inspect all information obtained and/or recorded in the course of the telemedicine visit and may receive copies of available information for a reasonable fee.  I understand that some of the potential risks of receiving the Services via telemedicine include:  [name] Delay or interruption in medical evaluation due to technological equipment failure or disruption; . Information transmitted may not be sufficient (e.g. poor resolution of images) to allow for appropriate medical decision making by the Practitioner; and/or  . In rare instances, security protocols could fail, causing a breach of personal health information.  Furthermore, I acknowledge that it is my responsibility to provide information about my medical history, conditions and care that is complete and accurate to the best of my ability. I acknowledge that Practitioner's advice, recommendations, and/or decision may be based on factors not within their control, such as incomplete or inaccurate data provided by me or distortions of diagnostic images or specimens that may result from electronic transmissions. I understand that the practice of medicine is not an exact science and that Practitioner makes no warranties or guarantees regarding treatment outcomes. I acknowledge that I will receive a copy of this consent concurrently upon execution via email to the email address I last provided but may also request a printed copy by calling the office of [facility].    I understand that my insurance will be billed for this visit.   I have read or had this consent read to me. . I  understand the contents of this consent, which adequately explains the benefits and risks of the Services being provided via telemedicine.  . I have been provided ample opportunity to ask questions regarding this consent and the Services and have had my questions answered to my satisfaction. . I give my informed consent for the services to be provided through the use of telemedicine in my medical care  By participating in this telemedicine visit I agree to the above.

## 2018-05-09 ENCOUNTER — Encounter: Payer: Self-pay | Admitting: Cardiology

## 2018-05-09 ENCOUNTER — Other Ambulatory Visit: Payer: Self-pay

## 2018-05-09 ENCOUNTER — Telehealth (INDEPENDENT_AMBULATORY_CARE_PROVIDER_SITE_OTHER): Payer: Medicare Other | Admitting: Cardiology

## 2018-05-09 VITALS — Ht 68.0 in | Wt 270.0 lb

## 2018-05-09 DIAGNOSIS — I5022 Chronic systolic (congestive) heart failure: Secondary | ICD-10-CM | POA: Diagnosis not present

## 2018-05-09 DIAGNOSIS — I4819 Other persistent atrial fibrillation: Secondary | ICD-10-CM

## 2018-05-09 NOTE — Patient Instructions (Signed)
Medication Instructions:  The current medical regimen is effective;  continue present plan and medications.  If you need a refill on your cardiac medications before your next appointment, please call your pharmacy.   Follow-Up: Follow up in 4 months with Truitt Merle, NP.  Thank you for choosing [facility]!!

## 2018-05-09 NOTE — Progress Notes (Signed)
Virtual Visit via Video Note   This visit type was conducted due to national recommendations for restrictions regarding the COVID-19 Pandemic (e.g. social distancing) in an effort to limit this patient's exposure and mitigate transmission in our community.  Due to his co-morbid illnesses, this patient is at least at moderate risk for complications without adequate follow up.  This format is felt to be most appropriate for this patient at this time.  All issues noted in this document were discussed and addressed.  He had challenges obtaining my video.  I was able to see him however.  We converted to telephone based visit.  A limited physical exam was performed with this format.  Please refer to the patient's chart for his consent to telehealth for [facility].   Evaluation Performed:  Follow-up visit  Date:  05/09/2018   ID:  Norman Rodriguez, DOB 1955-12-22, MRN [id number]  Patient Location: Home  Provider Location: Home  PCP:  [facility] (Inactive)  Cardiologist:  [name], MD  Electrophysiologist:  Cristopher Peru, MD   Chief Complaint: Follow-up heart failure  History of Present Illness:    Norman Rodriguez is a 63 y.o. male who presents via audio/video conferencing for a telehealth visit today.    Has ischemic/tachycardia mediated cardiomyopathy, prior EF 30% with permanent atrial fibrillation coronary artery disease hypertension hyperlipidemia morbid obesity.  Failed Tikosyn in the past as well as amiodarone and multiple cardioversions.  Good rate control strategy with anticoagulation.  Once again, symptoms had been fairly consistent over several visits.  Shortness of breath with activity is not uncommon.  No chest pain. Actually SOB is improved.   In July 2019 had melena, colonoscopy with multiple polyps, Xarelto was held then restarted.  Once again believes that Delene Loll has helped out.  Weight continues to be up.  Very rare atypical CP.  He  asked me about hydroxychloroquine and its prevention of COVID-19.  We discussed how this is not a recommendation at this time.  Further trials are ongoing.  The patient does not have symptoms concerning for COVID-19 infection (fever, chills, cough, or new shortness of breath).    Past Medical History:  Diagnosis Date  . Arthritis   . Atrial fibrillation ([place])    a. Dx 2010. b. Initiated on Tikosyn 03/2013.  [name] CAD (coronary artery disease)    a. BMS 2.65mm 23 to LAD. CTO of RCA unsuccessful, good left to right collaterals - 2010.  [name] Chronic anticoagulation   . Chronic systolic CHF (congestive heart failure) ([place])    a. Both ischemic and tachy induced. b. Echo 04/2013: EF 30-35%, no RWMA, normal RV, mod dilated LA (before restoration of NSR).  . COPD (chronic obstructive pulmonary disease) ([place]) 10/22/2017  . Dysrhythmia    hx of atrial fibrilation  . HTN (hypertension)   . Hyperlipidemia   . NSVT (nonsustained ventricular tachycardia) ([place])   . Obesity   . Shortness of breath    Past Surgical History:  Procedure Laterality Date  . CARDIOVERSION N/A 04/27/2013   Procedure: BEDSIDE CARDIOVERSION;  Surgeon: Sanda Klein, MD;  Location: [facility];  Service: Cardiovascular;  Laterality: N/A;  . CARDIOVERSION N/A 05/15/2013   Procedure: CARDIOVERSION/BEDSIDE;  Surgeon: Larey Dresser, MD;  Location: [facility];  Service: Cardiovascular;  Laterality: N/A;  . CARDIOVERSION N/A 05/16/2014   Procedure: CARDIOVERSION;  Surgeon: Jerline Pain, MD;  Location: [facility] ENDOSCOPY;  Service: Cardiovascular;  Laterality: N/A;  . CARDIOVERSION N/A 06/07/2014  Procedure: CARDIOVERSION;  Surgeon: Jerline Pain, MD;  Location: [facility];  Service: Cardiovascular;  Laterality: N/A;  . COLONOSCOPY WITH PROPOFOL N/A 10/27/2017   Procedure: COLONOSCOPY WITH PROPOFOL;  Surgeon: Mauri Pole, MD;  Location: [facility] ENDOSCOPY;  Service: Endoscopy;  Laterality: N/A;  . CORONARY ANGIOPLASTY WITH STENT PLACEMENT  2010   BMS  2.50mm 23 to LAD. CTO of RCA unsuccessful, good left to right collaterals  . ESOPHAGOGASTRODUODENOSCOPY (EGD) WITH PROPOFOL N/A 10/24/2017   Procedure: ESOPHAGOGASTRODUODENOSCOPY (EGD) WITH PROPOFOL;  Surgeon: Mauri Pole, MD;  Location: [facility] ENDOSCOPY;  Service: Endoscopy;  Laterality: N/A;  . FOOT SURGERY Right   . POLYPECTOMY  10/27/2017   Procedure: POLYPECTOMY;  Surgeon: Mauri Pole, MD;  Location: [facility] ENDOSCOPY;  Service: Endoscopy;;     Current Meds  Medication Sig  . ANORO ELLIPTA 62.5-25 MCG/INH AEPB INHALE ONE PUFFS INTO THE LUNGS DAILY  . atorvastatin (LIPITOR) 40 MG tablet Take 40 mg by mouth daily.  . B Complex-C (B-COMPLEX WITH VITAMIN C) tablet Take 1 tablet by mouth daily.  . carvedilol (COREG) 12.5 MG tablet TAKE 1 TABLET (12.5 MG TOTAL) BY MOUTH TWO TIMES DAILY WITH A MEAL.  [name] Coenzyme Q10 (CO Q-10) 200 MG CAPS Take 1 capsule by mouth daily.  . fexofenadine (ALLEGRA) 180 MG tablet Take 180 mg by mouth daily.  . furosemide (LASIX) 40 MG tablet Take 0.5-1.5 tablets (20-60 mg total) by mouth daily. Take an additional 40 mg by mouth as needed for fluid weight gain.  [name] gabapentin (NEURONTIN) 300 MG capsule Take 600 mg by mouth as needed (carpel tunnel).   . LevOCARNitine (CARNITINE PO) Take 500 mg by mouth daily.  . Magnesium 400 MG CAPS Take 1 capsule by mouth daily.   . meloxicam (MOBIC) 15 MG tablet Take 15 mg by mouth daily.  . Multiple Vitamin (MULTIVITAMIN) tablet Take 1 tablet by mouth daily.  . nitroGLYCERIN (NITROSTAT) 0.4 MG SL tablet Place 0.4 mg under the tongue every 5 (five) minutes as needed for chest pain.  . NON FORMULARY Take 1 capsule by mouth daily. (Ribose) Take 1 capsule once a day   . NON FORMULARY Take 1 tablet by mouth daily. Glycine vitamin 1 tab po qd  . NON FORMULARY Take 465 mg by mouth as needed (joint pain). [place]   . Omega-3 Fatty Acids (OMEGA 3 PO) Take 2 capsules by mouth daily.   . potassium chloride SA  (K-DUR,KLOR-CON) 20 MEQ tablet TAKE ONE TABLET BY MOUTH DAILY  . Red Yeast Rice POWD by Does not apply route.  . sacubitril-valsartan (ENTRESTO) 49-51 MG Take 1 tablet by mouth 2 (two) times daily.  . vitamin C (ASCORBIC ACID) 500 MG tablet Take 500 mg by mouth daily.  [name] VITAMIN D, CHOLECALCIFEROL, PO Take 2 tablets by mouth daily.  Alveda Reasons 20 MG TABS tablet TAKE 1 TABLET (20 MG TOTAL) BY MOUTH DAILY WITH SUPPER.     Allergies:   Apple; Soybean oil; Lisinopril; Other; Bean pod extract; Carrot [daucus carota]; Grass extracts [gramineae pollens]; Peach flavor; and Tree extract   Social History   Tobacco Use  . Smoking status: Former Smoker    Types: Cigars  . Smokeless tobacco: Never Used  . Tobacco comment: 1 cigar per month states he stopped smoking  Substance Use Topics  . Alcohol use: No  . Drug use: No    Comment: 1 per month...pt states he stopped smoking     Family Hx: The patient's  family history includes Other in his father. There is no history of Coronary artery disease.  ROS:   Please see the history of present illness.    Denies any fevers chills nausea vomiting shortness of breath that has increased, bleeding syncope All other systems reviewed and are negative.   Prior CV studies:   The following studies were reviewed today:  Echocardiogram 02/2017-EF 30% with akinesis of the inferior lateral walls.  Labs/Other Tests and Data Reviewed:    EKG:  An ECG dated 02/28/2017 was personally reviewed today and demonstrated:  Atrial fibrillation 104 bpm  Recent Labs: 01/30/2018: ALT 23; BUN 9; Creatinine, Ser 0.84; Hemoglobin 17.4; Platelets 211; Potassium 4.8; Sodium 138   Recent Lipid Panel Lab Results  Component Value Date/Time   CHOL 225 (H) 01/30/2018 03:25 PM   TRIG 171 (H) 01/30/2018 03:25 PM   HDL 47 01/30/2018 03:25 PM   CHOLHDL 4.8 01/30/2018 03:25 PM   CHOLHDL 4.3 05/15/2013 01:18 AM   LDLCALC 144 (H) 01/30/2018 03:25 PM    Wt Readings from Last 3  Encounters:  05/09/18 270 lb (122.5 kg)  01/30/18 269 lb (122 kg)  11/02/17 269 lb (122 kg)     Objective:    Vital Signs:  Ht 5\' 8"  (1.727 m)   Wt 270 lb (122.5 kg)   BMI 41.05 kg/m    Well nourished, well developed male in no acute distress. Normal respiratory efforts. Full sentences.   ASSESSMENT & PLAN:    Permanent atrial fibrillation - Continue with rate control strategy.  Failed antiarrhythmics, cardioversion, not felt to be a good candidate for ablative therapy.  Chronic anticoagulation -Continue with Xarelto.  No obvious bleeding.  He has had colonic, polyp bleeding in the past in which a Xarelto had to be held for a few days.  This is not returned.  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  - Continue with aggressive secondary prevention strategies.  Entresto.  Previously on losartan.  Previously stopped digoxin.  Good rate control.  Morbid obesity -Continue to encourage weight loss.  Dietary modifications.  Exercise.  Carotid artery plaque - Ultrasound of carotids 10/25/2017 showed minimal plaque right and left carotid arteries.  1 to 39%.  Secondary prevention.  Coronary artery disease - Prior cardiomyopathy felt to be out of proportion to coronary artery disease present.  Continue with aggressive secondary prevention strategies.  Ongoing dyspnea -Pulmonary consultation has taken place in the past.  Has had some restrictive lung disease from diaphragmatic hernia as a child as well as morbid obesity.  There is been greater than 10 years ago.  COVID-19 Education: The signs and symptoms of COVID-19 were discussed with the patient and how to seek care for testing (follow up with PCP or arrange E-visit).  The importance of social distancing was discussed today.  Time:   Today, I have spent 25 minutes with the patient with telehealth technology discussing the above problems.     Medication Adjustments/Labs and Tests Ordered: Current  medicines are reviewed at length with the patient today.  Concerns regarding medicines are outlined above.   Tests Ordered: No orders of the defined types were placed in this encounter.   Medication Changes: No orders of the defined types were placed in this encounter.   Disposition:  Follow up in 4 month(s) Sindy Guadeloupe, [name], MD  05/09/2018 12:27 PM    [facility]

## 2018-05-11 ENCOUNTER — Ambulatory Visit: Payer: Medicare Other | Admitting: Cardiology

## 2018-05-17 ENCOUNTER — Other Ambulatory Visit: Payer: Self-pay | Admitting: Pulmonary Disease

## 2018-06-30 ENCOUNTER — Other Ambulatory Visit: Payer: Self-pay | Admitting: Cardiology

## 2018-08-07 ENCOUNTER — Other Ambulatory Visit: Payer: Self-pay | Admitting: Cardiology

## 2018-09-22 NOTE — Progress Notes (Signed)
CARDIOLOGY OFFICE NOTE  Date:  09/25/2018    Norman Rodriguez Date of Birth: November 07, 1955 Medical Record [id number]  PCP:  [facility] (Inactive)  Cardiologist:  Marisa Cyphers   Chief Complaint  Patient presents with  . Follow-up    History of Present Illness: Norman Rodriguez is a 63 y.o. male who presents today for a 5 month check. Seen for Dr. Marlou Porch.   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   He failed Tikosyn, amiodarone, and multiple cardioversions. We are now in rate control strategy along with his anticoagulation.   Complaints have been fairly consistent over the last several visits, shortness of breath with activity. He does better with the cooler weather/AC environment. He was hospitalized in July of 2019 with melana - found to have multiple polyps - Xarelto was held for a short period. I last saw him in July. He had a telehealth visit with Dr. Marlou Porch in April.   The patient does not have symptoms concerning for COVID-19 infection (fever, chills, cough, or new shortness of breath).   Comes in today. Here alone. He is rather chatty today. Doing ok. Shortness of breath is persistent. Always worse with the warm weather - chronic. His complaints seem unchanged. Weight is stable. No real chest pain. Not very active - this is chronic. Noted that his PSA back in May was quite elevated - says he has follow up planned with Urology next month.   Past Medical History:  Diagnosis Date  . Arthritis   . Atrial fibrillation ([place])    a. Dx 2010. b. Initiated on Tikosyn 03/2013.  [name] CAD (coronary artery disease)    a. BMS 2.47mm 23 to LAD. CTO of RCA unsuccessful, good left to right collaterals - 2010.  [name] Chronic anticoagulation   . Chronic systolic CHF (congestive heart failure) ([place])    a. Both  ischemic and tachy induced. b. Echo 04/2013: EF 30-35%, no RWMA, normal RV, mod dilated LA (before restoration of NSR).  . COPD (chronic obstructive pulmonary disease) ([place]) 10/22/2017  . Dysrhythmia    hx of atrial fibrilation  . HTN (hypertension)   . Hyperlipidemia   . NSVT (nonsustained ventricular tachycardia) ([place])   . Obesity   . Shortness of breath     Past Surgical History:  Procedure Laterality Date  . CARDIOVERSION N/A 04/27/2013   Procedure: BEDSIDE CARDIOVERSION;  Surgeon: Sanda Klein, MD;  Location: [facility];  Service: Cardiovascular;  Laterality: N/A;  . CARDIOVERSION N/A 05/15/2013   Procedure: CARDIOVERSION/BEDSIDE;  Surgeon: Larey Dresser, MD;  Location: [facility];  Service: Cardiovascular;  Laterality: N/A;  . CARDIOVERSION N/A 05/16/2014   Procedure: CARDIOVERSION;  Surgeon: Jerline Pain, MD;  Location: [facility];  Service: Cardiovascular;  Laterality: N/A;  . CARDIOVERSION N/A 06/07/2014   Procedure: CARDIOVERSION;  Surgeon: Jerline Pain, MD;  Location: [facility];  Service: Cardiovascular;  Laterality: N/A;  . COLONOSCOPY WITH PROPOFOL N/A 10/27/2017   Procedure: COLONOSCOPY WITH PROPOFOL;  Surgeon: Mauri Pole, MD;  Location: [facility];  Service: Endoscopy;  Laterality: N/A;  . CORONARY ANGIOPLASTY WITH STENT PLACEMENT  2010   BMS 2.62mm 23 to LAD. CTO of RCA unsuccessful, good left to right collaterals  . ESOPHAGOGASTRODUODENOSCOPY (EGD) WITH PROPOFOL N/A 10/24/2017   Procedure: ESOPHAGOGASTRODUODENOSCOPY (EGD) WITH PROPOFOL;  Surgeon: Mauri Pole, MD;  Location: [facility];  Service: Endoscopy;  Laterality: N/A;  . FOOT SURGERY Right   . POLYPECTOMY  10/27/2017   Procedure: POLYPECTOMY;  Surgeon: Mauri Pole, MD;  Location: [facility];  Service: Endoscopy;;     Medications: Current Meds  Medication Sig  . ANORO ELLIPTA 62.5-25 MCG/INH AEPB INHALE ONE PUFFS INTO THE LUNGS DAILY  . atorvastatin (LIPITOR) 40 MG tablet Take 40 mg by  mouth daily.  . B Complex-C (B-COMPLEX WITH VITAMIN C) tablet Take 1 tablet by mouth daily.  . carvedilol (COREG) 12.5 MG tablet TAKE ONE TABLET BY MOUTH TWICE A DAY WITH MEALS  . Coenzyme Q10 (CO Q-10) 200 MG CAPS Take 1 capsule by mouth daily.  [name] ENTRESTO 49-51 MG TAKE 1 TABLET BY MOUTH TWO (TWO) TIMES DAILY.  . fexofenadine (ALLEGRA) 180 MG tablet Take 180 mg by mouth daily.  . furosemide (LASIX) 40 MG tablet TAKE 1 TABLET (40 MG TOTAL) BY MOUTH DAILY.  [name] gabapentin (NEURONTIN) 300 MG capsule Take 600 mg by mouth as needed (carpel tunnel).   . LevOCARNitine (CARNITINE PO) Take 500 mg by mouth daily.  . Magnesium 400 MG CAPS Take 1 capsule by mouth daily.   . meloxicam (MOBIC) 15 MG tablet Take 15 mg by mouth daily.  . Multiple Vitamin (MULTIVITAMIN) tablet Take 1 tablet by mouth daily.  . nitroGLYCERIN (NITROSTAT) 0.4 MG SL tablet Place 0.4 mg under the tongue every 5 (five) minutes as needed for chest pain.  . NON FORMULARY Take 1 capsule by mouth daily. (Ribose) Take 1 capsule once a day   . NON FORMULARY Take 1 tablet by mouth daily. Glycine vitamin 1 tab po qd  . NON FORMULARY Take 465 mg by mouth as needed (joint pain). [place]   . Omega-3 Fatty Acids (OMEGA 3 PO) Take 2 capsules by mouth daily.   . potassium chloride SA (K-DUR,KLOR-CON) 20 MEQ tablet TAKE ONE TABLET BY MOUTH DAILY  . Red Yeast Rice POWD by Does not apply route.  . vitamin C (ASCORBIC ACID) 500 MG tablet Take 500 mg by mouth daily.  [name] VITAMIN D, CHOLECALCIFEROL, PO Take 2 tablets by mouth daily.  Alveda Reasons 20 MG TABS tablet TAKE 1 TABLET (20 MG TOTAL) BY MOUTH DAILY WITH SUPPER.     Allergies: Allergies  Allergen Reactions  . Apple Hives  . Soybean Oil Other (See Comments)    unknown  . Lisinopril Cough  . Other Itching    Pt has itching and joint swelling with walnuts, mildew, fungi, basil and peaches  . Bean Pod Extract Rash    Green Beans specifically Joint swelling and itching  . Carrot  [Daucus Carota] Rash    Joint swelling and itching  . Grass Extracts [Gramineae Pollens] Rash    Joint swelling and itching  . Peach Flavor Rash    Joint swelling and itching  . Tree Extract Rash    Joint swelling and itching    Social History: The patient  reports that he has quit smoking. His smoking use included cigars. He has never used smokeless tobacco. He reports that he does not drink alcohol or use drugs.   Family History: The patient's family history includes Other in his father.   Review of Systems: Please see the history of present illness.   All other systems are reviewed and negative.   Physical Exam: VS:  BP 138/78   Pulse 92   Ht 5\' 8"  (1.727 m)   Wt 269 lb 12.8  oz (122.4 kg)   BMI 41.02 kg/m  .  BMI Body mass index is 41.02 kg/m.  Wt Readings from Last 3 Encounters:  09/25/18 269 lb 12.8 oz (122.4 kg)  05/09/18 270 lb (122.5 kg)  01/30/18 269 lb (122 kg)    General: Alert and in no acute distress. Looks older than his stated age.   HEENT: Normal.  Neck: Supple, no JVD, carotid bruits, or masses noted.  Cardiac: Regular rate and rhythm. No murmurs, rubs, or gallops. No edema.  Respiratory:  Lungs are clear to auscultation bilaterally with normal work of breathing.  GI: Soft and nontender. Abdomen is obese.   MS: No deformity or atrophy. Gait and ROM intact.  Skin: Warm and dry. Color is normal.  Neuro:  Strength and sensation are intact and no gross focal deficits noted.  Psych: Alert, appropriate and with normal affect.   LABORATORY DATA:  EKG:  EKG is ordered today. This shows AF with rate of 92.   Lab Results  Component Value Date   WBC 7.1 01/30/2018   HGB 17.4 01/30/2018   HCT 51.5 (H) 01/30/2018   PLT 211 01/30/2018   GLUCOSE 182 (H) 01/30/2018   CHOL 225 (H) 01/30/2018   TRIG 171 (H) 01/30/2018   HDL 47 01/30/2018   LDLCALC 144 (H) 01/30/2018   ALT 23 01/30/2018   AST 20 01/30/2018   NA 138 01/30/2018   K 4.8 01/30/2018   CL 104  01/30/2018   CREATININE 0.84 01/30/2018   BUN 9 01/30/2018   CO2 21 01/30/2018   TSH 1.100 05/14/2013   PSA 1.52 03/21/2007   INR 1.26 10/22/2017   HGBA1C 6.0 (H) 05/14/2013     BNP (last 3 results) No results for input(s): BNP in the last 8760 hours.  ProBNP (last 3 results) No results for input(s): PROBNP in the last 8760 hours.   Other Studies Reviewed Today:  Echo Study Conclusions 02/2017  - Left ventricle: The cavity size was mildly dilated. Wall thickness was increased in a pattern of mild LVH. Systolic function was moderately to severely reduced. The estimated ejection fraction was in the range of 30% to 35%. There is akinesis of the inferolateral and inferior myocardium. There is hypokinesis of the anteroseptal and inferoseptal myocardium.  Impressions:  - Technically difficult; definity used; akinesis of the inferior and inferolateral walls with overall moderate to severe LV dysfunction; mild LVH and LVE.   Assessment/Plan:  1.Persistent AF - managed with rate control and continued anticoagulation - failed prior AAD therapy and cardioversion - Goal is to have HR < 120 - HR is ok today - his symptoms seem fairly stable. No changes made today.   2. Chronic anticoagulation - on Xarelto - needs surveillance labs today.   3.Tachycardia/Ischemic CM - on good therapy - no changes made. Symptoms seem stable - chronic.   4. Chronic systolic HF - see above.   5. CAD -  Looks like last cath from 2010. Remote PCI to the LAD and has CTO of the RCA - no active symptoms noted - would favor continued medical management.   6. Obesity - remains the crux of his issues. Unfortunately do not see this changing.   7. Chronic shortness of breath - multifactorial and noted to probably have some degree of restrictive lung disease from diaphragmatic hernia repair as a child/long smoking history/obesity. This seems unchanged.   8. HLD  9. PSA - this was elevated  back in May - for repeat  next month - little worrisome. Will have to see how this turns out. Will be available as needed.   10. Prior GI bleeding - this has not recurred - needs labs   11. COVID-19 Education: The signs and symptoms of COVID-19 were discussed with the patient and how to seek care for testing (follow up with PCP or arrange E-visit).  The importance of social distancing, staying at home, hand hygiene and wearing a mask when out in public were discussed today.  Current medicines are reviewed with the patient today.  The patient does not have concerns regarding medicines other than what has been noted above.  The following changes have been made:  See above.  Labs/ tests ordered today include:    Orders Placed This Encounter  Procedures  . Basic metabolic panel  . CBC  . Hepatic function panel  . Lipid panel     Disposition:   FU with [place] in about 4 months.   Patient is agreeable to this plan and will call if any problems develop in the interim.   SignedTruitt Merle, NP  09/25/2018 3:20 PM  [facility] [address] [place], [place]  [address] Phone: [phone] Fax: [phone]

## 2018-09-25 ENCOUNTER — Encounter (INDEPENDENT_AMBULATORY_CARE_PROVIDER_SITE_OTHER): Payer: Self-pay

## 2018-09-25 ENCOUNTER — Ambulatory Visit (INDEPENDENT_AMBULATORY_CARE_PROVIDER_SITE_OTHER): Payer: Medicare Other | Admitting: Nurse Practitioner

## 2018-09-25 ENCOUNTER — Encounter: Payer: Self-pay | Admitting: Nurse Practitioner

## 2018-09-25 ENCOUNTER — Other Ambulatory Visit: Payer: Self-pay

## 2018-09-25 VITALS — BP 138/78 | HR 92 | Ht 68.0 in | Wt 269.8 lb

## 2018-09-25 DIAGNOSIS — Z7189 Other specified counseling: Secondary | ICD-10-CM

## 2018-09-25 DIAGNOSIS — I5022 Chronic systolic (congestive) heart failure: Secondary | ICD-10-CM | POA: Diagnosis not present

## 2018-09-25 DIAGNOSIS — I4819 Other persistent atrial fibrillation: Secondary | ICD-10-CM

## 2018-09-25 DIAGNOSIS — E7849 Other hyperlipidemia: Secondary | ICD-10-CM | POA: Diagnosis not present

## 2018-09-25 DIAGNOSIS — R0602 Shortness of breath: Secondary | ICD-10-CM

## 2018-09-25 NOTE — Patient Instructions (Addendum)
After Visit Summary:  We will be checking the following labs today - BMET, CBC, HPF, lipids   Medication Instructions:    Continue with your current medicines.    If you need a refill on your cardiac medications before your next appointment, please call your pharmacy.     Testing/Procedures To Be Arranged:  N/A  Follow-Up:   See Dr. Marlou Porch in 4 months.     At [facility], you and your health needs are our priority.  As part of our continuing mission to provide you with exceptional heart care, we have created designated Provider Care Teams.  These Care Teams include your primary Cardiologist (physician) and Advanced Practice Providers (APPs -  Physician Assistants and Nurse Practitioners) who all work together to provide you with the care you need, when you need it.  Special Instructions:  . Stay safe, stay home, wash your hands for at least 20 seconds and wear a mask when out in public.  . It was good to talk with you today.    Call the [place] office at [phone] if you have any questions, problems or concerns.

## 2018-09-26 LAB — HEPATIC FUNCTION PANEL
ALT: 39 IU/L (ref 0–44)
AST: 36 IU/L (ref 0–40)
Albumin: 4.2 g/dL (ref 3.8–4.8)
Alkaline Phosphatase: 61 IU/L (ref 39–117)
Bilirubin Total: 0.7 mg/dL (ref 0.0–1.2)
Bilirubin, Direct: 0.2 mg/dL (ref 0.00–0.40)
Total Protein: 7.1 g/dL (ref 6.0–8.5)

## 2018-09-26 LAB — BASIC METABOLIC PANEL
BUN/Creatinine Ratio: 12 (ref 10–24)
BUN: 11 mg/dL (ref 8–27)
CO2: 17 mmol/L — ABNORMAL LOW (ref 20–29)
Calcium: 9.6 mg/dL (ref 8.6–10.2)
Chloride: 100 mmol/L (ref 96–106)
Creatinine, Ser: 0.94 mg/dL (ref 0.76–1.27)
GFR calc Af Amer: 98 mL/min/{1.73_m2} (ref >59)
GFR calc non Af Amer: 85 mL/min/{1.73_m2} (ref >59)
Glucose: 96 mg/dL (ref 65–99)
Potassium: 5.3 mmol/L — ABNORMAL HIGH (ref 3.5–5.2)
Sodium: 139 mmol/L (ref 134–144)

## 2018-09-26 LAB — LIPID PANEL
Chol/HDL Ratio: 4 ratio (ref 0.0–5.0)
Cholesterol, Total: 230 mg/dL — ABNORMAL HIGH (ref 100–199)
HDL: 57 mg/dL (ref >39)
LDL Chol Calc (NIH): 146 mg/dL — ABNORMAL HIGH (ref 0–99)
Triglycerides: 152 mg/dL — ABNORMAL HIGH (ref 0–149)
VLDL Cholesterol Cal: 27 mg/dL (ref 5–40)

## 2018-09-26 LAB — CBC
Hematocrit: 51.5 % — ABNORMAL HIGH (ref 37.5–51.0)
Hemoglobin: 17.5 g/dL (ref 13.0–17.7)
MCH: 32.5 pg (ref 26.6–33.0)
MCHC: 34 g/dL (ref 31.5–35.7)
MCV: 96 fL (ref 79–97)
Platelets: 224 10*3/uL (ref 150–450)
RBC: 5.38 x10E6/uL (ref 4.14–5.80)
RDW: 13.3 % (ref 11.6–15.4)
WBC: 7.5 10*3/uL (ref 3.4–10.8)

## 2018-09-27 ENCOUNTER — Other Ambulatory Visit: Payer: Self-pay | Admitting: *Deleted

## 2018-09-27 MED ORDER — RED YEAST RICE 600 MG PO CAPS: 1200.0000 mg | ORAL_CAPSULE | Freq: Two times a day (BID) | ORAL | 3 refills | Status: DC

## 2018-09-27 MED ORDER — ATORVASTATIN CALCIUM 40 MG PO TABS: ORAL_TABLET | ORAL | 9 refills | Status: DC

## 2018-09-28 NOTE — Progress Notes (Signed)
Agree with plan Jonhatan Hearty, MD  

## 2018-10-10 ENCOUNTER — Telehealth: Payer: Self-pay | Admitting: Cardiology

## 2018-10-10 NOTE — Telephone Encounter (Signed)
  Patient is calling because Norman Rodriguez asked him what he was going to do about the PSA for his prostrate gland reading that was high. He went yesterday and got a good report from Dr Tammi Klippel at [facility]. He went from a 10 to  4.2. He will go back in 6 months for a follow up.

## 2018-10-10 NOTE — Telephone Encounter (Signed)
Noted.   Norman Rodriguez 

## 2018-11-04 ENCOUNTER — Other Ambulatory Visit: Payer: Self-pay | Admitting: Cardiology

## 2019-01-03 ENCOUNTER — Telehealth: Payer: Self-pay | Admitting: Cardiology

## 2019-01-03 NOTE — Telephone Encounter (Signed)
New message   No issues Patient wants to know what should his pulse and oxygen typically be.

## 2019-01-03 NOTE — Telephone Encounter (Signed)
Answered all pts questions.

## 2019-01-04 ENCOUNTER — Ambulatory Visit: Payer: Medicare Other | Admitting: Cardiology

## 2019-01-04 ENCOUNTER — Other Ambulatory Visit: Payer: Self-pay | Admitting: Pulmonary Disease

## 2019-01-12 ENCOUNTER — Other Ambulatory Visit: Payer: Self-pay | Admitting: Pulmonary Disease

## 2019-01-12 ENCOUNTER — Telehealth: Payer: Self-pay | Admitting: Pulmonary Disease

## 2019-01-12 NOTE — Telephone Encounter (Signed)
[place] and spoke with [name] Patient is requesting a refill on his Anoro  Patient has not been seen in the office since 2018 Per [name] patient has not been filling his Anoro regularly - refilled last month but prior to that, it has not been since May 2020 (last Rx sent was 4.22.20 #1 inhaler with 3 refills).  Asked [name] to please have patient call the office to schedule follow up appt in order to receive refills.  Nothing further needed at this time; will sign off.

## 2019-01-22 ENCOUNTER — Telehealth: Payer: Self-pay | Admitting: Pulmonary Disease

## 2019-01-22 MED ORDER — ANORO ELLIPTA 62.5-25 MCG/INH IN AEPB: 1.0000 | INHALATION_SPRAY | Freq: Every day | RESPIRATORY_TRACT | 0 refills | Status: DC

## 2019-01-22 NOTE — Telephone Encounter (Signed)
Pt has not been seen since 2018, we cancelled his appt for tomorrow and did not reschedule him.[name] Spoke with pt, states that since he cannot be seen tomorrow he is needing to go out of town to tend to his mother-in-law's funeral/burial arrangements.  Since it is our office's fault that his appt tomorrow is being cancelled, I've sent in 1 inhaler of Anoro with no refills, and advised patient that we cannot refill this any further until he is seen by our office.  Pt expressed understanding.  Nothing further needed at this time- will close encounter.

## 2019-01-23 ENCOUNTER — Ambulatory Visit: Payer: Medicare Other | Admitting: Pulmonary Disease

## 2019-02-07 ENCOUNTER — Other Ambulatory Visit: Payer: Self-pay

## 2019-02-07 ENCOUNTER — Encounter: Payer: Self-pay | Admitting: Cardiology

## 2019-02-07 ENCOUNTER — Ambulatory Visit (INDEPENDENT_AMBULATORY_CARE_PROVIDER_SITE_OTHER): Payer: Medicare Other | Admitting: Cardiology

## 2019-02-07 VITALS — BP 114/70 | HR 92 | Ht 68.0 in | Wt 271.0 lb

## 2019-02-07 DIAGNOSIS — I4819 Other persistent atrial fibrillation: Secondary | ICD-10-CM

## 2019-02-07 DIAGNOSIS — I5022 Chronic systolic (congestive) heart failure: Secondary | ICD-10-CM | POA: Diagnosis not present

## 2019-02-07 NOTE — Patient Instructions (Addendum)
Medication Instructions:  The current medical regimen is effective;  continue present plan and medications.  *If you need a refill on your cardiac medications before your next appointment, please call your pharmacy*  Follow-Up: At [facility], you and your health needs are our priority.  As part of our continuing mission to provide you with exceptional heart care, we have created designated Provider Care Teams.  These Care Teams include your primary Cardiologist (physician) and Advanced Practice Providers (APPs -  Physician Assistants and Nurse Practitioners) who all work together to provide you with the care you need, when you need it.  Your next appointment:   6 month(s)  The format for your next appointment:   In Person  Provider:   [name], MD  Thank you for choosing [facility]!!    COVID-19 Vaccine Information can be found at: [email or web address] For questions related to vaccine distribution or appointments, please email [email or web address] or call [phone].

## 2019-02-07 NOTE — Progress Notes (Signed)
[facility] [address] [place], [place]  [address] Phone: [phone] Fax:  [phone]  Date:  02/07/2019   ID:  Norman Rodriguez, DOB May 18, 1955, MRN [id number]  PCP:  Loyola Mast, PA-C   History of Present Illness: Norman Rodriguez is a 64 y.o. 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   He failed Tikosyn, amiodarone , multiple cardioversions. We are now in rate control strategy.  Complaints have been fairly consistent over the last several visits, shortness of breath with activity.   No new complaints. He understands the importance of the coreg. He has had sexual dysfunction he states. No bleeding, syncope, orthopnea. No chest pain.   08/11/17 - heat is causes SOB. Likes AC.   Wt Readings from Last 3 Encounters:  02/07/19 271 lb (122.9 kg)  09/25/18 269 lb 12.8 oz (122.4 kg)  05/09/18 270 lb (122.5 kg)     Past Medical History:  Diagnosis Date  . Arthritis   . Atrial fibrillation ([place])    a. Dx 2010. b. Initiated on Tikosyn 03/2013.  [name] CAD (coronary artery disease)    a. BMS 2.78mm 23 to LAD. CTO of RCA unsuccessful, good left to right collaterals - 2010.  [name] Chronic anticoagulation   . Chronic systolic CHF (congestive heart failure) ([place])    a. Both ischemic and tachy induced. b. Echo 04/2013: EF 30-35%, no RWMA, normal RV, mod dilated LA (before restoration of NSR).  . COPD (chronic obstructive pulmonary disease) ([place]) 10/22/2017  . Dysrhythmia    hx of atrial fibrilation  . HTN (hypertension)   . Hyperlipidemia   . NSVT (nonsustained ventricular tachycardia) ([place])   . Obesity   . Shortness of breath     Past Surgical History:  Procedure Laterality Date  . CARDIOVERSION N/A 04/27/2013   Procedure: BEDSIDE CARDIOVERSION;  Surgeon: Sanda Klein, MD;  Location: [facility];  Service: Cardiovascular;  Laterality: N/A;  .  CARDIOVERSION N/A 05/15/2013   Procedure: CARDIOVERSION/BEDSIDE;  Surgeon: Larey Dresser, MD;  Location: [facility];  Service: Cardiovascular;  Laterality: N/A;  . CARDIOVERSION N/A 05/16/2014   Procedure: CARDIOVERSION;  Surgeon: Jerline Pain, MD;  Location: [facility];  Service: Cardiovascular;  Laterality: N/A;  . CARDIOVERSION N/A 06/07/2014   Procedure: CARDIOVERSION;  Surgeon: Jerline Pain, MD;  Location: [facility];  Service: Cardiovascular;  Laterality: N/A;  . COLONOSCOPY WITH PROPOFOL N/A 10/27/2017   Procedure: COLONOSCOPY WITH PROPOFOL;  Surgeon: Mauri Pole, MD;  Location: [facility];  Service: Endoscopy;  Laterality: N/A;  . CORONARY ANGIOPLASTY WITH STENT PLACEMENT  2010   BMS 2.98mm 23 to LAD. CTO of RCA unsuccessful, good left to right collaterals  . ESOPHAGOGASTRODUODENOSCOPY (EGD) WITH PROPOFOL N/A 10/24/2017   Procedure: ESOPHAGOGASTRODUODENOSCOPY (EGD) WITH PROPOFOL;  Surgeon: Mauri Pole, MD;  Location: [facility];  Service: Endoscopy;  Laterality: N/A;  . FOOT SURGERY Right   . POLYPECTOMY  10/27/2017   Procedure: POLYPECTOMY;  Surgeon: Mauri Pole, MD;  Location: [facility];  Service: Endoscopy;;    Current Outpatient Medications  Medication Sig Dispense Refill  . ANORO ELLIPTA 62.5-25 MCG/INH AEPB INHALE ONE PUFFS INTO THE LUNGS DAILY 60 each 3  . atorvastatin (LIPITOR) 40 MG tablet Pt takes 4-5 times monthly 30 tablet 9  . B Complex-C (B-COMPLEX WITH VITAMIN C) tablet Take 1 tablet by mouth daily.    [name]  carvedilol (COREG) 12.5 MG tablet TAKE ONE TABLET BY MOUTH TWICE A DAY WITH MEALS 180 tablet 2  . Coenzyme Q10 (CO Q-10) 200 MG CAPS Take 1 capsule by mouth daily.    [name] ENTRESTO 49-51 MG TAKE 1 TABLET BY MOUTH TWO (TWO) TIMES DAILY. 180 tablet 2  . fexofenadine (ALLEGRA) 180 MG tablet Take 180 mg by mouth daily.    . furosemide (LASIX) 40 MG tablet TAKE 1 TABLET (40 MG TOTAL) BY MOUTH DAILY. 90 tablet 2  . gabapentin (NEURONTIN) 300 MG  capsule Take 600 mg by mouth as needed (carpel tunnel).     . LevOCARNitine (CARNITINE PO) Take 500 mg by mouth daily.    . Magnesium 400 MG CAPS Take 1 capsule by mouth daily.     . meloxicam (MOBIC) 15 MG tablet Take 15 mg by mouth daily.    . Multiple Vitamin (MULTIVITAMIN) tablet Take 1 tablet by mouth daily.    . nitroGLYCERIN (NITROSTAT) 0.4 MG SL tablet Place 0.4 mg under the tongue every 5 (five) minutes as needed for chest pain.    . NON FORMULARY Take 1 capsule by mouth daily. (Ribose) Take 1 capsule once a day     . NON FORMULARY Take 1 tablet by mouth daily. Glycine vitamin 1 tab po qd    . NON FORMULARY Take 465 mg by mouth as needed (joint pain). [place]     . Omega-3 Fatty Acids (OMEGA 3 PO) Take 2 capsules by mouth daily.     . Red Yeast Rice 600 MG CAPS Take 2 capsules (1,200 mg total) by mouth 2 (two) times daily. 180 capsule 3  . Red Yeast Rice POWD by Does not apply route.    . umeclidinium-vilanterol (ANORO ELLIPTA) 62.5-25 MCG/INH AEPB Inhale 1 puff into the lungs daily. No further refills until OV. 60 each 0  . vitamin C (ASCORBIC ACID) 500 MG tablet Take 500 mg by mouth daily.    [name] VITAMIN D, CHOLECALCIFEROL, PO Take 2 tablets by mouth daily.    Alveda Reasons 20 MG TABS tablet TAKE 1 TABLET (20 MG TOTAL) BY MOUTH DAILY WITH SUPPER. 90 tablet 3   No current facility-administered medications for this visit.    Allergies:    Allergies  Allergen Reactions  . Apple Hives  . Soybean Oil Other (See Comments)    unknown  . Lisinopril Cough  . Other Itching    Pt has itching and joint swelling with walnuts, mildew, fungi, basil and peaches  . Bean Pod Extract Rash    Green Beans specifically Joint swelling and itching  . Carrot [Daucus Carota] Rash    Joint swelling and itching  . Grass Extracts [Gramineae Pollens] Rash    Joint swelling and itching  . Peach Flavor Rash    Joint swelling and itching  . Tree Extract Rash    Joint swelling and itching     Social History:  The patient  reports that he has quit smoking. His smoking use included cigars. He has never used smokeless tobacco. He reports that he does not drink alcohol or use drugs.   Family History  Problem Relation Age of Onset  . Other Father        Car accident  . Coronary artery disease Neg Hx     ROS:  Please see the history of present illness.    PHYSICAL EXAM: VS:  BP 114/70   Pulse 92   Ht 5\' 8"  (1.727 m)  Wt 271 lb (122.9 kg)   SpO2 94%   BMI 41.21 kg/m  GEN: Well nourished, well developed, in no acute distress, obese HEENT: normal  Neck: no JVD, carotid bruits, or masses Cardiac: RRR; no murmurs, rubs, or gallops,no edema  Respiratory:  clear to auscultation bilaterally, normal work of breathing GI: soft, nontender, nondistended, + BS MS: no deformity or atrophy  Skin: warm and dry, no rash Neuro:  Alert and Oriented x 3, Strength and sensation are intact Psych: euthymic mood, full affect   ECHO 2019: - Left ventricle: The cavity size was mildly dilated. Wall   thickness was increased in a pattern of mild LVH. Systolic   function was moderately to severely reduced. The estimated   ejection fraction was in the range of 30% to 35%. There is   akinesis of the inferolateral and inferior myocardium. There is   hypokinesis of the anteroseptal and inferoseptal myocardium.  Impressions:  - Technically difficult; definity used; akinesis of the inferior   and inferolateral walls with overall moderate to severe LV   dysfunction; mild LVH and LVE.   EKG: EKG 09/25/2018-atrial fibrillation 92 bpm nonspecific T wave changes-06/02/16 shows atrial fibrillation heart rate 107, nonspecific T-wave changes. Personally viewed 07/10/15 shows atrial fibrillation heart rate 94 with PVC, nonspecific ST-T wave changes personally viewed-prior 05/27/14-atrial fibrillation heart rate 88.  ASSESSMENT AND PLAN:  1. Permanent atrial fibrillation- Dr. Tanna Furry has evaluated him  in the past. We will continue with rate control strategy. Continue with carvedilol 12.5 BID. Rate is under control. Expressed the importance of this medication.  Overall doing well with rate control currently.  Carvedilol only.   2. Cardiomyopathy-both tachycardia as well as ischemic-continue to encourage carvedilol 12.5 twice a day.   Doing better with Entresto, increased dose.  No changes made. 3. Chronic systolic heart failure-ejection fraction 35%.   Entresto 48/52mg . Previously on losartan 50 QD. We previously stopped digoxin.  Feels well without any new symptoms.  NYHA class I-II  4. Hyperlipidemia -ultimately would like for him to be on statin therapy however he has chosen to go on red yeast rice extract.  Ultimate LDL goal would be great to be at 70. 5. chronic anticoagulation-as above.  On Xarelto.  In 2019 had bleeding ulcer.  Back on Xarelto.  Doing well.   6. Dyspnea-appreciate pulmonary consultation. 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 Long history of smoking. Quit 10 years ago.  No changes 7. Coronary artery disease-prior cardiac catheterization reviewed. Cardiomyopathy seems to be out of proportion to CAD.  8. Morbid Obesity-continue to encourage weight loss. No changes. This is in part the crux of his issues.   6 months with me  Signed, [name], MD [facility]  02/07/2019 3:35 PM

## 2019-02-22 ENCOUNTER — Telehealth: Payer: Self-pay | Admitting: Cardiology

## 2019-02-22 NOTE — Telephone Encounter (Signed)
Patient called in stating that he received a letter from our office - he ended up losing the letter without opening it and is calling to see what the letter could have been about.

## 2019-02-22 NOTE — Telephone Encounter (Signed)
LMTCB regarding letter in the mail

## 2019-02-23 NOTE — Telephone Encounter (Signed)
I spoke to the patient and mentioned to him that I cannot locate a letter from our office that he should have received.  He verbalized understanding.

## 2019-03-21 ENCOUNTER — Other Ambulatory Visit: Payer: Self-pay | Admitting: Cardiology

## 2019-03-23 ENCOUNTER — Telehealth: Payer: Self-pay | Admitting: Cardiology

## 2019-03-23 NOTE — Telephone Encounter (Signed)
Left message for pt OK to take Vit A with Xarelto per pharmacist.  Requested he c/b if any other questions or concerns.

## 2019-03-23 NOTE — Telephone Encounter (Signed)
Vitamin A should be safe to take with Xarelto

## 2019-03-23 NOTE — Telephone Encounter (Signed)
° °  Pt c/o medication issue:  1. Name of Medication: [place], vitamin A - 2400 mcg per gel capsule  2. How are you currently taking this medication (dosage and times per day)?   3. Are you having a reaction (difficulty breathing--STAT)?   4. What is your medication issue? Pt is wondering if he can take this vitamin and if would not contra indicate with his blood thinner medication.

## 2019-04-13 ENCOUNTER — Telehealth: Payer: Self-pay | Admitting: Cardiology

## 2019-04-13 NOTE — Telephone Encounter (Signed)
Carvedilol, entresto, furosemide can all effect BP

## 2019-04-13 NOTE — Telephone Encounter (Signed)
New Message:     Pt wants to know which of his medicine have a blood pressure medicine in it also?

## 2019-04-13 NOTE — Telephone Encounter (Signed)
Spoke with patient who was questioning which of his medications effect BP because his mother has been seeing a provider who she believes prescribed a BP medication that is causing her to have loose stools.  Advised of Mr Marciel's medications however instructed him since his mother is not my pt and I don't know her medications, I am not able to advised her on which medication maybe causing the problem.  He states understanding.  He reports he just wanted to see if any of the medications he is taking is the same as his mother's (which he reports they are not).  Pt states she will f/u with her doctors.  In the meanwhile he is going to ask her to look into getting a probiotic at [facility] where she shops.  He will c/b with any further questions regarding his medical care.

## 2019-05-01 ENCOUNTER — Other Ambulatory Visit: Payer: Self-pay | Admitting: Cardiology

## 2019-05-01 NOTE — Telephone Encounter (Signed)
Xarelto 20mg  refill request received. Pt is 65 years old, weight-122.9kg, Crea-0.94 on 09/25/2018, last seen by Dr. Marlou Porch on 02/07/2019, Diagnosis-Afib, CrCl-134.65ml/min; Dose is appropriate based on dosing criteria. Will send in refill to requested pharmacy.

## 2019-07-26 ENCOUNTER — Telehealth: Payer: Self-pay | Admitting: Cardiology

## 2019-07-26 MED ORDER — ATORVASTATIN CALCIUM 40 MG PO TABS: ORAL_TABLET | ORAL | 0 refills | Status: DC

## 2019-07-26 NOTE — Addendum Note (Signed)
**Note De-Identified Erhard Senske Obfuscation** Addended by: Dennie Fetters on: 07/26/2019 03:00 PM   Modules accepted: Orders

## 2019-07-26 NOTE — Telephone Encounter (Signed)
New message    *STAT* If patient is at the pharmacy, call can be transferred to refill team.   1. Which medications need to be refilled? (please list name of each medication and dose if known) atorvastatin (LIPITOR) 40 MG tablet  2. Which pharmacy/location (including street and city if local pharmacy) is medication to be sent to?[place], [place]  3. Do they need a 30 day or 90 day supply? [place]

## 2019-07-26 NOTE — Telephone Encounter (Signed)
**Note De-Identified Onnie Hatchel Obfuscation** Atorvastatin refill e-scribed as requested. I did call pt but got no answer so I left him a detailed message on his VM advising him that I did send his Atorvastatin refill in for a 90 day supply. I also left the office phone number in the message so he can all back if he has any questions.

## 2019-08-03 ENCOUNTER — Telehealth: Payer: Self-pay | Admitting: Cardiology

## 2019-08-03 NOTE — Telephone Encounter (Signed)
Patient is calling to say that when he last requested a refill on atorvastatin (LIPITOR) 40 MG tablet, he only received 15 pills in his pill bottle. Patient said [facility] did not give any explanation as to why they only gave 15 pills. Patient said due to no transportation, he needs a pharmacy that includes free delivery. Patient asked for recommendations on free delivery.

## 2019-08-03 NOTE — Telephone Encounter (Signed)
Spoke with patient who is looking into pharmacies that deliver.  He reports having trouble with obtaining deliveries from [facility].  Advised pt to try [facility] (he doesn't like them) [facility] (he doesn't like them) He will try [facility] and [facility] that are close to his home.  He reports getting his RX straightened out already.

## 2019-08-08 ENCOUNTER — Telehealth: Payer: Self-pay | Admitting: Cardiology

## 2019-08-08 NOTE — Telephone Encounter (Signed)
Will forward to our pharm D.

## 2019-08-08 NOTE — Telephone Encounter (Signed)
Would confirm that loperamide is the active ingredient in the product he bought - this is ok to take. Does have a slight risk of QTc prolongation but he's not on any antiarrhythmics and most recent QTc was normal.

## 2019-08-08 NOTE — Telephone Encounter (Signed)
New message  Pt c/o medication issue:  1. Name of Medication: Antidiarrheal HCI Tablet  2. How are you currently taking this medication (dosage and times per day)? Not taken yet  3. Are you having a reaction (difficulty breathing--STAT)? No  4. What is your medication issue? Patient is calling in to see if he is able to take this medication with his AFIB. Please give patient a call back to advise.

## 2019-08-08 NOTE — Telephone Encounter (Signed)
Left message for patient to call back  

## 2019-08-09 ENCOUNTER — Telehealth: Payer: Self-pay | Admitting: Cardiology

## 2019-08-09 NOTE — Telephone Encounter (Signed)
Patient called back. Informed him of PharmD recommendations. Patient stated the active ingredient is loperamide. Patient had other questions about diverticulosis, encouraged patient to call his PCP for further questions.

## 2019-08-09 NOTE — Telephone Encounter (Signed)
See phone note on 08/09/19

## 2019-08-09 NOTE — Telephone Encounter (Signed)
New message   Pt c/o medication issue:  1. Name of Medication: diareal   2. How are you currently taking this medication (dosage and times per day)? n/a  3. Are you having a reaction (difficulty breathing--STAT)? no  4. What is your medication issue? Patient wants to know if he can take this medication with his heart medications. Please call.

## 2019-08-09 NOTE — Telephone Encounter (Signed)
Left message for patient to call back.    Per [employer] D, Would confirm that loperamide is the active ingredient in the product he bought - this is ok to take. Does have a slight risk of QTc prolongation but he's not on any antiarrhythmics and most recent QTc was normal.

## 2019-08-21 ENCOUNTER — Other Ambulatory Visit: Payer: Self-pay | Admitting: Cardiology

## 2019-08-23 ENCOUNTER — Other Ambulatory Visit: Payer: Self-pay

## 2019-08-23 ENCOUNTER — Ambulatory Visit (INDEPENDENT_AMBULATORY_CARE_PROVIDER_SITE_OTHER): Payer: Medicare Other | Admitting: Cardiology

## 2019-08-23 ENCOUNTER — Encounter: Payer: Self-pay | Admitting: Cardiology

## 2019-08-23 VITALS — BP 100/70 | HR 107 | Ht 68.0 in | Wt 258.0 lb

## 2019-08-23 DIAGNOSIS — I5022 Chronic systolic (congestive) heart failure: Secondary | ICD-10-CM

## 2019-08-23 DIAGNOSIS — E7849 Other hyperlipidemia: Secondary | ICD-10-CM

## 2019-08-23 DIAGNOSIS — I4819 Other persistent atrial fibrillation: Secondary | ICD-10-CM | POA: Diagnosis not present

## 2019-08-23 MED ORDER — NITROGLYCERIN 0.4 MG SL SUBL: 0.4000 mg | SUBLINGUAL_TABLET | SUBLINGUAL | 6 refills | Status: DC | PRN

## 2019-08-23 MED ORDER — DIGOXIN 125 MCG PO TABS: 0.1250 mg | ORAL_TABLET | Freq: Every day | ORAL | 3 refills | Status: DC

## 2019-08-23 NOTE — Progress Notes (Signed)
Cardiology Office Note:    Date:  08/23/2019   ID:  Norman Rodriguez, DOB January 14, 1956, MRN [id number]  PCP:  Annye English  [facility] Cardiologist:  [name], MD  [facility] Electrophysiologist:  Cristopher Peru, MD   Referring MD: Loyola Mast, PA-C     History of Present Illness:    Norman Rodriguez is a 64 y.o. 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   He failed Tikosyn, amiodarone , multiple cardioversions. We are now in rate control strategy.  Complaints have been fairly consistent over the last several visits, shortness of breath with activity.   No new complaints. He understands the importance of the coreg. He has had sexual dysfunction he states. No bleeding, syncope, orthopnea. No chest pain.   Overall, his heart rate is slightly elevated.  We will start him back on his digoxin.  We will also refill his nitroglycerin  He is having some left hip pain hard to lay on the left side.  Questionable bursitis.  No evidence on physical exam of DVT.    Past Medical History:  Diagnosis Date  . Arthritis   . Atrial fibrillation ([place])    a. Dx 2010. b. Initiated on Tikosyn 03/2013.  [name] CAD (coronary artery disease)    a. BMS 2.9mm 23 to LAD. CTO of RCA unsuccessful, good left to right collaterals - 2010.  [name] Chronic anticoagulation   . Chronic systolic CHF (congestive heart failure) ([place])    a. Both ischemic and tachy induced. b. Echo 04/2013: EF 30-35%, no RWMA, normal RV, mod dilated LA (before restoration of NSR).  . COPD (chronic obstructive pulmonary disease) ([place]) 10/22/2017  . Dysrhythmia    hx of atrial fibrilation  . HTN (hypertension)   . Hyperlipidemia   . NSVT (nonsustained ventricular tachycardia) ([place])   . Obesity   . Shortness of breath     Past Surgical History:  Procedure Laterality Date  .  CARDIOVERSION N/A 04/27/2013   Procedure: BEDSIDE CARDIOVERSION;  Surgeon: Sanda Klein, MD;  Location: [place];  Service: Cardiovascular;  Laterality: N/A;  . CARDIOVERSION N/A 05/15/2013   Procedure: CARDIOVERSION/BEDSIDE;  Surgeon: Larey Dresser, MD;  Location: [place];  Service: Cardiovascular;  Laterality: N/A;  . CARDIOVERSION N/A 05/16/2014   Procedure: CARDIOVERSION;  Surgeon: Jerline Pain, MD;  Location: [place];  Service: Cardiovascular;  Laterality: N/A;  . CARDIOVERSION N/A 06/07/2014   Procedure: CARDIOVERSION;  Surgeon: Jerline Pain, MD;  Location: [place];  Service: Cardiovascular;  Laterality: N/A;  . COLONOSCOPY WITH PROPOFOL N/A 10/27/2017   Procedure: COLONOSCOPY WITH PROPOFOL;  Surgeon: Mauri Pole, MD;  Location: [facility];  Service: Endoscopy;  Laterality: N/A;  . CORONARY ANGIOPLASTY WITH STENT PLACEMENT  2010   BMS 2.26mm 23 to LAD. CTO of RCA unsuccessful, good left to right collaterals  . ESOPHAGOGASTRODUODENOSCOPY (EGD) WITH PROPOFOL N/A 10/24/2017   Procedure: ESOPHAGOGASTRODUODENOSCOPY (EGD) WITH PROPOFOL;  Surgeon: Mauri Pole, MD;  Location: [facility];  Service: Endoscopy;  Laterality: N/A;  . FOOT SURGERY Right   . POLYPECTOMY  10/27/2017   Procedure: POLYPECTOMY;  Surgeon: Mauri Pole, MD;  Location: [facility];  Service: Endoscopy;;    Current Medications: Current Meds  Medication Sig  . ANORO ELLIPTA 62.5-25 MCG/INH AEPB INHALE ONE PUFFS INTO THE LUNGS DAILY  . atorvastatin (LIPITOR) 40 MG tablet Pt takes 4-5 times monthly  .  B Complex-C (B-COMPLEX WITH VITAMIN C) tablet Take 1 tablet by mouth daily.  . carvedilol (COREG) 12.5 MG tablet TAKE ONE TABLET BY MOUTH TWICE A DAY WITH MEALS  . Coenzyme Q10 (CO Q-10) 200 MG CAPS Take 1 capsule by mouth daily.  [name] ENTRESTO 49-51 MG TAKE ONE TABLET BY MOUTH TWICE A DAY  . fexofenadine (ALLEGRA) 180 MG tablet Take 180 mg by mouth daily.  . furosemide (LASIX) 40 MG tablet TAKE ONE  TABLET BY MOUTH DAILY  . gabapentin (NEURONTIN) 300 MG capsule Take 600 mg by mouth as needed (carpel tunnel).   . LevOCARNitine (CARNITINE PO) Take 500 mg by mouth daily.  . Magnesium 400 MG CAPS Take 1 capsule by mouth daily.   . meloxicam (MOBIC) 15 MG tablet Take 15 mg by mouth daily.  . Multiple Vitamin (MULTIVITAMIN) tablet Take 1 tablet by mouth daily.  . nitroGLYCERIN (NITROSTAT) 0.4 MG SL tablet Place 1 tablet (0.4 mg total) under the tongue every 5 (five) minutes as needed for chest pain.  . NON FORMULARY Take 1 capsule by mouth daily. (Ribose) Take 1 capsule once a day   . NON FORMULARY Take 1 tablet by mouth daily. Glycine vitamin 1 tab po qd  . NON FORMULARY Take 465 mg by mouth as needed (joint pain). [place]   . Omega-3 Fatty Acids (OMEGA 3 PO) Take 2 capsules by mouth daily.   . Red Yeast Rice 600 MG CAPS Take 2 capsules (1,200 mg total) by mouth 2 (two) times daily.  . Red Yeast Rice POWD by Does not apply route.  . umeclidinium-vilanterol (ANORO ELLIPTA) 62.5-25 MCG/INH AEPB Inhale 1 puff into the lungs daily. No further refills until OV.  . vitamin C (ASCORBIC ACID) 500 MG tablet Take 500 mg by mouth daily.  [name] VITAMIN D, CHOLECALCIFEROL, PO Take 2 tablets by mouth daily.  Alveda Reasons 20 MG TABS tablet TAKE 1 TABLET (20 MG TOTAL) BY MOUTH DAILY WITH SUPPER.  . [DISCONTINUED] nitroGLYCERIN (NITROSTAT) 0.4 MG SL tablet Place 0.4 mg under the tongue every 5 (five) minutes as needed for chest pain.     Allergies:   Apple, Soybean oil, Lisinopril, Other, Bean pod extract, Carrot [daucus carota], Grass extracts [gramineae pollens], Peach flavor, and Tree extract   Social History   Socioeconomic History  . Marital status: Single    Spouse name: Not on file  . Number of children: Not on file  . Years of education: Not on file  . Highest education level: Not on file  Occupational History  . Not on file  Tobacco Use  . Smoking status: Former Smoker    Types:  Cigars  . Smokeless tobacco: Never Used  . Tobacco comment: 1 cigar per month states he stopped smoking  Vaping Use  . Vaping Use: Never used  Substance and Sexual Activity  . Alcohol use: No  . Drug use: No    Comment: 1 per month...pt states he stopped smoking  . Sexual activity: Not on file  Other Topics Concern  . Not on file  Social History Narrative  . Not on file   Social Determinants of Health   Financial Resource Strain:   . Difficulty of Paying Living Expenses:   Food Insecurity:   . Worried About Charity fundraiser in the Last Year:   . Arboriculturist in the Last Year:   Transportation Needs:   . Film/video editor (Medical):   [name] Lack of Transportation (  Non-Medical):   Physical Activity:   . Days of Exercise per Week:   . Minutes of Exercise per Session:   Stress:   . Feeling of Stress :   Social Connections:   . Frequency of Communication with Friends and Family:   . Frequency of Social Gatherings with Friends and Family:   . Attends Religious Services:   . Active Member of Clubs or Organizations:   . Attends Archivist Meetings:   [name] Marital Status:      Family History: The patient's family history includes Other in his father. There is no history of Coronary artery disease.  ROS:   Please see the history of present illness.     All other systems reviewed and are negative.  EKGs/Labs/Other Studies Reviewed:    The following studies were reviewed today:  ECHO 2019: - Left ventricle: The cavity size was mildly dilated. Wall thickness was increased in a pattern of mild LVH. Systolic function was moderately to severely reduced. The estimated ejection fraction was in the range of 30% to 35%. There is akinesis of the inferolateral and inferior myocardium. There is hypokinesis of the anteroseptal and inferoseptal myocardium.  Impressions:  - Technically difficult; definity used; akinesis of the inferior and inferolateral  walls with overall moderate to severe LV dysfunction; mild LVH and LVE.   EKG:  EKG is  ordered today.  The ekg ordered today demonstrates atrial fibrillation 107 bpm nonspecific T wave changes- 09/25/2018-atrial fibrillation 92 bpm nonspecific T wave changes-06/02/16 shows atrial fibrillation heart rate 107  Recent Labs: 09/25/2018: ALT 39; BUN 11; Creatinine, Ser 0.94; Hemoglobin 17.5; Platelets 224; Potassium 5.3; Sodium 139  Recent Lipid Panel    Component Value Date/Time   CHOL 230 (H) 09/25/2018 1516   TRIG 152 (H) 09/25/2018 1516   HDL 57 09/25/2018 1516   CHOLHDL 4.0 09/25/2018 1516   CHOLHDL 4.3 05/15/2013 0118   VLDL 41 (H) 05/15/2013 0118   LDLCALC 146 (H) 09/25/2018 1516    Physical Exam:    VS:  BP 100/70   Pulse (!) 107   Ht 5\' 8"  (1.727 m)   Wt (!) 258 lb (117 kg)   SpO2 96%   BMI 39.23 kg/m     Wt Readings from Last 3 Encounters:  08/23/19 (!) 258 lb (117 kg)  02/07/19 271 lb (122.9 kg)  09/25/18 269 lb 12.8 oz (122.4 kg)     GEN:  Well nourished, well developed in no acute distress HEENT: Normal NECK: No JVD; No carotid bruits LYMPHATICS: No lymphadenopathy CARDIAC: Irreg irreg, no murmurs, rubs, gallops RESPIRATORY:  Clear to auscultation without rales, wheezing or rhonchi  ABDOMEN: Soft, non-tender, non-distended MUSCULOSKELETAL:  No edema; No deformity  SKIN: Warm and dry NEUROLOGIC:  Alert and oriented x 3 PSYCHIATRIC:  Normal affect   ASSESSMENT:    1. Persistent atrial fibrillation ([place])   2. Chronic systolic heart failure ([facility])   3. Other hyperlipidemia    PLAN:    In order of problems listed above:  Permanent atrial fibrillation -Previously Dr. Lovena Le has evaluated him, continuing with rate control strategy.  Has failed Tikosyn and other medications.  Continue with carvedilol 12.5 mg twice a day.  Rate is under reasonable control. -It has been a few years since he has been on digoxin, 2017.  I will go ahead and restart 125 mcg of  digoxin to see if we can improve his overall rate control slightly.  Dilated cardiomyopathy/chronic systolic heart failure -EF 35%.  Continuing with Entresto.  NYHA class I-II.   Hyperlipidemia -He has been utilizing red yeast rice extract.  Did not wish to be on statin therapy.  Given his coronary artery disease, would like for him to be on statin.  Chronic anticoagulation -For atrial fibrillation.  He has been on Xarelto.  In 2019 he did have a bleeding ulcer.  This was treated and he is now back on Xarelto and doing well.  Ongoing dyspnea on exertion -Has seen pulmonary in the past.  Has a degree of restrictive lung disease from diaphragmatic hernia repair as a child as well as obesity.  Long prior history of smoking.  He quit about 10 to 15 years ago.  Coronary artery disease -His prior heart catheterization in 2010 revealed 80% mid LAD lesion which was successfully stented by Dr. Irish Lack and an occluded right coronary artery with good collateral flow which was opened by PTCA and marginally improved overall flow.  His cardiomyopathy was out of proportion to CAD.   Medication Adjustments/Labs and Tests Ordered: Current medicines are reviewed at length with the patient today.  Concerns regarding medicines are outlined above.  Orders Placed This Encounter  Procedures  . EKG 12-Lead   Meds ordered this encounter  Medications  . nitroGLYCERIN (NITROSTAT) 0.4 MG SL tablet    Sig: Place 1 tablet (0.4 mg total) under the tongue every 5 (five) minutes as needed for chest pain.    Dispense:  25 tablet    Refill:  6  . digoxin (LANOXIN) 0.125 MG tablet    Sig: Take 1 tablet (0.125 mg total) by mouth daily.    Dispense:  90 tablet    Refill:  3    Patient Instructions  Medication Instructions:  Please start Digoxin 0.125 mg daily. Continue all other medications as listed.  *If you need a refill on your cardiac medications before your next appointment, please call your  pharmacy*  Follow-Up: At [facility], you and your health needs are our priority.  As part of our continuing mission to provide you with exceptional heart care, we have created designated Provider Care Teams.  These Care Teams include your primary Cardiologist (physician) and Advanced Practice Providers (APPs -  Physician Assistants and Nurse Practitioners) who all work together to provide you with the care you need, when you need it.  We recommend signing up for the patient portal called "MyChart".  Sign up information is provided on this After Visit Summary.  MyChart is used to connect with patients for Virtual Visits (Telemedicine).  Patients are able to view lab/test results, encounter notes, upcoming appointments, etc.  Non-urgent messages can be sent to your provider as well.   To learn more about what you can do with MyChart, go to [email or web address].    Your next appointment:   4 month(s)  The format for your next appointment:   In Person  Provider:   [name], MD   Thank you for choosing [facility]!!        Signed, [name], MD  08/23/2019 4:39 PM    Norman Rodriguez

## 2019-08-23 NOTE — Patient Instructions (Signed)
Medication Instructions:  Please start Digoxin 0.125 mg daily. Continue all other medications as listed.  *If you need a refill on your cardiac medications before your next appointment, please call your pharmacy*  Follow-Up: At [facility], you and your health needs are our priority.  As part of our continuing mission to provide you with exceptional heart care, we have created designated Provider Care Teams.  These Care Teams include your primary Cardiologist (physician) and Advanced Practice Providers (APPs -  Physician Assistants and Nurse Practitioners) who all work together to provide you with the care you need, when you need it.  We recommend signing up for the patient portal called "MyChart".  Sign up information is provided on this After Visit Summary.  MyChart is used to connect with patients for Virtual Visits (Telemedicine).  Patients are able to view lab/test results, encounter notes, upcoming appointments, etc.  Non-urgent messages can be sent to your provider as well.   To learn more about what you can do with MyChart, go to [email or web address].    Your next appointment:   4 month(s)  The format for your next appointment:   In Person  Provider:   [name], MD   Thank you for choosing [facility]!!

## 2019-11-19 ENCOUNTER — Other Ambulatory Visit: Payer: Self-pay | Admitting: Cardiology

## 2019-11-19 NOTE — Telephone Encounter (Signed)
Age 64, weight 117kg, SCr 0.9 in Care Everywhere on 06/26/19, CrCl > 100 afib indication, last visit July 2021

## 2019-12-25 ENCOUNTER — Other Ambulatory Visit: Payer: Self-pay

## 2019-12-25 ENCOUNTER — Encounter: Payer: Self-pay | Admitting: Cardiology

## 2019-12-25 ENCOUNTER — Ambulatory Visit (INDEPENDENT_AMBULATORY_CARE_PROVIDER_SITE_OTHER): Payer: Medicare Other | Admitting: Cardiology

## 2019-12-25 VITALS — BP 106/76 | HR 76 | Ht 68.5 in | Wt 257.6 lb

## 2019-12-25 DIAGNOSIS — I4819 Other persistent atrial fibrillation: Secondary | ICD-10-CM | POA: Diagnosis not present

## 2019-12-25 DIAGNOSIS — I5022 Chronic systolic (congestive) heart failure: Secondary | ICD-10-CM | POA: Diagnosis not present

## 2019-12-25 NOTE — Progress Notes (Signed)
Cardiology Office Note:    Date:  12/25/2019   ID:  Norman Rodriguez, DOB 08/21/1955, MRN [id number]  PCP:  Annye English  [facility] Cardiologist:  [name], MD  [facility] Electrophysiologist:  Cristopher Peru, MD   Referring MD: Loyola Mast, PA-C     History of Present Illness:    Norman Rodriguez is a 64 y.o. 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  In the past, has failed Tikosyn amiodarone and multiple cardioversions.  Rate control strategy.  Rate has been slightly elevated in the past therefore at a prior visit we started him back on his digoxin.  He has had shortness of breath with activity over prior several visits.  NYHA class II.  Ambulated into the office today without difficulty.  Past Medical History:  Diagnosis Date  . Arthritis   . Atrial fibrillation ([place])    a. Dx 2010. b. Initiated on Tikosyn 03/2013.  [name] CAD (coronary artery disease)    a. BMS 2.27mm 23 to LAD. CTO of RCA unsuccessful, good left to right collaterals - 2010.  [name] Chronic anticoagulation   . Chronic systolic CHF (congestive heart failure) ([place])    a. Both ischemic and tachy induced. b. Echo 04/2013: EF 30-35%, no RWMA, normal RV, mod dilated LA (before restoration of NSR).  . COPD (chronic obstructive pulmonary disease) ([place]) 10/22/2017  . Dysrhythmia    hx of atrial fibrilation  . HTN (hypertension)   . Hyperlipidemia   . NSVT (nonsustained ventricular tachycardia) ([place])   . Obesity   . Shortness of breath     Past Surgical History:  Procedure Laterality Date  . CARDIOVERSION N/A 04/27/2013   Procedure: BEDSIDE CARDIOVERSION;  Surgeon: Sanda Klein, MD;  Location: [place];  Service: Cardiovascular;  Laterality: N/A;  . CARDIOVERSION N/A 05/15/2013   Procedure: CARDIOVERSION/BEDSIDE;  Surgeon: Larey Dresser, MD;  Location:  [place];  Service: Cardiovascular;  Laterality: N/A;  . CARDIOVERSION N/A 05/16/2014   Procedure: CARDIOVERSION;  Surgeon: Jerline Pain, MD;  Location: [place];  Service: Cardiovascular;  Laterality: N/A;  . CARDIOVERSION N/A 06/07/2014   Procedure: CARDIOVERSION;  Surgeon: Jerline Pain, MD;  Location: [place];  Service: Cardiovascular;  Laterality: N/A;  . COLONOSCOPY WITH PROPOFOL N/A 10/27/2017   Procedure: COLONOSCOPY WITH PROPOFOL;  Surgeon: Mauri Pole, MD;  Location: [facility];  Service: Endoscopy;  Laterality: N/A;  . CORONARY ANGIOPLASTY WITH STENT PLACEMENT  2010   BMS 2.54mm 23 to LAD. CTO of RCA unsuccessful, good left to right collaterals  . ESOPHAGOGASTRODUODENOSCOPY (EGD) WITH PROPOFOL N/A 10/24/2017   Procedure: ESOPHAGOGASTRODUODENOSCOPY (EGD) WITH PROPOFOL;  Surgeon: Mauri Pole, MD;  Location: [facility];  Service: Endoscopy;  Laterality: N/A;  . FOOT SURGERY Right   . POLYPECTOMY  10/27/2017   Procedure: POLYPECTOMY;  Surgeon: Mauri Pole, MD;  Location: [facility];  Service: Endoscopy;;    Current Medications: Current Meds  Medication Sig  . ANORO ELLIPTA 62.5-25 MCG/INH AEPB INHALE ONE PUFFS INTO THE LUNGS DAILY  . atorvastatin (LIPITOR) 40 MG tablet Pt takes 4-5 times monthly  . B Complex-C (B-COMPLEX WITH VITAMIN C) tablet Take 1 tablet by mouth daily.  . carvedilol (COREG) 12.5 MG tablet TAKE ONE TABLET BY MOUTH TWICE A DAY WITH MEALS  . Coenzyme Q10 (CO Q-10) 200 MG CAPS Take 1 capsule by mouth daily.  . digoxin (  LANOXIN) 0.125 MG tablet Take 1 tablet (0.125 mg total) by mouth daily.  [name] ENTRESTO 49-51 MG TAKE ONE TABLET BY MOUTH TWICE A DAY  . fexofenadine (ALLEGRA) 180 MG tablet Take 180 mg by mouth daily.  . furosemide (LASIX) 40 MG tablet TAKE ONE TABLET BY MOUTH DAILY  . gabapentin (NEURONTIN) 300 MG capsule Take 600 mg by mouth as needed (carpel tunnel).   . LevOCARNitine (CARNITINE PO) Take 500 mg by mouth daily.  .  Magnesium 400 MG CAPS Take 1 capsule by mouth daily.   . meloxicam (MOBIC) 15 MG tablet Take 15 mg by mouth daily.  . Multiple Vitamin (MULTIVITAMIN) tablet Take 1 tablet by mouth daily.  . nitroGLYCERIN (NITROSTAT) 0.4 MG SL tablet Place 1 tablet (0.4 mg total) under the tongue every 5 (five) minutes as needed for chest pain.  . NON FORMULARY Take 1 tablet by mouth daily. Glycine vitamin 1 tab po qd  . NON FORMULARY Take 465 mg by mouth as needed (joint pain). [place]   . Omega-3 Fatty Acids (OMEGA 3 PO) Take 2 capsules by mouth daily.   . Red Yeast Rice 600 MG CAPS Take 2 capsules (1,200 mg total) by mouth 2 (two) times daily.  . Red Yeast Rice POWD by Does not apply route.  . vitamin C (ASCORBIC ACID) 500 MG tablet Take 500 mg by mouth daily.  [name] VITAMIN D, CHOLECALCIFEROL, PO Take 2 tablets by mouth daily.  Alveda Reasons 20 MG TABS tablet TAKE ONE TABLET BY MOUTH DAILY WITH SUPPER     Allergies:   Apple, Soybean oil, Lisinopril, Other, Bean pod extract, Carrot [daucus carota], Grass extracts [gramineae pollens], Peach flavor, and Tree extract   Social History   Socioeconomic History  . Marital status: Single    Spouse name: Not on file  . Number of children: Not on file  . Years of education: Not on file  . Highest education level: Not on file  Occupational History  . Not on file  Tobacco Use  . Smoking status: Former Smoker    Types: Cigars  . Smokeless tobacco: Never Used  . Tobacco comment: 1 cigar per month states he stopped smoking  Vaping Use  . Vaping Use: Never used  Substance and Sexual Activity  . Alcohol use: No  . Drug use: No    Comment: 1 per month...pt states he stopped smoking  . Sexual activity: Not on file  Other Topics Concern  . Not on file  Social History Narrative  . Not on file   Social Determinants of Health   Financial Resource Strain:   . Difficulty of Paying Living Expenses: Not on file  Food Insecurity:   . Worried About  Charity fundraiser in the Last Year: Not on file  . Ran Out of Food in the Last Year: Not on file  Transportation Needs:   . Lack of Transportation (Medical): Not on file  . Lack of Transportation (Non-Medical): Not on file  Physical Activity:   . Days of Exercise per Week: Not on file  . Minutes of Exercise per Session: Not on file  Stress:   . Feeling of Stress : Not on file  Social Connections:   . Frequency of Communication with Friends and Family: Not on file  . Frequency of Social Gatherings with Friends and Family: Not on file  . Attends Religious Services: Not on file  . Active Member of Clubs or Organizations: Not on file  .  Attends Archivist Meetings: Not on file  . Marital Status: Not on file     Family History: The patient's family history includes Other in his father. There is no history of Coronary artery disease.  ROS:   Please see the history of present illness.     All other systems reviewed and are negative.  EKGs/Labs/Other Studies Reviewed:    The following studies were reviewed today:  ECHO 2019: - Left ventricle: The cavity size was mildly dilated. Wall thickness was increased in a pattern of mild LVH. Systolic function was moderately to severely reduced. The estimated ejection fraction was in the range of 30% to 35%. There is akinesis of the inferolateral and inferior myocardium. There is hypokinesis of the anteroseptal and inferoseptal myocardium.  Impressions:  - Technically difficult; definity used; akinesis of the inferior and inferolateral walls with overall moderate to severe LV dysfunction; mild LVH and LVE  EKG:  09/25/2018-atrial fibrillation 92 bpm nonspecific T wave changes-06/02/16 shows atrial fibrillation heart rate 107  Recent Labs: No results found for requested labs within last 8760 hours.  Recent Lipid Panel    Component Value Date/Time   CHOL 230 (H) 09/25/2018 1516   TRIG 152 (H) 09/25/2018 1516    HDL 57 09/25/2018 1516   CHOLHDL 4.0 09/25/2018 1516   CHOLHDL 4.3 05/15/2013 0118   VLDL 41 (H) 05/15/2013 0118   LDLCALC 146 (H) 09/25/2018 1516     Risk Assessment/Calculations:       Physical Exam:    VS:  BP 106/76   Pulse 76   Ht 5' 8.5" (1.74 m)   Wt 257 lb 9.6 oz (116.8 kg)   SpO2 96%   BMI 38.60 kg/m     Wt Readings from Last 3 Encounters:  12/25/19 257 lb 9.6 oz (116.8 kg)  08/23/19 (!) 258 lb (117 kg)  02/07/19 271 lb (122.9 kg)     GEN:  Well nourished, well developed in no acute distress HEENT: Normal NECK: No JVD; No carotid bruits LYMPHATICS: No lymphadenopathy CARDIAC: RRR, no murmurs, rubs, gallops RESPIRATORY:  Clear to auscultation without rales, wheezing or rhonchi  ABDOMEN: Soft, non-tender, non-distended MUSCULOSKELETAL:  No edema; No deformity  SKIN: Warm and dry NEUROLOGIC:  Alert and oriented x 3 PSYCHIATRIC:  Normal affect   ASSESSMENT:    1. Persistent atrial fibrillation ([place])   2. Chronic systolic heart failure ([place])   3. Morbid obesity ([place])    PLAN:    In order of problems listed above:  Permanent atrial fibrillation -Previously Dr. Lovena Le has evaluated him, continuing with rate control strategy.  Has failed Tikosyn and other medications.  Continue with carvedilol 12.5 mg twice a day.  Rate is under reasonable control. -At prior visit in July 2021 we restarted his digoxin 0.125 mg for rate control.  Dilated cardiomyopathy/chronic systolic heart failure -EF 35%.  Continuing with Entresto.  NYHA class I-II.  Been stable.  No changes made.  Hyperlipidemia -He has been utilizing red yeast rice extract.  Did not wish to be on statin therapy.  Given his coronary artery disease, would like for him to be on statin.  Still no changes at this visit  Chronic anticoagulation -For atrial fibrillation.  He has been on Xarelto.  In 2019 he did have a bleeding ulcer.  This was treated and he is now back on Xarelto and doing well.  Last  hemoglobin 17.5  Ongoing dyspnea on exertion -Has seen pulmonary in the past.  Has  a degree of restrictive lung disease from diaphragmatic hernia repair as a child as well as obesity.  Long prior history of smoking.  He quit about 10 to 15 years ago.  Continue with cessation.  Coronary artery disease -His prior heart catheterization in 2010 revealed 80% mid LAD lesion which was successfully stented by Dr. Irish Lack and an occluded right coronary artery with good collateral flow which was opened by PTCA and marginally improved overall flow.  His cardiomyopathy was out of proportion to CAD.  Cardiac cath reviewed again.  Leg pain -Dopplers reviewed from 2018/2019 were negative.  No DVT, no arterial disease.  Shared Decision Making/Informed Consent        Medication Adjustments/Labs and Tests Ordered: Current medicines are reviewed at length with the patient today.  Concerns regarding medicines are outlined above.  No orders of the defined types were placed in this encounter.  No orders of the defined types were placed in this encounter.   Patient Instructions  Medication Instructions:  The current medical regimen is effective;  continue present plan and medications.  *If you need a refill on your cardiac medications before your next appointment, please call your pharmacy*  Follow-Up: At [facility], you and your health needs are our priority.  As part of our continuing mission to provide you with exceptional heart care, we have created designated Provider Care Teams.  These Care Teams include your primary Cardiologist (physician) and Advanced Practice Providers (APPs -  Physician Assistants and Nurse Practitioners) who all work together to provide you with the care you need, when you need it.  We recommend signing up for the patient portal called "MyChart".  Sign up information is provided on this After Visit Summary.  MyChart is used to connect with patients for Virtual Visits  (Telemedicine).  Patients are able to view lab/test results, encounter notes, upcoming appointments, etc.  Non-urgent messages can be sent to your provider as well.   To learn more about what you can do with MyChart, go to [email or web address].    Your next appointment:   6 month(s)  The format for your next appointment:   In Person  Provider:   [name], MD   Thank you for choosing [facility]!!         Signed, [name], MD  12/25/2019 4:36 PM    [place]

## 2019-12-25 NOTE — Patient Instructions (Signed)

## 2020-03-27 ENCOUNTER — Telehealth: Payer: Self-pay | Admitting: *Deleted

## 2020-03-27 NOTE — Telephone Encounter (Signed)
   [facility] Pre-operative Risk Assessment    HEARTCARE STAFF: - Please ensure there is not already an duplicate clearance open for this procedure. - Under Visit Info/Reason for Call, type in Other and utilize the format Clearance MM/DD/YY or Clearance TBD. Do not use dashes or single digits. - If request is for dental extraction, please clarify the # of teeth to be extracted.  Request for surgical clearance:  1. What type of surgery is being performed? 1 TOOTH EXTRACTION AS WELL AS DEEP DENTAL CLEANING   2. When is this surgery scheduled? TBD   3. What type of clearance is required (medical clearance vs. Pharmacy clearance to hold med vs. Both)? BOTH  4. Are there any medications that need to be held prior to surgery and how long? [place]   5. Practice name and name of physician performing surgery? ERIC SADLER, DDS   6. What is the office phone number? [phone]   7.   What is the office fax number? [phone]  8.   Anesthesia type (None, local, MAC, general) ? LOCAL   Julaine Hua 03/27/2020, 11:59 AM  _________________________________________________________________   (provider comments below)

## 2020-03-27 NOTE — Telephone Encounter (Signed)
Our protocol recommends to NOT hold anticoagulation for single dental extractions and deep cleaning.

## 2020-04-14 NOTE — Telephone Encounter (Signed)
° °  Primary Cardiologist: [name], MD  Chart reviewed as part of pre-operative protocol coverage.   Simple dental extractions are considered low risk procedures per guidelines and generally do not require any specific cardiac clearance. It is also generally accepted that for simple extractions and dental cleanings, there is no need to interrupt blood thinner therapy. Our protocol recommends to NOT hold anticoagulation for single dental extractions and deep cleaning.  SBE prophylaxis is not required for the patient from a cardiac standpoint per chart review.  I will route this recommendation to the requesting party via Epic fax function and remove from pre-op pool.  Please call with questions.  Charlie Pitter, PA-C 04/14/2020, 12:41 PM

## 2020-04-14 NOTE — Telephone Encounter (Signed)
Meredith Pel is requesting a callback to confirm Xarelto hold and is also requesting clearance be faxed to [phone]. Please advise.

## 2020-05-05 ENCOUNTER — Other Ambulatory Visit: Payer: Self-pay | Admitting: Cardiology

## 2020-05-07 ENCOUNTER — Other Ambulatory Visit: Payer: Self-pay

## 2020-05-07 MED ORDER — ENTRESTO 49-51 MG PO TABS: 1.0000 | ORAL_TABLET | Freq: Two times a day (BID) | ORAL | 2 refills | Status: DC

## 2020-06-09 ENCOUNTER — Other Ambulatory Visit: Payer: Self-pay | Admitting: Cardiology

## 2020-06-24 ENCOUNTER — Ambulatory Visit (INDEPENDENT_AMBULATORY_CARE_PROVIDER_SITE_OTHER): Payer: Medicare Other | Admitting: Cardiology

## 2020-06-24 ENCOUNTER — Other Ambulatory Visit: Payer: Self-pay

## 2020-06-24 ENCOUNTER — Encounter (INDEPENDENT_AMBULATORY_CARE_PROVIDER_SITE_OTHER): Payer: Self-pay

## 2020-06-24 ENCOUNTER — Encounter: Payer: Self-pay | Admitting: Cardiology

## 2020-06-24 VITALS — BP 120/60 | HR 78 | Ht 68.5 in | Wt 266.0 lb

## 2020-06-24 DIAGNOSIS — I5022 Chronic systolic (congestive) heart failure: Secondary | ICD-10-CM | POA: Diagnosis not present

## 2020-06-24 DIAGNOSIS — I4819 Other persistent atrial fibrillation: Secondary | ICD-10-CM | POA: Diagnosis not present

## 2020-06-24 NOTE — Patient Instructions (Signed)
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
## 2020-08-08 ENCOUNTER — Other Ambulatory Visit: Payer: Self-pay | Admitting: Cardiology

## 2020-08-11 ENCOUNTER — Other Ambulatory Visit: Payer: Self-pay | Admitting: Cardiology

## 2020-08-11 DIAGNOSIS — I480 Paroxysmal atrial fibrillation: Secondary | ICD-10-CM

## 2020-08-11 NOTE — Telephone Encounter (Signed)
Prescription refill request for Xarelto received.  Indication: afib  Last office visit: skains, 06/24/2020 Weight: 120.7 kg  Age: 65 yo  Scr: 0.90 06/26/2019 CrCl: 135 ml/min   Pt is overdue for blood work called and spoke to pt. Scheduled him to come in 8/24 to have blood work. Pt stated he needs 3 month supply informed pt I would send in refill for a 3 month supply.

## 2020-08-19 ENCOUNTER — Telehealth: Payer: Self-pay | Admitting: Cardiology

## 2020-08-19 NOTE — Telephone Encounter (Signed)
Spoke with pt and made him aware ok to use plain Mucinex and Coricidin HBP for his symptoms.  Pt appreciative for call.

## 2020-08-19 NOTE — Telephone Encounter (Signed)
New Message:     Patient wants to know what can he take for a deep sinus congestion along with his other medicine?

## 2020-09-17 ENCOUNTER — Other Ambulatory Visit: Payer: Self-pay

## 2020-09-17 ENCOUNTER — Other Ambulatory Visit: Payer: Medicare Other | Admitting: *Deleted

## 2020-09-17 DIAGNOSIS — I480 Paroxysmal atrial fibrillation: Secondary | ICD-10-CM

## 2020-09-18 LAB — CBC
Hematocrit: 48.4 % (ref 37.5–51.0)
Hemoglobin: 16.9 g/dL (ref 13.0–17.7)
MCH: 33.2 pg — ABNORMAL HIGH (ref 26.6–33.0)
MCHC: 34.9 g/dL (ref 31.5–35.7)
MCV: 95 fL (ref 79–97)
Platelets: 237 10*3/uL (ref 150–450)
RBC: 5.09 x10E6/uL (ref 4.14–5.80)
RDW: 13.1 % (ref 11.6–15.4)
WBC: 6.8 10*3/uL (ref 3.4–10.8)

## 2020-09-18 LAB — BASIC METABOLIC PANEL
BUN/Creatinine Ratio: 12 (ref 10–24)
BUN: 11 mg/dL (ref 8–27)
CO2: 25 mmol/L (ref 20–29)
Calcium: 9.7 mg/dL (ref 8.6–10.2)
Chloride: 99 mmol/L (ref 96–106)
Creatinine, Ser: 0.91 mg/dL (ref 0.76–1.27)
Glucose: 112 mg/dL — ABNORMAL HIGH (ref 65–99)
Potassium: 4.4 mmol/L (ref 3.5–5.2)
Sodium: 140 mmol/L (ref 134–144)
eGFR: 92 mL/min/{1.73_m2} (ref >59)

## 2020-10-13 ENCOUNTER — Telehealth: Payer: Self-pay | Admitting: Cardiology

## 2020-10-13 DIAGNOSIS — Z7689 Persons encountering health services in other specified circumstances: Secondary | ICD-10-CM

## 2020-10-13 DIAGNOSIS — I10 Essential (primary) hypertension: Secondary | ICD-10-CM

## 2020-10-13 NOTE — Telephone Encounter (Signed)
Will have Dr Marlou Porch review for recommendations

## 2020-10-13 NOTE — Telephone Encounter (Signed)
Patient called in asking to get a referral to a PCP in the [facility] network. Please advise

## 2020-10-13 NOTE — Telephone Encounter (Signed)
Please check the website for potential primary care offices around the [place].  I think he would appreciate the location. [name], MD

## 2020-10-13 NOTE — Telephone Encounter (Signed)
Pt aware he has been referred to [facility] at [facility] which is closer to his home.

## 2020-10-14 ENCOUNTER — Other Ambulatory Visit: Payer: Self-pay | Admitting: Cardiology

## 2020-10-23 ENCOUNTER — Encounter: Payer: Self-pay | Admitting: Cardiology

## 2020-10-27 ENCOUNTER — Telehealth: Payer: Self-pay | Admitting: Cardiology

## 2020-10-27 NOTE — Telephone Encounter (Signed)
Spoke with patient who is not currently having any side effects from taking atorvastatin.  His concern is that the atorvastatin calcium is an issue because it has calcium in it.  He read statins actually cause an increase in calcium found in coronary arteries and he would prefer no to take it any longer if there is an alternative.  Advised I will have Dr Marlou Porch review and call back with any new orders.

## 2020-10-27 NOTE — Telephone Encounter (Signed)
   Pt c/o medication issue:  1. Name of Medication: atorvastatin (LIPITOR) 40 MG tablet  2. How are you currently taking this medication (dosage and times per day)? TAKE BY MOUTH 4-5 TIMES MONTHLY  3. Are you having a reaction (difficulty breathing--STAT)?   4. What is your medication issue? Pt been experience his legs locking up again and he is wondering if atorvastatin causing it. He said if he can get a an alternative meds

## 2020-10-28 NOTE — Telephone Encounter (Signed)
Continue with atorvastatin Calcium scores do go up slightly after use of statins but this is because it is stabilizing the plaque. Overall, atorvastatin benefits from reduction of MI and long term plaque build up far outweigh the mild expected increase in calcium score.   Norman Furbish, MD

## 2020-10-29 ENCOUNTER — Other Ambulatory Visit: Payer: Self-pay | Admitting: Cardiology

## 2020-10-29 NOTE — Telephone Encounter (Signed)
Agree.  Zettie Gootee, MD  

## 2020-10-29 NOTE — Telephone Encounter (Signed)
Pt has been informed of recommendations.  He is only taking atorvastatin 40 mg 4-5 times a month. On all the other days he takes red yeast rice.  After more discussion pt reports that on the days he takes atorvastatin he takes it BID.  I adv him the goal would be for him to take 40mg  daily.  He sounds like he's in agreement to start this and will decrease the use of red yeast rice.

## 2020-10-29 NOTE — Telephone Encounter (Signed)
  Pt is calling to f/u, he would like to ask if he can get alternative for lipitor

## 2020-10-30 ENCOUNTER — Encounter: Payer: Self-pay | Admitting: Nurse Practitioner

## 2020-10-30 ENCOUNTER — Ambulatory Visit (INDEPENDENT_AMBULATORY_CARE_PROVIDER_SITE_OTHER): Payer: Medicare Other | Admitting: Nurse Practitioner

## 2020-10-30 ENCOUNTER — Telehealth: Payer: Self-pay

## 2020-10-30 VITALS — BP 128/60 | HR 60 | Ht 68.5 in | Wt 263.0 lb

## 2020-10-30 DIAGNOSIS — D123 Benign neoplasm of transverse colon: Secondary | ICD-10-CM

## 2020-10-30 DIAGNOSIS — K635 Polyp of colon: Secondary | ICD-10-CM

## 2020-10-30 DIAGNOSIS — K621 Rectal polyp: Secondary | ICD-10-CM | POA: Diagnosis not present

## 2020-10-30 DIAGNOSIS — I2510 Atherosclerotic heart disease of native coronary artery without angina pectoris: Secondary | ICD-10-CM | POA: Diagnosis not present

## 2020-10-30 DIAGNOSIS — I2583 Coronary atherosclerosis due to lipid rich plaque: Secondary | ICD-10-CM

## 2020-10-30 NOTE — Telephone Encounter (Signed)
Request for surgical clearance:     Endoscopy Procedure  What type of surgery is being performed?     Colonoscopy  When is this surgery scheduled?     01/20/21  What type of clearance is required ?   Pharmacy  Are there any medications that need to be held prior to surgery and how long? Xarelto 1-2 day  Practice name and name of physician performing surgery?      [facility]  What is your office phone and fax number?      Phone- [phone]  [phone]  Anesthesia type (None, local, MAC, general) ?       MAC

## 2020-10-30 NOTE — Patient Instructions (Addendum)
PROCEDURES: You have been scheduled for a colonoscopy at [facility]. Please follow the written instructions given to you at your visit today. If you use inhalers (even only as needed), please bring them with you on the day of your procedure.  You will be contacted by our office prior to your procedure for directions on holding your Xarelto.  If you do not hear from our office 1 week prior to your scheduled procedure, please call [phone] to discuss.   It was great seeing you today! Thank you for entrusting me with your care and choosing [facility].  Noralyn Pick, CRNP  The [place] [facility] providers would like to encourage you to use [facility] to communicate with providers for non-urgent requests or questions.  Due to long hold times on the telephone, sending your provider a message by [facility] may be faster and more efficient way to get a response. Please allow 48 business hours for a response.  Please remember that this is for non-urgent requests/questions.  If you are age 1 or older, your body mass index should be between 23-30. Your Body mass index is 39.41 kg/m. If this is out of the aforementioned range listed, please consider follow up with your Primary Care Provider.  If you are age 102 or younger, your body mass index should be between 19-25. Your Body mass index is 39.41 kg/m. If this is out of the aformentioned range listed, please consider follow up with your Primary Care Provider.

## 2020-10-30 NOTE — Telephone Encounter (Signed)
   Name: Norman Rodriguez  DOB: [birth year]  MRN: [id number]   Primary Cardiologist: [name], MD  Chart reviewed as part of pre-operative protocol coverage. Patient was contacted 10/30/2020 in reference to pre-operative risk assessment for pending surgery as outlined below.  He said he is currently busy and cannot talk right now. He is awaiting a phone call for a ride and trying to get his luggage. He will call us back tomorrow. I told him to ask for the preop team. In the meantime will route to pharm team for input on anticoag. Last OV 05/2020, doing well at that time.  Charlie Pitter, PA-C 10/30/2020, 4:05 PM
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
## 2020-10-31 NOTE — Telephone Encounter (Signed)
Patient with diagnosis of afib on Xarelto for anticoagulation.    Procedure: endoscopy Date of procedure: 01/20/21  CHA2DS2-VASc Score = 4  This indicates a 4.8% annual risk of stroke. The patient's score is based upon: CHF History: 1 HTN History: 1 Diabetes History: 0 Stroke History: 0 Vascular Disease History: 1 Age Score: 1 Gender Score: 0   CrCl 163mL/min using adjusted body weight due to obesity Platelet count 237K  Per office protocol, patient can hold Xarelto for 1-2 days prior to procedure.

## 2020-10-31 NOTE — Telephone Encounter (Signed)
   Name: MARVELL TAMER  DOB: 1955-12-13  MRN: [id number]   Primary Cardiologist: [name], MD  Chart reviewed as part of pre-operative protocol coverage. Patient was contacted 10/31/2020 in reference to pre-operative risk assessment for pending surgery as outlined below.  AGNES PROBERT was last seen on 06/24/2020 by Dr. Marlou Porch.  Since that day, GUSTABO GORDILLO has done well without significant chest pain or worsening dyspnea.  Therefore, based on ACC/AHA guidelines, the patient would be at acceptable risk for the planned procedure without further cardiovascular testing.   Patient has been instructed to hold his Xarelto for 1 to 2 days prior to the procedure and restart as soon as possible afterward at the surgeon's discretion.  The patient was advised that if he develops new symptoms prior to surgery to contact our office to arrange for a follow-up visit, and he verbalized understanding.  I will route this recommendation to the requesting party via Epic fax function and remove from pre-op pool. Please call with questions.  [place], [place] 10/31/2020, 11:04 AM

## 2020-11-13 NOTE — Telephone Encounter (Signed)
Melissa, pls make sure patient is aware of holding Xarelto for 2 days prior to her colonoscopy at [facility].  See cardiology instructions below. THX

## 2020-11-17 ENCOUNTER — Other Ambulatory Visit: Payer: Self-pay | Admitting: Cardiology

## 2020-11-18 NOTE — Telephone Encounter (Signed)
Prescription refill request for Xarelto received.  Indication: afib  Last office visit: skains, 08/19/2020 Weight: 119.3 kg  Age: 65 yo  Scr: 0.91, 09/17/2020 CrCl: 133 ml/min  Refill sent.

## 2020-12-02 ENCOUNTER — Encounter: Payer: Self-pay | Admitting: Gastroenterology

## 2020-12-29 ENCOUNTER — Telehealth: Payer: Self-pay | Admitting: Cardiology

## 2020-12-29 ENCOUNTER — Telehealth: Payer: Self-pay | Admitting: Gastroenterology

## 2020-12-29 MED ORDER — ATORVASTATIN CALCIUM 40 MG PO TABS: 40.0000 mg | ORAL_TABLET | Freq: Every day | ORAL | 3 refills | Status: DC

## 2020-12-29 NOTE — Telephone Encounter (Signed)
Patient called and stated he did not have transportation nor did he have anyone who could stay with him at the hospital for his procedure on the 27th.  His mother (care partner) will be out of town until after the first of the year.  He also states he is confused about the prep and is afraid he might not take it right.  Wants to speak about alternatives.  Please give patient a call.  Thank you.

## 2020-12-29 NOTE — Telephone Encounter (Signed)
RX sent into pharmacy as requested - Atorvastatin 40 mg daily #90 X 3.

## 2020-12-29 NOTE — Telephone Encounter (Signed)
Pt c/o medication issue:  1. Name of Medication: atorvastatin (LIPITOR) 40 MG tablet  2. How are you currently taking this medication (dosage and times per day)? 1 tablet daily  3. Are you having a reaction (difficulty breathing--STAT)? no  4. What is your medication issue? Patient calling to request a new prescription for 1 tablet daily be sent with 90 day supplies be sent in to his pharmacy. He states Dr. Marlou Porch had wanted to increase the medication and after speaking with Melissa he states he has started taking the atorvastatin daily.

## 2020-12-30 NOTE — Telephone Encounter (Signed)
Patient with hx of colon polyps. Not a candidate for Cologuard. [facility] is not able to do the virtual colonoscopies for the foreseeable future. Option would be to reschedule to a later date. Called the patient to discuss. No answer. Left a message of my call and asked he call us again to discuss.

## 2021-01-05 ENCOUNTER — Inpatient Hospital Stay (HOSPITAL_COMMUNITY)
Admission: EM | Admit: 2021-01-05 | Discharge: 2021-01-07 | DRG: 223 | Disposition: A | Discharge status: Home / Self Care | Payer: Medicare Other | Attending: Cardiology | Admitting: Cardiology

## 2021-01-05 ENCOUNTER — Emergency Department (HOSPITAL_COMMUNITY): Payer: Medicare Other

## 2021-01-05 ENCOUNTER — Encounter (HOSPITAL_COMMUNITY)
Admission: EM | Disposition: A | Discharge status: Home / Self Care | Payer: Self-pay | Source: Home / Self Care | Attending: Cardiovascular Disease

## 2021-01-05 ENCOUNTER — Encounter (HOSPITAL_COMMUNITY): Payer: Self-pay | Admitting: Cardiovascular Disease

## 2021-01-05 ENCOUNTER — Other Ambulatory Visit: Payer: Self-pay

## 2021-01-05 DIAGNOSIS — I25110 Atherosclerotic heart disease of native coronary artery with unstable angina pectoris: Secondary | ICD-10-CM | POA: Diagnosis present

## 2021-01-05 DIAGNOSIS — I255 Ischemic cardiomyopathy: Secondary | ICD-10-CM | POA: Diagnosis present

## 2021-01-05 DIAGNOSIS — I2582 Chronic total occlusion of coronary artery: Secondary | ICD-10-CM | POA: Diagnosis present

## 2021-01-05 DIAGNOSIS — I249 Acute ischemic heart disease, unspecified: Secondary | ICD-10-CM

## 2021-01-05 DIAGNOSIS — Z79899 Other long term (current) drug therapy: Secondary | ICD-10-CM | POA: Diagnosis not present

## 2021-01-05 DIAGNOSIS — Z87891 Personal history of nicotine dependence: Secondary | ICD-10-CM

## 2021-01-05 DIAGNOSIS — J449 Chronic obstructive pulmonary disease, unspecified: Secondary | ICD-10-CM | POA: Diagnosis present

## 2021-01-05 DIAGNOSIS — I214 Non-ST elevation (NSTEMI) myocardial infarction: Secondary | ICD-10-CM

## 2021-01-05 DIAGNOSIS — Z6839 Body mass index (BMI) 39.0-39.9, adult: Secondary | ICD-10-CM

## 2021-01-05 DIAGNOSIS — N179 Acute kidney failure, unspecified: Secondary | ICD-10-CM

## 2021-01-05 DIAGNOSIS — Z7901 Long term (current) use of anticoagulants: Secondary | ICD-10-CM | POA: Diagnosis not present

## 2021-01-05 DIAGNOSIS — Z95818 Presence of other cardiac implants and grafts: Secondary | ICD-10-CM

## 2021-01-05 DIAGNOSIS — Z888 Allergy status to other drugs, medicaments and biological substances status: Secondary | ICD-10-CM

## 2021-01-05 DIAGNOSIS — Z91018 Allergy to other foods: Secondary | ICD-10-CM

## 2021-01-05 DIAGNOSIS — I110 Hypertensive heart disease with heart failure: Secondary | ICD-10-CM | POA: Diagnosis not present

## 2021-01-05 DIAGNOSIS — I5022 Chronic systolic (congestive) heart failure: Secondary | ICD-10-CM

## 2021-01-05 DIAGNOSIS — I4821 Permanent atrial fibrillation: Secondary | ICD-10-CM | POA: Diagnosis present

## 2021-01-05 DIAGNOSIS — I472 Ventricular tachycardia, unspecified: Secondary | ICD-10-CM

## 2021-01-05 DIAGNOSIS — Z955 Presence of coronary angioplasty implant and graft: Secondary | ICD-10-CM | POA: Diagnosis not present

## 2021-01-05 DIAGNOSIS — I2510 Atherosclerotic heart disease of native coronary artery without angina pectoris: Secondary | ICD-10-CM

## 2021-01-05 DIAGNOSIS — I4891 Unspecified atrial fibrillation: Secondary | ICD-10-CM

## 2021-01-05 DIAGNOSIS — N183 Chronic kidney disease, stage 3 unspecified: Secondary | ICD-10-CM | POA: Diagnosis present

## 2021-01-05 DIAGNOSIS — I4510 Unspecified right bundle-branch block: Secondary | ICD-10-CM | POA: Diagnosis present

## 2021-01-05 DIAGNOSIS — I428 Other cardiomyopathies: Secondary | ICD-10-CM | POA: Diagnosis not present

## 2021-01-05 DIAGNOSIS — I4729 Other ventricular tachycardia: Secondary | ICD-10-CM

## 2021-01-05 DIAGNOSIS — Z20822 Contact with and (suspected) exposure to covid-19: Secondary | ICD-10-CM | POA: Diagnosis present

## 2021-01-05 HISTORY — DX: Non-ST elevation (NSTEMI) myocardial infarction: I21.4

## 2021-01-05 HISTORY — PX: LEFT HEART CATH AND CORONARY ANGIOGRAPHY: CATH118249

## 2021-01-05 LAB — DIGOXIN LEVEL: Digoxin Level: 0.3 ng/mL — ABNORMAL LOW (ref 0.8–2.0)

## 2021-01-05 LAB — COMPREHENSIVE METABOLIC PANEL
ALT: 95 U/L — ABNORMAL HIGH (ref 0–44)
AST: 120 U/L — ABNORMAL HIGH (ref 15–41)
Albumin: 3.4 g/dL — ABNORMAL LOW (ref 3.5–5.0)
Alkaline Phosphatase: 53 U/L (ref 38–126)
Anion gap: 13 (ref 5–15)
BUN: 13 mg/dL (ref 8–23)
CO2: 22 mmol/L (ref 22–32)
Calcium: 8.6 mg/dL — ABNORMAL LOW (ref 8.9–10.3)
Chloride: 102 mmol/L (ref 98–111)
Creatinine, Ser: 1.34 mg/dL — ABNORMAL HIGH (ref 0.61–1.24)
GFR, Estimated: 58 mL/min — ABNORMAL LOW (ref >60)
Glucose, Bld: 166 mg/dL — ABNORMAL HIGH (ref 70–99)
Potassium: 4.6 mmol/L (ref 3.5–5.1)
Sodium: 137 mmol/L (ref 135–145)
Total Bilirubin: 1.6 mg/dL — ABNORMAL HIGH (ref 0.3–1.2)
Total Protein: 6.6 g/dL (ref 6.5–8.1)

## 2021-01-05 LAB — CBC WITH DIFFERENTIAL/PLATELET
Abs Immature Granulocytes: 0.06 10*3/uL (ref 0.00–0.07)
Basophils Absolute: 0 10*3/uL (ref 0.0–0.1)
Basophils Relative: 0 %
Eosinophils Absolute: 0 10*3/uL (ref 0.0–0.5)
Eosinophils Relative: 0 %
HCT: 48.2 % (ref 39.0–52.0)
Hemoglobin: 15.9 g/dL (ref 13.0–17.0)
Immature Granulocytes: 1 %
Lymphocytes Relative: 11 %
Lymphs Abs: 1.2 10*3/uL (ref 0.7–4.0)
MCH: 32.3 pg (ref 26.0–34.0)
MCHC: 33 g/dL (ref 30.0–36.0)
MCV: 97.8 fL (ref 80.0–100.0)
Monocytes Absolute: 1.1 10*3/uL — ABNORMAL HIGH (ref 0.1–1.0)
Monocytes Relative: 11 %
Neutro Abs: 8.3 10*3/uL — ABNORMAL HIGH (ref 1.7–7.7)
Neutrophils Relative %: 77 %
Platelets: 171 10*3/uL (ref 150–400)
RBC: 4.93 MIL/uL (ref 4.22–5.81)
RDW: 13.3 % (ref 11.5–15.5)
WBC: 10.8 10*3/uL — ABNORMAL HIGH (ref 4.0–10.5)
nRBC: 0 % (ref 0.0–0.2)

## 2021-01-05 LAB — RESP PANEL BY RT-PCR (FLU A&B, COVID) ARPGX2
Influenza A by PCR: NEGATIVE
Influenza B by PCR: NEGATIVE
SARS Coronavirus 2 by RT PCR: NEGATIVE

## 2021-01-05 LAB — BRAIN NATRIURETIC PEPTIDE: B Natriuretic Peptide: 626.1 pg/mL — ABNORMAL HIGH (ref 0.0–100.0)

## 2021-01-05 LAB — LIPID PANEL
Cholesterol: 134 mg/dL (ref 0–200)
HDL: 51 mg/dL (ref >40)
LDL Cholesterol: 61 mg/dL (ref 0–99)
Total CHOL/HDL Ratio: 2.6 RATIO
Triglycerides: 109 mg/dL (ref <150)
VLDL: 22 mg/dL (ref 0–40)

## 2021-01-05 LAB — TROPONIN I (HIGH SENSITIVITY)
Troponin I (High Sensitivity): 632 ng/L (ref <18)
Troponin I (High Sensitivity): 8329 ng/L (ref <18)

## 2021-01-05 LAB — GLUCOSE, CAPILLARY
Glucose-Capillary: 139 mg/dL — ABNORMAL HIGH (ref 70–99)
Glucose-Capillary: 168 mg/dL — ABNORMAL HIGH (ref 70–99)
Glucose-Capillary: 178 mg/dL — ABNORMAL HIGH (ref 70–99)
Glucose-Capillary: 148 mg/dL — ABNORMAL HIGH (ref 70–99)

## 2021-01-05 LAB — HEMOGLOBIN A1C
Hgb A1c MFr Bld: 6.4 % — ABNORMAL HIGH (ref 4.8–5.6)
Mean Plasma Glucose: 136.98 mg/dL

## 2021-01-05 LAB — PROTIME-INR
INR: 1.8 — ABNORMAL HIGH (ref 0.8–1.2)
Prothrombin Time: 20.9 seconds — ABNORMAL HIGH (ref 11.4–15.2)

## 2021-01-05 LAB — SURGICAL PCR SCREEN
MRSA, PCR: NEGATIVE
Staphylococcus aureus: NEGATIVE

## 2021-01-05 LAB — APTT
aPTT: 33 seconds (ref 24–36)
aPTT: 33 seconds (ref 24–36)

## 2021-01-05 LAB — TSH: TSH: 1.322 u[IU]/mL (ref 0.350–4.500)

## 2021-01-05 LAB — HIV ANTIBODY (ROUTINE TESTING W REFLEX): HIV Screen 4th Generation wRfx: NONREACTIVE

## 2021-01-05 LAB — T4, FREE: Free T4: 0.9 ng/dL (ref 0.61–1.12)

## 2021-01-05 SURGERY — LEFT HEART CATH AND CORONARY ANGIOGRAPHY
Anesthesia: LOCAL

## 2021-01-05 MED ORDER — SODIUM CHLORIDE 0.9% FLUSH: 3.0000 mL | INTRAVENOUS | Status: DC | PRN

## 2021-01-05 MED ORDER — MORPHINE SULFATE (PF) 2 MG/ML IV SOLN: 2.0000 mg | INTRAVENOUS | Status: DC | PRN

## 2021-01-05 MED ORDER — FENTANYL CITRATE (PF) 100 MCG/2ML IJ SOLN
INTRAMUSCULAR | Status: AC
  Filled 2021-01-05: qty 2

## 2021-01-05 MED ORDER — SODIUM CHLORIDE 0.9% FLUSH
3.0000 mL | Freq: Two times a day (BID) | INTRAVENOUS | Status: DC
  Administered 2021-01-05 – 2021-01-06 (×2): 3 mL via INTRAVENOUS

## 2021-01-05 MED ORDER — AMIODARONE HCL IN DEXTROSE 360-4.14 MG/200ML-% IV SOLN
30.0000 mg/h | INTRAVENOUS | Status: DC
  Administered 2021-01-05 – 2021-01-07 (×4): 30 mg/h via INTRAVENOUS
  Filled 2021-01-05 (×4): qty 200

## 2021-01-05 MED ORDER — VERAPAMIL HCL 2.5 MG/ML IV SOLN
INTRA_ARTERIAL | Status: DC | PRN
  Administered 2021-01-05: 15 mL via INTRA_ARTERIAL

## 2021-01-05 MED ORDER — SODIUM CHLORIDE 0.9 % IV SOLN
80.0000 mg | INTRAVENOUS | Status: AC
  Administered 2021-01-06: 80 mg

## 2021-01-05 MED ORDER — CHLORHEXIDINE GLUCONATE 4 % EX LIQD: 60.0000 mL | Freq: Once | CUTANEOUS | Status: DC

## 2021-01-05 MED ORDER — ACETAMINOPHEN 325 MG PO TABS: 650.0000 mg | ORAL_TABLET | ORAL | Status: DC | PRN

## 2021-01-05 MED ORDER — AMIODARONE LOAD VIA INFUSION
150.0000 mg | Freq: Once | INTRAVENOUS | Status: AC
  Administered 2021-01-05: 150 mg via INTRAVENOUS
  Filled 2021-01-05: qty 83.34

## 2021-01-05 MED ORDER — CHLORHEXIDINE GLUCONATE 4 % EX LIQD
60.0000 mL | Freq: Once | CUTANEOUS | Status: AC
  Administered 2021-01-05: 4 via TOPICAL
  Filled 2021-01-05: qty 60

## 2021-01-05 MED ORDER — LIDOCAINE HCL (PF) 1 % IJ SOLN
INTRAMUSCULAR | Status: DC | PRN
  Administered 2021-01-05: 2 mL

## 2021-01-05 MED ORDER — AMIODARONE HCL IN DEXTROSE 360-4.14 MG/200ML-% IV SOLN
INTRAVENOUS | Status: AC
  Filled 2021-01-05: qty 200

## 2021-01-05 MED ORDER — ONDANSETRON HCL 4 MG/2ML IJ SOLN: 4.0000 mg | Freq: Four times a day (QID) | INTRAMUSCULAR | Status: DC | PRN

## 2021-01-05 MED ORDER — HYDRALAZINE HCL 20 MG/ML IJ SOLN: 10.0000 mg | INTRAMUSCULAR | Status: AC | PRN

## 2021-01-05 MED ORDER — HEPARIN SODIUM (PORCINE) 5000 UNIT/ML IJ SOLN
INTRAMUSCULAR | Status: AC
  Administered 2021-01-05: 4000 [IU]
  Filled 2021-01-05: qty 1

## 2021-01-05 MED ORDER — SODIUM CHLORIDE 0.9 % IV SOLN: INTRAVENOUS | Status: DC

## 2021-01-05 MED ORDER — CEFAZOLIN SODIUM-DEXTROSE 2-4 GM/100ML-% IV SOLN: 2.0000 g | INTRAVENOUS | Status: DC

## 2021-01-05 MED ORDER — ASPIRIN 81 MG PO CHEW
CHEWABLE_TABLET | ORAL | Status: AC
  Administered 2021-01-05: 324 mg via ORAL
  Filled 2021-01-05: qty 4

## 2021-01-05 MED ORDER — ATORVASTATIN CALCIUM 80 MG PO TABS: 80.0000 mg | ORAL_TABLET | Freq: Every day | ORAL | Status: DC

## 2021-01-05 MED ORDER — SODIUM CHLORIDE 0.9% FLUSH
3.0000 mL | Freq: Two times a day (BID) | INTRAVENOUS | Status: DC
  Administered 2021-01-05 – 2021-01-07 (×4): 3 mL via INTRAVENOUS

## 2021-01-05 MED ORDER — SODIUM CHLORIDE 0.9 % IV SOLN: 250.0000 mL | INTRAVENOUS | Status: DC | PRN

## 2021-01-05 MED ORDER — ASPIRIN 81 MG PO CHEW
81.0000 mg | CHEWABLE_TABLET | Freq: Every day | ORAL | Status: DC
  Administered 2021-01-06: 81 mg via ORAL
  Filled 2021-01-05: qty 1

## 2021-01-05 MED ORDER — ASPIRIN 81 MG PO CHEW: 81.0000 mg | CHEWABLE_TABLET | ORAL | Status: DC

## 2021-01-05 MED ORDER — ASPIRIN 81 MG PO CHEW: 324.0000 mg | CHEWABLE_TABLET | Freq: Once | ORAL | Status: AC

## 2021-01-05 MED ORDER — CARVEDILOL 12.5 MG PO TABS
12.5000 mg | ORAL_TABLET | Freq: Two times a day (BID) | ORAL | Status: DC
  Administered 2021-01-05 – 2021-01-07 (×4): 12.5 mg via ORAL
  Filled 2021-01-05 (×4): qty 1

## 2021-01-05 MED ORDER — MIDAZOLAM HCL 2 MG/2ML IJ SOLN
INTRAMUSCULAR | Status: AC
  Filled 2021-01-05: qty 2

## 2021-01-05 MED ORDER — CEFAZOLIN SODIUM-DEXTROSE 2-4 GM/100ML-% IV SOLN
2.0000 g | INTRAVENOUS | Status: AC
  Administered 2021-01-06: 2 g via INTRAVENOUS

## 2021-01-05 MED ORDER — VERAPAMIL HCL 2.5 MG/ML IV SOLN
INTRAVENOUS | Status: AC
  Filled 2021-01-05: qty 2

## 2021-01-05 MED ORDER — LABETALOL HCL 5 MG/ML IV SOLN: 10.0000 mg | INTRAVENOUS | Status: AC | PRN

## 2021-01-05 MED ORDER — HEPARIN SODIUM (PORCINE) 5000 UNIT/ML IJ SOLN: 4000.0000 [IU] | Freq: Once | INTRAMUSCULAR | Status: DC

## 2021-01-05 MED ORDER — LIDOCAINE HCL (PF) 1 % IJ SOLN
INTRAMUSCULAR | Status: AC
  Filled 2021-01-05: qty 30

## 2021-01-05 MED ORDER — UMECLIDINIUM-VILANTEROL 62.5-25 MCG/ACT IN AEPB: 1.0000 | INHALATION_SPRAY | Freq: Every day | RESPIRATORY_TRACT | Status: DC

## 2021-01-05 MED ORDER — AMIODARONE HCL IN DEXTROSE 360-4.14 MG/200ML-% IV SOLN
60.0000 mg/h | INTRAVENOUS | Status: AC
  Administered 2021-01-05: 60 mg/h via INTRAVENOUS

## 2021-01-05 MED ORDER — SODIUM CHLORIDE 0.9 % IV SOLN: INTRAVENOUS | Status: AC

## 2021-01-05 MED ORDER — SODIUM CHLORIDE 0.9 % IV SOLN
INTRAVENOUS | Status: DC
  Administered 2021-01-06: 1000 mL via INTRAVENOUS

## 2021-01-05 MED ORDER — HEPARIN (PORCINE) IN NACL 2000-0.9 UNIT/L-% IV SOLN
INTRAVENOUS | Status: DC | PRN
  Administered 2021-01-05: 1000 mL

## 2021-01-05 MED ORDER — HEPARIN SODIUM (PORCINE) 1000 UNIT/ML IJ SOLN
INTRAMUSCULAR | Status: DC | PRN
  Administered 2021-01-05: 6000 [IU] via INTRAVENOUS

## 2021-01-05 MED ORDER — HEPARIN SODIUM (PORCINE) 1000 UNIT/ML IJ SOLN
INTRAMUSCULAR | Status: AC
  Filled 2021-01-05: qty 10

## 2021-01-05 MED ORDER — NITROGLYCERIN 0.4 MG SL SUBL: 0.4000 mg | SUBLINGUAL_TABLET | SUBLINGUAL | Status: DC | PRN

## 2021-01-05 MED ORDER — AMIODARONE HCL IN DEXTROSE 360-4.14 MG/200ML-% IV SOLN
INTRAVENOUS | Status: AC
  Administered 2021-01-05: 60 mg/h via INTRAVENOUS
  Filled 2021-01-05: qty 200

## 2021-01-05 MED ORDER — HEPARIN (PORCINE) IN NACL 2000-0.9 UNIT/L-% IV SOLN
INTRAVENOUS | Status: AC
  Filled 2021-01-05: qty 1000

## 2021-01-05 MED ORDER — SODIUM CHLORIDE 0.9 % IV SOLN: 80.0000 mg | INTRAVENOUS | Status: DC

## 2021-01-05 MED ORDER — HEPARIN (PORCINE) 25000 UT/250ML-% IV SOLN: 1150.0000 [IU]/h | INTRAVENOUS | Status: DC

## 2021-01-05 MED ORDER — ASPIRIN EC 81 MG PO TBEC: 81.0000 mg | DELAYED_RELEASE_TABLET | Freq: Every day | ORAL | Status: DC

## 2021-01-05 MED ORDER — IOHEXOL 350 MG/ML SOLN
INTRAVENOUS | Status: DC | PRN
  Administered 2021-01-05: 60 mL via INTRA_ARTERIAL

## 2021-01-05 MED ORDER — ATORVASTATIN CALCIUM 80 MG PO TABS
80.0000 mg | ORAL_TABLET | Freq: Every day | ORAL | Status: DC
  Administered 2021-01-05 – 2021-01-07 (×3): 80 mg via ORAL
  Filled 2021-01-05 (×3): qty 1

## 2021-01-05 SURGICAL SUPPLY — 13 items
CATH INFINITI 5FR ANG PIGTAIL (CATHETERS) ×2 IMPLANT
CATH OPTITORQUE TIG 4.0 5F (CATHETERS) ×2 IMPLANT
DEVICE RAD COMP TR BAND LRG (VASCULAR PRODUCTS) ×2 IMPLANT
GLIDESHEATH SLEND A-KIT 6F 22G (SHEATH) ×2 IMPLANT
GUIDEWIRE INQWIRE 1.5J.035X260 (WIRE) IMPLANT
INQWIRE 1.5J .035X260CM (WIRE) ×3
KIT HEART LEFT (KITS) ×4 IMPLANT
PACK CARDIAC CATHETERIZATION (CUSTOM PROCEDURE TRAY) ×4 IMPLANT
SHEATH PROBE COVER 6X72 (BAG) ×2 IMPLANT
SYR MEDRAD MARK 7 150ML (SYRINGE) ×4 IMPLANT
TRANSDUCER W/STOPCOCK (MISCELLANEOUS) ×4 IMPLANT
TUBING CIL FLEX 10 FLL-RA (TUBING) ×4 IMPLANT
WIRE HI TORQ VERSACORE-J 145CM (WIRE) ×2 IMPLANT

## 2021-01-05 NOTE — Care Management (Signed)
  Transition of Care [facility]) Screening Note   Patient Details  Name: Norman Rodriguez Date of Birth: [birth year]   Transition of Care [facility]) CM/SW Contact:    Bethena Roys, RN Phone Number: 01/05/2021, 12:53 PM    Transition of Care Department [facility]) has reviewed patient and no TOC needs have been identified at this time. We will continue to monitor patient advancement through interdisciplinary progression rounds. If new patient transition needs arise, please place a TOC consult.

## 2021-01-05 NOTE — H&P (Signed)
Cardiology Admission History and Physical:   Patient ID: DAEKWON BESWICK MRN: [id number]; DOB: 1955/05/27   Admission date: 01/05/2021  PCP:  Loyola Mast, PA-C   [facility] Cardiologist:  [name], MD  Electrophysiologist:  Cristopher Peru, MD  {  Chief Complaint:  "All of a sudden I had that same pain I had when I needed a stent"  Patient Profile:   Norman Rodriguez is a 65 y.o. male with hx of CAD s/p PCI to LAD, HFrEF, permanent atrial fibrillation on Xarelto, COPD, and essential hypertension who is being seen 01/05/2021 for the evaluation of NSTEMI.  History of Present Illness:   Norman Rodriguez has an extensive cardiac history.  He was diagnosed with atrial fibrillation and heart failure back in 2010.  He underwent left heart catheterization and was found to have a severely stenosed LAD which was stented.  He also was found to have a CTO of his RCA but did have good collaterals at the time.  He has had numerous cardioversions in the interim.  His LV systolic function has remained low but stable.  He has been able to tolerate guideline directed medical therapy and has not required frequent hospitalizations.  This morning Mr. Holdren tells me that he has been doing reasonably well until early this morning.  He was awake lying in bed and suddenly felt severe substernal chest pressure.  Tried rolling over onto his left side but this did not improve the discomfort.  This chest pressure was associated with bilateral arm weakness, nausea, and diaphoresis.  Pain persisted for almost 2 hours until he felt he needed to call EMS.  On their arrival he was in monomorphic VT and hypotensive.  He was emergently cardioverted.  On his post cardioversion EKG he was in atrial fibrillation and there was concern for inferior ST elevations. He reports that he has been well compensated from a HF standpoint. Daily lasix seems to keep fluid off. He is not terribly active but until this evening  had not had any significant SOB.   On arrival to the emergency department he was in rate controlled atrial fibrillation with stable blood pressures.  He had significant lateral ST depressions on his initial EKG with borderline inferior elevations.  He still had minimal amount of chest discomfort although this was markedly improved.  His first troponin returned positive at 632.  Past Medical History:  Diagnosis Date   Arthritis    Atrial fibrillation ([place])    a. Dx 2010. b. Initiated on Tikosyn 03/2013.   CAD (coronary artery disease)    a. BMS 2.53mm 23 to LAD. CTO of RCA unsuccessful, good left to right collaterals - 2010.   Chronic anticoagulation    Chronic systolic CHF (congestive heart failure) ([place])    a. Both ischemic and tachy induced. b. Echo 04/2013: EF 30-35%, no RWMA, normal RV, mod dilated LA (before restoration of NSR).   COPD (chronic obstructive pulmonary disease) ([place]) 10/22/2017   Dysrhythmia    hx of atrial fibrilation   HTN (hypertension)    Hyperlipidemia    NSVT (nonsustained ventricular tachycardia)    Obesity    Shortness of breath     Past Surgical History:  Procedure Laterality Date   CARDIOVERSION N/A 04/27/2013   Procedure: BEDSIDE CARDIOVERSION;  Surgeon: Sanda Klein, MD;  Location: [place];  Service: Cardiovascular;  Laterality: N/A;   CARDIOVERSION N/A 05/15/2013   Procedure: CARDIOVERSION/BEDSIDE;  Surgeon: Larey Dresser, MD;  Location: [facility];  Service: Cardiovascular;  Laterality: N/A;   CARDIOVERSION N/A 05/16/2014   Procedure: CARDIOVERSION;  Surgeon: Jerline Pain, MD;  Location: [facility];  Service: Cardiovascular;  Laterality: N/A;   CARDIOVERSION N/A 06/07/2014   Procedure: CARDIOVERSION;  Surgeon: Jerline Pain, MD;  Location: [facility];  Service: Cardiovascular;  Laterality: N/A;   COLONOSCOPY WITH PROPOFOL N/A 10/27/2017   Procedure: COLONOSCOPY WITH PROPOFOL;  Surgeon: Mauri Pole, MD;  Location: [facility];  Service: Endoscopy;   Laterality: N/A;   CORONARY ANGIOPLASTY WITH STENT PLACEMENT  2010   BMS 2.28mm 23 to LAD. CTO of RCA unsuccessful, good left to right collaterals   ESOPHAGOGASTRODUODENOSCOPY (EGD) WITH PROPOFOL N/A 10/24/2017   Procedure: ESOPHAGOGASTRODUODENOSCOPY (EGD) WITH PROPOFOL;  Surgeon: Mauri Pole, MD;  Location: [facility];  Service: Endoscopy;  Laterality: N/A;   FOOT SURGERY Right    POLYPECTOMY  10/27/2017   Procedure: POLYPECTOMY;  Surgeon: Mauri Pole, MD;  Location: [facility];  Service: Endoscopy;;     Medications Prior to Admission: Prior to Admission medications   Medication Sig Start Date End Date Taking? Authorizing Provider  atorvastatin (LIPITOR) 40 MG tablet Take 1 tablet (40 mg total) by mouth daily. 12/29/20  Yes Jerline Pain, MD  carvedilol (COREG) 12.5 MG tablet TAKE ONE TABLET BY MOUTH TWICE A DAY WITH MEALS Patient taking differently: Take 12.5 mg by mouth 2 (two) times daily with a meal. 11/17/20  Yes Jerline Pain, MD  Coenzyme Q10 (CO Q-10) 200 MG CAPS Take 200 mg by mouth daily.   Yes [provider]  digoxin (LANOXIN) 0.125 MG tablet TAKE ONE TABLET BY MOUTH DAILY Patient taking differently: Take 0.125 mg by mouth daily. 08/08/20  Yes Jerline Pain, MD  ENTRESTO 49-51 MG TAKE ONE TABLET BY MOUTH TWICE A DAY Patient taking differently: Take 1 tablet by mouth 2 (two) times daily. 11/17/20  Yes Jerline Pain, MD  fexofenadine (ALLEGRA) 180 MG tablet Take 180 mg by mouth daily.   Yes [provider]  furosemide (LASIX) 40 MG tablet TAKE ONE TABLET BY MOUTH DAILY Patient taking differently: Take 40 mg by mouth daily. 10/29/20  Yes Jerline Pain, MD  gabapentin (NEURONTIN) 300 MG capsule Take 600 mg by mouth at bedtime as needed (carpal tunnel). 09/25/20  Yes [provider]  LevOCARNitine (CARNITINE PO) Take 500 mg by mouth daily.   Yes [provider]  meloxicam (MOBIC) 15 MG tablet Take 15 mg by mouth daily as needed  for pain.   Yes [provider]  Multiple Vitamin (MULTIVITAMIN) tablet Take 1 tablet by mouth daily.   Yes [provider]  nitroGLYCERIN (NITROSTAT) 0.4 MG SL tablet Place 1 tablet (0.4 mg total) under the tongue every 5 (five) minutes as needed for chest pain. 08/23/19  Yes Jerline Pain, MD  NON FORMULARY Take 465 mg by mouth as needed (joint pain). Sharp   Yes [provider]  Omega-3 Fatty Acids (OMEGA 3 PO) Take 1 capsule by mouth daily.   Yes [provider]  rivaroxaban (XARELTO) 20 MG TABS tablet TAKE ONE TABLET BY MOUTH DAILY WITH SUPPER Patient taking differently: Take 20 mg by mouth daily. 11/18/20  Yes Jerline Pain, MD  vitamin C (ASCORBIC ACID) 500 MG tablet Take 500 mg by mouth daily.   Yes [provider]  VITAMIN D, CHOLECALCIFEROL, PO Take 5,000 Units by mouth daily.   Yes [provider]  Celedonio Miyamoto 62.5-25  MCG/INH AEPB INHALE ONE PUFFS INTO THE LUNGS DAILY Patient not taking: Reported on 01/05/2021 05/17/18   Rigoberto Noel, MD     Allergies:    Allergies  Allergen Reactions   Apple Hives   Soybean Oil Other (See Comments)    unknown   Lisinopril Cough   Other Itching    Pt has itching and joint swelling with walnuts, mildew, fungi, basil and peaches   Bean Pod Extract Rash    Green Beans specifically Joint swelling and itching   Carrot [Daucus Carota] Rash    Joint swelling and itching   Grass Extracts [Gramineae Pollens] Rash    Joint swelling and itching   Peach Flavor Rash    Joint swelling and itching   Tree Extract Rash    Joint swelling and itching    Social History:   Social History   Socioeconomic History   Marital status: Single    Spouse name: Not on file   Number of children: Not on file   Years of education: Not on file   Highest education level: Not on file  Occupational History   Not on file  Tobacco Use   Smoking status: Former    Types: Cigars   Smokeless  tobacco: Never   Tobacco comments:    1 cigar per month states he stopped smoking  Vaping Use   Vaping Use: Never used  Substance and Sexual Activity   Alcohol use: No   Drug use: No    Comment: 1 per month...pt states he stopped smoking   Sexual activity: Not on file  Other Topics Concern   Not on file  Social History Narrative   Not on file   Social Determinants of Health   Financial Resource Strain: Not on file  Food Insecurity: Not on file  Transportation Needs: Not on file  Physical Activity: Not on file  Stress: Not on file  Social Connections: Not on file  Intimate Partner Violence: Not on file    Family History:   The patient's family history includes Other in his father. There is no history of Coronary artery disease.    ROS:  Please see the history of present illness.  All other ROS reviewed and negative.     Physical Exam/Data:   Vitals:   01/05/21 0525 01/05/21 0530 01/05/21 0535 01/05/21 0540  BP: 114/72 97/79 107/79 106/68  Pulse: 85 85 91 71  Resp: 16 15 19 18   Temp:      TempSrc:      SpO2: 97% 97% 99% 96%  Weight:      Height:       No intake or output data in the 24 hours ending 01/05/21 0626 Last 3 Weights 01/05/2021 10/30/2020 06/24/2020  Weight (lbs) 260 lb 263 lb 266 lb  Weight (kg) 117.935 kg 119.296 kg 120.657 kg     Body mass index is 39.53 kg/m.  General:  Well nourished, well developed, in no acute distress HEENT: normal Neck: no JVD Vascular: No carotid bruits; Distal pulses 2+ bilaterally   Cardiac:  normal S1, S2; RRR; no murmur  Lungs:  clear to auscultation bilaterally, no wheezing, rhonchi or rales  Abd: soft, nontender, no hepatomegaly  Ext: no edema Musculoskeletal:  No deformities, BUE and BLE strength normal and equal Skin: warm and dry  Neuro:  CNs 2-12 intact, no focal abnormalities noted Psych:  Normal affect   EKG:  The ECG that was done 01/05/21 was personally reviewed and demonstrates  atrial fibrillation, IVCD,  anterolateral ST depressions  Relevant CV Studies: TTE (02/2017):  - Left ventricle: The cavity size was mildly dilated. Wall    thickness was increased in a pattern of mild LVH. Systolic    function was moderately to severely reduced. The estimated    ejection fraction was in the range of 30% to 35%. There is    akinesis of the inferolateral and inferior myocardium. There is    hypokinesis of the anteroseptal and inferoseptal myocardium.   Impressions:   - Technically difficult; definity used; akinesis of the inferior    and inferolateral walls with overall moderate to severe LV    dysfunction; mild LVH and LVE.   Laboratory Data:  High Sensitivity Troponin:   Recent Labs  Lab 01/05/21 0456  TROPONINIHS 632*      Chemistry Recent Labs  Lab 01/05/21 0456  NA 137  K 4.6  CL 102  CO2 22  GLUCOSE 166*  BUN PENDING  CREATININE 1.34*  CALCIUM 8.6*  GFRNONAA 58*  ANIONGAP 13    Recent Labs  Lab 01/05/21 0456  PROT 6.6  ALBUMIN 3.4*  AST 120*  ALT 95*  ALKPHOS 53  BILITOT 1.6*   Lipids No results for input(s): CHOL, TRIG, HDL, LABVLDL, LDLCALC, CHOLHDL in the last 168 hours. Hematology Recent Labs  Lab 01/05/21 0456  WBC 10.8*  RBC 4.93  HGB 15.9  HCT 48.2  MCV 97.8  MCH 32.3  MCHC 33.0  RDW 13.3  PLT 171   Thyroid No results for input(s): TSH, FREET4 in the last 168 hours. BNPNo results for input(s): BNP, PROBNP in the last 168 hours.  DDimer No results for input(s): DDIMER in the last 168 hours.   Radiology/Studies:  DG Chest Port 1 View  Result Date: 01/05/2021 CLINICAL DATA:  65 year old male with history of chest pain. EXAM: PORTABLE CHEST 1 VIEW COMPARISON:  Chest x-ray 10/25/2017. FINDINGS: Lung volumes are normal. No consolidative scotch that opacity at the left base which may reflect atelectasis and/or consolidation, along with a superimposed small left pleural effusion. Right lung is clear. No right pleural effusion. Cephalization of the  pulmonary vasculature, without frank pulmonary edema. Mild cardiomegaly. Upper mediastinal contours are within normal limits. Atherosclerotic calcifications are noted in the thoracic aorta. IMPRESSION: 1. Opacity in the left lower lobe which may reflect atelectasis and/or consolidation, with superimposed small left pleural effusion. 2. Cardiomegaly with pulmonary venous congestion, but no frank pulmonary edema. Electronically Signed   By: Vinnie Langton M.D.   On: 01/05/2021 05:17     Assessment and Plan:   #NSTEMI #CAD - Sudden onset of substernal pressure early this AM. Known CTO of his RCA with prior stenting of his LAD (BMS > 10 years ago). Associated with arm weakness, diaphoresis, nausea. Was in VT on initial presentation to EMS and since cardioverted. Has been relatively chest pain free since  - Very concerning history in patient with known disease. EKG did not meet STEMI criteria but concerning as well given history  - Start heparin ggt - Already loaded with ASA. Start ASA 81 mg tomorrow  - Cont home coreg  - Increase atorva to 80 - Lipids pending. A1c ok.  - Echo ordered - Plan for LHC early this AM   #Monomorphic VT: - While not polymorphic, still concerned this is related to ischemia in the setting of known CAD and significant LV dysfunction  - Management of ACS as above  - Would cont amiodarone ggt for  now  #Chronic Systolic Heart Failure: - Appears relatively euvolemic with recent class II symptoms  - Cont coreg - Would hold entresto given AKI and upcoming intervention  - Hold home dig, lasix for now - Would start SGLT2i when able   #AKI: - Suspect related to ACS  - Holding entresto as above   #Atrial Fibrillation: - Rate controlled - Hold Xarelto   Risk Assessment/Risk Scores:  TIMI Risk Score for Unstable Angina or Non-ST Elevation MI:   The patient's TIMI risk score is  , which indicates a  % risk of all cause mortality, new or recurrent myocardial infarction  or need for urgent revascularization in the next 14 days.{  New York Heart Association (NYHA) Functional Class NYHA Class II  CHA2DS2-VASc Score = 4  This indicates a 4.8% annual risk of stroke. The patient's score is based upon: CHF History: 1 HTN History: 1 Diabetes History: 0 Stroke History: 0 Vascular Disease History: 1 Age Score: 1 Gender Score: 0  Severity of Illness: The appropriate patient status for this patient is INPATIENT. Inpatient status is judged to be reasonable and necessary in order to provide the required intensity of service to ensure the patient's safety. The patient's presenting symptoms, physical exam findings, and initial radiographic and laboratory data in the context of their chronic comorbidities is felt to place them at high risk for further clinical deterioration. Furthermore, it is not anticipated that the patient will be medically stable for discharge from the hospital within 2 midnights of admission.   * I certify that at the point of admission it is my clinical judgment that the patient will require inpatient hospital care spanning beyond 2 midnights from the point of admission due to high intensity of service, high risk for further deterioration and high frequency of surveillance required.*   For questions or updates, please contact [facility] [place] Please consult [email or web address] for contact info under     Signed, Ronaldo Miyamoto, MD  01/05/2021 6:26 AM

## 2021-01-05 NOTE — Consult Note (Addendum)
ELECTROPHYSIOLOGY CONSULT NOTE    Patient ID: Norman Rodriguez MRN: [id number], DOB/AGE: Jan 30, 1955 65 y.o.  Admit date: 01/05/2021 Date of Consult: 01/05/2021  Primary Physician: Loyola Mast, PA-C Primary Cardiologist: [name], MD  Electrophysiologist: Dr. Lovena Le (Last seen 2016)  Referring Provider: Dr. Tamala Julian  Patient Profile: Norman Rodriguez is a 65 y.o. male with a history of tachy induced CM, CAD s/p PCI to LAD, COP, HTN, and permanent atrial fibrillation having failed amiodarone and tikosyn.  who is being seen today for the evaluation of VT at the request of Dr. Tamala Julian.  HPI:  Norman Rodriguez is a 65 y.o. male with medical history as above.   Echo 02/2017 LVEF 30-35%. PTA meds included coreg, entresto, lasix, and Xarelto.   Pt presented to [facility] 01/05/2021 with sudden severe chest pressure associated with bilateral arm weakness, nausea, and diaphoresis. Pain persisted for ~2 hours until he called for EMS. On arrival was in monomorphic VT and hypotension. He was emergently cardioverted.    Post cardioversion showed AF with some concern for inferior ST elevation.   Pertinent labs on admission include K 4.6, Cr 1.34, HS trop 632, WBC 10.8, Hgb 15.9. COVID and flu negative.   Started on amiodarone for VT (Previously failed for AF due to ineffectiveness)  Currently, he denies chest pain, palpitations, dyspnea, PND, orthopnea, nausea, vomiting, dizziness, syncope, edema, weight gain, or early satiety. A bit overwhelmed by everything going on.  Was his USOH until these symptoms started.   Cath today with   Prox Cx to Mid Cx lesion is 70% stenosed.   Prox RCA to Mid RCA lesion is 90% stenosed.   Mid RCA lesion is 100% stenosed.   Previously placed Prox LAD to Mid LAD stent (unknown type) is  widely patent.   There is severe left ventricular systolic dysfunction.   LV end diastolic pressure is mildly elevated.   The left ventricular ejection fraction is 25-35% by  visual estimate.    Past Medical History:  Diagnosis Date   Arthritis    Atrial fibrillation ([place])    a. Dx 2010. b. Initiated on Tikosyn 03/2013.   CAD (coronary artery disease)    a. BMS 2.74mm 23 to LAD. CTO of RCA unsuccessful, good left to right collaterals - 2010.   Chronic anticoagulation    Chronic systolic CHF (congestive heart failure) ([place])    a. Both ischemic and tachy induced. b. Echo 04/2013: EF 30-35%, no RWMA, normal RV, mod dilated LA (before restoration of NSR).   COPD (chronic obstructive pulmonary disease) ([place]) 10/22/2017   Dysrhythmia    hx of atrial fibrilation   HTN (hypertension)    Hyperlipidemia    NSVT (nonsustained ventricular tachycardia)    Obesity    Shortness of breath      Surgical History:  Past Surgical History:  Procedure Laterality Date   CARDIOVERSION N/A 04/27/2013   Procedure: BEDSIDE CARDIOVERSION;  Surgeon: Sanda Klein, MD;  Location: [place];  Service: Cardiovascular;  Laterality: N/A;   CARDIOVERSION N/A 05/15/2013   Procedure: CARDIOVERSION/BEDSIDE;  Surgeon: Larey Dresser, MD;  Location: [place];  Service: Cardiovascular;  Laterality: N/A;   CARDIOVERSION N/A 05/16/2014   Procedure: CARDIOVERSION;  Surgeon: Jerline Pain, MD;  Location: [place];  Service: Cardiovascular;  Laterality: N/A;   CARDIOVERSION N/A 06/07/2014   Procedure: CARDIOVERSION;  Surgeon: Jerline Pain, MD;  Location: [facility] ENDOSCOPY;  Service: Cardiovascular;  Laterality: N/A;   COLONOSCOPY WITH PROPOFOL N/A 10/27/2017  Procedure: COLONOSCOPY WITH PROPOFOL;  Surgeon: Mauri Pole, MD;  Location: [facility] ENDOSCOPY;  Service: Endoscopy;  Laterality: N/A;   CORONARY ANGIOPLASTY WITH STENT PLACEMENT  2010   BMS 2.55mm 23 to LAD. CTO of RCA unsuccessful, good left to right collaterals   ESOPHAGOGASTRODUODENOSCOPY (EGD) WITH PROPOFOL N/A 10/24/2017   Procedure: ESOPHAGOGASTRODUODENOSCOPY (EGD) WITH PROPOFOL;  Surgeon: Mauri Pole, MD;  Location: [facility] ENDOSCOPY;   Service: Endoscopy;  Laterality: N/A;   FOOT SURGERY Right    POLYPECTOMY  10/27/2017   Procedure: POLYPECTOMY;  Surgeon: Mauri Pole, MD;  Location: [facility] ENDOSCOPY;  Service: Endoscopy;;     Medications Prior to Admission  Medication Sig Dispense Refill Last Dose   atorvastatin (LIPITOR) 40 MG tablet Take 1 tablet (40 mg total) by mouth daily. 90 tablet 3 01/04/2021   carvedilol (COREG) 12.5 MG tablet TAKE ONE TABLET BY MOUTH TWICE A DAY WITH MEALS (Patient taking differently: Take 12.5 mg by mouth 2 (two) times daily with a meal.) 180 tablet 0 01/04/2021 at 2100   Coenzyme Q10 (CO Q-10) 200 MG CAPS Take 200 mg by mouth daily.   01/04/2021   digoxin (LANOXIN) 0.125 MG tablet TAKE ONE TABLET BY MOUTH DAILY (Patient taking differently: Take 0.125 mg by mouth daily.) 90 tablet 3 01/04/2021   ENTRESTO 49-51 MG TAKE ONE TABLET BY MOUTH TWICE A DAY (Patient taking differently: Take 1 tablet by mouth 2 (two) times daily.) 180 tablet 1 01/04/2021   fexofenadine (ALLEGRA) 180 MG tablet Take 180 mg by mouth daily.   01/04/2021   furosemide (LASIX) 40 MG tablet TAKE ONE TABLET BY MOUTH DAILY (Patient taking differently: Take 40 mg by mouth daily.) 90 tablet 2 01/04/2021   gabapentin (NEURONTIN) 300 MG capsule Take 600 mg by mouth at bedtime as needed (carpal tunnel).   Past Week   LevOCARNitine (CARNITINE PO) Take 500 mg by mouth daily.   01/04/2021   meloxicam (MOBIC) 15 MG tablet Take 15 mg by mouth daily as needed for pain.   01/04/2021   Multiple Vitamin (MULTIVITAMIN) tablet Take 1 tablet by mouth daily.   01/04/2021   nitroGLYCERIN (NITROSTAT) 0.4 MG SL tablet Place 1 tablet (0.4 mg total) under the tongue every 5 (five) minutes as needed for chest pain. 25 tablet 6 unk   NON FORMULARY Take 465 mg by mouth as needed (joint pain). HiActives Tart Cherry   unk   Omega-3 Fatty Acids (OMEGA 3 PO) Take 1 capsule by mouth daily.   01/04/2021   rivaroxaban (XARELTO) 20 MG TABS tablet TAKE ONE TABLET  BY MOUTH DAILY WITH SUPPER (Patient taking differently: Take 20 mg by mouth daily.) 90 tablet 1 01/04/2021 at 0900   vitamin C (ASCORBIC ACID) 500 MG tablet Take 500 mg by mouth daily.   01/04/2021   VITAMIN D, CHOLECALCIFEROL, PO Take 5,000 Units by mouth daily.   01/04/2021   ANORO ELLIPTA 62.5-25 MCG/INH AEPB INHALE ONE PUFFS INTO THE LUNGS DAILY (Patient not taking: Reported on 01/05/2021) 60 each 3 Not Taking    Inpatient Medications:   [MAR Hold] aspirin EC  81 mg Oral Daily   [MAR Hold] atorvastatin  80 mg Oral Daily   [MAR Hold] carvedilol  12.5 mg Oral BID WC    Allergies:  Allergies  Allergen Reactions   Apple Hives   Soybean Oil Other (See Comments)    unknown   Lisinopril Cough   Other Itching    Pt has itching and joint swelling with walnuts,  mildew, fungi, basil and peaches   Bean Pod Extract Rash    Green Beans specifically Joint swelling and itching   Carrot [Daucus Carota] Rash    Joint swelling and itching   Grass Extracts [Gramineae Pollens] Rash    Joint swelling and itching   Peach Flavor Rash    Joint swelling and itching   Tree Extract Rash    Joint swelling and itching    Social History   Socioeconomic History   Marital status: Single    Spouse name: Not on file   Number of children: Not on file   Years of education: Not on file   Highest education level: Not on file  Occupational History   Not on file  Tobacco Use   Smoking status: Former    Types: Cigars   Smokeless tobacco: Never   Tobacco comments:    1 cigar per month states he stopped smoking  Vaping Use   Vaping Use: Never used  Substance and Sexual Activity   Alcohol use: No   Drug use: No    Comment: 1 per month...pt states he stopped smoking   Sexual activity: Not on file  Other Topics Concern   Not on file  Social History Narrative   Not on file   Social Determinants of Health   Financial Resource Strain: Not on file  Food Insecurity: Not on file  Transportation  Needs: Not on file  Physical Activity: Not on file  Stress: Not on file  Social Connections: Not on file  Intimate Partner Violence: Not on file     Family History  Problem Relation Age of Onset   Other Father        Car accident   Coronary artery disease Neg Hx      Review of Systems: All other systems reviewed and are otherwise negative except as noted above.  Physical Exam: Vitals:   01/05/21 0530 01/05/21 0535 01/05/21 0540 01/05/21 0757  BP: 97/79 107/79 106/68   Pulse: 85 91 71   Resp: 15 19 18    Temp:      TempSrc:      SpO2: 97% 99% 96% 96%  Weight:      Height:        GEN- The patient is well appearing, alert and oriented x 3 today.   HEENT: normocephalic, atraumatic; sclera clear, conjunctiva pink; hearing intact; oropharynx clear; neck supple Lungs- Clear to ausculation bilaterally, normal work of breathing.  No wheezes, rales, rhonchi Heart- Regular rate and rhythm, no murmurs, rubs or gallops GI- soft, non-tender, non-distended, bowel sounds present Extremities- no clubbing, cyanosis, or edema; DP/PT/radial pulses 2+ bilaterally MS- no significant deformity or atrophy Skin- warm and dry, no rash or lesion Psych- euthymic mood, full affect Neuro- strength and sensation are intact  Labs:   Lab Results  Component Value Date   WBC 10.8 (H) 01/05/2021   HGB 15.9 01/05/2021   HCT 48.2 01/05/2021   MCV 97.8 01/05/2021   PLT 171 01/05/2021    Recent Labs  Lab 01/05/21 0456  NA 137  K 4.6  CL 102  CO2 22  BUN 13  CREATININE 1.34*  CALCIUM 8.6*  PROT 6.6  BILITOT 1.6*  ALKPHOS 53  ALT 95*  AST 120*  GLUCOSE 166*      Radiology/Studies: DG Chest Port 1 View  Result Date: 01/05/2021 CLINICAL DATA:  65 year old male with history of chest pain. EXAM: PORTABLE CHEST 1 VIEW COMPARISON:  Chest x-ray 10/25/2017. FINDINGS:  Lung volumes are normal. No consolidative scotch that opacity at the left base which may reflect atelectasis and/or  consolidation, along with a superimposed small left pleural effusion. Right lung is clear. No right pleural effusion. Cephalization of the pulmonary vasculature, without frank pulmonary edema. Mild cardiomegaly. Upper mediastinal contours are within normal limits. Atherosclerotic calcifications are noted in the thoracic aorta. IMPRESSION: 1. Opacity in the left lower lobe which may reflect atelectasis and/or consolidation, with superimposed small left pleural effusion. 2. Cardiomegaly with pulmonary venous congestion, but no frank pulmonary edema. Electronically Signed   By: Vinnie Langton M.D.   On: 01/05/2021 05:17    EKG: baseline EKG 07/2019 shows AF with narrow QRS EKG on arrival today NSR at 88 bpm (personally reviewed)  TELEMETRY: atrial fibrillation 70-80s (personally reviewed)  Assessment/Plan: 1.  Monomorphic VT Rhythm strip as above. Quiescent thus far on IV amiodarone  Explained risks, benefits, and alternatives to ICD implantation, including but not limited to bleeding, infection, pneumothorax, pericardial effusion, lead dislodgement, heart attack, stroke, or death.  Pt verbalized understanding and agrees to proceed  2. NSTEMI/ CAD Cath today with stable disease and no target lesion Maximize medical therapy  3. Chronic systolic CHF Echo 5638 LVEF 30-35% Repeat pending, but indicated for ICD with narrow QRS either way.  GDMT including ARNi and BB PTA.   4. Permanent atrial fibrillation Rate controlled Xarelto on hold for procedures Resumption pending procedures Digoxin level stable.  Dr. Lovena Le to see  For questions or updates, please contact [facility] Please consult [email or web address] for contact info under Cardiology/STEMI.  Jacalyn Lefevre, PA-C  01/05/2021 8:40 AM  EP Attending  Patient seen and examined. Agree with the findings as noted above. The patient is a pleasant man with a h/o longstanding atrial fib who failed AA drug therapy years ago.  He was relegated to rate control and has been stable until yesterday when he presented with sustained hemodynamically untolerated MMVT with a RBBB QRS morphology. He require DCCV. He has undergone left heart cath with no targets for intervention. He is referred for eval. On exam he is a morbidly obese man , NAD, with clear lungs and an IRIR rhythm. Ext with 2+ edema. Tele with atrial fib A/P Sustained MMVT - I have discussed the treatment options with the patient and recommended ICD insertion. The patient is not sure he wants an ICD and wants to think about it overnight. He understands that it will prolong his time in the hospital. I will confer with Dr. Curt Bears and he will see him tomorrow about ICD tomorrow as he refuses me today.  CAD - he denies anginal symptoms and his stents are patent. He denies anginal symptoms.   Carleene Overlie Fynn Vanblarcom,MD

## 2021-01-05 NOTE — Progress Notes (Signed)
ANTICOAGULATION CONSULT NOTE - Initial Consult  Pharmacy Consult for heparin Indication: chest pain/ACS  Allergies  Allergen Reactions   Apple Hives   Soybean Oil Other (See Comments)    unknown   Lisinopril Cough   Other Itching    Pt has itching and joint swelling with walnuts, mildew, fungi, basil and peaches   Bean Pod Extract Rash    Green Beans specifically Joint swelling and itching   Carrot [Daucus Carota] Rash    Joint swelling and itching   Grass Extracts [Gramineae Pollens] Rash    Joint swelling and itching   Peach Flavor Rash    Joint swelling and itching   Tree Extract Rash    Joint swelling and itching    Patient Measurements: Height: 5\' 8"  (172.7 cm) Weight: 117.9 kg (260 lb) IBW/kg (Calculated) : 68.4 Heparin Dosing Weight: 95.2 kg  Vital Signs: Temp: 96.8 F (36 C) (12/12 0504) Temp Source: Temporal (12/12 0504) BP: 106/68 (12/12 0540) Pulse Rate: 71 (12/12 0540)  Labs: Recent Labs    01/05/21 0456  HGB 15.9  HCT 48.2  PLT 171  APTT 33  LABPROT 20.9*  INR 1.8*  CREATININE 1.34*  TROPONINIHS 632*    Estimated Creatinine Clearance: 66.7 mL/min (A) (by C-G formula based on SCr of 1.34 mg/dL (H)).   Medical History: Past Medical History:  Diagnosis Date   Arthritis    Atrial fibrillation ([place])    a. Dx 2010. b. Initiated on Tikosyn 03/2013.   CAD (coronary artery disease)    a. BMS 2.42mm 23 to LAD. CTO of RCA unsuccessful, good left to right collaterals - 2010.   Chronic anticoagulation    Chronic systolic CHF (congestive heart failure) ([place])    a. Both ischemic and tachy induced. b. Echo 04/2013: EF 30-35%, no RWMA, normal RV, mod dilated LA (before restoration of NSR).   COPD (chronic obstructive pulmonary disease) ([place]) 10/22/2017   Dysrhythmia    hx of atrial fibrilation   HTN (hypertension)    Hyperlipidemia    NSVT (nonsustained ventricular tachycardia)    Obesity    Shortness of breath     Medications: see  MAR  Assessment: 65 yo F with Vtach post cardioversion w/ EMS - EKG with concern for inferior elevation with lateral depression and elevation in aVR. No STEMI activated at this time. Heparin bolus in given in ED prior to heparin consult - pt is on a DOAC prior to admission, last dose of Xarelto yest AM per patient report.   CBC baseline, no s/sx of bleeding.   Goal of Therapy:  Heparin level 0.3-0.7 units/ml aPTT 66-102 seconds Monitor platelets by anticoagulation protocol: Yes   Plan: Given that bolus given in ED prior to heparin consult will start heparin gtt at 1150 units/hr (12u/kg/hr) F/u 6h aPTT/HL Monitor daily levels and CBC   Joetta Manners, PharmD, [facility] Emergency Medicine Clinical Pharmacist ED RPh Phone: [place]: [phone]

## 2021-01-05 NOTE — H&P (View-Only) (Signed)
The patient has been seen in conjunction with Fabian Sharp, PAC. All aspects of care have been considered and discussed. The patient has been personally interviewed, examined, and all clinical data has been reviewed.  All data reviewed.  Chronic ischemic cardiomyopathy.  Sudden onset of chest pain followed by monomorphic ventricular tachycardia requiring on scene defibrillation. Persisting mild chest discomfort. CKD 3, potassium 4.6, troponin 632, N-terminal proBNP 2250, postconversion EKG with diffuse mild ST depression except mild inferior ST elevation in lead III and aVF. Acute coronary syndrome with electrical instability. Agree with IV amnio; urgent coronary angiography to define anatomy; further management depending upon anatomic findings.   Progress Note  Patient Name: Norman Rodriguez Date of Encounter: 01/05/2021  [facility] Cardiologist: [name], MD   Subjective   Pt states CP is abut 3/10 currently  Inpatient Medications    Scheduled Meds:  [START ON 01/06/2021] aspirin EC  81 mg Oral Daily   atorvastatin  80 mg Oral Daily   carvedilol  12.5 mg Oral BID WC   heparin  4,000 Units Intravenous Once   umeclidinium-vilanterol  1 puff Inhalation Daily   Continuous Infusions:  sodium chloride 10 mL/hr at 01/05/21 0506   amiodarone 60 mg/hr (01/05/21 0528)   Followed by   amiodarone     PRN Meds: acetaminophen, nitroGLYCERIN, ondansetron (ZOFRAN) IV   Vital Signs    Vitals:   01/05/21 0525 01/05/21 0530 01/05/21 0535 01/05/21 0540  BP: 114/72 97/79 107/79 106/68  Pulse: 85 85 91 71  Resp: 16 15 19 18   Temp:      TempSrc:      SpO2: 97% 97% 99% 96%  Weight:      Height:       No intake or output data in the 24 hours ending 01/05/21 0649 Last 3 Weights 01/05/2021 10/30/2020 06/24/2020  Weight (lbs) 260 lb 263 lb 266 lb  Weight (kg) 117.935 kg 119.296 kg 120.657 kg      Telemetry    Afib in the 70s, PVCs - Personally Reviewed  ECG    No new  tracings - Personally Reviewed  Physical Exam   GEN: obese male, in no acute distress.   Neck: No JVD Cardiac: irregular rhythm, regular rate, no murmur  Respiratory: anterior lung exam without crackles GI: Soft, nontender, non-distended  MS: No edema; No deformity. Neuro:  Nonfocal  Psych: Normal affect   Labs    High Sensitivity Troponin:   Recent Labs  Lab 01/05/21 0456  TROPONINIHS 632*     Chemistry Recent Labs  Lab 01/05/21 0456  NA 137  K 4.6  CL 102  CO2 22  GLUCOSE 166*  BUN 13  CREATININE 1.34*  CALCIUM 8.6*  PROT 6.6  ALBUMIN 3.4*  AST 120*  ALT 95*  ALKPHOS 53  BILITOT 1.6*  GFRNONAA 58*  ANIONGAP 13    Lipids  Recent Labs  Lab 01/05/21 0456  CHOL 134  TRIG 109  HDL 51  LDLCALC 61  CHOLHDL 2.6    Hematology Recent Labs  Lab 01/05/21 0456  WBC 10.8*  RBC 4.93  HGB 15.9  HCT 48.2  MCV 97.8  MCH 32.3  MCHC 33.0  RDW 13.3  PLT 171   Thyroid No results for input(s): TSH, FREET4 in the last 168 hours.  BNPNo results for input(s): BNP, PROBNP in the last 168 hours.  DDimer No results for input(s): DDIMER in the last 168 hours.   Radiology    DG  Chest Port 1 View  Result Date: 01/05/2021 CLINICAL DATA:  65 year old male with history of chest pain. EXAM: PORTABLE CHEST 1 VIEW COMPARISON:  Chest x-ray 10/25/2017. FINDINGS: Lung volumes are normal. No consolidative scotch that opacity at the left base which may reflect atelectasis and/or consolidation, along with a superimposed small left pleural effusion. Right lung is clear. No right pleural effusion. Cephalization of the pulmonary vasculature, without frank pulmonary edema. Mild cardiomegaly. Upper mediastinal contours are within normal limits. Atherosclerotic calcifications are noted in the thoracic aorta. IMPRESSION: 1. Opacity in the left lower lobe which may reflect atelectasis and/or consolidation, with superimposed small left pleural effusion. 2. Cardiomegaly with pulmonary venous  congestion, but no frank pulmonary edema. Electronically Signed   By: Vinnie Langton M.D.   On: 01/05/2021 05:17    Cardiac Studies   Echo - pending  Echo 2019 Study Conclusions   - Left ventricle: The cavity size was mildly dilated. Wall    thickness was increased in a pattern of mild LVH. Systolic    function was moderately to severely reduced. The estimated    ejection fraction was in the range of 30% to 35%. There is    akinesis of the inferolateral and inferior myocardium. There is    hypokinesis of the anteroseptal and inferoseptal myocardium.   Impressions:   - Technically difficult; definity used; akinesis of the inferior    and inferolateral walls with overall moderate to severe LV    dysfunction; mild LVH and LVE.   Patient Profile     65 y.o. male  with hx of CAD s/p PCI to LAD, HFrEF, permanent atrial fibrillation on Xarelto, COPD, and essential hypertension who is being seen 01/05/2021 for the evaluation of NSTEMI.  Assessment & Plan    CAD NSTEMI - known CTO of RCA - prior BMS to LAD ?2010 - presenting with CP concerning for unstable angina - initial troponin 632 - holding entresto, lasix, digoxin in anticipation for heart cath this morning - plan for left heart cath this morning   Wide complex tachycardia - appears consistent with VT Known Afib - monomorphic - amiodarone gtt running - s/p DCCV in ER - concerning for ischemia - cath today - may need EP referral for consideration of ICD   Chronic anticoagulation - last dose of xarelto was yesterday morning - continue heparin   ICM/NICM Chronic systolic heart failure - EF 30-35% - repeat echo - maintained on entrseto, lasix   Permanent Afib - successfully cardioverted 04/2013 - unfortunately, returned to Afib and underwent 2 additiona DCCV that were unsuccessful - 05/16/14 and 06/07/14 - he has also failed tikosyn and amiodarone - now with rate control only - coreg, digoxin   AKI - sCr  1.34 - baseline appears to be 0.8-0.9 - will gently hydrate given CHF     For questions or updates, please contact [facility] Please consult [email or web address] for contact info under        Signed, Ledora Bottcher, PA  01/05/2021, 6:49 AM

## 2021-01-05 NOTE — ED Triage Notes (Signed)
Pt bib [facility] for original call out of chest pain and dizziness. EMS found pt to be in vtach  at 200 bpm with weak radial pulses but conscious the entire time. Initial pressure 74/30. Cardioverted at 100 J  - converted to afib. Hx of afib and vtach. 364ml NS given PTA. BP: 102/40

## 2021-01-05 NOTE — Interval H&P Note (Signed)
Cath Lab Visit (complete for each Cath Lab visit)  Clinical Evaluation Leading to the Procedure:   ACS: Yes.    Non-ACS:    Anginal Classification: CCS II  Anti-ischemic medical therapy: Minimal Therapy (1 class of medications)  Non-Invasive Test Results: No non-invasive testing performed  Prior CABG: No previous CABG      History and Physical Interval Note:  01/05/2021 7:53 AM  Norman Rodriguez  has presented today for surgery, with the diagnosis of unstable angina - vt.  The various methods of treatment have been discussed with the patient and family. After consideration of risks, benefits and other options for treatment, the patient has consented to  Procedure(s): LEFT HEART CATH AND CORONARY ANGIOGRAPHY (N/A) as a surgical intervention.  The patient's history has been reviewed, patient examined, no change in status, stable for surgery.  I have reviewed the patient's chart and labs.  Questions were answered to the patient's satisfaction.     Quay Burow

## 2021-01-05 NOTE — ED Notes (Signed)
EDP at bedside  

## 2021-01-05 NOTE — Progress Notes (Signed)
EP consult placed for presenting VT, unchanged CAD, now lower EF. Echo pending.

## 2021-01-05 NOTE — Progress Notes (Addendum)
The patient has been seen in conjunction with Fabian Sharp, PAC. All aspects of care have been considered and discussed. The patient has been personally interviewed, examined, and all clinical data has been reviewed.  All data reviewed.  Chronic ischemic cardiomyopathy.  Sudden onset of chest pain followed by monomorphic ventricular tachycardia requiring on scene defibrillation. Persisting mild chest discomfort. CKD 3, potassium 4.6, troponin 632, N-terminal proBNP 2250, postconversion EKG with diffuse mild ST depression except mild inferior ST elevation in lead III and aVF. Acute coronary syndrome with electrical instability. Agree with IV amnio; urgent coronary angiography to define anatomy; further management depending upon anatomic findings.   Progress Note  Patient Name: Norman Rodriguez Date of Encounter: 01/05/2021  [facility] Cardiologist: [name], MD   Subjective   Pt states CP is abut 3/10 currently  Inpatient Medications    Scheduled Meds:  [START ON 01/06/2021] aspirin EC  81 mg Oral Daily   atorvastatin  80 mg Oral Daily   carvedilol  12.5 mg Oral BID WC   heparin  4,000 Units Intravenous Once   umeclidinium-vilanterol  1 puff Inhalation Daily   Continuous Infusions:  sodium chloride 10 mL/hr at 01/05/21 0506   amiodarone 60 mg/hr (01/05/21 0528)   Followed by   amiodarone     PRN Meds: acetaminophen, nitroGLYCERIN, ondansetron (ZOFRAN) IV   Vital Signs    Vitals:   01/05/21 0525 01/05/21 0530 01/05/21 0535 01/05/21 0540  BP: 114/72 97/79 107/79 106/68  Pulse: 85 85 91 71  Resp: 16 15 19 18   Temp:      TempSrc:      SpO2: 97% 97% 99% 96%  Weight:      Height:       No intake or output data in the 24 hours ending 01/05/21 0649 Last 3 Weights 01/05/2021 10/30/2020 06/24/2020  Weight (lbs) 260 lb 263 lb 266 lb  Weight (kg) 117.935 kg 119.296 kg 120.657 kg      Telemetry    Afib in the 70s, PVCs - Personally Reviewed  ECG    No new  tracings - Personally Reviewed  Physical Exam   GEN: obese male, in no acute distress.   Neck: No JVD Cardiac: irregular rhythm, regular rate, no murmur  Respiratory: anterior lung exam without crackles GI: Soft, nontender, non-distended  MS: No edema; No deformity. Neuro:  Nonfocal  Psych: Normal affect   Labs    High Sensitivity Troponin:   Recent Labs  Lab 01/05/21 0456  TROPONINIHS 632*     Chemistry Recent Labs  Lab 01/05/21 0456  NA 137  K 4.6  CL 102  CO2 22  GLUCOSE 166*  BUN 13  CREATININE 1.34*  CALCIUM 8.6*  PROT 6.6  ALBUMIN 3.4*  AST 120*  ALT 95*  ALKPHOS 53  BILITOT 1.6*  GFRNONAA 58*  ANIONGAP 13    Lipids  Recent Labs  Lab 01/05/21 0456  CHOL 134  TRIG 109  HDL 51  LDLCALC 61  CHOLHDL 2.6    Hematology Recent Labs  Lab 01/05/21 0456  WBC 10.8*  RBC 4.93  HGB 15.9  HCT 48.2  MCV 97.8  MCH 32.3  MCHC 33.0  RDW 13.3  PLT 171   Thyroid No results for input(s): TSH, FREET4 in the last 168 hours.  BNPNo results for input(s): BNP, PROBNP in the last 168 hours.  DDimer No results for input(s): DDIMER in the last 168 hours.   Radiology    DG  Chest Port 1 View  Result Date: 01/05/2021 CLINICAL DATA:  65 year old male with history of chest pain. EXAM: PORTABLE CHEST 1 VIEW COMPARISON:  Chest x-ray 10/25/2017. FINDINGS: Lung volumes are normal. No consolidative scotch that opacity at the left base which may reflect atelectasis and/or consolidation, along with a superimposed small left pleural effusion. Right lung is clear. No right pleural effusion. Cephalization of the pulmonary vasculature, without frank pulmonary edema. Mild cardiomegaly. Upper mediastinal contours are within normal limits. Atherosclerotic calcifications are noted in the thoracic aorta. IMPRESSION: 1. Opacity in the left lower lobe which may reflect atelectasis and/or consolidation, with superimposed small left pleural effusion. 2. Cardiomegaly with pulmonary venous  congestion, but no frank pulmonary edema. Electronically Signed   By: Vinnie Langton M.D.   On: 01/05/2021 05:17    Cardiac Studies   Echo - pending  Echo 2019 Study Conclusions   - Left ventricle: The cavity size was mildly dilated. Wall    thickness was increased in a pattern of mild LVH. Systolic    function was moderately to severely reduced. The estimated    ejection fraction was in the range of 30% to 35%. There is    akinesis of the inferolateral and inferior myocardium. There is    hypokinesis of the anteroseptal and inferoseptal myocardium.   Impressions:   - Technically difficult; definity used; akinesis of the inferior    and inferolateral walls with overall moderate to severe LV    dysfunction; mild LVH and LVE.   Patient Profile     65 y.o. male  with hx of CAD s/p PCI to LAD, HFrEF, permanent atrial fibrillation on Xarelto, COPD, and essential hypertension who is being seen 01/05/2021 for the evaluation of NSTEMI.  Assessment & Plan    CAD NSTEMI - known CTO of RCA - prior BMS to LAD ?2010 - presenting with CP concerning for unstable angina - initial troponin 632 - holding entresto, lasix, digoxin in anticipation for heart cath this morning - plan for left heart cath this morning   Wide complex tachycardia - appears consistent with VT Known Afib - monomorphic - amiodarone gtt running - s/p DCCV in ER - concerning for ischemia - cath today - may need EP referral for consideration of ICD   Chronic anticoagulation - last dose of xarelto was yesterday morning - continue heparin   ICM/NICM Chronic systolic heart failure - EF 30-35% - repeat echo - maintained on entrseto, lasix   Permanent Afib - successfully cardioverted 04/2013 - unfortunately, returned to Afib and underwent 2 additiona DCCV that were unsuccessful - 05/16/14 and 06/07/14 - he has also failed tikosyn and amiodarone - now with rate control only - coreg, digoxin   AKI - sCr  1.34 - baseline appears to be 0.8-0.9 - will gently hydrate given CHF     For questions or updates, please contact [facility] Please consult [email or web address] for contact info under        Signed, Ledora Bottcher, PA  01/05/2021, 6:49 AM

## 2021-01-05 NOTE — Progress Notes (Signed)
Mobility Specialist Progress Note    01/05/21 1301  Mobility  Activity Refused mobility   Pt said he would rather rest today. Will f/u tomorrow.   [facility] Mobility Specialist  M.S. Primary Phone: [phone] M.S. Secondary Phone: [phone]

## 2021-01-05 NOTE — ED Notes (Signed)
Cardiology at bedside.

## 2021-01-05 NOTE — ED Provider Notes (Signed)
[facility] EMERGENCY DEPARTMENT Provider Note   CSN: [id number] Arrival date & time: 01/05/21  0444     History Chief Complaint  Patient presents with   Code STEMI    Norman Rodriguez is a 65 y.o. male.  LEvel 5 caveat for acute of condition.  Patient with a history of hypertension, CAD status post stents in 2010, atrial fibrillation on Xarelto nonsustained VT presenting with chest pain and dizziness ongoing for the past several hours.  Reports pressure across the center of his chest that radiates to his left arm associate with shortness of breath.  On EMS arrival patient was found to be in ventricular tachycardia around 200 and shocked x1 at 100 J.  Never did lose consciousness or do pulses.  Blood pressure was 74/30 prior to cardioversion.  Subsequent to cardioversion patient was atrial fibrillation with ST elevation inferiorly and ST depressions laterally.  This is gradually improving.  Patient still with central chest pressure but improving.  Denies any abdominal pain or back pain.  No cough or fever. Pain is similar to when he had his heart attack in the past and needed a stent. He states he never fell asleep last night but the pain onset when he turned to the left side in bed and denies any fall or trauma.  He is feeling better after getting cardioverted.  The history is provided by the patient and the EMS personnel. The history is limited by the condition of the patient.      Past Medical History:  Diagnosis Date   Arthritis    Atrial fibrillation ([place])    a. Dx 2010. b. Initiated on Tikosyn 03/2013.   CAD (coronary artery disease)    a. BMS 2.52mm 23 to LAD. CTO of RCA unsuccessful, good left to right collaterals - 2010.   Chronic anticoagulation    Chronic systolic CHF (congestive heart failure) ([place])    a. Both ischemic and tachy induced. b. Echo 04/2013: EF 30-35%, no RWMA, normal RV, mod dilated LA (before restoration of NSR).   COPD (chronic  obstructive pulmonary disease) ([place]) 10/22/2017   Dysrhythmia    hx of atrial fibrilation   HTN (hypertension)    Hyperlipidemia    NSVT (nonsustained ventricular tachycardia)    Obesity    Shortness of breath     Patient Active Problem List   Diagnosis Date Noted   Polyp of transverse colon    Polyp of descending colon    Polyp of rectum    Polyp of cecum    Melena    BRBPR (bright red blood per rectum) 10/22/2017   COPD (chronic obstructive pulmonary disease) ([place]) 10/22/2017   Cubital tunnel syndrome, left 02/21/2017   Dyspnea and respiratory abnormality 04/14/2016   PAF (paroxysmal atrial fibrillation) ([place]) 05/08/2014   Chest pain 43/88/8757   Chronic systolic heart failure ([place]) 03/29/2013   Cardiomyopathy ([place])    CAD (coronary artery disease)    Chronic anticoagulation    NSVT (nonsustained ventricular tachycardia) ([place])    Hyperlipidemia    Obesity    HTN (hypertension)    Atrial fibrillation ([place]) 11/03/2012    Past Surgical History:  Procedure Laterality Date   CARDIOVERSION N/A 04/27/2013   Procedure: BEDSIDE CARDIOVERSION;  Surgeon: Sanda Klein, MD;  Location: [place];  Service: Cardiovascular;  Laterality: N/A;   CARDIOVERSION N/A 05/15/2013   Procedure: CARDIOVERSION/BEDSIDE;  Surgeon: Larey Dresser, MD;  Location: [place];  Service: Cardiovascular;  Laterality: N/A;  CARDIOVERSION N/A 05/16/2014   Procedure: CARDIOVERSION;  Surgeon: Jerline Pain, MD;  Location: [place];  Service: Cardiovascular;  Laterality: N/A;   CARDIOVERSION N/A 06/07/2014   Procedure: CARDIOVERSION;  Surgeon: Jerline Pain, MD;  Location: [place];  Service: Cardiovascular;  Laterality: N/A;   COLONOSCOPY WITH PROPOFOL N/A 10/27/2017   Procedure: COLONOSCOPY WITH PROPOFOL;  Surgeon: Mauri Pole, MD;  Location: [facility];  Service: Endoscopy;  Laterality: N/A;   CORONARY ANGIOPLASTY WITH STENT PLACEMENT  2010   BMS 2.67mm 23 to LAD. CTO of RCA unsuccessful, good left  to right collaterals   ESOPHAGOGASTRODUODENOSCOPY (EGD) WITH PROPOFOL N/A 10/24/2017   Procedure: ESOPHAGOGASTRODUODENOSCOPY (EGD) WITH PROPOFOL;  Surgeon: Mauri Pole, MD;  Location: [facility];  Service: Endoscopy;  Laterality: N/A;   FOOT SURGERY Right    POLYPECTOMY  10/27/2017   Procedure: POLYPECTOMY;  Surgeon: Mauri Pole, MD;  Location: [facility];  Service: Endoscopy;;       Family History  Problem Relation Age of Onset   Other Father        Car accident   Coronary artery disease Neg Hx     Social History   Tobacco Use   Smoking status: Former    Types: Cigars   Smokeless tobacco: Never   Tobacco comments:    1 cigar per month states he stopped smoking  Vaping Use   Vaping Use: Never used  Substance Use Topics   Alcohol use: No   Drug use: No    Comment: 1 per month...pt states he stopped smoking    Home Medications Prior to Admission medications   Medication Sig Start Date End Date Taking? Authorizing Provider  gabapentin (NEURONTIN) 300 MG capsule Take 600 mg by mouth at bedtime. 09/25/20  Yes [provider]  ANORO ELLIPTA 62.5-25 MCG/INH AEPB INHALE ONE PUFFS INTO THE LUNGS DAILY Patient taking differently: Inhale 1 puff into the lungs daily. 05/17/18   Rigoberto Noel, MD  atorvastatin (LIPITOR) 40 MG tablet Take 1 tablet (40 mg total) by mouth daily. 12/29/20   Jerline Pain, MD  B Complex-C (B-COMPLEX WITH VITAMIN C) tablet Take 1 tablet by mouth daily.    [provider]  carvedilol (COREG) 12.5 MG tablet TAKE ONE TABLET BY MOUTH TWICE A DAY WITH MEALS Patient taking differently: Take 12.5 mg by mouth 2 (two) times daily with a meal. 11/17/20   Jerline Pain, MD  Coenzyme Q10 (CO Q-10) 200 MG CAPS Take 200 mg by mouth daily.    [provider]  digoxin (LANOXIN) 0.125 MG tablet TAKE ONE TABLET BY MOUTH DAILY Patient taking differently: Take 0.125 mg by mouth daily. 08/08/20   Jerline Pain, MD  ENTRESTO 49-51 MG  TAKE ONE TABLET BY MOUTH TWICE A DAY Patient taking differently: Take 1 tablet by mouth 2 (two) times daily. 11/17/20   Jerline Pain, MD  fexofenadine (ALLEGRA) 180 MG tablet Take 180 mg by mouth daily.    [provider]  furosemide (LASIX) 40 MG tablet TAKE ONE TABLET BY MOUTH DAILY Patient taking differently: Take 40 mg by mouth daily. 10/29/20   Jerline Pain, MD  LevOCARNitine (CARNITINE PO) Take 500 mg by mouth daily.    [provider]  Magnesium 400 MG CAPS Take 400 mg by mouth daily.    [provider]  meloxicam (MOBIC) 15 MG tablet Take 15 mg by mouth daily.    [provider]  Multiple Vitamin (MULTIVITAMIN)  tablet Take 1 tablet by mouth daily.    [provider]  nitroGLYCERIN (NITROSTAT) 0.4 MG SL tablet Place 1 tablet (0.4 mg total) under the tongue every 5 (five) minutes as needed for chest pain. 08/23/19   Jerline Pain, MD  NON FORMULARY Take 1 tablet by mouth daily. Glycine vitamin 1 tab po qd    [provider]  NON FORMULARY Take 465 mg by mouth as needed (joint pain). [place]    [provider]  Omega-3 Fatty Acids (OMEGA 3 PO) Take 2 capsules by mouth daily.     [provider]  Red Yeast Rice 600 MG CAPS Take 2 capsules (1,200 mg total) by mouth 2 (two) times daily. 09/27/18   Burtis Junes, NP  Red Yeast Rice POWD by Does not apply route.    [provider]  rivaroxaban (XARELTO) 20 MG TABS tablet TAKE ONE TABLET BY MOUTH DAILY WITH SUPPER Patient taking differently: Take 20 mg by mouth daily with supper. 11/18/20   Jerline Pain, MD  vitamin C (ASCORBIC ACID) 500 MG tablet Take 500 mg by mouth daily.    [provider]  VITAMIN D, CHOLECALCIFEROL, PO Take 2 tablets by mouth daily.    [provider]    Allergies    Apple, Soybean oil, Lisinopril, Other, Bean pod extract, Carrot [daucus carota], Grass extracts [gramineae pollens], Peach flavor, and Tree  extract  Review of Systems   Review of Systems  Constitutional:  Negative for activity change and appetite change.  HENT:  Negative for congestion and rhinorrhea.   Respiratory:  Positive for chest tightness and shortness of breath.   Cardiovascular:  Negative for chest pain.  Gastrointestinal:  Negative for abdominal pain, nausea and vomiting.  Genitourinary:  Negative for dysuria and hematuria.  Musculoskeletal:  Negative for arthralgias and myalgias.  Neurological:  Positive for dizziness and light-headedness. Negative for headaches.   all other systems are negative except as noted in the HPI and PMH.   Physical Exam Updated Vital Signs BP 101/71   Pulse 84   Temp (!) 96.8 F (36 C) (Temporal)   Resp 19   Ht 5\' 8"  (1.727 m)   Wt 117.9 kg   SpO2 97%   BMI 39.53 kg/m   Physical Exam Vitals and nursing note reviewed.  Constitutional:      General: He is in acute distress.     Appearance: He is well-developed. He is obese.     Comments:   Pale appearing.  HENT:     Head: Normocephalic and atraumatic.     Mouth/Throat:     Pharynx: No oropharyngeal exudate.  Eyes:     Conjunctiva/sclera: Conjunctivae normal.     Pupils: Pupils are equal, round, and reactive to light.  Neck:     Comments: No meningismus. Cardiovascular:     Rate and Rhythm: Normal rate and regular rhythm.     Heart sounds: Normal heart sounds. No murmur heard. Pulmonary:     Effort: Pulmonary effort is normal. No respiratory distress.     Breath sounds: Normal breath sounds.  Abdominal:     Palpations: Abdomen is soft.     Tenderness: There is no abdominal tenderness. There is no guarding or rebound.     Comments: Reducible ventral hernia.   Musculoskeletal:        General: No tenderness. Normal range of motion.     Cervical back: Normal range of motion and neck supple.  Skin:    General: Skin is warm.  Neurological:     Mental Status: He is alert and oriented to person, place, and time.      Cranial Nerves: No cranial nerve deficit.     Motor: No abnormal muscle tone.     Coordination: Coordination normal.     Comments:  5/5 strength throughout. CN 2-12 intact.Equal grip strength.   Psychiatric:        Behavior: Behavior normal.    ED Results / Procedures / Treatments   Labs (all labs ordered are listed, but only abnormal results are displayed) Labs Reviewed  HEMOGLOBIN A1C - Abnormal; Notable for the following components:      Result Value   Hgb A1c MFr Bld 6.4 (*)    All other components within normal limits  CBC WITH DIFFERENTIAL/PLATELET - Abnormal; Notable for the following components:   WBC 10.8 (*)    Neutro Abs 8.3 (*)    Monocytes Absolute 1.1 (*)    All other components within normal limits  PROTIME-INR - Abnormal; Notable for the following components:   Prothrombin Time 20.9 (*)    INR 1.8 (*)    All other components within normal limits  COMPREHENSIVE METABOLIC PANEL - Abnormal; Notable for the following components:   Glucose, Bld 166 (*)    Creatinine, Ser 1.34 (*)    Calcium 8.6 (*)    Albumin 3.4 (*)    AST 120 (*)    ALT 95 (*)    Total Bilirubin 1.6 (*)    GFR, Estimated 58 (*)    All other components within normal limits  DIGOXIN LEVEL - Abnormal; Notable for the following components:   Digoxin Level 0.3 (*)    All other components within normal limits  TROPONIN I (HIGH SENSITIVITY) - Abnormal; Notable for the following components:   Troponin I (High Sensitivity) 632 (*)    All other components within normal limits  RESP PANEL BY RT-PCR (FLU A&B, COVID) ARPGX2  APTT  LIPID PANEL  HIV ANTIBODY (ROUTINE TESTING W REFLEX)  TSH  T4, FREE  BRAIN NATRIURETIC PEPTIDE  APTT  HEPARIN LEVEL (UNFRACTIONATED)  TROPONIN I (HIGH SENSITIVITY)    EKG EKG Interpretation  Date/Time:  Monday January 05 2021 04:51:37 EST Ventricular Rate:  88 PR Interval:    QRS Duration: 92 QT Interval:  415 QTC Calculation: 503 R Axis:   59 Text  Interpretation: Atrial fibrillation Ventricular premature complex Low voltage, extremity leads Nonspecific repol abnormality, diffuse leads Prolonged QT interval Baseline wander in lead(s) V2 lateral ST depression inferior ST elevation Confirmed by Ezequiel Essex [phone]) on 01/05/2021 5:02:09 AM  Radiology DG Chest Port 1 View  Result Date: 01/05/2021 CLINICAL DATA:  65 year old male with history of chest pain. EXAM: PORTABLE CHEST 1 VIEW COMPARISON:  Chest x-ray 10/25/2017. FINDINGS: Lung volumes are normal. No consolidative scotch that opacity at the left base which may reflect atelectasis and/or consolidation, along with a superimposed small left pleural effusion. Right lung is clear. No right pleural effusion. Cephalization of the pulmonary vasculature, without frank pulmonary edema. Mild cardiomegaly. Upper mediastinal contours are within normal limits. Atherosclerotic calcifications are noted in the thoracic aorta. IMPRESSION: 1. Opacity in the left lower lobe which may reflect atelectasis and/or consolidation, with superimposed small left pleural effusion. 2. Cardiomegaly with pulmonary venous congestion, but no frank pulmonary edema. Electronically Signed   By: Vinnie Langton M.D.   On: 01/05/2021 05:17    Procedures .Critical Care Performed by: Carnelius Hammitt,  Annie Main, MD Authorized by: Ezequiel Essex, MD   Critical care provider statement:    Critical care time (minutes):  35   Critical care time was exclusive of:  Separately billable procedures and treating other patients   Critical care was necessary to treat or prevent imminent or life-threatening deterioration of the following conditions: ACS, ventricular tachycardia.   Critical care was time spent personally by me on the following activities:  Development of treatment plan with patient or surrogate, discussions with consultants, evaluation of patient's response to treatment, examination of patient, ordering and review of laboratory  studies, ordering and review of radiographic studies, ordering and performing treatments and interventions, pulse oximetry, re-evaluation of patient's condition and review of old charts   Care discussed with: admitting provider     Medications Ordered in ED Medications  heparin injection 4,000 Units (4,000 Units Intravenous Not Given 01/05/21 0501)  0.9 %  sodium chloride infusion ( Intravenous New Bag/Given 01/05/21 0506)  amiodarone (NEXTERONE) 1.8 mg/mL load via infusion 150 mg (150 mg Intravenous Bolus from Bag 01/05/21 0529)    Followed by  amiodarone (NEXTERONE PREMIX) 360-4.14 MG/200ML-% (1.8 mg/mL) IV infusion (60 mg/hr Intravenous New Bag/Given 01/05/21 0528)    Followed by  amiodarone (NEXTERONE PREMIX) 360-4.14 MG/200ML-% (1.8 mg/mL) IV infusion (has no administration in time range)  aspirin chewable tablet 324 mg (324 mg Oral Given 01/05/21 0459)  heparin 5000 UNIT/ML injection (4,000 Units  Given 01/05/21 0459)       ED Course  I have reviewed the triage vital signs and the nursing notes.  Pertinent labs & imaging results that were available during my care of the patient were reviewed by me and considered in my medical decision making (see chart for details).    MDM Rules/Calculators/A&P                          Patient here by EMS after episode of ventricular tachycardia status post cardioversion.  On arrival he is awake and alert with a soft blood pressure in the 90s.  Still having 3/10 chest pain.  EKG here shows atrial fibrillation without acute ST elevation. Subtle ST depression laterally with elevation in lead III.  However EMS EKGs are concerning for inferior elevation with lateral depression and elevation in aVR.  Code STEMI was activated based on this. Patient given aspirin as well as heparin bolus.  Discussed with cardiology at bedside Dr. Clayton Bibles with Dr. [place] by phone.  They feel patient's EKG changes are improving do not meet STEMI criteria at this time  though his story is concerning initial EMS EKG was concerning. Initiate heparin and as well as amiodarone drip.  Last dose of Xarelto was yesterday morning  Chest pain is improving.  Continue heparin and amiodarone per cardiology Dr. Clayton Bibles.  Low suspicion for aortic dissection or pulmonary embolism.  Cardiology planning left heart catheterization later this morning.  Troponin 632. Blood pressure has remained stable throughout ED course.  Patient stable for admission to CCU and subsequent Cath Lab Final Clinical Impression(s) / ED Diagnoses Final diagnoses:  None    Rx / DC Orders ED Discharge Orders     None        Miyah Hampshire, Annie Main, MD 01/05/21 [phone]

## 2021-01-06 ENCOUNTER — Other Ambulatory Visit (HOSPITAL_COMMUNITY): Payer: Self-pay

## 2021-01-06 ENCOUNTER — Encounter (HOSPITAL_COMMUNITY)
Admission: EM | Disposition: A | Discharge status: Home / Self Care | Payer: Self-pay | Source: Home / Self Care | Attending: Cardiovascular Disease

## 2021-01-06 ENCOUNTER — Inpatient Hospital Stay (HOSPITAL_COMMUNITY): Payer: Medicare Other

## 2021-01-06 DIAGNOSIS — I249 Acute ischemic heart disease, unspecified: Secondary | ICD-10-CM

## 2021-01-06 DIAGNOSIS — I255 Ischemic cardiomyopathy: Secondary | ICD-10-CM

## 2021-01-06 DIAGNOSIS — I472 Ventricular tachycardia, unspecified: Secondary | ICD-10-CM

## 2021-01-06 DIAGNOSIS — I214 Non-ST elevation (NSTEMI) myocardial infarction: Secondary | ICD-10-CM

## 2021-01-06 HISTORY — PX: ICD IMPLANT: EP1208

## 2021-01-06 LAB — ECHOCARDIOGRAM COMPLETE
Calc EF: 29.7 %
Height: 68 in
MV M vel: 4.77 m/s
MV Peak grad: 91 mmHg
Radius: 0.3 cm
S' Lateral: 5.73 cm
Single Plane A2C EF: 28.8 %
Single Plane A4C EF: 29.6 %
Weight: 4160 oz

## 2021-01-06 LAB — CBC
HCT: 42.7 % (ref 39.0–52.0)
Hemoglobin: 13.9 g/dL (ref 13.0–17.0)
MCH: 31.2 pg (ref 26.0–34.0)
MCHC: 32.6 g/dL (ref 30.0–36.0)
MCV: 95.7 fL (ref 80.0–100.0)
Platelets: 148 10*3/uL — ABNORMAL LOW (ref 150–400)
RBC: 4.46 MIL/uL (ref 4.22–5.81)
RDW: 13.5 % (ref 11.5–15.5)
WBC: 7.8 10*3/uL (ref 4.0–10.5)
nRBC: 0 % (ref 0.0–0.2)

## 2021-01-06 LAB — BASIC METABOLIC PANEL
Anion gap: 9 (ref 5–15)
BUN: 19 mg/dL (ref 8–23)
CO2: 24 mmol/L (ref 22–32)
Calcium: 8.9 mg/dL (ref 8.9–10.3)
Chloride: 101 mmol/L (ref 98–111)
Creatinine, Ser: 1.16 mg/dL (ref 0.61–1.24)
GFR, Estimated: >60 mL/min (ref >60)
Glucose, Bld: 144 mg/dL — ABNORMAL HIGH (ref 70–99)
Potassium: 4 mmol/L (ref 3.5–5.1)
Sodium: 134 mmol/L — ABNORMAL LOW (ref 135–145)

## 2021-01-06 LAB — GLUCOSE, CAPILLARY
Glucose-Capillary: 144 mg/dL — ABNORMAL HIGH (ref 70–99)
Glucose-Capillary: 139 mg/dL — ABNORMAL HIGH (ref 70–99)

## 2021-01-06 LAB — APTT: aPTT: 29 seconds (ref 24–36)

## 2021-01-06 SURGERY — ICD IMPLANT

## 2021-01-06 MED ORDER — LIDOCAINE HCL (PF) 1 % IJ SOLN
INTRAMUSCULAR | Status: AC
  Filled 2021-01-06: qty 30

## 2021-01-06 MED ORDER — LIDOCAINE HCL (PF) 1 % IJ SOLN
INTRAMUSCULAR | Status: AC
  Filled 2021-01-06: qty 60

## 2021-01-06 MED ORDER — FUROSEMIDE 10 MG/ML IJ SOLN
INTRAMUSCULAR | Status: AC
  Filled 2021-01-06: qty 4

## 2021-01-06 MED ORDER — SODIUM CHLORIDE 0.9 % IV SOLN
INTRAVENOUS | Status: AC
  Filled 2021-01-06: qty 2

## 2021-01-06 MED ORDER — CEFAZOLIN SODIUM-DEXTROSE 1-4 GM/50ML-% IV SOLN
1.0000 g | Freq: Four times a day (QID) | INTRAVENOUS | Status: AC
  Administered 2021-01-06 – 2021-01-07 (×3): 1 g via INTRAVENOUS
  Filled 2021-01-06 (×4): qty 50

## 2021-01-06 MED ORDER — CHLORHEXIDINE GLUCONATE 4 % EX LIQD
60.0000 mL | Freq: Once | CUTANEOUS | Status: AC
  Administered 2021-01-06: 4 via TOPICAL
  Filled 2021-01-06: qty 60

## 2021-01-06 MED ORDER — ISOSORBIDE MONONITRATE ER 30 MG PO TB24
15.0000 mg | ORAL_TABLET | Freq: Every day | ORAL | Status: DC
  Administered 2021-01-07: 10:00:00 15 mg via ORAL
  Filled 2021-01-06 (×2): qty 1

## 2021-01-06 MED ORDER — PERFLUTREN LIPID MICROSPHERE
1.0000 mL | INTRAVENOUS | Status: AC | PRN
Start: 2021-01-06 — End: 2021-01-06
  Administered 2021-01-06: 2 mL via INTRAVENOUS
  Filled 2021-01-06: qty 10

## 2021-01-06 MED ORDER — DAPAGLIFLOZIN PROPANEDIOL 10 MG PO TABS
10.0000 mg | ORAL_TABLET | Freq: Every day | ORAL | Status: DC
  Administered 2021-01-06 – 2021-01-07 (×2): 10 mg via ORAL
  Filled 2021-01-06 (×2): qty 1

## 2021-01-06 MED ORDER — LIDOCAINE HCL (PF) 1 % IJ SOLN
INTRAMUSCULAR | Status: DC | PRN
  Administered 2021-01-06: 30 mL
  Administered 2021-01-06: 40 mL

## 2021-01-06 MED ORDER — CEFAZOLIN SODIUM-DEXTROSE 2-4 GM/100ML-% IV SOLN
INTRAVENOUS | Status: AC
  Filled 2021-01-06: qty 100

## 2021-01-06 MED ORDER — FUROSEMIDE 10 MG/ML IJ SOLN
INTRAMUSCULAR | Status: DC | PRN
  Administered 2021-01-06: 40 mg via INTRAVENOUS

## 2021-01-06 SURGICAL SUPPLY — 8 items
CABLE SURGICAL S-101-97-12 (CABLE) ×4 IMPLANT
ICD GALLANT VR CDVRA500Q (ICD Generator) ×2 IMPLANT
LEAD DURATA 7122Q-65CM (Lead) ×2 IMPLANT
PAD DEFIB RADIO PHYSIO CONN (PAD) ×4 IMPLANT
SHEATH 7FR PRELUDE SNAP 13 (SHEATH) ×2 IMPLANT
SHEATH CLASSIC 8F 25CM (SHEATH) ×2 IMPLANT
TRAY PACEMAKER INSERTION (PACKS) ×4 IMPLANT
WIRE HI TORQ VERSACORE-J 145CM (WIRE) ×2 IMPLANT

## 2021-01-06 NOTE — TOC Benefit Eligibility Note (Signed)
Patient Advocate Encounter ° °Insurance verification completed.   ° °The patient is currently admitted and upon discharge could be taking Farxiga 10 mg. ° °The current 30 day co-pay is, $0.00.  ° °The patient is currently admitted and upon discharge could be taking Jardiance 10 mg. ° °The current 30 day co-pay is, $0.00.  ° °The patient is insured through [employer] Medicare Part D  ° ° °Dorla Guizar, CPhT °Pharmacy Patient Advocate Specialist °[facility] Patient Advocate Team °Direct Number: [phone]  Fax: [phone] ° ° ° ° ° °  °

## 2021-01-06 NOTE — H&P (View-Only) (Signed)
Electrophysiology Rounding Note  Patient Name: Norman Rodriguez Date of Encounter: 01/06/2021  Primary Cardiologist: [name], MD Electrophysiologist: Cristopher Peru, MD   Subjective   The patient is doing well today.  At this time, the patient denies chest pain or any new concerns.  Breathing somewhat labored this am but not up and about yet.   Inpatient Medications    Scheduled Meds:  aspirin  81 mg Oral Daily   atorvastatin  80 mg Oral Daily   carvedilol  12.5 mg Oral BID WC   chlorhexidine  60 mL Topical Once   gentamicin irrigation  80 mg Irrigation On Call   sodium chloride flush  3 mL Intravenous Q12H   sodium chloride flush  3 mL Intravenous Q12H   Continuous Infusions:  sodium chloride 10 mL/hr at 01/05/21 0506   sodium chloride     sodium chloride     sodium chloride     sodium chloride     sodium chloride     sodium chloride 50 mL/hr at 01/06/21 0419   amiodarone 30 mg/hr (01/06/21 0453)    ceFAZolin (ANCEF) IV     PRN Meds: sodium chloride, sodium chloride, acetaminophen, morphine injection, nitroGLYCERIN, ondansetron (ZOFRAN) IV, sodium chloride flush, sodium chloride flush   Vital Signs    Vitals:   01/05/21 1800 01/05/21 1948 01/06/21 0006 01/06/21 0456  BP: 133/84 115/63 (!) 145/80 127/83  Pulse: 75 77  81  Resp:    18  Temp:  98.1 F (36.7 C) 97.9 F (36.6 C) 98.6 F (37 C)  TempSrc:  Oral Oral Oral  SpO2:  97% 97% 95%  Weight:      Height:        Intake/Output Summary (Last 24 hours) at 01/06/2021 0727 Last data filed at 01/06/2021 0501 Gross per 24 hour  Intake 810.48 ml  Output 200 ml  Net 610.48 ml   Filed Weights   01/05/21 0451  Weight: 117.9 kg    Physical Exam    GEN- The patient is well appearing, alert and oriented x 3 today.   Head- normocephalic, atraumatic Eyes-  Sclera clear, conjunctiva pink Ears- hearing intact Oropharynx- clear Neck- supple Lungs- Clear to ausculation bilaterally, normal work of  breathing Heart- Irregularly irregular rate and rhythm, no murmurs, rubs or gallops GI- soft, NT, ND, + BS Extremities- no clubbing or cyanosis. No edema Skin- no rash or lesion Psych- euthymic mood, full affect Neuro- strength and sensation are intact  Labs    CBC Recent Labs    01/05/21 0456 01/06/21 0412  WBC 10.8* 7.8  NEUTROABS 8.3*  --   HGB 15.9 13.9  HCT 48.2 42.7  MCV 97.8 95.7  PLT 171 378*   Basic Metabolic Panel Recent Labs    01/05/21 0456 01/06/21 0412  NA 137 134*  K 4.6 4.0  CL 102 101  CO2 22 24  GLUCOSE 166* 144*  BUN 13 19  CREATININE 1.34* 1.16  CALCIUM 8.6* 8.9   Liver Function Tests Recent Labs    01/05/21 0456  AST 120*  ALT 95*  ALKPHOS 53  BILITOT 1.6*  PROT 6.6  ALBUMIN 3.4*   No results for input(s): LIPASE, AMYLASE in the last 72 hours. Cardiac Enzymes No results for input(s): CKTOTAL, CKMB, CKMBINDEX, TROPONINI in the last 72 hours.   Telemetry    Rate controlled AF 70-80s (personally reviewed)  Radiology    CARDIAC CATHETERIZATION  Result Date: 01/05/2021 Images from the original result  were not included.   Prox Cx to Mid Cx lesion is 70% stenosed.   Prox RCA to Mid RCA lesion is 90% stenosed.   Mid RCA lesion is 100% stenosed.   Previously placed Prox LAD to Mid LAD stent (unknown type) is  widely patent.   There is severe left ventricular systolic dysfunction.   LV end diastolic pressure is mildly elevated.   The left ventricular ejection fraction is 25-35% by visual estimate. Norman Rodriguez is a 65 y.o. male  [id number] LOCATION:  FACILITY: [facility] PHYSICIAN: Quay Burow, M.D. [birth year] DATE OF PROCEDURE:  01/05/2021 DATE OF DISCHARGE: CARDIAC CATHETERIZATION History obtained from chart review.65 y.o. male  with hx of CAD s/p PCI to LAD, HFrEF, permanent atrial fibrillation on Xarelto, COPD, and essential hypertension who is being seen 01/05/2021 for the evaluation of NSTEMI.  He presented with some chest pain and  monomorphic sustained ventricular tachycardia requiring defibrillation.  Her troponins were elevated at 600. PROCEDURE DESCRIPTION: The patient was brought to the second floor [facility] cath lab in the postabsorptive state. He was not premedicated. His right wrist was prepped and shaved in usual sterile fashion. Xylocaine 1% was used for local anesthesia. A 6 French sheath was inserted into the right radial artery using standard Seldinger technique.  Ultrasound was used to guide access.  Digital image was captured and placed the patient's chart.  The patient received 6000 units  of heparin intravenously.  A 5 [place] TIG catheter and pigtail catheter were used for selective coronary angiography and left ventriculography respectively.  Isovue dye was used for the entire to the case (60 cc of contrast total to patient).  Retrograde aortic and left ventricular and pullback pressures were recorded.  Radial cocktail was administered via the SideArm sheath.   Mr. [name] had a patent proximal LAD stent placed by Dr. Irish Lack 11/07/2008.  He has an RCA CTO which is old as well with left-to-right collaterals.  The circumflex is small and diffusely diseased.  His LVEDP is mildly elevated and his EF is severely reduced.  I suspect this was a primary arrhythmogenic event.  He Spence Soberano need 2D echocardiography, and EP consult for ICD implantation and probably an antiarrhythmic medication along with guideline directed optimal medical therapy for his ischemic cardiomyopathy.  Heparin Norman Rodriguez be restarted 4 hours after sheath removal and Xarelto Hesston Hitchens be restarted once timing of ICD implantation is formalized.  The sheath was removed and a TR band was placed on the right wrist to achieve patent hemostasis.  The patient left lab in stable condition.  Dr. Pernell Dupre, the attending cardiologist, was notified of these results. Quay Burow. MD, [facility] 01/05/2021 8:49 AM    DG Chest Port 1 View  Result Date: 01/05/2021 CLINICAL  DATA:  65 year old male with history of chest pain. EXAM: PORTABLE CHEST 1 VIEW COMPARISON:  Chest x-ray 10/25/2017. FINDINGS: Lung volumes are normal. No consolidative scotch that opacity at the left base which may reflect atelectasis and/or consolidation, along with a superimposed small left pleural effusion. Right lung is clear. No right pleural effusion. Cephalization of the pulmonary vasculature, without frank pulmonary edema. Mild cardiomegaly. Upper mediastinal contours are within normal limits. Atherosclerotic calcifications are noted in the thoracic aorta. IMPRESSION: 1. Opacity in the left lower lobe which may reflect atelectasis and/or consolidation, with superimposed small left pleural effusion. 2. Cardiomegaly with pulmonary venous congestion, but no frank pulmonary edema. Electronically Signed   By: Vinnie Langton M.D.   On: 01/05/2021 05:17  Patient Profile     AVIS MCMAHILL is a 65 y.o. male with a history of tachy induced CM, CAD s/p PCI to LAD, COP, HTN, and permanent atrial fibrillation having failed amiodarone and tikosyn.  who is being seen today for the evaluation of VT at the request of Dr. Tamala Julian.  Assessment & Plan    1.  Monomorphic VT Rhythm strip as above. Quiescent thus far on IV amiodarone  Explained risks, benefits, and alternatives to ICD implantation, including but not limited to bleeding, infection, pneumothorax, pericardial effusion, lead dislodgement, heart attack, stroke, or death.  Pt verbalized understanding and agrees to proceed today   2. NSTEMI/ CAD Cath 12/12 with stable disease and no target lesion Maximize medical therapy   3. Chronic systolic CHF Echo 1115 LVEF 30-35% Repeat pending today, but indicated for ICD with narrow QRS either way.  GDMT including ARNi and BB PTA.    4. Permanent atrial fibrillation Rate controlled Xarelto on hold for procedures Resumption pending procedures Digoxin level on admit.   Plan for ICD today pending  lab availability.   For questions or updates, please contact [place] Please consult [email or web address] for contact info under Cardiology/STEMI.  Signed, Shirley Friar, PA-C  01/06/2021, 7:27 AM   I have seen and examined this patient with Oda Kilts.  Agree with above, note added to reflect my findings.  Echo pending today.  He is currently well, though mildly short of breath.  Shortness of breath has occurred with walking.  He is able to lay flat without issue.  GEN: Well nourished, well developed, in no acute distress  HEENT: normal  Neck: no JVD, carotid bruits, or masses Cardiac: irregular; no murmurs, rubs, or gallops,no edema  Respiratory:  clear to auscultation bilaterally, normal work of breathing GI: soft, nontender, nondistended, + BS MS: no deformity or atrophy  Skin: warm and dry Neuro:  Strength and sensation are intact Psych: euthymic mood, full affect   1.  Monomorphic ventricular tachycardia: Has done well on amiodarone without further VT.  We Majesti Gambrell plan for ICD today.  Risks and benefits have been discussed as above.  Patient agrees to the procedure.  2.  Permanent atrial fibrillation: Xarelto on hold for procedures.  3.  Chronic systolic heart failure: Ejection fraction 30 to 35% in 2019.  Echo pending.  We Johnita Palleschi continue goal-directed medical therapy.  Arjen Deringer M. Yurani Fettes MD 01/06/2021 7:38 AM

## 2021-01-06 NOTE — Progress Notes (Signed)
°  Echocardiogram 2D Echocardiogram has been performed.  Norman Rodriguez 01/06/2021, 11:58 AM

## 2021-01-06 NOTE — Interval H&P Note (Signed)
History and Physical Interval Note:  01/06/2021 7:40 AM  Norman Rodriguez  has presented today for surgery, with the diagnosis of syncope.  The various methods of treatment have been discussed with the patient and family. After consideration of risks, benefits and other options for treatment, the patient has consented to  Procedure(s): ICD IMPLANT (N/A) as a surgical intervention.  The patient's history has been reviewed, patient examined, no change in status, stable for surgery.  I have reviewed the patient's chart and labs.  Questions were answered to the patient's satisfaction.     Norman Rodriguez Norman Rodriguez  ICD Criteria  Current LVEF:33%. Within 12 months prior to implant: No   Heart failure history: Yes, Class II  Cardiomyopathy history: Yes, Ischemic Cardiomyopathy - Prior MI.  Atrial Fibrillation/Atrial Flutter: Yes, Permanent.  Ventricular tachycardia history: Yes, No hemodynamic instability. VT Type: Sustained Ventricular Tachycardia - Monomorphic.  Cardiac arrest history: No.  History of syndromes with risk of sudden death: No.  Previous ICD: No.  Current ICD indication: Secondary  PPM indication: No.  Class I or II Bradycardia indication present: No  Beta Blocker therapy for 3 or more months: Yes, prescribed.   Ace Inhibitor/ARB therapy for 3 or more months: Yes, prescribed.    I have seen Norman Rodriguez is a 65 y.o. malepre-procedural and has been referred by Lovena Le for consideration of ICD implant for secondary  prevention of sudden death.  The patient's chart has been reviewed and they meet criteria for ICD implant.  I have had a thorough discussion with the patient reviewing options.  The patient and their family (if available) have had opportunities to ask questions and have them answered. The patient and I have decided together through the [employer] to implant ICD at this time.  Risks, benefits, alternatives to ICD implantation  were discussed in detail with the patient today. The patient  understands that the risks include but are not limited to bleeding, infection, pneumothorax, perforation, tamponade, vascular damage, renal failure, MI, stroke, death, inappropriate shocks, and lead dislodgement and wishes to proceed.

## 2021-01-06 NOTE — Progress Notes (Addendum)
Electrophysiology Rounding Note  Patient Name: Norman Rodriguez Date of Encounter: 01/06/2021  Primary Cardiologist: [name], MD Electrophysiologist: Cristopher Peru, MD   Subjective   The patient is doing well today.  At this time, the patient denies chest pain or any new concerns.  Breathing somewhat labored this am but not up and about yet.   Inpatient Medications    Scheduled Meds:  aspirin  81 mg Oral Daily   atorvastatin  80 mg Oral Daily   carvedilol  12.5 mg Oral BID WC   chlorhexidine  60 mL Topical Once   gentamicin irrigation  80 mg Irrigation On Call   sodium chloride flush  3 mL Intravenous Q12H   sodium chloride flush  3 mL Intravenous Q12H   Continuous Infusions:  sodium chloride 10 mL/hr at 01/05/21 0506   sodium chloride     sodium chloride     sodium chloride     sodium chloride     sodium chloride     sodium chloride 50 mL/hr at 01/06/21 0419   amiodarone 30 mg/hr (01/06/21 0453)    ceFAZolin (ANCEF) IV     PRN Meds: sodium chloride, sodium chloride, acetaminophen, morphine injection, nitroGLYCERIN, ondansetron (ZOFRAN) IV, sodium chloride flush, sodium chloride flush   Vital Signs    Vitals:   01/05/21 1800 01/05/21 1948 01/06/21 0006 01/06/21 0456  BP: 133/84 115/63 (!) 145/80 127/83  Pulse: 75 77  81  Resp:    18  Temp:  98.1 F (36.7 C) 97.9 F (36.6 C) 98.6 F (37 C)  TempSrc:  Oral Oral Oral  SpO2:  97% 97% 95%  Weight:      Height:        Intake/Output Summary (Last 24 hours) at 01/06/2021 0727 Last data filed at 01/06/2021 0501 Gross per 24 hour  Intake 810.48 ml  Output 200 ml  Net 610.48 ml   Filed Weights   01/05/21 0451  Weight: 117.9 kg    Physical Exam    GEN- The patient is well appearing, alert and oriented x 3 today.   Head- normocephalic, atraumatic Eyes-  Sclera clear, conjunctiva pink Ears- hearing intact Oropharynx- clear Neck- supple Lungs- Clear to ausculation bilaterally, normal work of  breathing Heart- Irregularly irregular rate and rhythm, no murmurs, rubs or gallops GI- soft, NT, ND, + BS Extremities- no clubbing or cyanosis. No edema Skin- no rash or lesion Psych- euthymic mood, full affect Neuro- strength and sensation are intact  Labs    CBC Recent Labs    01/05/21 0456 01/06/21 0412  WBC 10.8* 7.8  NEUTROABS 8.3*  --   HGB 15.9 13.9  HCT 48.2 42.7  MCV 97.8 95.7  PLT 171 629*   Basic Metabolic Panel Recent Labs    01/05/21 0456 01/06/21 0412  NA 137 134*  K 4.6 4.0  CL 102 101  CO2 22 24  GLUCOSE 166* 144*  BUN 13 19  CREATININE 1.34* 1.16  CALCIUM 8.6* 8.9   Liver Function Tests Recent Labs    01/05/21 0456  AST 120*  ALT 95*  ALKPHOS 53  BILITOT 1.6*  PROT 6.6  ALBUMIN 3.4*   No results for input(s): LIPASE, AMYLASE in the last 72 hours. Cardiac Enzymes No results for input(s): CKTOTAL, CKMB, CKMBINDEX, TROPONINI in the last 72 hours.   Telemetry    Rate controlled AF 70-80s (personally reviewed)  Radiology    CARDIAC CATHETERIZATION  Result Date: 01/05/2021 Images from the original result  were not included.   Prox Cx to Mid Cx lesion is 70% stenosed.   Prox RCA to Mid RCA lesion is 90% stenosed.   Mid RCA lesion is 100% stenosed.   Previously placed Prox LAD to Mid LAD stent (unknown type) is  widely patent.   There is severe left ventricular systolic dysfunction.   LV end diastolic pressure is mildly elevated.   The left ventricular ejection fraction is 25-35% by visual estimate. Norman Rodriguez is a 65 y.o. male  [id number] LOCATION:  FACILITY: [facility] PHYSICIAN: Quay Burow, M.D. 07-03-1955 DATE OF PROCEDURE:  01/05/2021 DATE OF DISCHARGE: CARDIAC CATHETERIZATION History obtained from chart review.65 y.o. male  with hx of CAD s/p PCI to LAD, HFrEF, permanent atrial fibrillation on Xarelto, COPD, and essential hypertension who is being seen 01/05/2021 for the evaluation of NSTEMI.  He presented with some chest pain and  monomorphic sustained ventricular tachycardia requiring defibrillation.  Her troponins were elevated at 600. PROCEDURE DESCRIPTION: The patient was brought to the second floor [facility] cath lab in the postabsorptive state. He was not premedicated. His right wrist was prepped and shaved in usual sterile fashion. Xylocaine 1% was used for local anesthesia. A 6 French sheath was inserted into the right radial artery using standard Seldinger technique.  Ultrasound was used to guide access.  Digital image was captured and placed the patient's chart.  The patient received 6000 units  of heparin intravenously.  A 5 [place] TIG catheter and pigtail catheter were used for selective coronary angiography and left ventriculography respectively.  Isovue dye was used for the entire to the case (60 cc of contrast total to patient).  Retrograde aortic and left ventricular and pullback pressures were recorded.  Radial cocktail was administered via the SideArm sheath.   Norman Rodriguez had a patent proximal LAD stent placed by Dr. Irish Lack 11/07/2008.  He has an RCA CTO which is old as well with left-to-right collaterals.  The circumflex is small and diffusely diseased.  His LVEDP is mildly elevated and his EF is severely reduced.  I suspect this was a primary arrhythmogenic event.  He Cody Oliger need 2D echocardiography, and EP consult for ICD implantation and probably an antiarrhythmic medication along with guideline directed optimal medical therapy for his ischemic cardiomyopathy.  Heparin Theoden Mauch be restarted 4 hours after sheath removal and Xarelto Lunna Vogelgesang be restarted once timing of ICD implantation is formalized.  The sheath was removed and a TR band was placed on the right wrist to achieve patent hemostasis.  The patient left lab in stable condition.  Dr. Pernell Dupre, the attending cardiologist, was notified of these results. Quay Burow. MD, [facility] 01/05/2021 8:49 AM    DG Chest Port 1 View  Result Date: 01/05/2021 CLINICAL  DATA:  65 year old male with history of chest pain. EXAM: PORTABLE CHEST 1 VIEW COMPARISON:  Chest x-ray 10/25/2017. FINDINGS: Lung volumes are normal. No consolidative scotch that opacity at the left base which may reflect atelectasis and/or consolidation, along with a superimposed small left pleural effusion. Right lung is clear. No right pleural effusion. Cephalization of the pulmonary vasculature, without frank pulmonary edema. Mild cardiomegaly. Upper mediastinal contours are within normal limits. Atherosclerotic calcifications are noted in the thoracic aorta. IMPRESSION: 1. Opacity in the left lower lobe which may reflect atelectasis and/or consolidation, with superimposed small left pleural effusion. 2. Cardiomegaly with pulmonary venous congestion, but no frank pulmonary edema. Electronically Signed   By: Vinnie Langton M.D.   On: 01/05/2021 05:17  Patient Profile     Norman Rodriguez is a 65 y.o. male with a history of tachy induced CM, CAD s/p PCI to LAD, COP, HTN, and permanent atrial fibrillation having failed amiodarone and tikosyn.  who is being seen today for the evaluation of VT at the request of Dr. Tamala Julian.  Assessment & Plan    1.  Monomorphic VT Rhythm strip as above. Quiescent thus far on IV amiodarone  Explained risks, benefits, and alternatives to ICD implantation, including but not limited to bleeding, infection, pneumothorax, pericardial effusion, lead dislodgement, heart attack, stroke, or death.  Pt verbalized understanding and agrees to proceed today   2. NSTEMI/ CAD Cath 12/12 with stable disease and no target lesion Maximize medical therapy   3. Chronic systolic CHF Echo 8101 LVEF 30-35% Repeat pending today, but indicated for ICD with narrow QRS either way.  GDMT including ARNi and BB PTA.    4. Permanent atrial fibrillation Rate controlled Xarelto on hold for procedures Resumption pending procedures Digoxin level on admit.   Plan for ICD today pending  lab availability.   For questions or updates, please contact [place] Please consult [email or web address] for contact info under Cardiology/STEMI.  Signed, Shirley Friar, PA-C  01/06/2021, 7:27 AM   I have seen and examined this patient with Oda Kilts.  Agree with above, note added to reflect my findings.  Echo pending today.  He is currently well, though mildly short of breath.  Shortness of breath has occurred with walking.  He is able to lay flat without issue.  GEN: Well nourished, well developed, in no acute distress  HEENT: normal  Neck: no JVD, carotid bruits, or masses Cardiac: irregular; no murmurs, rubs, or gallops,no edema  Respiratory:  clear to auscultation bilaterally, normal work of breathing GI: soft, nontender, nondistended, + BS MS: no deformity or atrophy  Skin: warm and dry Neuro:  Strength and sensation are intact Psych: euthymic mood, full affect   1.  Monomorphic ventricular tachycardia: Has done well on amiodarone without further VT.  We Giulian Goldring plan for ICD today.  Risks and benefits have been discussed as above.  Patient agrees to the procedure.  2.  Permanent atrial fibrillation: Xarelto on hold for procedures.  3.  Chronic systolic heart failure: Ejection fraction 30 to 35% in 2019.  Echo pending.  We Bostyn Bogie continue goal-directed medical therapy.  Tiannah Greenly M. Angelita Harnack MD 01/06/2021 7:38 AM

## 2021-01-06 NOTE — Progress Notes (Signed)
Mobility Specialist Progress Note    01/06/21 1508  Mobility  Activity Contraindicated/medical hold   RN advised to hold off as pt is feeling SOB and waiting for procedure  [facility] Mobility Specialist  M.S. Primary Phone: [phone] M.S. Secondary Phone: [phone]

## 2021-01-06 NOTE — Progress Notes (Signed)
Progress Note  Patient Name: Norman Rodriguez Date of Encounter: 01/06/2021  [facility] Cardiologist: [name], MD   Subjective   Asleep.  Inpatient Medications    Scheduled Meds:  aspirin  81 mg Oral Daily   atorvastatin  80 mg Oral Daily   carvedilol  12.5 mg Oral BID WC   chlorhexidine  60 mL Topical Once   gentamicin irrigation  80 mg Irrigation On Call   sodium chloride flush  3 mL Intravenous Q12H   sodium chloride flush  3 mL Intravenous Q12H   Continuous Infusions:  sodium chloride 10 mL/hr at 01/05/21 0506   sodium chloride     sodium chloride     sodium chloride     sodium chloride     sodium chloride 1,000 mL (01/06/21 0902)   sodium chloride 50 mL/hr at 01/06/21 0419   amiodarone 30 mg/hr (01/06/21 0453)    ceFAZolin (ANCEF) IV     PRN Meds: sodium chloride, sodium chloride, acetaminophen, morphine injection, nitroGLYCERIN, ondansetron (ZOFRAN) IV, sodium chloride flush, sodium chloride flush   Vital Signs    Vitals:   01/06/21 0006 01/06/21 0456 01/06/21 0745 01/06/21 0900  BP: (!) 145/80 127/83 (!) 149/99   Pulse:  81 85 60  Resp:  18    Temp: 97.9 F (36.6 C) 98.6 F (37 C) 98.4 F (36.9 C)   TempSrc: Oral Oral    SpO2: 97% 95% 95%   Weight:      Height:        Intake/Output Summary (Last 24 hours) at 01/06/2021 0930 Last data filed at 01/06/2021 0501 Gross per 24 hour  Intake 810.48 ml  Output 200 ml  Net 610.48 ml   Last 3 Weights 01/05/2021 10/30/2020 06/24/2020  Weight (lbs) 260 lb 263 lb 266 lb  Weight (kg) 117.935 kg 119.296 kg 120.657 kg      Telemetry    Atrial fibrillation with rate control and PVCs on amiodarone.- Personally Reviewed  ECG    A. fib with interventricular conduction delay- Personally Reviewed  Physical Exam  Obese Of sleep a sleep GEN: No acute distress.   Neck: No JVD Cardiac: IIRR, no murmurs, rubs, or gallops.  Respiratory: Clear to auscultation bilaterally. GI: Soft, nontender,  non-distended  MS: No edema; No deformity. Neuro:  Nonfocal  Psych: Normal affect   Labs    High Sensitivity Troponin:   Recent Labs  Lab 01/05/21 0456 01/05/21 1203  TROPONINIHS 632* 8,329*     Chemistry Recent Labs  Lab 01/05/21 0456 01/06/21 0412  NA 137 134*  K 4.6 4.0  CL 102 101  CO2 22 24  GLUCOSE 166* 144*  BUN 13 19  CREATININE 1.34* 1.16  CALCIUM 8.6* 8.9  PROT 6.6  --   ALBUMIN 3.4*  --   AST 120*  --   ALT 95*  --   ALKPHOS 53  --   BILITOT 1.6*  --   GFRNONAA 58* >60  ANIONGAP 13 9    Lipids  Recent Labs  Lab 01/05/21 0456  CHOL 134  TRIG 109  HDL 51  LDLCALC 61  CHOLHDL 2.6    Hematology Recent Labs  Lab 01/05/21 0456 01/06/21 0412  WBC 10.8* 7.8  RBC 4.93 4.46  HGB 15.9 13.9  HCT 48.2 42.7  MCV 97.8 95.7  MCH 32.3 31.2  MCHC 33.0 32.6  RDW 13.3 13.5  PLT 171 148*   Thyroid  Recent Labs  Lab 01/05/21 1203  TSH 1.322  FREET4 0.90    BNP Recent Labs  Lab 01/05/21 1203  BNP 626.1*    DDimer No results for input(s): DDIMER in the last 168 hours.   Radiology    CARDIAC CATHETERIZATION  Result Date: 01/05/2021 Images from the original result were not included.   Prox Cx to Mid Cx lesion is 70% stenosed.   Prox RCA to Mid RCA lesion is 90% stenosed.   Mid RCA lesion is 100% stenosed.   Previously placed Prox LAD to Mid LAD stent (unknown type) is  widely patent.   There is severe left ventricular systolic dysfunction.   LV end diastolic pressure is mildly elevated.   The left ventricular ejection fraction is 25-35% by visual estimate. Norman Rodriguez is a 65 y.o. male  [id number] LOCATION:  FACILITY: [facility] PHYSICIAN: Norman Rodriguez, M.D. 1955/05/23 DATE OF PROCEDURE:  01/05/2021 DATE OF DISCHARGE: CARDIAC CATHETERIZATION History obtained from chart review.65 y.o. male  with hx of CAD s/p PCI to LAD, HFrEF, permanent atrial fibrillation on Xarelto, COPD, and essential hypertension who is being seen 01/05/2021 for the evaluation of  NSTEMI.  He presented with some chest pain and monomorphic sustained ventricular tachycardia requiring defibrillation.  Her troponins were elevated at 600. PROCEDURE DESCRIPTION: The patient was brought to the second floor [facility]iac cath lab in the postabsorptive state. He was not premedicated. His right wrist was prepped and shaved in usual sterile fashion. Xylocaine 1% was used for local anesthesia. A 6 French sheath was inserted into the right radial artery using standard Seldinger technique.  Ultrasound was used to guide access.  Digital image was captured and placed the patient's chart.  The patient received 6000 units  of heparin intravenously.  A 5 [place] TIG catheter and pigtail catheter were used for selective coronary angiography and left ventriculography respectively.  Isovue dye was used for the entire to the case (60 cc of contrast total to patient).  Retrograde aortic and left ventricular and pullback pressures were recorded.  Radial cocktail was administered via the SideArm sheath.   [name] had a patent proximal LAD stent placed by Dr. Irish Lack 11/07/2008.  He has an RCA CTO which is old as well with left-to-right collaterals.  The circumflex is small and diffusely diseased.  His LVEDP is mildly elevated and his EF is severely reduced.  I suspect this was a primary arrhythmogenic event.  He will need 2D echocardiography, and EP consult for ICD implantation and probably an antiarrhythmic medication along with guideline directed optimal medical therapy for his ischemic cardiomyopathy.  Heparin will be restarted 4 hours after sheath removal and Xarelto will be restarted once timing of ICD implantation is formalized.  The sheath was removed and a TR band was placed on the right wrist to achieve patent hemostasis.  The patient left lab in stable condition.  Dr. Pernell Dupre, the attending cardiologist, was notified of these results. Norman Rodriguez. MD, [facility] 01/05/2021 8:49 AM    DG Chest  Port 1 View  Result Date: 01/05/2021 CLINICAL DATA:  65 year old male with history of chest pain. EXAM: PORTABLE CHEST 1 VIEW COMPARISON:  Chest x-ray 10/25/2017. FINDINGS: Lung volumes are normal. No consolidative scotch that opacity at the left base which may reflect atelectasis and/or consolidation, along with a superimposed small left pleural effusion. Right lung is clear. No right pleural effusion. Cephalization of the pulmonary vasculature, without frank pulmonary edema. Mild cardiomegaly. Upper mediastinal contours are within normal limits. Atherosclerotic calcifications are noted  in the thoracic aorta. IMPRESSION: 1. Opacity in the left lower lobe which may reflect atelectasis and/or consolidation, with superimposed small left pleural effusion. 2. Cardiomegaly with pulmonary venous congestion, but no frank pulmonary edema. Electronically Signed   By: Vinnie Langton M.D.   On: 01/05/2021 05:17    Cardiac Studies   Cardiac catheterization 01/05/2021: Diagnostic Dominance: Right   Patient Profile     65 y.o. male with history of ischemic cardiomyopathy, CAD s/p PCI to LAD, chronic systolic heart failure, permanent atrial fibrillation on Xarelto, COPD, and essential hypertension who presented with wide-complex tachycardia requiring DC cardioversion in the field.  Cath demonstrated no progression of coronary disease and plan is to perform defibrillator.  Assessment & Plan    Ventricular tachycardia: Substrate is ischemic cardiomyopathy with reduced EF.  Cardiac defibrillator will be placed today. Chronic systolic heart failure due to ischemic cardiomyopathy: Intensify heart failure regimen.  Add SGLT2 and beta-blocker therapy as tolerated. CAD: Aggressive preventive therapy including long-acting nitrate.  For questions or updates, please contact [place] Please consult [email or web address] for contact info under        Signed, Sinclair Grooms, MD  01/06/2021, 9:30 AM

## 2021-01-07 ENCOUNTER — Encounter (HOSPITAL_COMMUNITY): Payer: Self-pay | Admitting: Cardiology

## 2021-01-07 ENCOUNTER — Other Ambulatory Visit: Payer: Self-pay | Admitting: Cardiology

## 2021-01-07 ENCOUNTER — Telehealth: Payer: Self-pay | Admitting: Cardiology

## 2021-01-07 ENCOUNTER — Inpatient Hospital Stay (HOSPITAL_COMMUNITY): Payer: Medicare Other

## 2021-01-07 DIAGNOSIS — I5022 Chronic systolic (congestive) heart failure: Secondary | ICD-10-CM

## 2021-01-07 DIAGNOSIS — I472 Ventricular tachycardia, unspecified: Secondary | ICD-10-CM

## 2021-01-07 LAB — CBC
HCT: 43.9 % (ref 39.0–52.0)
Hemoglobin: 14.6 g/dL (ref 13.0–17.0)
MCH: 31.7 pg (ref 26.0–34.0)
MCHC: 33.3 g/dL (ref 30.0–36.0)
MCV: 95.4 fL (ref 80.0–100.0)
Platelets: 148 10*3/uL — ABNORMAL LOW (ref 150–400)
RBC: 4.6 MIL/uL (ref 4.22–5.81)
RDW: 13.2 % (ref 11.5–15.5)
WBC: 10.2 10*3/uL (ref 4.0–10.5)
nRBC: 0 % (ref 0.0–0.2)

## 2021-01-07 LAB — BASIC METABOLIC PANEL
Anion gap: 8 (ref 5–15)
BUN: 14 mg/dL (ref 8–23)
CO2: 27 mmol/L (ref 22–32)
Calcium: 8.7 mg/dL — ABNORMAL LOW (ref 8.9–10.3)
Chloride: 101 mmol/L (ref 98–111)
Creatinine, Ser: 0.97 mg/dL (ref 0.61–1.24)
GFR, Estimated: >60 mL/min (ref >60)
Glucose, Bld: 111 mg/dL — ABNORMAL HIGH (ref 70–99)
Potassium: 4.2 mmol/L (ref 3.5–5.1)
Sodium: 136 mmol/L (ref 135–145)

## 2021-01-07 MED ORDER — RIVAROXABAN 20 MG PO TABS
20.0000 mg | ORAL_TABLET | Freq: Every day | ORAL | 1 refills | Status: DC
Start: 2021-01-11 — End: 2021-01-07

## 2021-01-07 MED ORDER — DAPAGLIFLOZIN PROPANEDIOL 10 MG PO TABS: 10.0000 mg | ORAL_TABLET | Freq: Every day | ORAL | 6 refills | Status: DC

## 2021-01-07 MED ORDER — AMIODARONE HCL 200 MG PO TABS
400.0000 mg | ORAL_TABLET | Freq: Two times a day (BID) | ORAL | Status: DC
  Administered 2021-01-07: 10:00:00 400 mg via ORAL
  Filled 2021-01-07: qty 2

## 2021-01-07 MED ORDER — FUROSEMIDE 10 MG/ML IJ SOLN
20.0000 mg | Freq: Once | INTRAMUSCULAR | Status: AC
  Administered 2021-01-07: 08:00:00 20 mg via INTRAVENOUS
  Filled 2021-01-07: qty 2

## 2021-01-07 MED ORDER — FUROSEMIDE 10 MG/ML IJ SOLN: 40.0000 mg | Freq: Once | INTRAMUSCULAR | Status: DC

## 2021-01-07 MED ORDER — NITROGLYCERIN 0.4 MG SL SUBL: 0.4000 mg | SUBLINGUAL_TABLET | SUBLINGUAL | 4 refills | Status: DC | PRN

## 2021-01-07 MED ORDER — RIVAROXABAN 20 MG PO TABS: 20.0000 mg | ORAL_TABLET | Freq: Every day | ORAL | 1 refills | Status: DC

## 2021-01-07 MED ORDER — AMIODARONE HCL 400 MG PO TABS: ORAL_TABLET | ORAL | 2 refills | Status: DC

## 2021-01-07 NOTE — Telephone Encounter (Signed)
Pt's medication was sent to pt's pharmacy as requested. Confirmation received.  °

## 2021-01-07 NOTE — Discharge Summary (Addendum)
ELECTROPHYSIOLOGY PROCEDURE DISCHARGE SUMMARY    Patient ID: Norman Rodriguez,  MRN: [id number], DOB/AGE: 1955-03-04 65 y.o.  Admit date: 01/05/2021 Discharge date: 01/07/2021  Primary Care Physician: Loyola Mast, PA-C  Primary Cardiologist: [name], MD  Electrophysiologist: Dr. Curt Bears  Primary Diagnosis:  Sustained Ventricular Tachycardia  Secondary Diagnosis: Chronic systolic CHF Permanent atrial fibrillation  Allergies  Allergen Reactions   Apple Hives   Soybean Oil Other (See Comments)    unknown   Lisinopril Cough   Other Itching    Pt has itching and joint swelling with walnuts, mildew, fungi, basil and peaches   Bean Pod Extract Rash    Green Beans specifically Joint swelling and itching   Carrot [Daucus Carota] Rash    Joint swelling and itching   Grass Extracts [Gramineae Pollens] Rash    Joint swelling and itching   Peach Flavor Rash    Joint swelling and itching   Tree Extract Rash    Joint swelling and itching    Procedures This Admission:  Left Heart Catheterization 01/05/2021 1  Prox Cx to Mid Cx lesion is 70% stenosed.   Prox RCA to Mid RCA lesion is 90% stenosed.   Mid RCA lesion is 100% stenosed.   Previously placed Prox LAD to Mid LAD stent (unknown type) is  widely patent.   There is severe left ventricular systolic dysfunction.   LV end diastolic pressure is mildly elevated.   The left ventricular ejection fraction is 25-35% by visual estimate. 2.  Implantation of a St. Jude single chamber ICD on 01/06/2021 by Dr. Curt Bears.  The patient received a St Jude model number [id number] ICD with model number [id number] right ventricular lead.  DFT's were deferred at time of implant. There were no immediate post procedure complications. 3.  CXR on 01/07/21 demonstrated no pneumothorax status post device implantation.  Brief HPI: Norman Rodriguez is a 65 y.o. male was admitted for chest pain and found to have sustained VT and consulted by  electrophysiology  for consideration of ICD implantation. Past medical history includes above. In setting of chest pain and VT, he underwent LHC as above that showed no target lesion.   The patient has persistent LV dysfunction despite guideline directed therapy.  Risks, benefits, and alternatives to ICD implantation were reviewed with the patient who wished to proceed.   Hospital Course:  The patient was admitted and underwent implantation of a St. Jude single chamber ICD with details as outlined above. They were monitored on telemetry overnight which demonstrated NSR.  Left chest was without hematoma or ecchymosis.  The device was interrogated and found to be functioning normally.  CXR was obtained and demonstrated no pneumothorax status post device implantation..  Wound care, arm mobility, and restrictions were reviewed with the patient.  The patient was examined and considered stable for discharge to home.   The patient's discharge medications include an ACE-I/ARB/ARNI Norman Rodriguez) and beta blocker (coreg). Regarding blood thinner therapy, they were instructed to resume their Xarelto am of tomorrow, 12/15. (He takes it in the mornings instead of afternoons and doesn't want to get off track.)  Pt does not drive. Requires transportation via mother or his insurance company.   Physical Exam: Vitals:   01/06/21 2342 01/07/21 0355 01/07/21 0756 01/07/21 0811  BP: 121/84 (!) 143/89 (!) 132/93   Pulse: 72 73 73 80  Resp: 19 16 16    Temp: 98.8 F (37.1 C) 97.9 F (36.6 C) 97.9 F (36.6 C)  TempSrc: Oral Oral Oral   SpO2: 95% 96% 93%   Weight:      Height:        Labs:   Lab Results  Component Value Date   WBC 10.2 01/07/2021   HGB 14.6 01/07/2021   HCT 43.9 01/07/2021   MCV 95.4 01/07/2021   PLT 148 (L) 01/07/2021    Recent Labs  Lab 01/05/21 0456 01/06/21 0412 01/07/21 0338  NA 137   < > 136  K 4.6   < > 4.2  CL 102   < > 101  CO2 22   < > 27  BUN 13   < > 14  CREATININE 1.34*    < > 0.97  CALCIUM 8.6*   < > 8.7*  PROT 6.6  --   --   BILITOT 1.6*  --   --   ALKPHOS 53  --   --   ALT 95*  --   --   AST 120*  --   --   GLUCOSE 166*   < > 111*   < > = values in this interval not displayed.    Discharge Medications:  Allergies as of 01/07/2021       Reactions   Apple Hives   Soybean Oil Other (See Comments)   unknown   Lisinopril Cough   Other Itching   Pt has itching and joint swelling with walnuts, mildew, fungi, basil and peaches   Bean Pod Extract Rash   Green Beans specifically Joint swelling and itching   Carrot [daucus Carota] Rash   Joint swelling and itching   Grass Extracts [gramineae Pollens] Rash   Joint swelling and itching   Peach Flavor Rash   Joint swelling and itching   Tree Extract Rash   Joint swelling and itching        Medication List     TAKE these medications    amiodarone 400 MG tablet Commonly known as: PACERONE Take 1 tablet (400 mg total) by mouth 2 (two) times daily for 5 days, THEN 1 tablet (400 mg total) daily for 5 days, THEN 0.5 tablets (200 mg total) daily for 21 days. Start taking on: January 07, 2021   Anoro Ellipta 62.5-25 MCG/ACT Aepb Generic drug: umeclidinium-vilanterol INHALE ONE PUFFS INTO THE LUNGS DAILY   atorvastatin 40 MG tablet Commonly known as: LIPITOR Take 1 tablet (40 mg total) by mouth daily.   CARNITINE PO Take 500 mg by mouth daily.   carvedilol 12.5 MG tablet Commonly known as: COREG TAKE ONE TABLET BY MOUTH TWICE A DAY WITH MEALS   Co Q-10 200 MG Caps Take 200 mg by mouth daily.   dapagliflozin propanediol 10 MG Tabs tablet Commonly known as: FARXIGA Take 1 tablet (10 mg total) by mouth daily.   digoxin 0.125 MG tablet Commonly known as: LANOXIN TAKE ONE TABLET BY MOUTH DAILY   Entresto 49-51 MG Generic drug: sacubitril-valsartan TAKE ONE TABLET BY MOUTH TWICE A DAY   fexofenadine 180 MG tablet Commonly known as: ALLEGRA Take 180 mg by mouth daily.    furosemide 40 MG tablet Commonly known as: LASIX TAKE ONE TABLET BY MOUTH DAILY   gabapentin 300 MG capsule Commonly known as: NEURONTIN Take 600 mg by mouth at bedtime as needed (carpal tunnel).   meloxicam 15 MG tablet Commonly known as: MOBIC Take 15 mg by mouth daily as needed for pain.   multivitamin tablet Take 1 tablet by mouth daily.   nitroGLYCERIN 0.4 MG  SL tablet Commonly known as: NITROSTAT Place 1 tablet (0.4 mg total) under the tongue every 5 (five) minutes as needed for chest pain.   NON FORMULARY Take 465 mg by mouth as needed (joint pain). HiActives Tart Cherry   OMEGA 3 PO Take 1 capsule by mouth daily.   rivaroxaban 20 MG Tabs tablet Commonly known as: Xarelto Take 1 tablet (20 mg total) by mouth daily with supper. Start taking on: January 11, 2021 What changed:  See the new instructions. These instructions start on January 11, 2021. If you are unsure what to do until then, ask your doctor or other care provider.   vitamin C 500 MG tablet Commonly known as: ASCORBIC ACID Take 500 mg by mouth daily.   VITAMIN D (CHOLECALCIFEROL) PO Take 5,000 Units by mouth daily.        Disposition:    Follow-up Information     [facility] Follow up.   Why: on 12/28 at 240 for post hospital ICD check. Contact information: [place] [address] [phone]                Duration of Discharge Encounter: Greater than 30 minutes including physician time.  Signed, Norman Friar, PA-C  01/07/2021 11:05 AM    I have seen and examined this patient with Norman Rodriguez.  Agree with above, note added to reflect my findings.  Patient admitted to the hospital with sustained ventricular tachycardia.  Left heart catheterization was performed that showed stable disease and no obvious target for intervention.  He is status post [place] ICD for secondary  prevention.  Amare Kontos discharge today on amiodarone.  We Faith Patricelli likely plan for amiodarone for 3 months and stop at the next visit.  He has had no further ventricular tachycardia since admission.  Calahan Pak M. Terecia Plaut MD 01/07/2021 11:47 AM

## 2021-01-07 NOTE — Progress Notes (Signed)
Reviewed and agree with documentation and assessment and plan. K. Veena Rosabelle Jupin , MD   

## 2021-01-07 NOTE — Telephone Encounter (Signed)
Patient is requesting to speak with Dr. Marlou Porch regarding his complaints while at [facility] He states early Monday morning, 12/12 around 3:00 AM, he has a heart attack and went to the hospital. He states while admitted he about died. He states there were several times where he was breathing heavily, gasping for air and he felt he was ignored. He states at first they refused to give him any Lasix, but they finally gave him a dose yesterday and this morning and he is feeling a little better. He requested a call back on another day if possible because he states he plans on being discharged today and it will be a busy day.

## 2021-01-07 NOTE — Discharge Instructions (Signed)
After Your ICD (Implantable Cardiac Defibrillator)   You have a St. Jude ICD  ACTIVITY Do not lift your arm above shoulder height for 1 week after your procedure. After 7 days, you may progress as below.  You should remove your sling 24 hours after your procedure, unless otherwise instructed by your provider.     Wednesday January 14, 2021  Thursday January 15, 2021 Friday January 16, 2021 Saturday January 17, 2021   Do not lift, push, pull, or carry anything over 10 pounds with the affected arm until 6 weeks (Wednesday February 18, 2021 ) after your procedure.   You may drive AFTER your wound check, unless you have been told otherwise by your provider.   Ask your healthcare provider when you can go back to work   INCISION/Dressing If you are on a blood thinner such as Coumadin, Xarelto, Eliquis, Plavix, or Pradaxa please confirm with your provider when this should be resumed.   If large square, outer bandage is left in place, this can be removed after 24 hours from your procedure. Do not remove steri-strips or glue as below.   Monitor your defibrillator site for redness, swelling, and drainage. Call the device clinic at [phone] if you experience these symptoms or fever/chills.  If your incision is sealed with Steri-strips or staples, you may shower 10 days after your procedure or when told by your provider. Do not remove the steri-strips or let the shower hit directly on your site. You may wash around your site with soap and water.    If you were discharged in a sling, please do not wear this during the day more than 48 hours after your surgery unless otherwise instructed. This may increase the risk of stiffness and soreness in your shoulder.   Avoid lotions, ointments, or perfumes over your incision until it is well-healed.  You may use a hot tub or a pool AFTER your wound check appointment if the incision is completely closed.  Your ICD is designed to protect you from  life threatening heart rhythms. Because of this, you may receive a shock.   1 shock with no symptoms:  Call the office during business hours. 1 shock with symptoms (chest pain, chest pressure, dizziness, lightheadedness, shortness of breath, overall feeling unwell):  Call 911. If you experience 2 or more shocks in 24 hours:  Call 911. If you receive a shock, you should not drive for 6 months per the [place] DMV IF you receive appropriate therapy from your ICD.   ICD Alerts:  Some alerts are vibratory and others beep. These are NOT emergencies. Please call our office to let us know. If this occurs at night or on weekends, it can wait until the next business day. Send a remote transmission.  If your device is capable of reading fluid status (for heart failure), you will be offered monthly monitoring to review this with you.   DEVICE MANAGEMENT Remote monitoring is used to monitor your ICD from home. This monitoring is scheduled every 91 days by our office. It allows [place] to keep an eye on the functioning of your device to ensure it is working properly. You will routinely see your Electrophysiologist annually (more often if necessary).   You should receive your ID card for your new device in 4-8 weeks. Keep this card with you at all times once received. Consider wearing a medical alert bracelet or necklace.  Your ICD  may be MRI compatible. This will be discussed at your  next office visit/wound check.  You should avoid contact with strong electric or magnetic fields.   Do not use amateur (ham) radio equipment or electric (arc) welding torches. MP3 player headphones with magnets should not be used. Some devices are safe to use if held at least 12 inches (30 cm) from your defibrillator. These include power tools, lawn mowers, and speakers. If you are unsure if something is safe to use, ask your health care provider.  When using your cell phone, hold it to the ear that is on the opposite side from the  defibrillator. Do not leave your cell phone in a pocket over the defibrillator.  You may safely use electric blankets, heating pads, computers, and microwave ovens.  Call the office right away if: You have chest pain. You feel more than one shock. You feel more short of breath than you have felt before. You feel more light-headed than you have felt before. Your incision starts to open up.  This information is not intended to replace advice given to you by your health care provider. Make sure you discuss any questions you have with your health care provider.

## 2021-01-07 NOTE — Progress Notes (Signed)
Will defer to EP.

## 2021-01-07 NOTE — Telephone Encounter (Addendum)
°*  STAT* If patient is at the pharmacy, call can be transferred to refill team.   1. Which medications need to be refilled? (please list name of each medication and dose if known)  amiodarone (PACERONE) 400 MG tablet ANORO ELLIPTA 62.5-25 MCG/INH AEPB nitroGLYCERIN (NITROSTAT) SL tablet 0.4 mg dapagliflozin propanediol (FARXIGA) 10 MG TABS tablet  2. Which pharmacy/location (including street and city if local pharmacy) is medication to be sent to? [place], [place]  3. Do they need a 30 day or 90 day supply? 90 day

## 2021-01-07 NOTE — Progress Notes (Addendum)
Electrophysiology Rounding Note  Patient Name: Norman Rodriguez Date of Encounter: 01/07/2021  Primary Cardiologist: [name], Rodriguez Electrophysiologist: Cristopher Peru, Rodriguez   Subjective   SOB much improved with IV lasix dose. Doesn't want as much this am.   Inpatient Medications    Scheduled Meds:  amiodarone  400 mg Oral BID   atorvastatin  80 mg Oral Daily   carvedilol  12.5 mg Oral BID WC   dapagliflozin propanediol  10 mg Oral Daily   furosemide  20 mg Intravenous Once   isosorbide mononitrate  15 mg Oral Daily   sodium chloride flush  3 mL Intravenous Q12H   Continuous Infusions:  sodium chloride      ceFAZolin (ANCEF) IV Stopped (01/07/21 0428)   PRN Meds: sodium chloride, acetaminophen, morphine injection, nitroGLYCERIN, ondansetron (ZOFRAN) IV, sodium chloride flush   Vital Signs    Vitals:   01/06/21 2050 01/06/21 2155 01/06/21 2342 01/07/21 0355  BP: 138/86 133/87 121/84 (!) 143/89  Pulse: 77  72 73  Resp:   19 16  Temp:   98.8 F (37.1 C) 97.9 F (36.6 C)  TempSrc:   Oral Oral  SpO2: 94%  95% 96%  Weight:      Height:        Intake/Output Summary (Last 24 hours) at 01/07/2021 0732 Last data filed at 01/07/2021 0600 Gross per 24 hour  Intake 1060.23 ml  Output 4500 ml  Net -3439.77 ml   Filed Weights   01/05/21 0451  Weight: 117.9 kg    Physical Exam    GEN- The patient is well appearing, alert and oriented x 3 today.   Head- normocephalic, atraumatic Eyes-  Sclera clear, conjunctiva pink Ears- hearing intact Oropharynx- clear Neck- supple Lungs- Clear to ausculation bilaterally, normal work of breathing Heart- Regular rate and rhythm, no murmurs, rubs or gallops GI- soft, NT, ND, + BS Extremities- no clubbing or cyanosis. No edema Skin- no rash or lesion Psych- euthymic mood, full affect Neuro- strength and sensation are intact  Labs    CBC Recent Labs    01/05/21 0456 01/06/21 0412 01/07/21 0338  WBC 10.8* 7.8 10.2   NEUTROABS 8.3*  --   --   HGB 15.9 13.9 14.6  HCT 48.2 42.7 43.9  MCV 97.8 95.7 95.4  PLT 171 148* 932*   Basic Metabolic Panel Recent Labs    01/06/21 0412 01/07/21 0338  NA 134* 136  K 4.0 4.2  CL 101 101  CO2 24 27  GLUCOSE 144* 111*  BUN 19 14  CREATININE 1.16 0.97  CALCIUM 8.9 8.7*   Liver Function Tests Recent Labs    01/05/21 0456  AST 120*  ALT 95*  ALKPHOS 53  BILITOT 1.6*  PROT 6.6  ALBUMIN 3.4*   No results for input(s): LIPASE, AMYLASE in the last 72 hours. Cardiac Enzymes No results for input(s): CKTOTAL, CKMB, CKMBINDEX, TROPONINI in the last 72 hours.   Telemetry    Atrial fibrillation 80s (personally reviewed)  Radiology    CARDIAC CATHETERIZATION  Result Date: 01/05/2021 Images from the original result were not included.   Prox Cx to Mid Cx lesion is 70% stenosed.   Prox RCA to Mid RCA lesion is 90% stenosed.   Mid RCA lesion is 100% stenosed.   Previously placed Prox LAD to Mid LAD stent (unknown type) is  widely patent.   There is severe left ventricular systolic dysfunction.   LV end diastolic pressure is mildly elevated.  The left ventricular ejection fraction is 25-35% by visual estimate. Norman Rodriguez is a 65 y.o. male  [id number] LOCATION:  FACILITY: [facility] PHYSICIAN: Norman Rodriguez, M.D. [birth year] DATE OF PROCEDURE:  01/05/2021 DATE OF DISCHARGE: CARDIAC CATHETERIZATION History obtained from chart review.65 y.o. male  with hx of CAD s/p PCI to LAD, HFrEF, permanent atrial fibrillation on Xarelto, COPD, and essential hypertension who is being seen 01/05/2021 for the evaluation of NSTEMI.  He presented with some chest pain and monomorphic sustained ventricular tachycardia requiring defibrillation.  Her troponins were elevated at 600. PROCEDURE DESCRIPTION: The patient was brought to the second floor [facility] cath lab in the postabsorptive state. He was not premedicated. His right wrist was prepped and shaved in usual sterile fashion.  Xylocaine 1% was used for local anesthesia. A 6 French sheath was inserted into the right radial artery using standard Seldinger technique.  Ultrasound was used to guide access.  Digital image was captured and placed the patient's chart.  The patient received 6000 units  of heparin intravenously.  A 5 [place] TIG catheter and pigtail catheter were used for selective coronary angiography and left ventriculography respectively.  Isovue dye was used for the entire to the case (60 cc of contrast total to patient).  Retrograde aortic and left ventricular and pullback pressures were recorded.  Radial cocktail was administered via the SideArm sheath.   Mr. Lankford had a patent proximal LAD stent placed by Dr. Irish Lack 11/07/2008.  He has an RCA CTO which is old as well with left-to-right collaterals.  The circumflex is small and diffusely diseased.  His LVEDP is mildly elevated and his EF is severely reduced.  I suspect this was a primary arrhythmogenic event.  He Norman Rodriguez need 2D echocardiography, and EP consult for ICD implantation and probably an antiarrhythmic medication along with guideline directed optimal medical therapy for his ischemic cardiomyopathy.  Heparin Norman Rodriguez be restarted 4 hours after sheath removal and Xarelto Kayloni Rocco be restarted once timing of ICD implantation is formalized.  The sheath was removed and a TR band was placed on the right wrist to achieve patent hemostasis.  The patient left lab in stable condition.  Dr. Pernell Dupre, the attending cardiologist, was notified of these results. Norman Rodriguez. Rodriguez, Norman Rodriguez 01/05/2021 8:49 AM    EP PPM/ICD IMPLANT  Result Date: 01/06/2021  SURGEON:  Allegra Lai, Rodriguez    PREPROCEDURE DIAGNOSES:  1. Ischemic cardiomyopathy.  2. New York Heart Association class III, heart failure chronically.  3. Ventricular tachycardia    POSTPROCEDURE DIAGNOSES:  1. Ischemic cardiomyopathy.  2. New York Heart Association class III heart failure chronically.  3. Ventricular tachycardia     PROCEDURES:   1. ICD implantation.    INTRODUCTION: Norman Rodriguez is a 65 y.o. male with an ischemic CM (EF30-35%), NYHA Class III CHF, and CAD. At this time, he meets MADIT II/ SCD-HeFT criteria for ICD implantation for primary prevention of sudden death.  The patient has a narrow QRS and does not meet criteria for revascularization.  The patient presented to the hospital with VT requiring cardioversion.  The patient therefore  presents today for ICD implantation.    DESCRIPTION OF PROCEDURE:  Informed written consent was obtained and the patient was brought to the electrophysiology lab in the fasting state. The patient was adequately sedated with intravenous Versed, and fentanyl as outlined in the nursing report.  The patient's left chest was prepped and draped in the usual sterile fashion by the EP lab  staff.  The skin overlying the left deltopectoral region was infiltrated with lidocaine for local analgesia.  A 5-cm incision was made over the left deltopectoral region.  A left subcutaneous defibrillator pocket was fashioned using a combination of sharp and blunt dissection.  Electrocautery was used to assure hemostasis. RV Lead Placement: The left axillary vein was cannulated with fluoroscopic visualization.  No contrast was required for this endeavor.  Through the left axillary vein, a [employer]. Jude [employer], model 7122Q-65 (serial number [id number]) right ventricular defibrillator lead was advanced with fluoroscopic visualization into the right ventricular apex position.  Initial right ventricular lead R-wave measured 16 mV with impedance of 613 ohms and a threshold of 0.5 volts at 0.5 milliseconds. The leads were secured to the pectoralis  fascia using #2 silk suture over the suture sleeves.  The pocket then  irrigated with copious gentamicin solution.  The leads were then  connected to an [place] (serial  Number [id number]) ICD.  The defibrillator was placed into the  pocket.   The pocket was then closed in 3 layers with 2.0 Vicryl suture  for the subcutaneous and 3.0 Vicryl suture subcuticular layers.  EBL<74ml. Steri-Strips and a  sterile dressing were then applied. complications.    CONCLUSIONS:  1. Ischemic cardiomyopathy with chronic New York Heart Association class III heart failure.  2. Successful ICD implantation.  3. No early apparent complications.   ECHOCARDIOGRAM COMPLETE  Result Date: 01/06/2021    ECHOCARDIOGRAM REPORT   Patient Name:   Norman Rodriguez Date of Exam: 01/06/2021 Medical Rec #:  [id number]         Height:       68.0 in Accession #:    [id number]        Weight:       260.0 lb Date of Birth:  11/21/1955         BSA:          2.285 m Patient Age:    55 years          BP:           127/83 mmHg Patient Gender: M                 HR:           75 bpm. Exam Location:  Inpatient Procedure: 2D Echo, Cardiac Doppler, Color Doppler and Intracardiac            Opacification Agent Indications:    NSTEMI I21.4  History:        Patient has prior history of Echocardiogram examinations, most                 recent 03/21/2017. CHF, CAD, COPD, Arrythmias:Atrial                 Fibrillation, Signs/Symptoms:Shortness of Breath; Risk                 Factors:Dyslipidemia and Hypertension.  Sonographer:    Bernadene Person RDCS Referring Phys: [place]  1. There is no left ventricular thrombus. Left ventricular ejection fraction, by estimation, is 30 to 35%. The left ventricle has moderately decreased function. The left ventricle demonstrates regional wall motion abnormalities (see scoring diagram/findings for description). The left ventricular internal cavity size was mildly dilated. Left ventricular diastolic function could not be evaluated. There is akinesis of the left ventricular, entire inferior wall, inferolateral wall and inferoseptal wall.  2. Right ventricular  systolic function is mildly reduced. The right ventricular size is normal. There is  moderately elevated pulmonary artery systolic pressure.  3. Left atrial size was moderately dilated.  4. Right atrial size was mildly dilated.  5. The mitral valve is normal in structure. Mild mitral valve regurgitation.  6. The aortic valve is tricuspid. Aortic valve regurgitation is not visualized. Aortic valve sclerosis is present, with no evidence of aortic valve stenosis.  7. The inferior vena cava is dilated in size with <50% respiratory variability, suggesting right atrial pressure of 15 mmHg. Comparison(s): No significant change from prior study. Prior images reviewed side by side. FINDINGS  Left Ventricle: There is no left ventricular thrombus. Left ventricular ejection fraction, by estimation, is 30 to 35%. The left ventricle has moderately decreased function. The left ventricle demonstrates regional wall motion abnormalities. Definity contrast agent was given IV to delineate the left ventricular endocardial borders. The left ventricular internal cavity size was mildly dilated. There is no left ventricular hypertrophy. Left ventricular diastolic function could not be evaluated due to atrial fibrillation. Left ventricular diastolic function could not be evaluated.  LV Wall Scoring: The entire inferior wall, posterior wall, mid inferoseptal segment, and basal inferoseptal segment are akinetic. Right Ventricle: The right ventricular size is normal. No increase in right ventricular wall thickness. Right ventricular systolic function is mildly reduced. There is moderately elevated pulmonary artery systolic pressure. The tricuspid regurgitant velocity is 3.63 m/s, and with an assumed right atrial pressure of 15 mmHg, the estimated right ventricular systolic pressure is 16.1 mmHg. Left Atrium: Left atrial size was moderately dilated. Right Atrium: Right atrial size was mildly dilated. Pericardium: Trivial pericardial effusion is present. Mitral Valve: The mitral valve is normal in structure. Mild mitral valve  regurgitation, with centrally-directed jet. Tricuspid Valve: The tricuspid valve is normal in structure. Tricuspid valve regurgitation is trivial. Aortic Valve: The aortic valve is tricuspid. Aortic valve regurgitation is not visualized. Aortic valve sclerosis is present, with no evidence of aortic valve stenosis. Pulmonic Valve: The pulmonic valve was not well visualized. Pulmonic valve regurgitation is not visualized. Aorta: The aortic root and ascending aorta are structurally normal, with no evidence of dilitation. Venous: The inferior vena cava is dilated in size with less than 50% respiratory variability, suggesting right atrial pressure of 15 mmHg. IAS/Shunts: No atrial level shunt detected by color flow Doppler.  LEFT VENTRICLE PLAX 2D LVIDd:         6.20 cm LVIDs:         5.73 cm LV PW:         0.90 cm LV IVS:        0.80 cm LVOT diam:     2.10 cm LV SV:         51 LV SV Index:   22 LVOT Area:     3.46 cm  LV Volumes (MOD) LV vol d, MOD A2C: 271.0 ml LV vol d, MOD A4C: 196.0 ml LV vol s, MOD A2C: 193.0 ml LV vol s, MOD A4C: 138.0 ml LV SV MOD A2C:     78.0 ml LV SV MOD A4C:     196.0 ml LV SV MOD BP:      70.4 ml RIGHT VENTRICLE TAPSE (M-mode): 1.5 cm LEFT ATRIUM             Index        RIGHT ATRIUM           Index LA diam:  4.60 cm 2.01 cm/m   RA Area:     20.10 cm LA Vol (A2C):   63.1 ml 27.62 ml/m  RA Volume:   61.80 ml  27.05 ml/m LA Vol (A4C):   65.8 ml 28.80 ml/m LA Biplane Vol: 70.1 ml 30.68 ml/m  AORTIC VALVE LVOT Vmax:   78.77 cm/s LVOT Vmean:  51.533 cm/s LVOT VTI:    0.147 m  AORTA Ao Root diam: 3.30 cm Ao Asc diam:  3.50 cm MR Peak grad:    91.0 mmHg    TRICUSPID VALVE MR Mean grad:    69.0 mmHg    TR Peak grad:   52.7 mmHg MR Vmax:         477.00 cm/s  TR Vmax:        363.00 cm/s MR Vmean:        402.0 cm/s MR PISA:         0.57 cm     SHUNTS MR PISA Eff ROA: 4 mm        Systemic VTI:  0.15 m MR PISA Radius:  0.30 cm      Systemic Diam: 2.10 cm Norman Rodriguez Electronically  signed by Norman Klein Rodriguez Signature Date/Time: 01/06/2021/12:11:58 PM    Final     Patient Profile     Norman Rodriguez is a 65 y.o. male with a history of tachy induced CM, CAD s/p PCI to LAD, COP, HTN, and permanent atrial fibrillation having failed amiodarone and tikosyn.  who is being seen today for the evaluation of VT at the request of Dr. Tamala Julian.  Assessment & Plan    1.  Monomorphic VT S/p St Jude single chamber ICD Device interrogation stable CXR appears stable, formal read pending Amiodarone to po for taper and eventual cessation.    2. NSTEMI/ CAD Cath 12/12 with stable disease and no target lesion Maximize medical therapy   3. Chronic systolic CHF Echo 40/34/7425 LVEF 30-35% GDMT including ARNi and BB PTA.  Volume slightly up with IVF from procedures Much improved with IV lasix last night, give small dose this am. Then to po.    4. Permanent atrial fibrillation Rate controlled Xarelto on hold for procedures. Resumption time per Rodriguez, likely over weekend. Ameerah Huffstetler clarify.  Digoxin level stable on admit.   Device stable. Mild volume overload. Potentially plan home this afternoon.    For questions or updates, please contact [place] Please consult [email or web address] for contact info under Cardiology/STEMI.  Signed, Shirley Friar, PA-C  01/07/2021, 7:32 AM   I have seen and examined this patient with Oda Kilts.  Agree with above, note added to reflect my findings.  Feeling much improved.  Net out 3 L.  GEN: Well nourished, well developed, in no acute distress  HEENT: normal  Neck: no JVD, carotid bruits, or masses Cardiac: Irregular; no murmurs, rubs, or gallops,no edema  Respiratory:  clear to auscultation bilaterally, normal work of breathing GI: soft, nontender, nondistended, + BS MS: no deformity or atrophy  Skin: warm and dry, device site well healed Neuro:  Strength and sensation are intact Psych: euthymic mood, full affect   Monomorphic  ventricular tachycardia: Status post [place] ICD.  Device functioning appropriately.  Would be okay for discharge today after an extra dose of Lasix.  We Ester Mabe discharge on p.o. Lasix 400 mg daily for 1 month followed by 200 mg daily.  This Brodi Kari likely be stopped at follow-up. Chronic systolic heart failure: Currently on optimal  medical therapy.  He is volume overloaded.  Received IV Lasix yesterday.  We Maguire Killmer get 1 more dose today prior to discharge. Permanent atrial fibrillation: Currently well rate controlled.  We Miosotis Wetsel restart Xarelto tomorrow.  Bhavik Cabiness M. Edi Gorniak Rodriguez 01/07/2021 8:46 AM

## 2021-01-08 ENCOUNTER — Telehealth: Payer: Self-pay | Admitting: Cardiology

## 2021-01-08 NOTE — Telephone Encounter (Signed)
Inbound call from patient states there is no way he can make his appt 12/27. States he will call back when he would like to reschedule.

## 2021-01-08 NOTE — Telephone Encounter (Signed)
Patient has been notified and is aware.

## 2021-01-08 NOTE — Telephone Encounter (Signed)
Pt c/o medication issue:  1. Name of Medication: rivaroxaban (XARELTO) 20 MG TABS tablet  2. How are you currently taking this medication (dosage and times per day)? Take 1 tablet (20 mg total) by mouth daily with supper.  3. Are you having a reaction (difficulty breathing--STAT)? No   4. What is your medication issue? Patient is under the impression that for this medication is supposed to change for him. Please advise

## 2021-01-08 NOTE — Telephone Encounter (Signed)
Spoke with patient regarding his recent hospitalization.  He states some confusion regarding when he is supposed resume his Xarelto.  Reviewed EP discharge summary by Oda Kilts who instructs to resume December 18.  Pt states understanding.  He was grateful for the call back and information.  He will c/b with any further questions or concerns.

## 2021-01-08 NOTE — Telephone Encounter (Signed)
Spoke with pt and reviewed his concerns.   He was concerned he wasn't being given his medication appropriately while in the hospital.  Now that he has been discharged and back at home, he is less frustrated.  Pt wants Dr Marlou Porch to know how much he appreciated him.  Will forward to Dr Marlou Porch for his knowledge.

## 2021-01-08 NOTE — Telephone Encounter (Signed)
Called the patient. No answer. Left a voicemail asking he call us to confirm he will or will not be keeping the appointment on 01/20/21.

## 2021-01-09 ENCOUNTER — Telehealth: Payer: Self-pay

## 2021-01-09 NOTE — Telephone Encounter (Signed)
Received fax stating that pt needed PA for Amiodarone 400mg . I called the pharmacy to ask them to see if we changed it to 200mg  if it would be covered and she stated that they ran it through as the 200mg  and the patient already has the medication.

## 2021-01-09 NOTE — Telephone Encounter (Signed)
Procedure cancelled as requested by the patient.

## 2021-01-14 NOTE — Telephone Encounter (Signed)
Recall entered into EPIC 

## 2021-01-20 ENCOUNTER — Encounter (HOSPITAL_COMMUNITY): Payer: Self-pay

## 2021-01-20 ENCOUNTER — Ambulatory Visit (HOSPITAL_COMMUNITY): Admit: 2021-01-20 | Payer: Medicare Other | Admitting: Gastroenterology

## 2021-01-20 SURGERY — COLONOSCOPY WITH PROPOFOL
Anesthesia: Monitor Anesthesia Care

## 2021-01-21 ENCOUNTER — Ambulatory Visit (INDEPENDENT_AMBULATORY_CARE_PROVIDER_SITE_OTHER): Payer: Medicare Other

## 2021-01-21 ENCOUNTER — Other Ambulatory Visit: Payer: Self-pay

## 2021-01-21 DIAGNOSIS — I255 Ischemic cardiomyopathy: Secondary | ICD-10-CM

## 2021-01-21 NOTE — Patient Instructions (Signed)
   After Your ICD (Implantable Cardiac Defibrillator)    Monitor your defibrillator site for redness, swelling, and drainage. Call the device clinic at [phone] if you experience these symptoms or fever/chills.  Your incision was closed with Steri-strips or staples:  You may shower 7 days after your procedure and wash your incision with soap and water. Avoid lotions, ointments, or perfumes over your incision until it is well-healed.    You may use a hot tub or a pool after your wound check appointment if the incision is completely closed.  Do not lift, push or pull greater than 10 pounds with the affected arm until 6 weeks after your procedure. There are no other restrictions in arm movement after your wound check appointment.  Your ICD is MRI compatible.  Your ICD is designed to protect you from life threatening heart rhythms. Because of this, you may receive a shock.   1 shock with no symptoms:  Call the office during business hours. 1 shock with symptoms (chest pain, chest pressure, dizziness, lightheadedness, shortness of breath, overall feeling unwell):  Call 911. If you experience 2 or more shocks in 24 hours:  Call 911. If you receive a shock, you should not drive.  [place] DMV - no driving for 6 months if you receive appropriate therapy from your ICD.   ICD Alerts:  Some alerts are vibratory and others beep. These are NOT emergencies. Please call our office to let us know. If this occurs at night or on weekends, it can wait until the next business day. Send a remote transmission.  If your device is capable of reading fluid status (for heart failure), you will be offered monthly monitoring to review this with you.   Remote monitoring is used to monitor your ICD from home. This monitoring is scheduled every 91 days by our office. It allows us to keep an eye on the functioning of your device to ensure it is working properly. You will routinely see your Electrophysiologist annually  (more often if necessary).   

## 2021-01-23 LAB — CUP PACEART INCLINIC DEVICE CHECK
Battery Remaining Longevity: 124 mo
Brady Statistic RV Percent Paced: 1.7 %
Date Time Interrogation Session: 20221228144700
HighPow Impedance: 61.875
Implantable Lead Implant Date: 20221213
Implantable Lead Location: 753860
Implantable Pulse Generator Implant Date: 20221213
Lead Channel Impedance Value: 587.5 Ohm
Lead Channel Pacing Threshold Amplitude: 0.5 V
Lead Channel Pacing Threshold Pulse Width: 0.5 ms
Lead Channel Sensing Intrinsic Amplitude: 11.4 mV
Lead Channel Setting Pacing Amplitude: 3.5 V
Lead Channel Setting Pacing Pulse Width: 0.5 ms
Lead Channel Setting Sensing Sensitivity: 0.5 mV
Pulse Gen Serial Number: 810054243

## 2021-01-23 NOTE — Progress Notes (Signed)
Wound check appointment. Steri-strips removed. Wound without redness or edema. Incision edges approximated, wound well healed. Normal device function. Thresholds, sensing, and impedances consistent with implant measurements. Device programmed at 3.5V for extra safety margin until 3 month visit. Histogram distribution appropriate for patient and level of activity. No ventricular arrhythmias noted. Patient educated about wound care, arm mobility, lifting restrictions, shock plan. Patient is enrolled in remote monitoring with next transmission scheduled 04/06/21. 91 day FU with Dr. Curt Bears 04/30/21.

## 2021-01-30 ENCOUNTER — Telehealth: Payer: Self-pay | Admitting: Cardiology

## 2021-01-30 NOTE — Telephone Encounter (Signed)
Spoke with pt who states he has been having dizziness that seems worse today.  He notices it every time he gets up.  He is sitting for a minute before standing up.  He is using his cane for safety.  He is unable to check his blood pressure at home and has no idea as to what it is.  HR has been between 55 to 120 bpm per his sat monitor but pt does have known At Fib.  He reports he is eating and drinking well, no dehydration.  He will be going out later today and will try to stop by a pharmacy to check his BP.  He will call back in follow up if it is too low.  Advised to keep a diary of blood pressures/HR when he is able to check them and bring them to his upcoming appointment with Dr Marlou Porch.  He states understanding.  Pt is aware I will forward this information to Dr Marlou Porch for his review and call back if any changes in medications etc.

## 2021-01-30 NOTE — Telephone Encounter (Signed)
Pt c/o medication issue:  1. Name of Medication:  amiodarone (PACERONE) 400 MG tablet dapagliflozin propanediol (FARXIGA) 10 MG TABS tablet  2. How are you currently taking this medication (dosage and times per day)?  Amiodarone- one tablet at night Farxiga- one tablet at night  3. Are you having a reaction (difficulty breathing--STAT)?   4. What is your medication issue? Patient said he is very dizzy in the morning. He takes these medications at night and wanted to know if they are interacting with each other .  He also takes ENTRESTO 49-51 MG and carvedilol (COREG) 12.5 MG tablet twice daily

## 2021-01-31 NOTE — Telephone Encounter (Signed)
Let's try cutting Entresto down to 24/26 mg PO QD This may help with dizziness.   [name], MD

## 2021-02-02 MED ORDER — SACUBITRIL-VALSARTAN 24-26 MG PO TABS: 1.0000 | ORAL_TABLET | Freq: Two times a day (BID) | ORAL | 6 refills | Status: DC

## 2021-02-02 NOTE — Telephone Encounter (Signed)
Left message for pt to call back to discuss medication change

## 2021-02-02 NOTE — Telephone Encounter (Signed)
Pt has still be having dizziness.  He is aware to decrease Entresto from 49-51 to 24-26 mg twice a day.  New RX sent into [facility] as requested.  He will continue to monitor his s/s and call back if no improvement.  He will f/u as scheduled 02/11/21 with Dr Marlou Porch.

## 2021-02-06 ENCOUNTER — Other Ambulatory Visit: Payer: Self-pay | Admitting: Cardiology

## 2021-02-06 NOTE — Telephone Encounter (Signed)
Prescription refill request for Xarelto received.  Indication: Afib  Last office visit:06/24/20 ([place])  Weight: 117.9kg Age: 66 Scr: 0.97 (01/07/21) CrCl: 122.66ml/min  Appropriate dose and refill sent to requested pharmacy.

## 2021-02-11 ENCOUNTER — Encounter: Payer: Self-pay | Admitting: Cardiology

## 2021-02-11 ENCOUNTER — Other Ambulatory Visit: Payer: Self-pay

## 2021-02-11 ENCOUNTER — Ambulatory Visit (INDEPENDENT_AMBULATORY_CARE_PROVIDER_SITE_OTHER): Payer: Medicare Other | Admitting: Cardiology

## 2021-02-11 DIAGNOSIS — I4729 Other ventricular tachycardia: Secondary | ICD-10-CM

## 2021-02-11 DIAGNOSIS — I5022 Chronic systolic (congestive) heart failure: Secondary | ICD-10-CM | POA: Diagnosis not present

## 2021-02-11 DIAGNOSIS — I2510 Atherosclerotic heart disease of native coronary artery without angina pectoris: Secondary | ICD-10-CM

## 2021-02-11 NOTE — Patient Instructions (Signed)
Medication Instructions:  Your physician has recommended you make the following change in your medication:  REMOVED: Farxiga from your medication list   *If you need a refill on your cardiac medications before your next appointment, please call your pharmacy*   Lab Work: NONE If you have labs (blood work) drawn today and your tests are completely normal, you will receive your results only by: [place] (if you have MyChart) OR A paper copy in the mail If you have any lab test that is abnormal or we need to change your treatment, we will call you to review the results.   Testing/Procedures: NONE   Follow-Up: At [facility], you and your health needs are our priority.  As part of our continuing mission to provide you with exceptional heart care, we have created designated Provider Care Teams.  These Care Teams include your primary Cardiologist (physician) and Advanced Practice Providers (APPs -  Physician Assistants and Nurse Practitioners) who all work together to provide you with the care you need, when you need it.  We recommend signing up for the patient portal called "MyChart".  Sign up information is provided on this After Visit Summary.  MyChart is used to connect with patients for Virtual Visits (Telemedicine).  Patients are able to view lab/test results, encounter notes, upcoming appointments, etc.  Non-urgent messages can be sent to your provider as well.   To learn more about what you can do with MyChart, go to [email or web address].    Your next appointment:   3 month(s)  The format for your next appointment:   In Person  Provider:   [name], MD

## 2021-02-11 NOTE — Assessment & Plan Note (Signed)
Defibrillator in place.  Appreciate EP.  He is now off of his amiodarone.  Continue to monitor.  Carvedilol

## 2021-02-11 NOTE — Assessment & Plan Note (Signed)
Trouble with blood pressure, hypotension on goal-directed medical therapy.  We decreased his Entresto to 24/26.  Stopped his [place].  He was hesitant to take this medication worried about side effects.  Continue with digoxin carvedilol 12.5 twice a day.  Prior VT.  EF 25 to 30%.  NYHA class II-III symptoms.  Also on furosemide 40 mg a day.

## 2021-02-11 NOTE — Assessment & Plan Note (Signed)
Bare-metal stent to LAD widely patent.  CTO of RCA previously unsuccessful with good left-to-right collaterals.  Circumflex disease/occlusion now noted.  No PCI targets.

## 2021-02-11 NOTE — Progress Notes (Signed)
Cardiology Office Note:    Date:  02/11/2021   ID:  Norman Rodriguez, DOB [birth year], MRN [id number]  PCP:  Annye English   [facility] Providers Cardiologist:  [name], MD Electrophysiologist:  Cristopher Peru, MD     Referring MD: Loyola Mast, PA-C    History of Present Illness:    Norman Rodriguez is a 66 y.o. male with recent ICD placement in December 2022 after hospitalization with non-STEMI, sustained ventricular tachycardia, alert, spontaneous conversion with cardiac catheterization demonstrating three-vessel CAD however no targets amenable for PCI.  See below.  Cardiac catheterization personally reviewed, hospital notes reviewed.  Discussed with him. He finished his 21 days of amiodarone.  He also did not like the potential side effects of [place].  We had to decrease his Entresto to low-dose because of dizziness.  I like for him to be on at least moderate dose carvedilol given the prior VT.  Thankfully, he was amenable this time to ICD, Dr. Curt Bears.  Today he is doing fairly well.  No recent chest pain.  No significant shortness of breath.  Past Medical History:  Diagnosis Date   Arthritis    Atrial fibrillation ([place])    a. Dx 2010. b. Initiated on Tikosyn 03/2013.   CAD (coronary artery disease)    a. BMS 2.53mm 23 to LAD. CTO of RCA unsuccessful, good left to right collaterals - 2010.   Chronic anticoagulation    Chronic systolic CHF (congestive heart failure) ([place])    a. Both ischemic and tachy induced. b. Echo 04/2013: EF 30-35%, no RWMA, normal RV, mod dilated LA (before restoration of NSR).   COPD (chronic obstructive pulmonary disease) ([place]) 10/22/2017   Dysrhythmia    hx of atrial fibrilation   HTN (hypertension)    Hyperlipidemia    NSVT (nonsustained ventricular tachycardia)    Obesity    Shortness of breath     Past Surgical History:  Procedure Laterality Date   CARDIOVERSION N/A 04/27/2013   Procedure: BEDSIDE CARDIOVERSION;  Surgeon:  Sanda Klein, MD;  Location: [place];  Service: Cardiovascular;  Laterality: N/A;   CARDIOVERSION N/A 05/15/2013   Procedure: CARDIOVERSION/BEDSIDE;  Surgeon: Larey Dresser, MD;  Location: [place];  Service: Cardiovascular;  Laterality: N/A;   CARDIOVERSION N/A 05/16/2014   Procedure: CARDIOVERSION;  Surgeon: Jerline Pain, MD;  Location: [place];  Service: Cardiovascular;  Laterality: N/A;   CARDIOVERSION N/A 06/07/2014   Procedure: CARDIOVERSION;  Surgeon: Jerline Pain, MD;  Location: [place];  Service: Cardiovascular;  Laterality: N/A;   COLONOSCOPY WITH PROPOFOL N/A 10/27/2017   Procedure: COLONOSCOPY WITH PROPOFOL;  Surgeon: Mauri Pole, MD;  Location: [facility];  Service: Endoscopy;  Laterality: N/A;   CORONARY ANGIOPLASTY WITH STENT PLACEMENT  2010   BMS 2.74mm 23 to LAD. CTO of RCA unsuccessful, good left to right collaterals   ESOPHAGOGASTRODUODENOSCOPY (EGD) WITH PROPOFOL N/A 10/24/2017   Procedure: ESOPHAGOGASTRODUODENOSCOPY (EGD) WITH PROPOFOL;  Surgeon: Mauri Pole, MD;  Location: [facility];  Service: Endoscopy;  Laterality: N/A;   FOOT SURGERY Right    ICD IMPLANT N/A 01/06/2021   Procedure: ICD IMPLANT;  Surgeon: Constance Haw, MD;  Location: [place] [facility];  Service: Cardiovascular;  Laterality: N/A;   LEFT HEART CATH AND CORONARY ANGIOGRAPHY N/A 01/05/2021   Procedure: LEFT HEART CATH AND CORONARY ANGIOGRAPHY;  Surgeon: Lorretta Harp, MD;  Location: [place] [facility];  Service: Cardiovascular;  Laterality: N/A;   POLYPECTOMY  10/27/2017   Procedure: POLYPECTOMY;  Surgeon: Mauri Pole, MD;  Location: [facility];  Service: Endoscopy;;    Current Medications: Current Meds  Medication Sig   ANORO ELLIPTA 62.5-25 MCG/INH AEPB INHALE ONE PUFFS INTO THE LUNGS DAILY   atorvastatin (LIPITOR) 40 MG tablet Take 1 tablet (40 mg total) by mouth daily.   carvedilol (COREG) 12.5 MG tablet TAKE ONE TABLET BY MOUTH TWICE A DAY WITH MEALS    Coenzyme Q10 (CO Q-10) 200 MG CAPS Take 200 mg by mouth daily.   digoxin (LANOXIN) 0.125 MG tablet TAKE ONE TABLET BY MOUTH DAILY   fexofenadine (ALLEGRA) 180 MG tablet Take 180 mg by mouth daily.   furosemide (LASIX) 40 MG tablet TAKE ONE TABLET BY MOUTH DAILY   gabapentin (NEURONTIN) 300 MG capsule Take 600 mg by mouth at bedtime as needed (carpal tunnel).   LevOCARNitine (CARNITINE PO) Take 500 mg by mouth daily.   meloxicam (MOBIC) 15 MG tablet Take 15 mg by mouth daily as needed for pain.   Multiple Vitamin (MULTIVITAMIN) tablet Take 1 tablet by mouth daily.   nitroGLYCERIN (NITROSTAT) 0.4 MG SL tablet Place 1 tablet (0.4 mg total) under the tongue every 5 (five) minutes as needed for chest pain.   NON FORMULARY Take 465 mg by mouth as needed (joint pain). HiActives Tart [place]   Omega-3 Fatty Acids (OMEGA 3 PO) Take 1 capsule by mouth daily.   sacubitril-valsartan (ENTRESTO) 24-26 MG Take 1 tablet by mouth 2 (two) times daily.   vitamin C (ASCORBIC ACID) 500 MG tablet Take 500 mg by mouth daily.   VITAMIN D, CHOLECALCIFEROL, PO Take 5,000 Units by mouth daily.   XARELTO 20 MG TABS tablet TAKE ONE TABLET BY MOUTH DAILY WITH SUPPER   [DISCONTINUED] dapagliflozin propanediol (FARXIGA) 10 MG TABS tablet Take 1 tablet (10 mg total) by mouth daily.     Allergies:   Apple, Soybean oil, Lisinopril, Other, Bean pod extract, Carrot [daucus carota], Grass extracts [gramineae pollens], Peach flavor, and Tree extract   Social History   Socioeconomic History   Marital status: Single    Spouse name: Not on file   Number of children: Not on file   Years of education: Not on file   Highest education level: Not on file  Occupational History   Not on file  Tobacco Use   Smoking status: Former    Types: Cigars   Smokeless tobacco: Never   Tobacco comments:    1 cigar per month states he stopped smoking  Vaping Use   Vaping Use: Never used  Substance and Sexual Activity   Alcohol use: No    Drug use: No    Comment: 1 per month...pt states he stopped smoking   Sexual activity: Not on file  Other Topics Concern   Not on file  Social History Narrative   Not on file   Social Determinants of Health   Financial Resource Strain: Not on file  Food Insecurity: Not on file  Transportation Needs: Not on file  Physical Activity: Not on file  Stress: Not on file  Social Connections: Not on file     Family History: The patient's family history includes Other in his father. There is no history of Coronary artery disease.  ROS:   Please see the history of present illness.     All other systems reviewed and are negative.  EKGs/Labs/Other Studies Reviewed:    The following studies were reviewed today:  12/2020: Diagnostic Dominance: Right  Recent Labs: 01/05/2021: ALT 95; B Natriuretic Peptide 626.1; TSH 1.322 01/07/2021: BUN 14; Creatinine, Ser 0.97; Hemoglobin 14.6; Platelets 148; Potassium 4.2; Sodium 136  Recent Lipid Panel    Component Value Date/Time   CHOL 134 01/05/2021 0456   CHOL 230 (H) 09/25/2018 1516   TRIG 109 01/05/2021 0456   HDL 51 01/05/2021 0456   HDL 57 09/25/2018 1516   CHOLHDL 2.6 01/05/2021 0456   VLDL 22 01/05/2021 0456   LDLCALC 61 01/05/2021 0456   LDLCALC 146 (H) 09/25/2018 1516     Risk Assessment/Calculations:              Physical Exam:    VS:  BP 100/60 (BP Location: Left Arm, Patient Position: Sitting, Cuff Size: Normal)    Pulse 60    Ht 5\' 8"  (1.727 m)    Wt 257 lb (116.6 kg)    BMI 39.08 kg/m     Wt Readings from Last 3 Encounters:  02/11/21 257 lb (116.6 kg)  01/05/21 260 lb (117.9 kg)  10/30/20 263 lb (119.3 kg)     GEN:  Well nourished, well developed in no acute distress HEENT: Normal NECK: No JVD; No carotid bruits LYMPHATICS: No lymphadenopathy CARDIAC: mildly irreg, no murmurs, no rubs, gallops RESPIRATORY:  Clear to auscultation without rales, wheezing or rhonchi  ABDOMEN: Soft, non-tender,  non-distended MUSCULOSKELETAL:  No edema; No deformity  SKIN: Warm and dry NEUROLOGIC:  Alert and oriented x 3 PSYCHIATRIC:  Normal affect   ASSESSMENT:    1. NSVT (nonsustained ventricular tachycardia) ([place])   2. Coronary artery disease involving native coronary artery of native heart without angina pectoris   3. Chronic systolic heart failure (H[facility])    PLAN:    In order of problems listed above:  NSVT (nonsustained ventricular tachycardia) ([place]) Defibrillator in place.  Appreciate EP.  He is now off of his amiodarone.  Continue to monitor.  Carvedilol  CAD (coronary artery disease) Bare-metal stent to LAD widely patent.  CTO of RCA previously unsuccessful with good left-to-right collaterals.  Circumflex disease/occlusion now noted.  No PCI targets.  Chronic systolic heart failure ([place]) Trouble with blood pressure, hypotension on goal-directed medical therapy.  We decreased his Entresto to 24/26.  Stopped his [place].  He was hesitant to take this medication worried about side effects.  Continue with digoxin carvedilol 12.5 twice a day.  Prior VT.  EF 25 to 30%.  NYHA class II-III symptoms.  Also on furosemide 40 mg a day.     45 minutes spent with hospital records, review labs, cath, ICD, EP notes   Medication Adjustments/Labs and Tests Ordered: Current medicines are reviewed at length with the patient today.  Concerns regarding medicines are outlined above.  No orders of the defined types were placed in this encounter.  No orders of the defined types were placed in this encounter.   Patient Instructions  Medication Instructions:  Your physician has recommended you make the following change in your medication:  REMOVED: Farxiga from your medication list   *If you need a refill on your cardiac medications before your next appointment, please call your pharmacy*   Lab Work: NONE If you have labs (blood work) drawn today and your tests are completely normal, you will  receive your results only by: [place] (if you have MyChart) OR A paper copy in the mail If you have any lab test that is abnormal or we need to change your treatment, we will call you to review  the results.   Testing/Procedures: NONE   Follow-Up: At [facility], you and your health needs are our priority.  As part of our continuing mission to provide you with exceptional heart care, we have created designated Provider Care Teams.  These Care Teams include your primary Cardiologist (physician) and Advanced Practice Providers (APPs -  Physician Assistants and Nurse Practitioners) who all work together to provide you with the care you need, when you need it.  We recommend signing up for the patient portal called "MyChart".  Sign up information is provided on this After Visit Summary.  MyChart is used to connect with patients for Virtual Visits (Telemedicine).  Patients are able to view lab/test results, encounter notes, upcoming appointments, etc.  Non-urgent messages can be sent to your provider as well.   To learn more about what you can do with MyChart, go to [email or web address].    Your next appointment:   3 month(s)  The format for your next appointment:   In Person  Provider:   [name], MD         Signed, [name], MD  02/11/2021 5:14 PM    [place]

## 2021-02-23 ENCOUNTER — Telehealth: Payer: Self-pay | Admitting: Cardiology

## 2021-02-23 NOTE — Telephone Encounter (Signed)
spoke with patient who is aware - I am not aware of any scribes working in the hospital.  Also advised Dr Wynonia Lawman has not worked in some time now however we do have an Joesph July, [place] who works with [place] at both here and in the hospital.  Pt states understanding.

## 2021-02-23 NOTE — Telephone Encounter (Signed)
° ° °  Pt would like for Pam and Dr. Marlou Porch know that he is talking about Dr. Curt Bears scribed not Dr. Oran Rein

## 2021-02-27 ENCOUNTER — Other Ambulatory Visit: Payer: Self-pay | Admitting: Cardiology

## 2021-03-20 ENCOUNTER — Telehealth: Payer: Self-pay | Admitting: Cardiology

## 2021-03-20 NOTE — Telephone Encounter (Signed)
Unsuccessful telephone encounter to patient per request to discuss medical alert bracelet for ICD. [employer] compliant VM message left requesting call back to [phone].

## 2021-03-20 NOTE — Telephone Encounter (Signed)
Patient wanted to know how he can get an ID bracelet or necklace to wear that shows he has an ICD.  Patient would like to talk to Dr. Macky Lower RN

## 2021-03-25 NOTE — Telephone Encounter (Signed)
Can discuss with patient at next scheduled apt with Dr. Curt Bears  ?

## 2021-03-26 ENCOUNTER — Telehealth: Payer: Self-pay

## 2021-03-26 NOTE — Telephone Encounter (Signed)
I let the patient know that he can go on [place] or just google to get the medical id bracelet of necklace.  I let him know that we do not make those. [employer] does not make those bracelet or necklace either.  The patient thanked me for my help. ?

## 2021-04-06 ENCOUNTER — Ambulatory Visit (INDEPENDENT_AMBULATORY_CARE_PROVIDER_SITE_OTHER): Payer: Medicare Other

## 2021-04-06 DIAGNOSIS — I255 Ischemic cardiomyopathy: Secondary | ICD-10-CM

## 2021-04-06 LAB — CUP PACEART REMOTE DEVICE CHECK
Battery Remaining Longevity: 113 mo
Battery Remaining Percentage: 95.5 %
Battery Voltage: 3.13 V
Brady Statistic RV Percent Paced: 2.8 %
Date Time Interrogation Session: 20230313030242
HighPow Impedance: 73 Ohm
Implantable Lead Implant Date: 20221213
Implantable Lead Location: 753860
Implantable Pulse Generator Implant Date: 20221213
Lead Channel Impedance Value: 510 Ohm
Lead Channel Pacing Threshold Amplitude: 0.5 V
Lead Channel Pacing Threshold Pulse Width: 0.5 ms
Lead Channel Sensing Intrinsic Amplitude: 11.4 mV
Lead Channel Setting Pacing Amplitude: 3.5 V
Lead Channel Setting Pacing Pulse Width: 0.5 ms
Lead Channel Setting Sensing Sensitivity: 0.5 mV
Pulse Gen Serial Number: 810054243

## 2021-04-17 NOTE — Progress Notes (Signed)
Remote ICD transmission.   

## 2021-04-30 ENCOUNTER — Encounter: Payer: Medicare Other | Admitting: Cardiology

## 2021-05-06 ENCOUNTER — Encounter: Payer: Self-pay | Admitting: Gastroenterology

## 2021-05-11 ENCOUNTER — Telehealth: Payer: Self-pay

## 2021-05-11 DIAGNOSIS — I472 Ventricular tachycardia, unspecified: Secondary | ICD-10-CM

## 2021-05-11 MED ORDER — CARVEDILOL 12.5 MG PO TABS
18.7500 mg | ORAL_TABLET | Freq: Two times a day (BID) | ORAL | 3 refills | Status: DC
Start: 2021-05-11 — End: 2021-11-23

## 2021-05-11 NOTE — Telephone Encounter (Signed)
[facility] alert for ATP therapy ?Events occurred 4/15 @ 17:52, EGM showing regular tachy rhythm HR 176, ATP delivered x2, rhythm unchanged HR 170 ?VT-1 detection 171 40 intervals ?Above event followed by two events labeled SVT longest duration 2hrs 47mn, similar morphology to above ?Route to triage ?LA ? ?Successful telephone encounter to patient who recalls a feeling of "gloom and doom" during this episode. States he became very hot and felt as if he was smothering. He took 1 SL nitro and laid down. Fell asleep and feeling had subsided when he awoke. Hx of NSVT and PAF. Confirmed patient is taking coreg 12.5 mg and Lanoxin 0.'125mg'$  daily. Discussed with Dr. CCurt Bearswho orders BMET/Mg/BMP, to increase coreg to 18.75 mg BID and to move up previously scheduled appointment 5/3 with R. UCharlcie Cradle Patient advised and declines appointment with Dr. CCurt Bearstomorrow. Patient states he does not drive and transportation has to be scheduled 3 days in advance. Advised patient that Labs could be obtained anytime and he could schedule transportation today for labs on Thursday or Friday. Patient declines as it is "too much hassall". Patient declines sooner appointment with provider and will keep appointment with R. UCharlcie Cradle5/3. Prescription sent to pharmacy on file as they deliver. Labs placed. ED precautions strongly reinforced along with shock plan. Patient does not drive. Patient advised to call 911 if he experiences symptoms as described above.  ? ? ? ? ? ? ? ? ? ? ? ? ? ? ? ? ? ? ? ? ? ?

## 2021-05-12 ENCOUNTER — Telehealth: Payer: Self-pay | Admitting: Cardiology

## 2021-05-12 NOTE — Telephone Encounter (Signed)
Patient is calling stating he saw on the news if you have heart failure as well as other symptoms he could not remember you should discuss ATTR-CM with your cardiologist. Patient is requesting a callback to discuss if it is possible he could have this. Please advise.  ?

## 2021-05-12 NOTE — Telephone Encounter (Signed)
Will forward to Dr Marlou Porch for review ./cy ?

## 2021-05-14 NOTE — Telephone Encounter (Signed)
Jerline Pain, MD  You; Devra Dopp E, LPN 16 hours ago (0:12 PM)  ? ?Thankfully, you do not have amyloid heart based upon your prior heart studies.  ? ?[name], MD   ? ? ?Pt is aware of information above.  He had no further questions at the time of the call. ?

## 2021-05-24 NOTE — Progress Notes (Signed)
? ?Cardiology Office Note ?Date:  05/27/2021  ?Patient ID:  Norman Rodriguez February 11, 1955, MRN [id number] ?PCP:  Bonnita Hollow, MD  ?Cardiologist:  Dr. Marlou Porch ?Electrophysiologist: Dr. Lovena Le ? ?  ?Chief Complaint:  91 day ? ?History of Present Illness: ?Norman Rodriguez is a 65 y.o. male with history of tachy induced CM, CAD s/p PCI to LAD, COPD, HTN, and permanent atrial fibrillation having failed amiodarone and tikosyn, VT, ICD ? ?He was admitted 01/05/21 with CP, EMS found him in VT, started on amiodarone, cath that day with noted CAD but unchanged, reduced LVEF 25-35% (known for him) and underwent ICD implant. ?He was also diuresed, and discharged 01/07/22, not on amio/AAD. ? ?He saw Dr. Marlou Porch 02/11/21, doing well without recurrent symptoms. ? ?Device clinic noted alert for VT/ATP tx, also noted failed ATP and long VT episode, pt declined (or unable) to come in or go to the ER ? ?TODAY ?He reports the day that he was told that he had a abnormal heart rhythm he felt poorly ?Was aware of his heart pounding, gave him some discomfort, felt weak.  Just went to bed eventually and when he woke he felt better ?No further symptoms since then like that ?Occasionally he will feel a weird heart beat, it is a little unsettling but no symptoms ?No CP otherwise ?No near syncope or syncope. ? ?Instead of taking the core 1.5tab BID he initially tells me he is taking 1 pill TID then says he takes 2 in the AM and one in PM. ? ?No bleeding or signs of bleeding ? ?He does not drive ? ? ?Device information ?[employer] single chamber ICD implanted 01/06/22 ?Secondary prevention ? ? ?Past Medical History:  ?Diagnosis Date  ? Arthritis   ? Atrial fibrillation (Norman Rodriguez)   ? a. Dx 2010. b. Initiated on Tikosyn 03/2013.  ? CAD (coronary artery disease)   ? a. BMS 2.48m 23 to LAD. CTO of RCA unsuccessful, good left to right collaterals - 2010.  ? Chronic anticoagulation   ? Chronic systolic CHF (congestive heart failure) ([place]   ? a. Both  ischemic and tachy induced. b. Echo 04/2013: EF 30-35%, no RWMA, normal RV, mod dilated LA (before restoration of NSR).  ? COPD (chronic obstructive pulmonary disease) (Norman Rodriguez  ? Dysrhythmia   ? hx of atrial fibrilation  ? HTN (hypertension)   ? Hyperlipidemia   ? NSVT (nonsustained ventricular tachycardia) (Norman Rodriguez   ? Obesity   ? Shortness of breath   ? ? ?Past Surgical History:  ?Procedure Laterality Date  ? CARDIOVERSION N/A 04/27/2013  ? Procedure: BEDSIDE CARDIOVERSION;  Surgeon: MSanda Klein MD;  [place]  Service: Cardiovascular;  Laterality: N/A;  ? CARDIOVERSION N/A 05/15/2013  ? Procedure: CARDIOVERSION/BEDSIDE;  Surgeon: DLarey Dresser MD;  [place]  Service: Cardiovascular;  Laterality: N/A;  ? CARDIOVERSION N/A 05/16/2014  ? Procedure: CARDIOVERSION;  Surgeon: MJerline Pain MD;  [place] [place]  Service: Cardiovascular;  Laterality: N/A;  ? CARDIOVERSION N/A 06/07/2014  ? Procedure: CARDIOVERSION;  Surgeon: MJerline Pain MD;  [place]  Service: Cardiovascular;  Laterality: N/A;  ? COLONOSCOPY WITH PROPOFOL N/A 10/27/2017  ? Procedure: COLONOSCOPY WITH PROPOFOL;  Surgeon: NMauri Pole MD;  [facility];  Service: Endoscopy;  Laterality: N/A;  ? CORONARY ANGIOPLASTY WITH STENT PLACEMENT  2010  ? BMS 2.553m23 to LAD. CTO of RCA unsuccessful, good left to right collaterals  ? ESOPHAGOGASTRODUODENOSCOPY (EGD) WITH PROPOFOL N/A  10/24/2017  ? Procedure: ESOPHAGOGASTRODUODENOSCOPY (EGD) WITH PROPOFOL;  Surgeon: Mauri Pole, MD;  [facility];  Service: Endoscopy;  Laterality: N/A;  ? FOOT SURGERY Right   ? ICD IMPLANT N/A 01/06/2021  ? Procedure: ICD IMPLANT;  Surgeon: Constance Haw, MD;  Location: [place] [facility];  Service: Cardiovascular;  Laterality: N/A;  ? LEFT HEART CATH AND CORONARY ANGIOGRAPHY N/A 01/05/2021  ? Procedure: LEFT HEART CATH AND CORONARY ANGIOGRAPHY;  Surgeon: Lorretta Harp, MD;  Location: [place] [facility];  Service: Cardiovascular;  Laterality: N/A;  ? POLYPECTOMY  10/27/2017  ? Procedure: POLYPECTOMY;  Surgeon: Mauri Pole, MD;  [facility];  Service: Endoscopy;;  ? ? ?Current Outpatient Medications  ?Medication Sig Dispense Refill  ? ANORO ELLIPTA 62.5-25 MCG/INH AEPB INHALE ONE PUFFS INTO THE LUNGS DAILY 60 each 3  ? atorvastatin (LIPITOR) 40 MG tablet Take 1 tablet (40 mg total) by mouth daily. 90 tablet 3  ? carvedilol (COREG) 12.5 MG tablet Take 1.5 tablets (18.75 mg total) by mouth 2 (two) times daily. 270 tablet 3  ? Coenzyme Q10 (CO Q-10) 200 MG CAPS Take 200 mg by mouth daily.    ? digoxin (LANOXIN) 0.125 MG tablet TAKE ONE TABLET BY MOUTH DAILY 90 tablet 3  ? fexofenadine (ALLEGRA) 180 MG tablet Take 180 mg by mouth daily.    ? furosemide (LASIX) 40 MG tablet TAKE ONE TABLET BY MOUTH DAILY 90 tablet 2  ? gabapentin (NEURONTIN) 300 MG capsule Take 600 mg by mouth at bedtime as needed (carpal tunnel).    ? LevOCARNitine (CARNITINE PO) Take 500 mg by mouth daily.    ? meloxicam (MOBIC) 15 MG tablet Take 15 mg by mouth daily as needed for pain.    ? Multiple Vitamin (MULTIVITAMIN) tablet Take 1 tablet by mouth daily.    ? nitroGLYCERIN (NITROSTAT) 0.4 MG SL tablet Place 1 tablet (0.4 mg total) under the tongue every 5 (five) minutes as needed for chest pain. 25 tablet 4  ? NON FORMULARY Take 465 mg by mouth as needed (joint pain). [place]    ? Omega-3 Fatty Acids (OMEGA 3 PO) Take 1 capsule by mouth daily.    ? sacubitril-valsartan (ENTRESTO) 24-26 MG Take 1 tablet by mouth 2 (two) times daily. 60 tablet 6  ? vitamin C (ASCORBIC ACID) 500 MG tablet Take 500 mg by mouth daily.    ? VITAMIN D, CHOLECALCIFEROL, PO Take 5,000 Units by mouth daily.    ? XARELTO 20 MG TABS tablet TAKE ONE TABLET BY MOUTH DAILY WITH SUPPER 90 tablet 0  ? ?No current facility-administered medications for this visit.  ? ? ?Allergies:   Apple juice, Soybean oil, Lisinopril, Other, Bean pod  extract, Carrot [daucus carota], Grass extracts [gramineae pollens], Peach flavor, and Tree extract  ? ?Social History:  The patient  reports that he has quit smoking. His smoking use included cigars. He has never used smokeless tobacco. He reports that he does not drink alcohol and does not use drugs.  ? ?Family History:  The patient's family history includes Other in his father. ? ?ROS:  Please see the history of present illness.    ?All other systems are reviewed and otherwise negative.  ? ?PHYSICAL EXAM:  ?VS:  BP 105/67   Pulse 65   Ht '5\' 8"'$  (1.727 m)   Wt 260 lb (117.9 kg)   SpO2 96%   BMI 39.53 kg/m?  BMI: Body mass index is  39.53 kg/m?. ?Well nourished, well developed, in no acute distress ?HEENT: normocephalic, atraumatic ?Neck: no JVD, carotid bruits or masses ?Cardiac:  RRR; no significant murmurs, no rubs, or gallops ?Lungs:  CTA b/l, no wheezing, rhonchi or rales ?Abd: soft, nontender, appears to have a hernia, he says is from an [place] years ago ?MS: no deformity or atrophy ?Ext: trace edema R>L, (pt reports RLE always swollen 2/2 an old injury) ?Skin: warm and dry, no rash ?Neuro:  No gross deficits appreciated ?Psych: euthymic mood, full affect ? ?ICD site is stable, well healed no tethering or discomfort ? ? ?EKG: done today and reviewed by myself ?AFib 73bpm, PVC, no acute looking changes ? ?Device interrogation done today and reviewed by myself:  ?Battery and lead measurements are good ?RV output reduced to 2.5V, chronic output. ?VTs noted ?02/22/21:one true  NSVT ?04/23/21: labeled NSVT is a brief AFib w/RVR ?05/09/21: ?VT episode, true VT, ATP x2 unsuccessful but slowed to hover around/below detection rate and declared over, though was still in VT ?Mis-labeled SVT is VT, based on onset and stability, note also seemed to be in/out detection rate for some beats ?Mis-labeled SVT was VT, based on onset, we do see spontaneous conversion at the end of this episode, duration 147 minutes ?05/12/21:  NSVT ? ?Reviewed with Dr. Caryl Comes with him, made the following therapy adjustments ?% match reduced to 80% ?Tachy criteria changed from "if all" to if "2of3" ?Detection rate reduced to 160 (355m) ?ATP therapy increased

## 2021-05-26 ENCOUNTER — Ambulatory Visit: Payer: Medicare Other | Admitting: Family Medicine

## 2021-05-27 ENCOUNTER — Other Ambulatory Visit: Payer: Self-pay

## 2021-05-27 ENCOUNTER — Ambulatory Visit (INDEPENDENT_AMBULATORY_CARE_PROVIDER_SITE_OTHER): Payer: Medicare Other | Admitting: Physician Assistant

## 2021-05-27 ENCOUNTER — Encounter: Payer: Self-pay | Admitting: Physician Assistant

## 2021-05-27 VITALS — BP 105/67 | HR 65 | Ht 68.0 in | Wt 260.0 lb

## 2021-05-27 DIAGNOSIS — I480 Paroxysmal atrial fibrillation: Secondary | ICD-10-CM | POA: Diagnosis not present

## 2021-05-27 DIAGNOSIS — I472 Ventricular tachycardia, unspecified: Secondary | ICD-10-CM

## 2021-05-27 DIAGNOSIS — Z79899 Other long term (current) drug therapy: Secondary | ICD-10-CM | POA: Diagnosis not present

## 2021-05-27 DIAGNOSIS — I5022 Chronic systolic (congestive) heart failure: Secondary | ICD-10-CM

## 2021-05-27 DIAGNOSIS — Z95810 Presence of automatic (implantable) cardiac defibrillator: Secondary | ICD-10-CM

## 2021-05-27 DIAGNOSIS — I2510 Atherosclerotic heart disease of native coronary artery without angina pectoris: Secondary | ICD-10-CM

## 2021-05-27 DIAGNOSIS — R079 Chest pain, unspecified: Secondary | ICD-10-CM | POA: Diagnosis not present

## 2021-05-27 LAB — CUP PACEART INCLINIC DEVICE CHECK
Date Time Interrogation Session: 20230503175148
Implantable Lead Implant Date: 20221213
Implantable Lead Location: 753860
Implantable Pulse Generator Implant Date: 20221213
Lead Channel Pacing Threshold Amplitude: 0.5 V
Lead Channel Pacing Threshold Pulse Width: 0.5 ms
Lead Channel Sensing Intrinsic Amplitude: 12 mV
Pulse Gen Serial Number: 810054243

## 2021-05-27 LAB — COMPREHENSIVE METABOLIC PANEL
ALT: 36 IU/L (ref 0–44)
AST: 37 IU/L (ref 0–40)
Albumin/Globulin Ratio: 1.4 (ref 1.2–2.2)
Albumin: 4.2 g/dL (ref 3.8–4.8)
Alkaline Phosphatase: 58 IU/L (ref 44–121)
BUN/Creatinine Ratio: 11 (ref 10–24)
BUN: 10 mg/dL (ref 8–27)
Bilirubin Total: 0.9 mg/dL (ref 0.0–1.2)
CO2: 22 mmol/L (ref 20–29)
Calcium: 9.2 mg/dL (ref 8.6–10.2)
Chloride: 104 mmol/L (ref 96–106)
Creatinine, Ser: 0.89 mg/dL (ref 0.76–1.27)
Globulin, Total: 2.9 g/dL (ref 1.5–4.5)
Glucose: 114 mg/dL — ABNORMAL HIGH (ref 70–99)
Potassium: 4.8 mmol/L (ref 3.5–5.2)
Sodium: 138 mmol/L (ref 134–144)
Total Protein: 7.1 g/dL (ref 6.0–8.5)
eGFR: 93 mL/min/{1.73_m2} (ref >59)

## 2021-05-27 LAB — MAGNESIUM: Magnesium: 2.1 mg/dL (ref 1.6–2.3)

## 2021-05-27 NOTE — Patient Instructions (Signed)
Medication Instructions:  ? ? ?Your physician recommends that you continue on your current medications as directed. Please refer to the Current Medication list given to you today. ? ?(MAKE SURE YOU HOLD DIGOXIN ON DAY OF LAB AND TAKE AFTER ) ? ?*If you need a refill on your cardiac medications before your next appointment, please call your pharmacy* ? ? ?Lab Work:  CMET AND MAG TODAY  ? ?RETURN FOR  DIG LAB ON SAME DAY AS DR CAMNITZ    ? ? ?If you have labs (blood work) drawn today and your tests are completely normal, you will receive your results only by: ?MyChart Message (if you have MyChart) OR ?A paper copy in the mail ?If you have any lab test that is abnormal or we need to change your treatment, we will call you to review the results. ? ? ?Testing/Procedures:  NONE ORDERED  TODAY ? ? ?Follow-Up: ?At [facility], you and your health needs are our priority.  As part of our continuing mission to provide you with exceptional heart care, we have created designated Provider Care Teams.  These Care Teams include your primary Cardiologist (physician) and Advanced Practice Providers (APPs -  Physician Assistants and Nurse Practitioners) who all work together to provide you with the care you need, when you need it. ? ?We recommend signing up for the patient portal called "MyChart".  Sign up information is provided on this After Visit Summary.  MyChart is used to connect with patients for Virtual Visits (Telemedicine).  Patients are able to view lab/test results, encounter notes, upcoming appointments, etc.  Non-urgent messages can be sent to your provider as well.   ?To learn more about what you can do with MyChart, go to [email or web address].   ? ?Your next appointment:   ?3 month(s) ? ?The format for your next appointment:   ?In Person ? ?Provider:   ?You may see  Dr Curt Bears  ? ? ?Other Instructions ? ? ?Important Information About Sugar ? ? ? ? ?  ?

## 2021-05-28 ENCOUNTER — Ambulatory Visit: Payer: Medicare Other | Admitting: Cardiology

## 2021-06-01 ENCOUNTER — Encounter: Payer: Self-pay | Admitting: Family Medicine

## 2021-06-01 ENCOUNTER — Ambulatory Visit (INDEPENDENT_AMBULATORY_CARE_PROVIDER_SITE_OTHER): Payer: 59 | Admitting: Family Medicine

## 2021-06-01 VITALS — BP 148/78 | HR 86 | Temp 97.1°F | Ht 68.0 in | Wt 259.4 lb

## 2021-06-01 DIAGNOSIS — Z23 Encounter for immunization: Secondary | ICD-10-CM

## 2021-06-01 DIAGNOSIS — Z5982 Transportation insecurity: Secondary | ICD-10-CM | POA: Diagnosis not present

## 2021-06-01 DIAGNOSIS — M19171 Post-traumatic osteoarthritis, right ankle and foot: Secondary | ICD-10-CM

## 2021-06-01 DIAGNOSIS — M79675 Pain in left toe(s): Secondary | ICD-10-CM

## 2021-06-01 DIAGNOSIS — J449 Chronic obstructive pulmonary disease, unspecified: Secondary | ICD-10-CM

## 2021-06-01 DIAGNOSIS — G5602 Carpal tunnel syndrome, left upper limb: Secondary | ICD-10-CM | POA: Diagnosis not present

## 2021-06-01 DIAGNOSIS — I2510 Atherosclerotic heart disease of native coronary artery without angina pectoris: Secondary | ICD-10-CM

## 2021-06-01 DIAGNOSIS — M19111 Post-traumatic osteoarthritis, right shoulder: Secondary | ICD-10-CM

## 2021-06-01 DIAGNOSIS — I10 Essential (primary) hypertension: Secondary | ICD-10-CM

## 2021-06-01 DIAGNOSIS — E785 Hyperlipidemia, unspecified: Secondary | ICD-10-CM

## 2021-06-01 DIAGNOSIS — M153 Secondary multiple arthritis: Secondary | ICD-10-CM | POA: Insufficient documentation

## 2021-06-01 DIAGNOSIS — Z6839 Body mass index (BMI) 39.0-39.9, adult: Secondary | ICD-10-CM

## 2021-06-01 MED ORDER — GABAPENTIN 300 MG PO CAPS: 600.0000 mg | ORAL_CAPSULE | Freq: Every day | ORAL | 2 refills | Status: DC

## 2021-06-01 MED ORDER — ACETAMINOPHEN 500 MG PO PACK: 1.0000 | PACK | Freq: Three times a day (TID) | ORAL | 0 refills | Status: DC | PRN

## 2021-06-01 NOTE — Assessment & Plan Note (Signed)
Stable on Anoro Ellipta ?

## 2021-06-01 NOTE — Progress Notes (Signed)
? ?New Patient Office Visit ? ?Subjective   ? ?Patient ID: Norman Rodriguez, male    DOB: 04-29-1955  Age: 66 y.o. MRN: [id number] ? ?CC:  ?Chief Complaint  ?Patient presents with  ? Establish Care  ?  Est Care, looking for a new provider. ?Needs a Gabapentin & Meloxicam Rx. ?Concerns of Carpel Tunnel in left hand. ?Patient not fasting. ?Had a fall 3 days ago and scrapped his left arm and left pinky toe is now black. ?Encouraged pt to get vaccines at pharmacy due to insurance.  ? ? ?HPI ?Norman Rodriguez presents to establish care.  ? ?Mechanical fall at a few days ago, slipped hurt left fifth digit toe and left arm.  ? ?Patient has significant cardiovascular history including CAD, VT, HFrEF with ICD D, PAF on anticoagulation.  Last saw cardiology on 05/27/2021.  He follows with [facility] cardiology. ? ?Labs taken at recent cardiology appointment showed elevated glucose but electrolytes were normal, also normal magnesium.  Lipid from 12/2020 was with LDL less than 70. ? ?Patient also with history of hypertension and hyperlipidemia. ? ?Endorses some mild shortness of breath associated with COPD, however this is well controlled on Anoro Ellipta. ? ?Patient with extensive orthopedic injuries status post fall from "[place]".  This includes right ankle surgery and left shoulder surgery from several years ago. ? ?Patient has left hand numbness this is getting progressively worse, affecting his ability to grip.  He said the pain was improved with gabapentin.  He was also taking Mobic, but that this was not helping. ? ? ? ? ?Outpatient Encounter Medications as of 06/01/2021  ?Medication Sig  ? Acetaminophen 500 MG PACK Take 1-2 tablets by mouth 3 (three) times daily as needed.  ? ANORO ELLIPTA 62.5-25 MCG/INH AEPB INHALE ONE PUFFS INTO THE LUNGS DAILY  ? atorvastatin (LIPITOR) 40 MG tablet Take 1 tablet (40 mg total) by mouth daily.  ? carvedilol (COREG) 12.5 MG tablet Take 1.5 tablets (18.75 mg total) by mouth 2 (two) times daily.   ? Coenzyme Q10 (CO Q-10) 200 MG CAPS Take 200 mg by mouth daily.  ? digoxin (LANOXIN) 0.125 MG tablet TAKE ONE TABLET BY MOUTH DAILY  ? fexofenadine (ALLEGRA) 180 MG tablet Take 180 mg by mouth daily.  ? furosemide (LASIX) 40 MG tablet TAKE ONE TABLET BY MOUTH DAILY  ? LevOCARNitine (CARNITINE PO) Take 500 mg by mouth daily.  ? Multiple Vitamin (MULTIVITAMIN) tablet Take 1 tablet by mouth daily.  ? nitroGLYCERIN (NITROSTAT) 0.4 MG SL tablet Place 1 tablet (0.4 mg total) under the tongue every 5 (five) minutes as needed for chest pain.  ? NON FORMULARY Take 465 mg by mouth as needed (joint pain). [place]  ? Omega-3 Fatty Acids (OMEGA 3 PO) Take 1 capsule by mouth daily.  ? sacubitril-valsartan (ENTRESTO) 24-26 MG Take 1 tablet by mouth 2 (two) times daily.  ? vitamin C (ASCORBIC ACID) 500 MG tablet Take 500 mg by mouth daily.  ? VITAMIN D, CHOLECALCIFEROL, PO Take 5,000 Units by mouth daily.  ? XARELTO 20 MG TABS tablet TAKE ONE TABLET BY MOUTH DAILY WITH SUPPER  ? [DISCONTINUED] gabapentin (NEURONTIN) 300 MG capsule Take 600 mg by mouth at bedtime as needed (carpal tunnel).  ? [DISCONTINUED] meloxicam (MOBIC) 15 MG tablet Take 15 mg by mouth daily as needed for pain.  ? gabapentin (NEURONTIN) 300 MG capsule Take 2 capsules (600 mg total) by mouth at bedtime.  ? ?No facility-administered encounter medications on file  as of 06/01/2021.  ? ? ?Past Medical History:  ?Diagnosis Date  ? ACS (acute coronary syndrome) ([place])   ? Arthritis   ? Atrial fibrillation ([place])   ? a. Dx 2010. b. Initiated on Tikosyn 03/2013.  ? BRBPR (bright red blood per rectum) 10/22/2017  ? CAD (coronary artery disease)   ? a. BMS 2.71m 23 to LAD. CTO of RCA unsuccessful, good left to right collaterals - 2010.  ? Chronic anticoagulation   ? Chronic systolic CHF (congestive heart failure) ([place]   ? a. Both ischemic and tachy induced. b. Echo 04/2013: EF 30-35%, no RWMA, normal RV, mod dilated LA (before restoration of NSR).  ? COPD  (ch[place] obstructive pulmonary disease) ([place] 10/22/2017  ? Dysrhythmia   ? hx of atrial fibrilation  ? HTN (hypertension)   ? Hyperlipidemia   ? Melena   ? NSTEMI (non-ST elevated myocardial infarction) ([place] 01/05/2021  ? NSVT (nonsustained ventricular tachycardia) ([place]   ? Obesity   ? Polyp of cecum   ? Polyp of descending colon   ? Polyp of rectum   ? Polyp of transverse colon   ? Shortness of breath   ? ? ?Past Surgical History:  ?Procedure Laterality Date  ? CARDIOVERSION N/A 04/27/2013  ? Procedure: BEDSIDE CARDIOVERSION;  Surgeon: MSanda Klein MD;  [place]  Service: Cardiovascular;  Laterality: N/A;  ? CARDIOVERSION N/A 05/15/2013  ? Procedure: CARDIOVERSION/BEDSIDE;  Surgeon: DLarey Dresser MD;  [place] [facility]  Service: Cardiovascular;  Laterality: N/A;  ? CARDIOVERSION N/A 05/16/2014  ? Procedure: CARDIOVERSION;  Surgeon: MJerline Pain MD;  [place]  Service: Cardiovascular;  Laterality: N/A;  ? CARDIOVERSION N/A 06/07/2014  ? Procedure: CARDIOVERSION;  Surgeon: MJerline Pain MD;  [place]  Service: Cardiovascular;  Laterality: N/A;  ? COLONOSCOPY WITH PROPOFOL N/A 10/27/2017  ? Procedure: COLONOSCOPY WITH PROPOFOL;  Surgeon: NMauri Pole MD;  [facility];  Service: Endoscopy;  Laterality: N/A;  ? CORONARY ANGIOPLASTY WITH STENT PLACEMENT  2010  ? BMS 2.572m23 to LAD. CTO of RCA unsuccessful, good left to right collaterals  ? ESOPHAGOGASTRODUODENOSCOPY (EGD) WITH PROPOFOL N/A 10/24/2017  ? Procedure: ESOPHAGOGASTRODUODENOSCOPY (EGD) WITH PROPOFOL;  Surgeon: NaMauri PoleMD;  [facility];  Service: Endoscopy;  Laterality: N/A;  ? FOOT SURGERY Right   ? ICD IMPLANT N/A 01/06/2021  ? Procedure: ICD IMPLANT;  Surgeon: CaConstance HawMD;  [place] [place];  Service: Cardiovascular;  Laterality: N/A;  ? LEFT HEART CATH AND CORONARY ANGIOGRAPHY N/A 01/05/2021  ? Procedure: LEFT HEART CATH AND CORONARY ANGIOGRAPHY;   Surgeon: BeLorretta HarpMD;  [place];  Service: Cardiovascular;  Laterality: N/A;  ? POLYPECTOMY  10/27/2017  ? Procedure: POLYPECTOMY;  Surgeon: NaMauri PoleMD;  [facility];  Service: Endoscopy;;  ? ? ?Family History  ?Problem Relation Age of Onset  ? Other Father   ?     Car accident  ? Coronary artery disease Neg Hx   ? ? ?Social History  ? ?Socioeconomic History  ? Marital status: Single  ?  Spouse name: Not on file  ? Number of children: Not on file  ? Years of education: Not on file  ? Highest education level: Not on file  ?Occupational History  ? Not on file  ?Tobacco Use  ? Smoking status: Former  ?  Types: Cigars  ? Smokeless tobacco: Never  ? Tobacco comments:  ?  1 cigar per month states he stopped smoking  ?Vaping Use  ? Vaping Use: Never used  ?Substance and Sexual Activity  ? Alcohol use: No  ? Drug use: No  ?  Comment: 1 per month...pt states he stopped smoking  ? Sexual activity: Not Currently  ?Other Topics Concern  ? Not on file  ?Social History Narrative  ? Not on file  ? ?Social Determinants of Health  ? ?Financial Resource Strain: Not on file  ?Food Insecurity: Not on file  ?Transportation Needs: Not on file  ?Physical Activity: Not on file  ?Stress: Not on file  ?Social Connections: Not on file  ?Intimate Partner Violence: Not on file  ? ? ?Review of Systems  ?Respiratory:  Positive for shortness of breath (mild but improved on inhaler).   ?Cardiovascular:  Negative for chest pain.  ?All other systems reviewed and are negative. ? ?  ? ? ?Objective   ? ?BP (!) 148/78 (BP Location: Left Arm, Patient Position: Sitting, Cuff Size: Normal)   Pulse 86   Temp (!) 97.1 ?F (36.2 ?C) (Temporal)   Ht '5\' 8"'$  (1.727 m)   Wt 259 lb 6.4 oz (117.7 kg)   SpO2 100%   BMI 39.44 kg/m?  ? ?Gen: NAD, resting comfortably ?CV: RRR with no murmurs appreciated ?Pulm: NWOB, CTAB with no crackles, wheezes, or rhonchi ?GI: Normal bowel sounds present. Soft, Nontender,  Nondistended. ?MSK: Large surgical scar on right ankle well-healed, left pinky toe mildly contused, normal flexion and extension in his toe, not tender to palpation ?Skin: warm, dry ?Neuro: grossly normal, moves all e

## 2021-06-01 NOTE — Assessment & Plan Note (Addendum)
Chronic pain status post trauma and orthopedic reconstruction in right ankle and left shoulder ?On Mobic, however will discontinue due to cardiotoxicity and anticoagulation ?Trial Tylenol ?

## 2021-06-01 NOTE — Assessment & Plan Note (Signed)
Elevated today, patient on carvedilol and Entresto ?Asymptomatic ?Attempted recheck, however declined ?

## 2021-06-01 NOTE — Assessment & Plan Note (Signed)
Chronic, worsening ?Refill gabapentin for 6 months, add Tylenol ?Refer to orthopedics ?

## 2021-06-01 NOTE — Assessment & Plan Note (Signed)
Follows with cardiology ?Anticoagulated with Xarelto ?On atorvastatin and carvedilol for medical management ?Most recently seen on 05/2021, no encasings are stable ?

## 2021-06-01 NOTE — Patient Instructions (Signed)
It was a pleasure to meet you today. ? ? ?We are sending in acetaminophen for joint pain. ? ?We are referring you to orthopedics for your left hand carpal tunnel.  We are also refilling your gabapentin ?

## 2021-06-01 NOTE — Assessment & Plan Note (Signed)
Continue atorvastatin

## 2021-06-01 NOTE — Assessment & Plan Note (Signed)
Encouraged diet and exercise.  

## 2021-06-01 NOTE — Assessment & Plan Note (Signed)
Status post mechanical fall ?Trial Tylenol for pain management ?

## 2021-06-02 ENCOUNTER — Telehealth: Payer: Self-pay | Admitting: Family Medicine

## 2021-06-02 NOTE — Telephone Encounter (Signed)
Pt was reading over his AVS and he wants this corrected under  ?Problem List Items Addressed This Visit  ?Musculoskeletal and Integument ?Post-traumatic osteoarthritis of right shoulder ? ?It should say his Left shoulder ? ?If any questions please advise pt at [phone] ? ? ? ?

## 2021-06-08 ENCOUNTER — Telehealth: Payer: Self-pay | Admitting: Family Medicine

## 2021-06-08 NOTE — Telephone Encounter (Signed)
Pt was here for his left toe which he injured and forgot to ask about his right toe. He has fungus of the nail. He would like something called in for it. He has not been able to get anything over the counter that works. ?Please advise. ?

## 2021-06-09 NOTE — Telephone Encounter (Signed)
Called and spoke to patient. Informed patient of provider instructions and comments. Pt verbalized understanding. As, cma  ?

## 2021-06-09 NOTE — Telephone Encounter (Signed)
As said earlier, patient will need to follow up to discuss

## 2021-06-09 NOTE — Telephone Encounter (Signed)
As discussed before patient needs to follow up to be seen

## 2021-06-09 NOTE — Telephone Encounter (Signed)
Pt is wanting a script for this issue CICLOPIROX. He would like it sent to ? ?[place], [place] -  ?[place], [place] [address]  ?Phone:  [phone]  Fax:  [phone]  ?

## 2021-06-30 ENCOUNTER — Other Ambulatory Visit: Payer: Self-pay | Admitting: Cardiology

## 2021-07-06 ENCOUNTER — Ambulatory Visit (INDEPENDENT_AMBULATORY_CARE_PROVIDER_SITE_OTHER): Payer: Medicare Other

## 2021-07-06 DIAGNOSIS — I480 Paroxysmal atrial fibrillation: Secondary | ICD-10-CM | POA: Diagnosis not present

## 2021-07-07 LAB — CUP PACEART REMOTE DEVICE CHECK
Battery Remaining Longevity: 119 mo
Battery Remaining Percentage: 95 %
Battery Voltage: 3.05 V
Brady Statistic RV Percent Paced: 1.3 %
Date Time Interrogation Session: 20230612020313
HighPow Impedance: 71 Ohm
Implantable Lead Implant Date: 20221213
Implantable Lead Location: 753860
Implantable Pulse Generator Implant Date: 20221213
Lead Channel Impedance Value: 500 Ohm
Lead Channel Pacing Threshold Amplitude: 0.5 V
Lead Channel Pacing Threshold Pulse Width: 0.5 ms
Lead Channel Sensing Intrinsic Amplitude: 8.4 mV
Lead Channel Setting Pacing Amplitude: 2.5 V
Lead Channel Setting Pacing Pulse Width: 0.5 ms
Lead Channel Setting Sensing Sensitivity: 0.5 mV
Pulse Gen Serial Number: 810054243

## 2021-07-20 ENCOUNTER — Other Ambulatory Visit: Payer: Self-pay | Admitting: Cardiology

## 2021-07-21 MED ORDER — SACUBITRIL-VALSARTAN 24-26 MG PO TABS: 1.0000 | ORAL_TABLET | Freq: Two times a day (BID) | ORAL | 5 refills | Status: DC

## 2021-07-23 NOTE — Progress Notes (Addendum)
Remote ICD transmission.   

## 2021-08-18 ENCOUNTER — Encounter: Payer: Self-pay | Admitting: Cardiology

## 2021-08-18 ENCOUNTER — Ambulatory Visit (INDEPENDENT_AMBULATORY_CARE_PROVIDER_SITE_OTHER): Payer: Medicare Other | Admitting: Cardiology

## 2021-08-18 VITALS — BP 120/70 | HR 52 | Ht 68.0 in | Wt 261.0 lb

## 2021-08-18 DIAGNOSIS — I5022 Chronic systolic (congestive) heart failure: Secondary | ICD-10-CM

## 2021-08-18 DIAGNOSIS — I472 Ventricular tachycardia, unspecified: Secondary | ICD-10-CM

## 2021-08-18 DIAGNOSIS — I2510 Atherosclerotic heart disease of native coronary artery without angina pectoris: Secondary | ICD-10-CM | POA: Diagnosis not present

## 2021-08-18 DIAGNOSIS — I480 Paroxysmal atrial fibrillation: Secondary | ICD-10-CM

## 2021-08-18 NOTE — Patient Instructions (Addendum)
Medication Instructions:  Your physician recommends that you continue on your current medications as directed. Please refer to the Current Medication list given to you today.  *If you need a refill on your cardiac medications before your next appointment, please call your pharmacy*  Follow-Up: At [facility], you and your health needs are our priority.  As part of our continuing mission to provide you with exceptional heart care, we have created designated Provider Care Teams.  These Care Teams include your primary Cardiologist (physician) and Advanced Practice Providers (APPs -  Physician Assistants and Nurse Practitioners) who all work together to provide you with the care you need, when you need it.  Your next appointment:   6 month(s)  The format for your next appointment:   In Person  Provider:   [name], MD     Important Information About Sugar

## 2021-08-18 NOTE — Progress Notes (Signed)
Cardiology Office Note:    Date:  08/18/2021   ID:  Norman Rodriguez, DOB 29-Oct-1955, MRN [id number]  PCP:  Bonnita Hollow, MD   [facility] Providers Cardiologist:  [name], MD Electrophysiologist:  Cristopher Peru, MD     Referring MD: Loyola Mast, PA-C    History of Present Illness:    Norman Rodriguez is a 66 y.o. male with chronic systolic heart failure, ICD, prior VT here for follow-up.  Overall doing well.  Still getting shortness of breath with activity which is fairly common for him.  NYHA class II symptoms.  No syncope.  No recent shocks.  Reviewed EP note.  Settings changed on defibrillator.  Past Medical History:  Diagnosis Date   ACS (acute coronary syndrome) ([place])    Arthritis    Atrial fibrillation ([place])    a. Dx 2010. b. Initiated on Tikosyn 03/2013.   BRBPR (bright red blood per rectum) 10/22/2017   CAD (coronary artery disease)    a. BMS 2.94m 23 to LAD. CTO of RCA unsuccessful, good left to right collaterals - 2010.   Chronic anticoagulation    Chronic systolic CHF (congestive heart failure) ([place]    a. Both ischemic and tachy induced. b. Echo 04/2013: EF 30-35%, no RWMA, normal RV, mod dilated LA (before restoration of NSR).   COPD (chronic obstructive pulmonary disease) ([facility] 10/22/2017   Dysrhythmia    hx of atrial fibrilation   HTN (hypertension)    Hyperlipidemia    Melena    NSTEMI (non-ST elevated myocardial infarction) ([facility] 01/05/2021   NSVT (nonsustained ventricular tachycardia) (HCC)    Obesity    Polyp of cecum    Polyp of descending colon    Polyp of rectum    Polyp of transverse colon    Shortness of breath     Past Surgical History:  Procedure Laterality Date   CARDIOVERSION N/A 04/27/2013   Procedure: BEDSIDE CARDIOVERSION;  Surgeon: MSanda Klein MD;  [place]  Service: Cardiovascular;  Laterality: N/A;   CARDIOVERSION N/A 05/15/2013   Procedure: CARDIOVERSION/BEDSIDE;  Surgeon: DLarey Dresser MD;  [place]  Service: Cardiovascular;  Laterality: N/A;   CARDIOVERSION N/A 05/16/2014   Procedure: CARDIOVERSION;  Surgeon: MJerline Pain MD;  [place]  Service: Cardiovascular;  Laterality: N/A;   CARDIOVERSION N/A 06/07/2014   Procedure: CARDIOVERSION;  Surgeon: MJerline Pain MD;  [place]  Service: Cardiovascular;  Laterality: N/A;   COLONOSCOPY WITH PROPOFOL N/A 10/27/2017   Procedure: COLONOSCOPY WITH PROPOFOL;  Surgeon: NMauri Pole MD;  [facility];  Service: Endoscopy;  Laterality: N/A;   CORONARY ANGIOPLASTY WITH STENT PLACEMENT  2010   BMS 2.558m23 to LAD. CTO of RCA unsuccessful, good left to right collaterals   ESOPHAGOGASTRODUODENOSCOPY (EGD) WITH PROPOFOL N/A 10/24/2017   Procedure: ESOPHAGOGASTRODUODENOSCOPY (EGD) WITH PROPOFOL;  Surgeon: NaMauri PoleMD;  [facility];  Service: Endoscopy;  Laterality: N/A;   FOOT SURGERY Right    ICD IMPLANT N/A 01/06/2021   Procedure: ICD IMPLANT;  Surgeon: CaConstance HawMD;  [facility];  Service: Cardiovascular;  Laterality: N/A;   LEFT HEART CATH AND CORONARY ANGIOGRAPHY N/A 01/05/2021   Procedure: LEFT HEART CATH AND CORONARY ANGIOGRAPHY;  Surgeon: BeLorretta HarpMD;  [facility];  Service: Cardiovascular;  Laterality: N/A;   POLYPECTOMY  10/27/2017   Procedure: POLYPECTOMY;  Surgeon: NaMauri PoleMD;  [facility];  Service: Endoscopy;;    Current Medications: Current Meds  Medication Sig   Acetaminophen 500 MG PACK Take 1-2 tablets by mouth 3 (three) times daily as needed.   ANORO ELLIPTA 62.5-25 MCG/INH AEPB INHALE ONE PUFFS INTO THE LUNGS DAILY   atorvastatin (LIPITOR) 40 MG tablet Take 1 tablet (40 mg total) by mouth daily.   carvedilol (COREG) 12.5 MG tablet Take 1.5 tablets (18.75 mg total) by mouth 2 (two) times daily.   Coenzyme Q10 (CO Q-10) 200 MG CAPS Take 200 mg by mouth daily.   digoxin (LANOXIN)  0.125 MG tablet TAKE ONE TABLET BY MOUTH DAILY   fexofenadine (ALLEGRA) 180 MG tablet Take 180 mg by mouth daily.   furosemide (LASIX) 40 MG tablet TAKE ONE TABLET BY MOUTH DAILY   gabapentin (NEURONTIN) 300 MG capsule Take 2 capsules (600 mg total) by mouth at bedtime.   LevOCARNitine (CARNITINE PO) Take 500 mg by mouth daily.   Multiple Vitamin (MULTIVITAMIN) tablet Take 1 tablet by mouth daily.   nitroGLYCERIN (NITROSTAT) 0.4 MG SL tablet Place 1 tablet (0.4 mg total) under the tongue every 5 (five) minutes as needed for chest pain.   NON FORMULARY Take 465 mg by mouth as needed (joint pain). HiActives Tart Cherry   Omega-3 Fatty Acids (OMEGA 3 PO) Take 1 capsule by mouth daily.   sacubitril-valsartan (ENTRESTO) 24-26 MG Take 1 tablet by mouth 2 (two) times daily.   vitamin C (ASCORBIC ACID) 500 MG tablet Take 500 mg by mouth daily.   VITAMIN D, CHOLECALCIFEROL, PO Take 5,000 Units by mouth daily.   XARELTO 20 MG TABS tablet TAKE ONE TABLET BY MOUTH DAILY WITH SUPPER     Allergies:   Apple juice, Soybean oil, Lisinopril, Other, Bean pod extract, Carrot [daucus carota], Grass extracts [gramineae pollens], Peach flavor, and Tree extract   Social History   Socioeconomic History   Marital status: Single    Spouse name: Not on file   Number of children: Not on file   Years of education: Not on file   Highest education level: Not on file  Occupational History   Not on file  Tobacco Use   Smoking status: Former    Types: Cigars   Smokeless tobacco: Never   Tobacco comments:    1 cigar per month states he stopped smoking  Vaping Use   Vaping Use: Never used  Substance and Sexual Activity   Alcohol use: No   Drug use: No    Comment: 1 per month...pt states he stopped smoking   Sexual activity: Not Currently  Other Topics Concern   Not on file  Social History Narrative   Not on file   Social Determinants of Health   Financial Resource Strain: Not on file  Food Insecurity: Not  on file  Transportation Needs: Not on file  Physical Activity: Not on file  Stress: Not on file  Social Connections: Not on file     Recent Labs: 01/05/2021: B Natriuretic Peptide 626.1; TSH 1.322 01/07/2021: Hemoglobin 14.6; Platelets 148 05/27/2021: ALT 36; BUN 10; Creatinine, Ser 0.89; Magnesium 2.1; Potassium 4.8; Sodium 138  Recent Lipid Panel    Component Value Date/Time   CHOL 134 01/05/2021 0456   CHOL 230 (H) 09/25/2018 1516   TRIG 109 01/05/2021 0456   HDL 51 01/05/2021 0456   HDL 57 09/25/2018 1516   CHOLHDL 2.6 01/05/2021 0456   VLDL 22 01/05/2021 0456   LDLCALC 61 01/05/2021 0456   LDLCALC 146 (  H) 09/25/2018 1516     Risk Assessment/Calculations:              Physical Exam:    VS:  BP 120/70 (BP Location: Left Arm, Patient Position: Sitting, Cuff Size: Normal)   Pulse (!) 52   Ht '5\' 8"'$  (1.727 m)   Wt 261 lb (118.4 kg)   SpO2 94%   BMI 39.68 kg/m     Wt Readings from Last 3 Encounters:  08/18/21 261 lb (118.4 kg)  06/01/21 259 lb 6.4 oz (117.7 kg)  05/27/21 260 lb (117.9 kg)     GEN:  Well nourished, well developed in no acute distress HEENT: Normal NECK: No JVD; No carotid bruits LYMPHATICS: No lymphadenopathy CARDIAC: RRR, no murmurs, no rubs, gallops RESPIRATORY:  Clear to auscultation without rales, wheezing or rhonchi  ABDOMEN: Soft, non-tender, non-distended MUSCULOSKELETAL:  No edema; No deformity  SKIN: Warm and dry NEUROLOGIC:  Alert and oriented x 3 PSYCHIATRIC:  Normal affect   ASSESSMENT:    1. Chronic systolic congestive heart failure ([place])   2. Coronary artery disease involving native coronary artery of native heart without angina pectoris   3. Paroxysmal atrial fibrillation (HCC)   4. VT (ventricular tachycardia) (HCC)    PLAN:    In order of problems listed above:  Chronic systolic heart failure-ICD-EF 30 to 35% on 01/06/2021 #Medications reviewed as above.  No change in prescription drug management.  These include  Entresto as well as digoxin.  Carvedilol 12.5 twice a day.  Excellent.  No changes made.  Recent creatinine 0.9 hemoglobin 14.6 TSH 1.3 ALT 36.  LDL 61.  At goal.  Paroxysmal atrial fibrillation - Continue with Xarelto for anticoagulation.  Hyperlipidemia - On atorvastatin 40 mg.  Excellent.  At goal.  Coronary artery disease 12/2020 Cath:Diagnostic Dominance: Right      Medication Adjustments/Labs and Tests Ordered: Current medicines are reviewed at length with the patient today.  Concerns regarding medicines are outlined above.  No orders of the defined types were placed in this encounter.  No orders of the defined types were placed in this encounter.   Patient Instructions  Medication Instructions:  Your physician recommends that you continue on your current medications as directed. Please refer to the Current Medication list given to you today.  *If you need a refill on your cardiac medications before your next appointment, please call your pharmacy*  Follow-Up: At [facility], you and your health needs are our priority.  As part of our continuing mission to provide you with exceptional heart care, we have created designated Provider Care Teams.  These Care Teams include your primary Cardiologist (physician) and Advanced Practice Providers (APPs -  Physician Assistants and Nurse Practitioners) who all work together to provide you with the care you need, when you need it.  Your next appointment:   6 month(s)  The format for your next appointment:   In Person  Provider:   [name], MD     Important Information About Sugar           Signed, [name], MD  08/18/2021 5:02 PM    [place]

## 2021-08-20 ENCOUNTER — Other Ambulatory Visit: Payer: Self-pay

## 2021-08-20 DIAGNOSIS — I480 Paroxysmal atrial fibrillation: Secondary | ICD-10-CM

## 2021-08-20 MED ORDER — SACUBITRIL-VALSARTAN 24-26 MG PO TABS: 1.0000 | ORAL_TABLET | Freq: Two times a day (BID) | ORAL | 3 refills | Status: DC

## 2021-08-20 MED ORDER — RIVAROXABAN 20 MG PO TABS: ORAL_TABLET | ORAL | 1 refills | Status: DC

## 2021-08-20 NOTE — Telephone Encounter (Signed)
Prescription refill request for Xarelto received.  Indication:Afib  Last office visit:08/18/21 ([place])  Weight: 118.4kg Age: 66 Scr:0.89 (05/27/21) CrCl: 133.7m/min  Appropriate dose and refill sent to requested pharmacy.

## 2021-08-28 ENCOUNTER — Encounter: Payer: Self-pay | Admitting: Cardiology

## 2021-08-28 ENCOUNTER — Ambulatory Visit (INDEPENDENT_AMBULATORY_CARE_PROVIDER_SITE_OTHER): Payer: Medicare Other | Admitting: Cardiology

## 2021-08-28 ENCOUNTER — Other Ambulatory Visit: Payer: Medicare Other

## 2021-08-28 VITALS — BP 112/74 | HR 69 | Ht 68.0 in | Wt 268.2 lb

## 2021-08-28 DIAGNOSIS — I472 Ventricular tachycardia, unspecified: Secondary | ICD-10-CM | POA: Diagnosis not present

## 2021-08-28 DIAGNOSIS — I5022 Chronic systolic (congestive) heart failure: Secondary | ICD-10-CM | POA: Diagnosis not present

## 2021-08-28 DIAGNOSIS — D6869 Other thrombophilia: Secondary | ICD-10-CM | POA: Diagnosis not present

## 2021-08-28 DIAGNOSIS — Z79899 Other long term (current) drug therapy: Secondary | ICD-10-CM

## 2021-08-28 DIAGNOSIS — R079 Chest pain, unspecified: Secondary | ICD-10-CM

## 2021-08-28 DIAGNOSIS — I4821 Permanent atrial fibrillation: Secondary | ICD-10-CM | POA: Diagnosis not present

## 2021-08-28 NOTE — Progress Notes (Signed)
Electrophysiology Office Note   Date:  08/28/2021   ID:  Norman Rodriguez, DOB 1955/05/24, MRN [id number]  PCP:  Bonnita Hollow, MD  Cardiologist:  Marlou Porch Primary Electrophysiologist:  Sheniqua Carolan Meredith Leeds, MD    Chief Complaint: ICD   History of Present Illness: Norman Rodriguez is a 66 y.o. male who is being seen today for the evaluation of ICD at the request of Bonnita Hollow, MD. Presenting today for electrophysiology evaluation.  He has a history significant for atrial fibrillation, coronary artery disease, chronic systolic heart failure, COPD, hypertension.  He presented to the hospital December 2022 with sustained ventricular tachycardia.  He underwent left heart catheterization with no target lesion.  He is now status post Abbott ICD implanted 01/06/2021.  Today, he denies symptoms of palpitations, chest pain, shortness of breath, orthopnea, PND, lower extremity edema, claudication, dizziness, presyncope, syncope, bleeding, or neurologic sequela. The patient is tolerating medications without difficulties.    Past Medical History:  Diagnosis Date   ACS (acute coronary syndrome) ([place])    Arthritis    Atrial fibrillation ([place])    a. Dx 2010. b. Initiated on Tikosyn 03/2013.   BRBPR (bright red blood per rectum) 10/22/2017   CAD (coronary artery disease)    a. BMS 2.20m 23 to LAD. CTO of RCA unsuccessful, good left to right collaterals - 2010.   Chronic anticoagulation    Chronic systolic CHF (congestive heart failure) ([facility]    a. Both ischemic and tachy induced. b. Echo 04/2013: EF 30-35%, no RWMA, normal RV, mod dilated LA (before restoration of NSR).   COPD (chronic obstructive pulmonary disease) ([place] 10/22/2017   Dysrhythmia    hx of atrial fibrilation   HTN (hypertension)    Hyperlipidemia    Melena    NSTEMI (non-ST elevated myocardial infarction) ([facility] 01/05/2021   NSVT (nonsustained ventricular tachycardia) (HCC)    Obesity    Polyp of cecum    Polyp of  descending colon    Polyp of rectum    Polyp of transverse colon    Shortness of breath    Past Surgical History:  Procedure Laterality Date   CARDIOVERSION N/A 04/27/2013   Procedure: BEDSIDE CARDIOVERSION;  Surgeon: MSanda Klein MD;  [place] [facility]  Service: Cardiovascular;  Laterality: N/A;   CARDIOVERSION N/A 05/15/2013   Procedure: CARDIOVERSION/BEDSIDE;  Surgeon: DLarey Dresser MD;  [place]  Service: Cardiovascular;  Laterality: N/A;   CARDIOVERSION N/A 05/16/2014   Procedure: CARDIOVERSION;  Surgeon: MJerline Pain MD;  [place]  Service: Cardiovascular;  Laterality: N/A;   CARDIOVERSION N/A 06/07/2014   Procedure: CARDIOVERSION;  Surgeon: MJerline Pain MD;  [place] [facility]  Service: Cardiovascular;  Laterality: N/A;   COLONOSCOPY WITH PROPOFOL N/A 10/27/2017   Procedure: COLONOSCOPY WITH PROPOFOL;  Surgeon: NMauri Pole MD;  [facility];  Service: Endoscopy;  Laterality: N/A;   CORONARY ANGIOPLASTY WITH STENT PLACEMENT  2010   BMS 2.576m23 to LAD. CTO of RCA unsuccessful, good left to right collaterals   ESOPHAGOGASTRODUODENOSCOPY (EGD) WITH PROPOFOL N/A 10/24/2017   Procedure: ESOPHAGOGASTRODUODENOSCOPY (EGD) WITH PROPOFOL;  Surgeon: NaMauri PoleMD;  [facility];  Service: Endoscopy;  Laterality: N/A;   FOOT SURGERY Right    ICD IMPLANT N/A 01/06/2021   Procedure: ICD IMPLANT;  Surgeon: CaConstance HawMD;  [facility];  Service: Cardiovascular;  Laterality: N/A;   LEFT HEART CATH AND CORONARY ANGIOGRAPHY N/A 01/05/2021  Procedure: LEFT HEART CATH AND CORONARY ANGIOGRAPHY;  Surgeon: Lorretta Harp, MD;  Location: [place] [facility];  Service: Cardiovascular;  Laterality: N/A;   POLYPECTOMY  10/27/2017   Procedure: POLYPECTOMY;  Surgeon: Mauri Pole, MD;  [facility];  Service: Endoscopy;;     Current Outpatient Medications  Medication Sig Dispense Refill    Acetaminophen 500 MG PACK Take 1-2 tablets by mouth 3 (three) times daily as needed. 180 each 0   ANORO ELLIPTA 62.5-25 MCG/INH AEPB INHALE ONE PUFFS INTO THE LUNGS DAILY 60 each 3   atorvastatin (LIPITOR) 40 MG tablet Take 1 tablet (40 mg total) by mouth daily. 90 tablet 3   carvedilol (COREG) 12.5 MG tablet Take 1.5 tablets (18.75 mg total) by mouth 2 (two) times daily. 270 tablet 3   Coenzyme Q10 (CO Q-10) 200 MG CAPS Take 200 mg by mouth daily.     digoxin (LANOXIN) 0.125 MG tablet TAKE ONE TABLET BY MOUTH DAILY 90 tablet 3   fexofenadine (ALLEGRA) 180 MG tablet Take 180 mg by mouth daily.     furosemide (LASIX) 40 MG tablet TAKE ONE TABLET BY MOUTH DAILY 90 tablet 2   gabapentin (NEURONTIN) 300 MG capsule Take 2 capsules (600 mg total) by mouth at bedtime. 60 capsule 2   LevOCARNitine (CARNITINE PO) Take 500 mg by mouth daily.     Multiple Vitamin (MULTIVITAMIN) tablet Take 1 tablet by mouth daily.     nitroGLYCERIN (NITROSTAT) 0.4 MG SL tablet Place 1 tablet (0.4 mg total) under the tongue every 5 (five) minutes as needed for chest pain. 25 tablet 4   NON FORMULARY Take 465 mg by mouth as needed (joint pain). HiActives Tart Cherry     Omega-3 Fatty Acids (OMEGA 3 PO) Take 1 capsule by mouth daily.     rivaroxaban (XARELTO) 20 MG TABS tablet TAKE ONE TABLET BY MOUTH DAILY WITH SUPPER 90 tablet 1   sacubitril-valsartan (ENTRESTO) 24-26 MG Take 1 tablet by mouth 2 (two) times daily. 180 tablet 3   vitamin C (ASCORBIC ACID) 500 MG tablet Take 500 mg by mouth daily.     VITAMIN D, CHOLECALCIFEROL, PO Take 5,000 Units by mouth daily.     No current facility-administered medications for this visit.    Allergies:   Apple juice, Soybean oil, Lisinopril, Other, Bean pod extract, Carrot [daucus carota], Grass extracts [gramineae pollens], Peach flavor, and Tree extract   Social History:  The patient  reports that he has quit smoking. His smoking use included cigars. He has never used smokeless  tobacco. He reports that he does not drink alcohol and does not use drugs.   Family History:  The patient's family history includes Other in his father.    ROS:  Please see the history of present illness.   Otherwise, review of systems is positive for none.   All other systems are reviewed and negative.    PHYSICAL EXAM: VS:  BP 112/74   Pulse 69   Ht '5\' 8"'$  (1.727 m)   Wt 268 lb 3.2 oz (121.7 kg)   SpO2 92%   BMI 40.78 kg/m  , BMI Body mass index is 40.78 kg/m. GEN: Well nourished, well developed, in no acute distress  HEENT: normal  Neck: no JVD, carotid bruits, or masses Cardiac: RRR; no murmurs, rubs, or gallops,no edema  Respiratory:  clear to auscultation bilaterally, normal work of breathing GI: soft, nontender, nondistended, + BS MS: no deformity or atrophy  Skin: warm  and dry, device pocket is well healed Neuro:  Strength and sensation are intact Psych: euthymic mood, full affect  EKG:  EKG is not ordered today. Personal review of the ekg ordered 05/27/21 shows AF, rate 73  Device interrogation is reviewed today in detail.  See PaceArt for details.   Recent Labs: 01/05/2021: B Natriuretic Peptide 626.1; TSH 1.322 01/07/2021: Hemoglobin 14.6; Platelets 148 05/27/2021: ALT 36; BUN 10; Creatinine, Ser 0.89; Magnesium 2.1; Potassium 4.8; Sodium 138    Lipid Panel     Component Value Date/Time   CHOL 134 01/05/2021 0456   CHOL 230 (H) 09/25/2018 1516   TRIG 109 01/05/2021 0456   HDL 51 01/05/2021 0456   HDL 57 09/25/2018 1516   CHOLHDL 2.6 01/05/2021 0456   VLDL 22 01/05/2021 0456   LDLCALC 61 01/05/2021 0456   LDLCALC 146 (H) 09/25/2018 1516     Wt Readings from Last 3 Encounters:  08/28/21 268 lb 3.2 oz (121.7 kg)  08/18/21 261 lb (118.4 kg)  06/01/21 259 lb 6.4 oz (117.7 kg)      Other studies Reviewed: Additional studies/ records that were reviewed today include: TTE 01/06/21  Review of the above records today demonstrates:   1. There is no left  ventricular thrombus. Left ventricular ejection  fraction, by estimation, is 30 to 35%. The left ventricle has moderately  decreased function. The left ventricle demonstrates regional wall motion  abnormalities (see scoring  diagram/findings for description). The left ventricular internal cavity  size was mildly dilated. Left ventricular diastolic function could not be  evaluated. There is akinesis of the left ventricular, entire inferior  wall, inferolateral wall and  inferoseptal wall.   2. Right ventricular systolic function is mildly reduced. The right  ventricular size is normal. There is moderately elevated pulmonary artery  systolic pressure.   3. Left atrial size was moderately dilated.   4. Right atrial size was mildly dilated.   5. The mitral valve is normal in structure. Mild mitral valve  regurgitation.   6. The aortic valve is tricuspid. Aortic valve regurgitation is not  visualized. Aortic valve sclerosis is present, with no evidence of aortic  valve stenosis.   7. The inferior vena cava is dilated in size with <50% respiratory  variability, suggesting right atrial pressure of 15 mmHg.   LHC 01/05/22   Prox Cx to Mid Cx lesion is 70% stenosed.   Prox RCA to Mid RCA lesion is 90% stenosed.   Mid RCA lesion is 100% stenosed.   Previously placed Prox LAD to Mid LAD stent (unknown type) is  widely patent.   There is severe left ventricular systolic dysfunction.   LV end diastolic pressure is mildly elevated.   The left ventricular ejection fraction is 25-35% by visual estimate.   ASSESSMENT AND PLAN:  1.  Ventricular tachycardia: Status post Abbott ICD.  Device functioning appropriately.  No changes at this time.  2.  Chronic systolic heart failure: Currently on optimal medical therapy per primary cardiology.  He does have evidence of volume overload.  We Keaunna Skipper enroll him in [facility] clinic.  We Amaura Authier have him take his Lasix to milligrams daily for the next 5 days and then go  back down to 20 mg daily.  Potassium is high normal currently.  We Cienna Dumais not plan for excess supplementation.  3.  Coronary artery disease: No current chest pain.  4.  Hypertension: Currently well controlled  5.  Permanent atrial fibrillation: CHA2DS2-VASc of 5.  Currently  on Xarelto..  6.  Secondary hypercoagulable state: Currently on Xarelto for atrial fibrillation as above  Current medicines are reviewed at length with the patient today.   The patient does not have concerns regarding his medicines.  The following changes were made today: Increase Lasix  Labs/ tests ordered today include:  No orders of the defined types were placed in this encounter.    Disposition:   FU with [facility] Bellew 1 year  Signed, Devorah Givhan Meredith Leeds, MD  08/28/2021 2:22 PM     [address] [place] [address] [phone] (office) [phone] (fax)

## 2021-08-28 NOTE — Patient Instructions (Signed)
Medication Instructions:  Your physician has recommended you make the following change in your medication: 1. INCREASE Lasix to take 1 whole tablet daily through Wednesday 8/9, then return to normal dosing  *If you need a refill on your cardiac medications before your next appointment, please call your pharmacy*   Lab Work: None ordered   Testing/Procedures: None ordered   Follow-Up: At [facility], you and your health needs are our priority.  As part of our continuing mission to provide you with exceptional heart care, we have created designated Provider Care Teams.  These Care Teams include your primary Cardiologist (physician) and Advanced Practice Providers (APPs -  Physician Assistants and Nurse Practitioners) who all work together to provide you with the care you need, when you need it.  We recommend signing up for the patient portal called "MyChart".  Sign up information is provided on this After Visit Summary.  MyChart is used to connect with patients for Virtual Visits (Telemedicine).  Patients are able to view lab/test results, encounter notes, upcoming appointments, etc.  Non-urgent messages can be sent to your provider as well.   To learn more about what you can do with MyChart, go to [email or web address].    Your next appointment:   1 year(s)  The format for your next appointment:   In Person  Provider:   You will see one of the following Advanced Practice Providers on your designated Care Team:   Tommye Standard, [place] Legrand Como "Jonni Sanger" [place], [place]    You have been referred to our [facility] clinic.  Someone will contact you about this.    Thank you for choosing [facility]!!   Trinidad Curet, RN [phone]  Other Instructions   Important Information About Sugar

## 2021-08-29 LAB — DIGOXIN LEVEL: Digoxin, Serum: 0.6 ng/mL (ref 0.5–0.9)

## 2021-09-02 ENCOUNTER — Other Ambulatory Visit: Payer: Self-pay | Admitting: Family Medicine

## 2021-09-02 DIAGNOSIS — M19171 Post-traumatic osteoarthritis, right ankle and foot: Secondary | ICD-10-CM

## 2021-09-08 ENCOUNTER — Telehealth: Payer: Self-pay

## 2021-09-08 ENCOUNTER — Ambulatory Visit (INDEPENDENT_AMBULATORY_CARE_PROVIDER_SITE_OTHER): Payer: Medicare Other

## 2021-09-08 ENCOUNTER — Ambulatory Visit: Payer: Medicare Other

## 2021-09-08 DIAGNOSIS — I5022 Chronic systolic (congestive) heart failure: Secondary | ICD-10-CM

## 2021-09-08 DIAGNOSIS — Z95810 Presence of automatic (implantable) cardiac defibrillator: Secondary | ICD-10-CM

## 2021-09-08 NOTE — Telephone Encounter (Signed)
Referred to [facility] clinic by Dr Curt Bears.   Attempted call to patient for ICM intro and left message for return call.

## 2021-09-08 NOTE — Progress Notes (Signed)
EPIC Encounter for ICM Monitoring  Patient Name: Norman Rodriguez is a 66 y.o. male Date: 09/08/2021 Primary Care Physican: Bonnita Hollow, MD Primary Cardiologist: Marlou Porch Electrophysiologist: Curt Bears        1st ICM Remote Transmission.  Spoke with patient and ICM intro given.  Pt declined monthly ICM follow up to check fluid levels.  He is feeling fine since fluid has resolved.  He typically drinks more than recommended 64 oz fluid daily.   CorVue thoracic impedance suggesting dryness starting 8/9 after Furosemide dosage was increased at 08/31/2021 OV with Dr Ileana Ladd but returned close to baseline on 8/15.  Impedance was suggesting possible significant fluid accumulation from 7/28-8/7.  Prescribed:  Furosemide 40 mg take 1 tablet(s) (40 mg total) by mouth daily.  Recommendations:   Discussed limiting fluid intake o 64 oz daily and encouraged to call Dr Marlou Porch office if he develops fluid symptoms.    Follow-up plan: No further ICM clinic phone appointment scheduled since patient decline monthly monitorin.   91 day device clinic remote transmission 10/05/2021.    EP/Cardiology Office Visits: Recall 02/14/2022 with Dr. Marlou Porch.    Copy of ICM check sent to Dr. Curt Bears.   3 month ICM trend: 09/08/2021.    12-14 Month ICM trend:     Rosalene Billings, RN 09/08/2021 4:13 PM

## 2021-09-08 NOTE — Telephone Encounter (Signed)
-----   Message from Stanton Kidney, RN sent at 08/28/2021  2:21 PM EDT ----- Regarding: ICM clinic Please enroll in the [facility] clinic  thx

## 2021-09-08 NOTE — Progress Notes (Signed)
See previous ICM note.

## 2021-09-08 NOTE — Telephone Encounter (Signed)
Spoke with patient and provided ICM intro.  Pt declined ICM monthly follow up.  He is feeling fine since taking Furosemide and fluid has resolved.  Discussed limiting fluid intake to 64 oz daily and he typically drinks more the recommended amount daily.  Advise to call Dr Marlou Porch office if he develops any fluid symptoms in the future.

## 2021-10-05 ENCOUNTER — Ambulatory Visit (INDEPENDENT_AMBULATORY_CARE_PROVIDER_SITE_OTHER): Payer: Medicare Other

## 2021-10-05 DIAGNOSIS — I5022 Chronic systolic (congestive) heart failure: Secondary | ICD-10-CM

## 2021-10-07 LAB — CUP PACEART REMOTE DEVICE CHECK
Battery Remaining Longevity: 115 mo
Battery Remaining Percentage: 93 %
Battery Voltage: 3.02 V
Brady Statistic RV Percent Paced: 1.3 %
Date Time Interrogation Session: 20230911023652
HighPow Impedance: 69 Ohm
Implantable Lead Implant Date: 20221213
Implantable Lead Location: 753860
Implantable Pulse Generator Implant Date: 20221213
Lead Channel Impedance Value: 440 Ohm
Lead Channel Pacing Threshold Amplitude: 0.5 V
Lead Channel Pacing Threshold Pulse Width: 0.5 ms
Lead Channel Sensing Intrinsic Amplitude: 11.4 mV
Lead Channel Setting Pacing Amplitude: 2.5 V
Lead Channel Setting Pacing Pulse Width: 0.5 ms
Lead Channel Setting Sensing Sensitivity: 0.5 mV
Pulse Gen Serial Number: 810054243

## 2021-10-19 ENCOUNTER — Telehealth: Payer: Self-pay

## 2021-10-19 NOTE — Telephone Encounter (Signed)
Device alert for sustained VT with successful ATP therapy. Event occurred 9/22 @ 18:29, EGM shows sustained VT, HR 181, ATP delivered successfully x1 converting to regular VS Route to triage.  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 States today he feels better and was able to stand in place without ambulating which is better than days before. This is affected his mobility to getting to the bathroom which has resulted in patient NOT taking his full Lasix. Corvue does not reflect fluid retention. Patient does report compliance with medications on file appropriately. Patient does any specific symptoms during time of event.   Patient does not drive, did review driving restrictions with patient and shock plan with verbal understanding.  Advised I will forward to Dr. Curt Bears for review.

## 2021-10-19 NOTE — Telephone Encounter (Signed)
Attempted to contact patient. No answer, [facility].

## 2021-10-22 NOTE — Progress Notes (Signed)
Remote ICD transmission.   

## 2021-11-23 ENCOUNTER — Other Ambulatory Visit: Payer: Self-pay

## 2021-11-23 MED ORDER — CARVEDILOL 12.5 MG PO TABS: 18.7500 mg | ORAL_TABLET | Freq: Two times a day (BID) | ORAL | 2 refills | Status: DC

## 2021-11-23 NOTE — Telephone Encounter (Signed)
Pt's medication was sent to pt's pharmacy as requested. Confirmation received.  °

## 2022-01-04 ENCOUNTER — Ambulatory Visit (INDEPENDENT_AMBULATORY_CARE_PROVIDER_SITE_OTHER): Payer: Medicare Other

## 2022-01-04 DIAGNOSIS — I5022 Chronic systolic (congestive) heart failure: Secondary | ICD-10-CM

## 2022-01-04 DIAGNOSIS — I472 Ventricular tachycardia, unspecified: Secondary | ICD-10-CM

## 2022-01-05 LAB — CUP PACEART REMOTE DEVICE CHECK
Battery Remaining Longevity: 113 mo
Battery Remaining Percentage: 91 %
Battery Voltage: 3.01 V
Brady Statistic RV Percent Paced: 2.1 %
Date Time Interrogation Session: 20231211010152
HighPow Impedance: 70 Ohm
Implantable Lead Connection Status: 753985
Implantable Lead Implant Date: 20221213
Implantable Lead Location: 753860
Implantable Pulse Generator Implant Date: 20221213
Lead Channel Impedance Value: 460 Ohm
Lead Channel Pacing Threshold Amplitude: 0.5 V
Lead Channel Pacing Threshold Pulse Width: 0.5 ms
Lead Channel Sensing Intrinsic Amplitude: 10.3 mV
Lead Channel Setting Pacing Amplitude: 2.5 V
Lead Channel Setting Pacing Pulse Width: 0.5 ms
Lead Channel Setting Sensing Sensitivity: 0.5 mV
Pulse Gen Serial Number: 810054243

## 2022-01-13 ENCOUNTER — Other Ambulatory Visit: Payer: Self-pay | Admitting: Cardiology

## 2022-01-25 HISTORY — PX: CARDIAC DEFIBRILLATOR PLACEMENT: SHX171

## 2022-02-11 ENCOUNTER — Other Ambulatory Visit: Payer: Self-pay | Admitting: Cardiology

## 2022-02-11 NOTE — Progress Notes (Signed)
Remote ICD transmission.   

## 2022-03-30 ENCOUNTER — Telehealth: Payer: Self-pay | Admitting: Family Medicine

## 2022-03-30 ENCOUNTER — Other Ambulatory Visit: Payer: Self-pay | Admitting: Family Medicine

## 2022-03-30 DIAGNOSIS — M19171 Post-traumatic osteoarthritis, right ankle and foot: Secondary | ICD-10-CM

## 2022-03-30 MED ORDER — ACETAMINOPHEN EXTRA STRENGTH 500 MG PO TABS: ORAL_TABLET | ORAL | 0 refills | Status: DC

## 2022-03-30 NOTE — Telephone Encounter (Signed)
Prescription Request  03/30/2022  LOV: 06/01/2021  What is the name of the medication or equipment? Acetaminophen Extra Strength 500 MG TABS  5 pills left - appt scheduled for 04/13/22 2:20p - pt said that he needs to arrange transportation  Have you contacted your pharmacy to request a refill? Yes - no refills  Which pharmacy would you like this sent to?  [facility], [address] [place] [address] Phone: [phone] Fax: [phone]    Patient notified that their request is being sent to the clinical staff for review and that they should receive a response within 2 business days.   Please advise at Mobile [phone] (mobile)

## 2022-04-05 ENCOUNTER — Telehealth: Payer: Self-pay | Admitting: Cardiology

## 2022-04-05 ENCOUNTER — Ambulatory Visit (INDEPENDENT_AMBULATORY_CARE_PROVIDER_SITE_OTHER): Payer: 59

## 2022-04-05 DIAGNOSIS — I5022 Chronic systolic (congestive) heart failure: Secondary | ICD-10-CM | POA: Diagnosis not present

## 2022-04-05 DIAGNOSIS — I472 Ventricular tachycardia, unspecified: Secondary | ICD-10-CM

## 2022-04-05 NOTE — Telephone Encounter (Signed)
Device is MRI compatible.  Returned call to Pt to advise.

## 2022-04-05 NOTE — Telephone Encounter (Signed)
Pt wants to know if its okay for him to have an MRI with his ICD. Please advise.

## 2022-04-07 LAB — CUP PACEART REMOTE DEVICE CHECK
Battery Remaining Longevity: 110 mo
Battery Remaining Percentage: 89 %
Battery Voltage: 3.01 V
Brady Statistic RV Percent Paced: 2 %
Date Time Interrogation Session: 20240311030019
HighPow Impedance: 71 Ohm
Implantable Lead Connection Status: 753985
Implantable Lead Implant Date: 20221213
Implantable Lead Location: 753860
Implantable Pulse Generator Implant Date: 20221213
Lead Channel Impedance Value: 410 Ohm
Lead Channel Pacing Threshold Amplitude: 0.5 V
Lead Channel Pacing Threshold Pulse Width: 0.5 ms
Lead Channel Sensing Intrinsic Amplitude: 10.4 mV
Lead Channel Setting Pacing Amplitude: 2.5 V
Lead Channel Setting Pacing Pulse Width: 0.5 ms
Lead Channel Setting Sensing Sensitivity: 0.5 mV
Pulse Gen Serial Number: 810054243

## 2022-04-08 ENCOUNTER — Telehealth: Payer: Self-pay | Admitting: *Deleted

## 2022-04-08 MED ORDER — CARVEDILOL 25 MG PO TABS: 25.0000 mg | ORAL_TABLET | Freq: Two times a day (BID) | ORAL | 1 refills | Status: DC

## 2022-04-08 NOTE — Telephone Encounter (Signed)
Pt advised to increase Carvedilol to 25 mg BID.  (Pt reported that he is taking 1 tab TID currently, although not taking a third dose every day.  He did state it would be easier taking it BID)  He will take 2 of the 12.5 mg tablets he has on hand at home, and will take this twice daily until supply runs out. He is aware new Rx is for 25 mg tablets. Advised to call office if SE occur after increase, such as blood pressure drops to low. Patient verbalized understanding and agreeable to plan.

## 2022-04-08 NOTE — Telephone Encounter (Signed)
-----   Message from Will Meredith Leeds, MD sent at 04/07/2022  4:52 PM EDT ----- Abnormal device interrogation reviewed.  Lead parameters and battery status stable.  NSVT. Increase coreg to 25 mg

## 2022-04-13 ENCOUNTER — Ambulatory Visit: Payer: Medicaid Other | Admitting: Family Medicine

## 2022-04-20 ENCOUNTER — Ambulatory Visit: Payer: Medicaid Other | Admitting: Family Medicine

## 2022-05-13 NOTE — Progress Notes (Signed)
Remote ICD transmission.   

## 2022-05-25 ENCOUNTER — Other Ambulatory Visit: Payer: Self-pay

## 2022-05-25 DIAGNOSIS — I480 Paroxysmal atrial fibrillation: Secondary | ICD-10-CM

## 2022-05-25 MED ORDER — ATORVASTATIN CALCIUM 40 MG PO TABS: 40.0000 mg | ORAL_TABLET | Freq: Every day | ORAL | 0 refills | Status: DC

## 2022-05-25 MED ORDER — RIVAROXABAN 20 MG PO TABS: ORAL_TABLET | ORAL | 1 refills | Status: DC

## 2022-05-25 NOTE — Telephone Encounter (Signed)
Pt last saw Dr Elberta Fortis 08/28/21, last labs 05/27/21 Creat 0.89, age 67, weight 121.7kg, CrCl 134.84, based on CrCl pt is on appropriate dosage of Xarelto 20mg  QD for afib.  Will refill rx.

## 2022-05-25 NOTE — Telephone Encounter (Signed)
Pt calling requesting a 100 day count of Xarelto be sent to his pharmacy. Please address

## 2022-06-10 ENCOUNTER — Encounter: Payer: Self-pay | Admitting: Family Medicine

## 2022-06-10 ENCOUNTER — Ambulatory Visit (INDEPENDENT_AMBULATORY_CARE_PROVIDER_SITE_OTHER): Payer: 59 | Admitting: Family Medicine

## 2022-06-10 VITALS — BP 132/86 | HR 83 | Ht 68.0 in | Wt 250.2 lb

## 2022-06-10 DIAGNOSIS — M7989 Other specified soft tissue disorders: Secondary | ICD-10-CM | POA: Diagnosis not present

## 2022-06-10 DIAGNOSIS — I5022 Chronic systolic (congestive) heart failure: Secondary | ICD-10-CM | POA: Diagnosis not present

## 2022-06-10 DIAGNOSIS — R609 Edema, unspecified: Secondary | ICD-10-CM | POA: Diagnosis not present

## 2022-06-10 MED ORDER — SACUBITRIL-VALSARTAN 24-26 MG PO TABS: 1.0000 | ORAL_TABLET | Freq: Two times a day (BID) | ORAL | 0 refills | Status: DC

## 2022-06-10 MED ORDER — FUROSEMIDE 40 MG PO TABS: 40.0000 mg | ORAL_TABLET | Freq: Every day | ORAL | 0 refills | Status: DC

## 2022-06-10 MED ORDER — COLCHICINE 0.6 MG PO CAPS: ORAL_CAPSULE | ORAL | 0 refills | Status: DC

## 2022-06-10 NOTE — Patient Instructions (Addendum)
Medications Prescribed: Entresto and Lasix refilled Colchicine: Initial Dose: Take 2 capsules today, follow with 1 capsule one hour later Maintenance Dose (if needed): 1 capsule daily for up to 7 days Follow-Up Steps:  Blood Tests:  Uric Acid ESR Rheumatoid Arthritis Panel (RF and Anti-CCP) ANA Reflex HLA B27  Appointments:  Cardiology: Follow-up  Rheumatology: Schedule an appointment for further evaluation and potential diagnosis of psoriatic arthritis or other inflammatory conditions.  Special Instructions:  Continue monitoring the swelling and pain levels in the left middle finger, and document any changes or improvements while on the colchicine medication. Be aware of the need for fasting for any future bloodwork as prescribed by the cardiologist. For any immediate concerns or worsening symptoms, please contact your primary care provider or visit the nearest healthcare facility.

## 2022-06-10 NOTE — Progress Notes (Signed)
Assessment/Plan:   Problem List Items Addressed This Visit       Cardiovascular and Mediastinum   Chronic systolic heart failure (HCC)    Patient has ongoing issues regarding chronic systolic heart failure and remains on medications including Entresto and furosemide. Scheduled to see cardiologist soon for continued management.  Plan:  Refill Entresto 24/26mg , twice daily for 30 days until cardiology appointment. Refill furosemide 40mg , daily for 30 days until cardiology appointment. Continue with current medications including carvedilol and digoxin. Follow up with the cardiologist on the 31st of this month for further evaluation.      Relevant Medications   sacubitril-valsartan (ENTRESTO) 24-26 MG   furosemide (LASIX) 40 MG tablet     Other   Finger swelling - Primary    History of persistent swelling and pain in the left middle finger, previously unresponsive to joint injection and daily acetaminophen usage. Patient suspects psoriatic arthritis but requires further evaluation by rheumatology.  Differential Diagnosis:  Psoriatic Arthritis Osteoarthritis Gout Infection Rheumatoid Arthritis  Plan: Order blood work including uric acid, ESR, CRP, Rheumatoid Arthritis panel, ANA reflex, and HLA-B27 antigen. Start Colchicine, with dosing as follows: take 2 capsules today, then 1 capsule an hour later, and if still having swelling, take 1 capsule daily for up to 7 days. Patient to follow up with rheumatology, plans to schedule appointment with [name].      Relevant Medications   Colchicine 0.6 MG CAPS   Other Relevant Orders   Uric acid (Completed)   Sedimentation rate (Completed)   C-reactive protein (Completed)   Arthritis Panel (Completed)   Rheumatoid Arthritis Profile (Completed)   ANA w/Reflex   HLA-B27 antigen (Completed)   Other Visit Diagnoses     Swelling           Medications Discontinued During This Encounter  Medication Reason    sacubitril-valsartan (ENTRESTO) 24-26 MG Reorder   furosemide (LASIX) 40 MG tablet Reorder    Return if symptoms worsen or fail to improve, for finger swelling.    Subjective:   Encounter date: 06/10/2022  Norman Rodriguez is a 67 y.o. male who has Cardiomyopathy [facility]); CAD (coronary artery disease); Chronic anticoagulation; NSVT (nonsustained ventricular tachycardia) (HCC); Hyperlipidemia; HTN (hypertension); Chronic systolic heart failure (HCC); PAF (paroxysmal atrial fibrillation) (HCC); COPD (chronic obstructive pulmonary disease) (HCC); Ventricular tachycardia (HCC); Post-traumatic osteoarthritis of right ankle; Post-traumatic osteoarthritis of right shoulder; Toe pain, left; Carpal tunnel syndrome of left wrist; BMI 39.0-39.9,adult; and Finger swelling on their problem list..   He  has a past medical history of ACS (acute coronary syndrome) (HCC), Arthritis, Atrial fibrillation (HCC), BRBPR (bright red blood per rectum) (10/22/2017), CAD (coronary artery disease), Chronic anticoagulation, Chronic systolic CHF (congestive heart failure) (HCC), COPD (chronic obstructive pulmonary disease) (HCC) (10/22/2017), Dysrhythmia, HTN (hypertension), Hyperlipidemia, Melena, NSTEMI (non-ST elevated myocardial infarction) (HCC) (01/05/2021), NSVT (nonsustained ventricular tachycardia) (HCC), Obesity, Polyp of cecum, Polyp of descending colon, Polyp of rectum, Polyp of transverse colon, and Shortness of breath..   Chief Complaint: Follow-up for refills, evaluation of a swollen and painful left middle finger, and plan to speak with a rheumatologist regarding finger issues.  History of Present Illness:  General Concerns.  Norman Rodriguez presents with complaints primarily focusing on a swollen and painful left middle finger, ongoing since February 7th. He previously saw an orthopedist in February, where MRI and joint injection treatments were completed without significant relief. The patient expresses the  need for a rheumatologist consultation, with [name], for further  opinions due to suspected psoriatic arthritis versus osteoarthritis. He reports taking acetaminophen and over-the-counter Instaflex for pain relief, to no substantial effect.  Cardiovascular Maintenance Norman Rodriguez is currently on Entresto and furosemide for chronic systolic heart failure, among other heart-related medications. He requires refills for these prescriptions and is scheduled to see his cardiologist, Dr. Girtha Rm, on the 31st. The patient is compliant with his medication regimen, including digoxin and carvedilol, and requires bridge refills till his cardiology appointment.  Review of Systems  Constitutional:  Negative for chills, diaphoresis, fever, malaise/fatigue and weight loss.  HENT:  Negative for congestion, ear discharge, ear pain and hearing loss.   Eyes:  Negative for blurred vision, double vision, photophobia, pain, discharge and redness.  Respiratory:  Negative for cough, sputum production, shortness of breath and wheezing.   Cardiovascular:  Negative for chest pain and palpitations.  Gastrointestinal:  Negative for abdominal pain, blood in stool, constipation, diarrhea, heartburn, melena, nausea and vomiting.  Genitourinary:  Negative for dysuria, flank pain, frequency, hematuria and urgency.  Musculoskeletal:  Positive for joint pain (left middle finger). Negative for myalgias.  Skin:  Negative for itching and rash.  Neurological:  Negative for dizziness, tingling, tremors, speech change, seizures, loss of consciousness, weakness and headaches.  Psychiatric/Behavioral:  Negative for depression, hallucinations, memory loss, substance abuse and suicidal ideas. The patient does not have insomnia.   All other systems reviewed and are negative.   Past Surgical History:  Procedure Laterality Date   CARDIOVERSION N/A 04/27/2013   Procedure: BEDSIDE CARDIOVERSION;  Surgeon: Thurmon Fair, MD;  Location: [facility] OR;   Service: Cardiovascular;  Laterality: N/A;   CARDIOVERSION N/A 05/15/2013   Procedure: CARDIOVERSION/BEDSIDE;  Surgeon: Laurey Morale, MD;  Location: [facility] OR;  Service: Cardiovascular;  Laterality: N/A;   CARDIOVERSION N/A 05/16/2014   Procedure: CARDIOVERSION;  Surgeon: Jake Bathe, MD;  Location: [facility] ENDOSCOPY;  Service: Cardiovascular;  Laterality: N/A;   CARDIOVERSION N/A 06/07/2014   Procedure: CARDIOVERSION;  Surgeon: Jake Bathe, MD;  Location: [facility] ENDOSCOPY;  Service: Cardiovascular;  Laterality: N/A;   COLONOSCOPY WITH PROPOFOL N/A 10/27/2017   Procedure: COLONOSCOPY WITH PROPOFOL;  Surgeon: Napoleon Form, MD;  Location: [facility] ENDOSCOPY;  Service: Endoscopy;  Laterality: N/A;   CORONARY ANGIOPLASTY WITH STENT PLACEMENT  2010   BMS 2.80mm 23 to LAD. CTO of RCA unsuccessful, good left to right collaterals   ESOPHAGOGASTRODUODENOSCOPY (EGD) WITH PROPOFOL N/A 10/24/2017   Procedure: ESOPHAGOGASTRODUODENOSCOPY (EGD) WITH PROPOFOL;  Surgeon: Napoleon Form, MD;  Location: [facility] ENDOSCOPY;  Service: Endoscopy;  Laterality: N/A;   FOOT SURGERY Right    ICD IMPLANT N/A 01/06/2021   Procedure: ICD IMPLANT;  Surgeon: Regan Lemming, MD;  Location: [facility] INVASIVE CV LAB;  Service: Cardiovascular;  Laterality: N/A;   LEFT HEART CATH AND CORONARY ANGIOGRAPHY N/A 01/05/2021   Procedure: LEFT HEART CATH AND CORONARY ANGIOGRAPHY;  Surgeon: Runell Gess, MD;  Location: [facility] INVASIVE CV LAB;  Service: Cardiovascular;  Laterality: N/A;   POLYPECTOMY  10/27/2017   Procedure: POLYPECTOMY;  Surgeon: Napoleon Form, MD;  Location: [facility] ENDOSCOPY;  Service: Endoscopy;;    Outpatient Medications Prior to Visit  Medication Sig Dispense Refill   Acetaminophen Extra Strength 500 MG TABS TAKE 1-2 TABLETS BY MOUTH THREE (THREE) TIMES DAILY AS NEEDED. 180 tablet 0   ANORO ELLIPTA 62.5-25 MCG/INH AEPB INHALE ONE PUFFS INTO THE LUNGS DAILY 60 each 3   atorvastatin (LIPITOR) 40 MG tablet Take 1 tablet (40  mg total) by mouth  daily. 100 tablet 0   carvedilol (COREG) 25 MG tablet Take 1 tablet (25 mg total) by mouth 2 (two) times daily. 180 tablet 1   Coenzyme Q10 (CO Q-10) 200 MG CAPS Take 200 mg by mouth daily.     digoxin (LANOXIN) 0.125 MG tablet TAKE ONE TABLET BY MOUTH DAILY 90 tablet 3   fexofenadine (ALLEGRA) 180 MG tablet Take 180 mg by mouth daily.     LevOCARNitine (CARNITINE PO) Take 500 mg by mouth daily.     Multiple Vitamin (MULTIVITAMIN) tablet Take 1 tablet by mouth daily.     nitroGLYCERIN (NITROSTAT) 0.4 MG SL tablet Place 1 tablet (0.4 mg total) under the tongue every 5 (five) minutes as needed for chest pain. 25 tablet 4   NON FORMULARY Take 465 mg by mouth as needed (joint pain). HiActives Tart Cherry     Omega-3 Fatty Acids (OMEGA 3 PO) Take 1 capsule by mouth daily.     rivaroxaban (XARELTO) 20 MG TABS tablet TAKE ONE TABLET BY MOUTH DAILY WITH SUPPER 90 tablet 1   vitamin C (ASCORBIC ACID) 500 MG tablet Take 500 mg by mouth daily.     VITAMIN D, CHOLECALCIFEROL, PO Take 5,000 Units by mouth daily.     furosemide (LASIX) 40 MG tablet TAKE ONE TABLET BY MOUTH DAILY 90 tablet 1   sacubitril-valsartan (ENTRESTO) 24-26 MG Take 1 tablet by mouth 2 (two) times daily. 180 tablet 3   gabapentin (NEURONTIN) 300 MG capsule Take 2 capsules (600 mg total) by mouth at bedtime. 60 capsule 2   No facility-administered medications prior to visit.    Family History  Problem Relation Age of Onset   Other Father        Car accident   Coronary artery disease Neg Hx     Social History   Socioeconomic History   Marital status: Single    Spouse name: Not on file   Number of children: Not on file   Years of education: Not on file   Highest education level: Not on file  Occupational History   Not on file  Tobacco Use   Smoking status: Former    Types: Cigars   Smokeless tobacco: Never   Tobacco comments:    1 cigar per month states he stopped smoking  Vaping Use   Vaping Use:  Never used  Substance and Sexual Activity   Alcohol use: No   Drug use: No    Comment: 1 per month...pt states he stopped smoking   Sexual activity: Not Currently  Other Topics Concern   Not on file  Social History Narrative   Not on file   Social Determinants of Health   Financial Resource Strain: Not on file  Food Insecurity: Not on file  Transportation Needs: Not on file  Physical Activity: Not on file  Stress: Not on file  Social Connections: Not on file  Intimate Partner Violence: Not on file  Objective:  Physical Exam: BP 132/86 (BP Location: Left Arm, Patient Position: Sitting, Cuff Size: Large)   Pulse 83   Ht 5\' 8"  (1.727 m)   Wt 250 lb 3.2 oz (113.5 kg)   SpO2 96%   BMI 38.04 kg/m     Physical Exam Constitutional:      Appearance: Normal appearance.  HENT:     Head: Normocephalic and atraumatic.     Right Ear: Hearing normal.     Left Ear: Hearing normal.     Nose: Nose normal.  Eyes:     General: No scleral icterus.       Right eye: No discharge.        Left eye: No discharge.     Extraocular Movements: Extraocular movements intact.  Cardiovascular:     Rate and Rhythm: Normal rate.     Heart sounds: Normal heart sounds.  Pulmonary:     Effort: Pulmonary effort is normal.     Breath sounds: Normal breath sounds.  Abdominal:     Palpations: Abdomen is soft.     Tenderness: There is no abdominal tenderness.  Musculoskeletal:     Left hand: Swelling and tenderness (Left third MCP) present.  Skin:    General: Skin is warm.     Findings: No rash.  Neurological:     General: No focal deficit present.     Mental Status: He is alert.     Cranial Nerves: No cranial nerve deficit.  Psychiatric:        Mood and Affect: Mood normal.        Behavior: Behavior normal.        Thought Content: Thought content normal.        Judgment: Judgment normal.      CUP PACEART REMOTE DEVICE CHECK  Result Date: 04/07/2022 Scheduled remote reviewed. Normal device function.  1 NSVT, no therapy Next remote 91 days. LA   Recent Results (from the past 2160 hour(s))  CUP PACEART REMOTE DEVICE CHECK     Status: None   Collection Time: 04/05/22  3:00 AM  Result Value Ref Range   Date Time Interrogation Session [id number]    Pulse Generator Manufacturer [facility]    Pulse Gen Model [id number] [place] VR    Pulse Gen Serial Number [id number]    Clinic Name [facility]    Implantable Pulse Generator Type Implantable Cardiac Defibulator    Implantable Pulse Generator Implant Date 24401027    Implantable Lead Manufacturer [facility]    Implantable Lead Model [id number] Durata SJ4    Implantable Lead Serial Number [id number]    Implantable Lead Implant Date 44034742    Implantable Lead Location Detail 1 APEX    Implantable Lead Location [id number]    Implantable Lead Connection Status [id number]    Lead Channel Setting Sensing Sensitivity 0.5 mV   Lead Channel Setting Sensing Adaptation Mode Adaptive Sensing    Lead Channel Setting Pacing Pulse Width 0.5 ms   Lead Channel Setting Pacing Amplitude 2.5 V   Zone Setting Status Active    Zone Setting Status Active    Zone Setting Status Active    Lead Channel Status NULL    Lead Channel Impedance Value 410 ohm   Lead Channel Sensing Intrinsic Amplitude 10.4 mV   Lead Channel Pacing Threshold Amplitude 0.5 V   Lead Channel Pacing Threshold Pulse Width 0.5 ms   HighPow Impedance 71 ohm   HighPow Imped Status NULL    Battery Status MOS  Battery Remaining Longevity 110 mo   Battery Remaining Percentage 89.0 %   Battery Voltage 3.01 V   Brady Statistic RV Percent Paced 2.0 %  Uric acid     Status: Abnormal   Collection Time: 06/10/22  3:16 PM  Result Value Ref Range   Uric Acid, Serum 8.2 (H) 4.0 - 7.8 mg/dL  Sedimentation rate     Status: None   Collection Time: 06/10/22  3:16 PM  Result Value Ref Range   Sed  Rate 16 0 - 20 mm/hr  C-reactive protein     Status: None   Collection Time: 06/10/22  3:16 PM  Result Value Ref Range   CRP <1.0 0.5 - 20.0 mg/dL  Arthritis Panel     Status: None (Preliminary result)   Collection Time: 06/10/22  3:16 PM  Result Value Ref Range   Uric Acid 8.2 3.8 - 8.4 mg/dL    Comment:            Therapeutic target for gout patients: <6.0   Anti Nuclear Antibody (ANA) WILL FOLLOW    Rheumatoid fact SerPl-aCnc <10.0 <14.0 IU/mL   Sed Rate 15 0 - 30 mm/hr  Rheumatoid Arthritis Profile     Status: None (Preliminary result)   Collection Time: 06/10/22  3:16 PM  Result Value Ref Range   Cyclic Citrullin Peptide Ab WILL FOLLOW   HLA-B27 antigen     Status: None   Collection Time: 06/10/22  3:16 PM  Result Value Ref Range   HLA-B27 Antigen NEGATIVE NEGATIVE        Garner Nash, MD, MS

## 2022-06-11 ENCOUNTER — Telehealth: Payer: Self-pay | Admitting: Family Medicine

## 2022-06-11 DIAGNOSIS — M1A0490 Idiopathic chronic gout, unspecified hand, without tophus (tophi): Secondary | ICD-10-CM

## 2022-06-11 LAB — HLA-B27 ANTIGEN: HLA-B27 Antigen: NEGATIVE

## 2022-06-11 LAB — SEDIMENTATION RATE: Sed Rate: 16 mm/hr (ref 0–20)

## 2022-06-11 LAB — URIC ACID: Uric Acid, Serum: 8.2 mg/dL — ABNORMAL HIGH (ref 4.0–7.8)

## 2022-06-11 LAB — C-REACTIVE PROTEIN: CRP: <1 mg/dL (ref 0.5–20.0)

## 2022-06-11 NOTE — Telephone Encounter (Signed)
Pt is requesting a referral for his hand, finger. He would like the referral to be at [facility]  # [phone]. This is all the information he had. Please let him know.

## 2022-06-13 DIAGNOSIS — M7989 Other specified soft tissue disorders: Secondary | ICD-10-CM | POA: Insufficient documentation

## 2022-06-13 NOTE — Assessment & Plan Note (Signed)
History of persistent swelling and pain in the left middle finger, previously unresponsive to joint injection and daily acetaminophen usage. Patient suspects psoriatic arthritis but requires further evaluation by rheumatology.  Differential Diagnosis:  Psoriatic Arthritis Osteoarthritis Gout Infection Rheumatoid Arthritis  Plan: Order blood work including uric acid, ESR, CRP, Rheumatoid Arthritis panel, ANA reflex, and HLA-B27 antigen. Start Colchicine, with dosing as follows: take 2 capsules today, then 1 capsule an hour later, and if still having swelling, take 1 capsule daily for up to 7 days. Patient to follow up with rheumatology, plans to schedule appointment with [name].

## 2022-06-13 NOTE — Assessment & Plan Note (Signed)
Patient has ongoing issues regarding chronic systolic heart failure and remains on medications including Entresto and furosemide. Scheduled to see cardiologist soon for continued management.  Plan:  Refill Entresto 24/26mg , twice daily for 30 days until cardiology appointment. Refill furosemide 40mg , daily for 30 days until cardiology appointment. Continue with current medications including carvedilol and digoxin. Follow up with the cardiologist on the 31st of this month for further evaluation.

## 2022-06-14 NOTE — Telephone Encounter (Signed)
Pt wanted me to add that the Dr's name is [name].

## 2022-06-15 LAB — ARTHRITIS PANEL
Anti Nuclear Antibody (ANA): NEGATIVE
Rheumatoid fact SerPl-aCnc: <10 IU/mL (ref <14.0)
Sed Rate: 15 mm/hr (ref 0–30)
Uric Acid: 8.2 mg/dL (ref 3.8–8.4)

## 2022-06-15 LAB — RHEUMATOID ARTHRITIS PROFILE: Cyclic Citrullin Peptide Ab: 7 units (ref 0–19)

## 2022-06-15 NOTE — Telephone Encounter (Signed)
Please advise in Dr. Carollee Massed absence.  Can you please place referral to [facility], [name], PA, at [address] [place],  [place]  [address].

## 2022-06-16 DIAGNOSIS — M1A0490 Idiopathic chronic gout, unspecified hand, without tophus (tophi): Secondary | ICD-10-CM | POA: Insufficient documentation

## 2022-06-18 ENCOUNTER — Telehealth: Payer: Self-pay | Admitting: Cardiology

## 2022-06-18 NOTE — Telephone Encounter (Signed)
Pt returning call

## 2022-06-18 NOTE — Telephone Encounter (Signed)
Left message for the patient to call the clinic.

## 2022-06-18 NOTE — Telephone Encounter (Signed)
Patient stated he has been having diarrhea for the last three days and wants call back to discuss what medication he can take.

## 2022-06-25 ENCOUNTER — Ambulatory Visit: Payer: 59 | Attending: Cardiology | Admitting: Cardiology

## 2022-06-25 ENCOUNTER — Encounter: Payer: Self-pay | Admitting: Cardiology

## 2022-06-25 VITALS — BP 100/62 | HR 51 | Ht 68.0 in | Wt 249.4 lb

## 2022-06-25 DIAGNOSIS — I5022 Chronic systolic (congestive) heart failure: Secondary | ICD-10-CM

## 2022-06-25 DIAGNOSIS — Z79899 Other long term (current) drug therapy: Secondary | ICD-10-CM

## 2022-06-25 DIAGNOSIS — I4821 Permanent atrial fibrillation: Secondary | ICD-10-CM | POA: Diagnosis not present

## 2022-06-25 DIAGNOSIS — I10 Essential (primary) hypertension: Secondary | ICD-10-CM | POA: Diagnosis not present

## 2022-06-25 DIAGNOSIS — E785 Hyperlipidemia, unspecified: Secondary | ICD-10-CM

## 2022-06-25 NOTE — Telephone Encounter (Signed)
Pt is scheduled to see Dr Anne Fu this afternoon. (06/25/22)

## 2022-06-25 NOTE — Patient Instructions (Signed)
Medication Instructions:  The current medical regimen is effective;  continue present plan and medications.  *If you need a refill on your cardiac medications before your next appointment, please call your pharmacy*   Lab Work: Please have blood work today (CBC, CMP, Lipid and Dig)  If you have labs (blood work) drawn today and your tests are completely normal, you will receive your results only by: MyChart Message (if you have MyChart) OR A paper copy in the mail If you have any lab test that is abnormal or we need to change your treatment, we will call you to review the results.   Follow-Up: At [facility], you and your health needs are our priority.  As part of our continuing mission to provide you with exceptional heart care, we have created designated Provider Care Teams.  These Care Teams include your primary Cardiologist (physician) and Advanced Practice Providers (APPs -  Physician Assistants and Nurse Practitioners) who all work together to provide you with the care you need, when you need it.  We recommend signing up for the patient portal called "MyChart".  Sign up information is provided on this After Visit Summary.  MyChart is used to connect with patients for Virtual Visits (Telemedicine).  Patients are able to view lab/test results, encounter notes, upcoming appointments, etc.  Non-urgent messages can be sent to your provider as well.   To learn more about what you can do with MyChart, go to [email or web address].    Your next appointment:   1 year(s)  Provider:   Donato Schultz, MD

## 2022-06-25 NOTE — Progress Notes (Signed)
Cardiology Office Note:    Date:  06/25/2022   ID:  Norman Rodriguez, DOB 1955-12-30, MRN [id number]  PCP:  Garnette Gunner, MD   [facility] Providers Cardiologist:  Donato Schultz, MD Electrophysiologist:  Lewayne Bunting, MD     Referring MD: Garnette Gunner, MD    History of Present Illness:    Norman Rodriguez is a 67 y.o. male with chronic systolic heart failure, ICD, prior VT here for follow-up.  Overall doing well.  Still getting shortness of breath with activity which is fairly common for him.  NYHA class II symptoms.  No syncope.  No recent shocks.  Reviewed EP note.  Settings changed on defibrillator.  Overall doing quite well without any fevers chills nausea vomiting.  He does have joint pain middle left finger.  Has had an injection.  He is going to see rheumatology.  No syncope no chest pain.  He did have a recent bout of food poisoning.  Past Medical History:  Diagnosis Date   ACS (acute coronary syndrome) (HCC)    Arthritis    Atrial fibrillation (HCC)    a. Dx 2010. b. Initiated on Tikosyn 03/2013.   BRBPR (bright red blood per rectum) 10/22/2017   CAD (coronary artery disease)    a. BMS 2.58mm 23 to LAD. CTO of RCA unsuccessful, good left to right collaterals - 2010.   Chronic anticoagulation    Chronic systolic CHF (congestive heart failure) (HCC)    a. Both ischemic and tachy induced. b. Echo 04/2013: EF 30-35%, no RWMA, normal RV, mod dilated LA (before restoration of NSR).   COPD (chronic obstructive pulmonary disease) (HCC) 10/22/2017   Dysrhythmia    hx of atrial fibrilation   HTN (hypertension)    Hyperlipidemia    Melena    NSTEMI (non-ST elevated myocardial infarction) (HCC) 01/05/2021   NSVT (nonsustained ventricular tachycardia) (HCC)    Obesity    Polyp of cecum    Polyp of descending colon    Polyp of rectum    Polyp of transverse colon    Shortness of breath     Past Surgical History:  Procedure Laterality Date   CARDIOVERSION N/A  04/27/2013   Procedure: BEDSIDE CARDIOVERSION;  Surgeon: Thurmon Fair, MD;  Location: [facility] OR;  Service: Cardiovascular;  Laterality: N/A;   CARDIOVERSION N/A 05/15/2013   Procedure: CARDIOVERSION/BEDSIDE;  Surgeon: Laurey Morale, MD;  Location: [facility] OR;  Service: Cardiovascular;  Laterality: N/A;   CARDIOVERSION N/A 05/16/2014   Procedure: CARDIOVERSION;  Surgeon: Jake Bathe, MD;  Location: [facility] ENDOSCOPY;  Service: Cardiovascular;  Laterality: N/A;   CARDIOVERSION N/A 06/07/2014   Procedure: CARDIOVERSION;  Surgeon: Jake Bathe, MD;  Location: [facility] ENDOSCOPY;  Service: Cardiovascular;  Laterality: N/A;   COLONOSCOPY WITH PROPOFOL N/A 10/27/2017   Procedure: COLONOSCOPY WITH PROPOFOL;  Surgeon: Napoleon Form, MD;  Location: [facility] ENDOSCOPY;  Service: Endoscopy;  Laterality: N/A;   CORONARY ANGIOPLASTY WITH STENT PLACEMENT  2010   BMS 2.75mm 23 to LAD. CTO of RCA unsuccessful, good left to right collaterals   ESOPHAGOGASTRODUODENOSCOPY (EGD) WITH PROPOFOL N/A 10/24/2017   Procedure: ESOPHAGOGASTRODUODENOSCOPY (EGD) WITH PROPOFOL;  Surgeon: Napoleon Form, MD;  Location: [facility] ENDOSCOPY;  Service: Endoscopy;  Laterality: N/A;   FOOT SURGERY Right    ICD IMPLANT N/A 01/06/2021   Procedure: ICD IMPLANT;  Surgeon: Regan Lemming, MD;  Location: [facility] INVASIVE CV LAB;  Service: Cardiovascular;  Laterality: N/A;   LEFT HEART CATH AND CORONARY  ANGIOGRAPHY N/A 01/05/2021   Procedure: LEFT HEART CATH AND CORONARY ANGIOGRAPHY;  Surgeon: Runell Gess, MD;  Location: [facility] INVASIVE CV LAB;  Service: Cardiovascular;  Laterality: N/A;   POLYPECTOMY  10/27/2017   Procedure: POLYPECTOMY;  Surgeon: Napoleon Form, MD;  Location: [facility] ENDOSCOPY;  Service: Endoscopy;;    Current Medications: Current Meds  Medication Sig   Acetaminophen Extra Strength 500 MG TABS TAKE 1-2 TABLETS BY MOUTH THREE (THREE) TIMES DAILY AS NEEDED.   ANORO ELLIPTA 62.5-25 MCG/INH AEPB INHALE ONE PUFFS INTO THE LUNGS DAILY    atorvastatin (LIPITOR) 40 MG tablet Take 1 tablet (40 mg total) by mouth daily.   carvedilol (COREG) 25 MG tablet Take 1 tablet (25 mg total) by mouth 2 (two) times daily.   Coenzyme Q10 (CO Q-10) 200 MG CAPS Take 200 mg by mouth daily.   Colchicine 0.6 MG CAPS Day 1: Take 2 caps, then 1 cap an hour later. If still having swelling, may take 1 cap daily for up to 7 days   digoxin (LANOXIN) 0.125 MG tablet TAKE ONE TABLET BY MOUTH DAILY   fexofenadine (ALLEGRA) 180 MG tablet Take 180 mg by mouth daily.   furosemide (LASIX) 40 MG tablet Take 1 tablet (40 mg total) by mouth daily.   LevOCARNitine (CARNITINE PO) Take 500 mg by mouth daily.   Multiple Vitamin (MULTIVITAMIN) tablet Take 1 tablet by mouth daily.   nitroGLYCERIN (NITROSTAT) 0.4 MG SL tablet Place 1 tablet (0.4 mg total) under the tongue every 5 (five) minutes as needed for chest pain.   NON FORMULARY Take 465 mg by mouth as needed (joint pain). HiActives Tart Cherry   Omega-3 Fatty Acids (OMEGA 3 PO) Take 1 capsule by mouth daily.   OVER THE COUNTER MEDICATION Take 1 capsule by mouth daily at 6 (six) AM. INSTAFLEX SUPPLEMENT for joint pain.   rivaroxaban (XARELTO) 20 MG TABS tablet TAKE ONE TABLET BY MOUTH DAILY WITH SUPPER   sacubitril-valsartan (ENTRESTO) 24-26 MG Take 1 tablet by mouth 2 (two) times daily.   vitamin C (ASCORBIC ACID) 500 MG tablet Take 500 mg by mouth daily.   VITAMIN D, CHOLECALCIFEROL, PO Take 5,000 Units by mouth daily.     Allergies:   Apple juice, Soybean oil, Lisinopril, Other, Bean pod extract, Carrot [daucus carota], Grass extracts [gramineae pollens], Peach flavor, and Tree extract   Social History   Socioeconomic History   Marital status: Single    Spouse name: Not on file   Number of children: Not on file   Years of education: Not on file   Highest education level: Not on file  Occupational History   Not on file  Tobacco Use   Smoking status: Former    Types: Cigars   Smokeless tobacco: Never    Tobacco comments:    1 cigar per month states he stopped smoking  Vaping Use   Vaping Use: Never used  Substance and Sexual Activity   Alcohol use: No   Drug use: No    Comment: 1 per month...pt states he stopped smoking   Sexual activity: Not Currently  Other Topics Concern   Not on file  Social History Narrative   Not on file   Social Determinants of Health   Financial Resource Strain: Not on file  Food Insecurity: Not on file  Transportation Needs: Not on file  Physical Activity: Not on file  Stress: Not on file  Social Connections: Not on file     Recent Labs:  No results found for requested labs within last 365 days.  Recent Lipid Panel    Component Value Date/Time   CHOL 134 01/05/2021 0456   CHOL 230 (H) 09/25/2018 1516   TRIG 109 01/05/2021 0456   HDL 51 01/05/2021 0456   HDL 57 09/25/2018 1516   CHOLHDL 2.6 01/05/2021 0456   VLDL 22 01/05/2021 0456   LDLCALC 61 01/05/2021 0456   LDLCALC 146 (H) 09/25/2018 1516     Risk Assessment/Calculations:    Cardiac Studies & Procedures   CARDIAC CATHETERIZATION  CARDIAC CATHETERIZATION 01/05/2021  Narrative Images from the original result were not included.    Prox Cx to Mid Cx lesion is 70% stenosed.   Prox RCA to Mid RCA lesion is 90% stenosed.   Mid RCA lesion is 100% stenosed.   Previously placed Prox LAD to Mid LAD stent (unknown type) is  widely patent.   There is severe left ventricular systolic dysfunction.   LV end diastolic pressure is mildly elevated.   The left ventricular ejection fraction is 25-35% by visual estimate.  Norman Rodriguez is a 67 y.o. male   [id number] LOCATION:  FACILITY: [facility] PHYSICIAN: Nanetta Batty, M.D. 1955-12-16   DATE OF PROCEDURE:  01/05/2021  DATE OF DISCHARGE:     CARDIAC CATHETERIZATION    History obtained from chart review.67 y.o. male  with hx of CAD s/p PCI to LAD, HFrEF, permanent atrial fibrillation on Xarelto, COPD, and essential hypertension  who is being seen 01/05/2021 for the evaluation of NSTEMI.  He presented with some chest pain and monomorphic sustained ventricular tachycardia requiring defibrillation.  Her troponins were elevated at 600.   PROCEDURE DESCRIPTION:  The patient was brought to the second floor [facility] cath lab in the postabsorptive state. He was not premedicated. His right wrist was prepped and shaved in usual sterile fashion. Xylocaine 1% was used for local anesthesia. A 6 French sheath was inserted into the right radial artery using standard Seldinger technique.  Ultrasound was used to guide access.  Digital image was captured and placed the patient's chart.  The patient received 6000 units  of heparin intravenously.  A 5 [place] TIG catheter and pigtail catheter were used for selective coronary angiography and left ventriculography respectively.  Isovue dye was used for the entire to the case (60 cc of contrast total to patient).  Retrograde aortic and left ventricular and pullback pressures were recorded.  Radial cocktail was administered via the SideArm sheath.  Impression Mr. Bowling had a patent proximal LAD stent placed by Dr. Eldridge Dace 11/07/2008.  He has an RCA CTO which is old as well with left-to-right collaterals.  The circumflex is small and diffusely diseased.  His LVEDP is mildly elevated and his EF is severely reduced.  I suspect this was a primary arrhythmogenic event.  He will need 2D echocardiography, and EP consult for ICD implantation and probably an antiarrhythmic medication along with guideline directed optimal medical therapy for his ischemic cardiomyopathy.  Heparin will be restarted 4 hours after sheath removal and Xarelto will be restarted once timing of ICD implantation is formalized.  The sheath was removed and a TR band was placed on the right wrist to achieve patent hemostasis.  The patient left lab in stable condition.  Dr. Garnette Scheuermann, the attending cardiologist, was notified of these  results.  Nanetta Batty. MD, [facility] 01/05/2021 8:49 AM  Findings Coronary Findings Diagnostic  Dominance: Right  Left Anterior Descending Previously placed Prox LAD to Mid  LAD stent (unknown type) is  widely patent.  Left Circumflex Prox Cx to Mid Cx lesion is 70% stenosed.  Right Coronary Artery Prox RCA to Mid RCA lesion is 90% stenosed. Mid RCA lesion is 100% stenosed.  Right Posterior Atrioventricular Artery Collaterals RPAV filled by collaterals from Dist LAD.  Intervention  No interventions have been documented.     ECHOCARDIOGRAM  ECHOCARDIOGRAM COMPLETE 01/06/2021  Narrative ECHOCARDIOGRAM REPORT    Patient Name:   Norman Rodriguez Date of Exam: 01/06/2021 Medical Rec #:  [id number]         Height:       68.0 in Accession #:    [id number]        Weight:       260.0 lb Date of Birth:  Feb 03, 1955         BSA:          2.285 m Patient Age:    65 years          BP:           127/83 mmHg Patient Gender: M                 HR:           75 bpm. Exam Location:  Inpatient  Procedure: 2D Echo, Cardiac Doppler, Color Doppler and Intracardiac Opacification Agent  Indications:    NSTEMI I21.4  History:        Patient has prior history of Echocardiogram examinations, most recent 03/21/2017. CHF, CAD, COPD, Arrythmias:Atrial Fibrillation, Signs/Symptoms:Shortness of Breath; Risk Factors:Dyslipidemia and Hypertension.  Sonographer:    Eulah Pont RDCS Referring Phys: [phone] JONATHAN J BERRY  IMPRESSIONS   1. There is no left ventricular thrombus. Left ventricular ejection fraction, by estimation, is 30 to 35%. The left ventricle has moderately decreased function. The left ventricle demonstrates regional wall motion abnormalities (see scoring diagram/findings for description). The left ventricular internal cavity size was mildly dilated. Left ventricular diastolic function could not be evaluated. There is akinesis of the left ventricular, entire inferior wall,  inferolateral wall and inferoseptal wall. 2. Right ventricular systolic function is mildly reduced. The right ventricular size is normal. There is moderately elevated pulmonary artery systolic pressure. 3. Left atrial size was moderately dilated. 4. Right atrial size was mildly dilated. 5. The mitral valve is normal in structure. Mild mitral valve regurgitation. 6. The aortic valve is tricuspid. Aortic valve regurgitation is not visualized. Aortic valve sclerosis is present, with no evidence of aortic valve stenosis. 7. The inferior vena cava is dilated in size with <50% respiratory variability, suggesting right atrial pressure of 15 mmHg.  Comparison(s): No significant change from prior study. Prior images reviewed side by side.  FINDINGS Left Ventricle: There is no left ventricular thrombus. Left ventricular ejection fraction, by estimation, is 30 to 35%. The left ventricle has moderately decreased function. The left ventricle demonstrates regional wall motion abnormalities. Definity contrast agent was given IV to delineate the left ventricular endocardial borders. The left ventricular internal cavity size was mildly dilated. There is no left ventricular hypertrophy. Left ventricular diastolic function could not be evaluated due to atrial fibrillation. Left ventricular diastolic function could not be evaluated.   LV Wall Scoring: The entire inferior wall, posterior wall, mid inferoseptal segment, and basal inferoseptal segment are akinetic.  Right Ventricle: The right ventricular size is normal. No increase in right ventricular wall thickness. Right ventricular systolic function is mildly reduced. There is moderately elevated pulmonary artery systolic pressure. The  tricuspid regurgitant velocity is 3.63 m/s, and with an assumed right atrial pressure of 15 mmHg, the estimated right ventricular systolic pressure is 67.7 mmHg.  Left Atrium: Left atrial size was moderately dilated.  Right  Atrium: Right atrial size was mildly dilated.  Pericardium: Trivial pericardial effusion is present.  Mitral Valve: The mitral valve is normal in structure. Mild mitral valve regurgitation, with centrally-directed jet.  Tricuspid Valve: The tricuspid valve is normal in structure. Tricuspid valve regurgitation is trivial.  Aortic Valve: The aortic valve is tricuspid. Aortic valve regurgitation is not visualized. Aortic valve sclerosis is present, with no evidence of aortic valve stenosis.  Pulmonic Valve: The pulmonic valve was not well visualized. Pulmonic valve regurgitation is not visualized.  Aorta: The aortic root and ascending aorta are structurally normal, with no evidence of dilitation.  Venous: The inferior vena cava is dilated in size with less than 50% respiratory variability, suggesting right atrial pressure of 15 mmHg.  IAS/Shunts: No atrial level shunt detected by color flow Doppler.   LEFT VENTRICLE PLAX 2D LVIDd:         6.20 cm LVIDs:         5.73 cm LV PW:         0.90 cm LV IVS:        0.80 cm LVOT diam:     2.10 cm LV SV:         51 LV SV Index:   22 LVOT Area:     3.46 cm  LV Volumes (MOD) LV vol d, MOD A2C: 271.0 ml LV vol d, MOD A4C: 196.0 ml LV vol s, MOD A2C: 193.0 ml LV vol s, MOD A4C: 138.0 ml LV SV MOD A2C:     78.0 ml LV SV MOD A4C:     196.0 ml LV SV MOD BP:      70.4 ml  RIGHT VENTRICLE TAPSE (M-mode): 1.5 cm  LEFT ATRIUM             Index        RIGHT ATRIUM           Index LA diam:        4.60 cm 2.01 cm/m   RA Area:     20.10 cm LA Vol (A2C):   63.1 ml 27.62 ml/m  RA Volume:   61.80 ml  27.05 ml/m LA Vol (A4C):   65.8 ml 28.80 ml/m LA Biplane Vol: 70.1 ml 30.68 ml/m AORTIC VALVE LVOT Vmax:   78.77 cm/s LVOT Vmean:  51.533 cm/s LVOT VTI:    0.147 m  AORTA Ao Root diam: 3.30 cm Ao Asc diam:  3.50 cm  MR Peak grad:    91.0 mmHg    TRICUSPID VALVE MR Mean grad:    69.0 mmHg    TR Peak grad:   52.7 mmHg MR Vmax:          477.00 cm/s  TR Vmax:        363.00 cm/s MR Vmean:        402.0 cm/s MR PISA:         0.57 cm     SHUNTS MR PISA Eff ROA: 4 mm        Systemic VTI:  0.15 m MR PISA Radius:  0.30 cm      Systemic Diam: 2.10 cm  Rachelle Hora Croitoru MD Electronically signed by Thurmon Fair MD Signature Date/Time: 01/06/2021/12:11:58 PM    Final  Physical Exam:    VS:  BP 100/62   Pulse (!) 51   Ht 5\' 8"  (1.727 m)   Wt 249 lb 6.4 oz (113.1 kg)   SpO2 96%   BMI 37.92 kg/m     Wt Readings from Last 3 Encounters:  06/25/22 249 lb 6.4 oz (113.1 kg)  06/10/22 250 lb 3.2 oz (113.5 kg)  08/28/21 268 lb 3.2 oz (121.7 kg)     GEN:  Well nourished, well developed in no acute distress HEENT: Normal NECK: No JVD; No carotid bruits LYMPHATICS: No lymphadenopathy CARDIAC: Irregularly irregular bradycardic, no murmurs, no rubs, gallops RESPIRATORY:  Clear to auscultation without rales, wheezing or rhonchi  ABDOMEN: Soft, non-tender, non-distended MUSCULOSKELETAL:  No edema; No deformity  SKIN: Warm and dry NEUROLOGIC:  Alert and oriented x 3 PSYCHIATRIC:  Normal affect   ASSESSMENT:    1. Medication management   2. Permanent atrial fibrillation (HCC)   3. Chronic systolic (congestive) heart failure (HCC)   4. Primary hypertension   5. Hyperlipidemia, unspecified hyperlipidemia type     PLAN:    In order of problems listed above:  Chronic systolic heart failure-ICD-EF 30 to 35% on 01/06/2021 --Medications reviewed as above.  No change in prescription drug management.  These include Entresto as well as digoxin.  Carvedilol 12.5 twice a day.  Excellent.  No changes made.  Prior creatinine 0.9 hemoglobin 14.6 TSH 1.3 ALT 36.  LDL 61.  Gone ahead and check labs again today.  Paroxysmal atrial fibrillation - Continue with Xarelto for anticoagulation.  Checking labs.  Hyperlipidemia - On atorvastatin 40 mg.  Excellent.  At goal.  Checking labs.  No myalgias.  Coronary  artery disease 12/2020 Cath:Diagnostic Dominance: Right  Knuckle pain-going to see rheumatology.  Could be gout.   Medication Adjustments/Labs and Tests Ordered: Current medicines are reviewed at length with the patient today.  Concerns regarding medicines are outlined above.  Orders Placed This Encounter  Procedures   Comprehensive metabolic panel   CBC   Digoxin level   Lipid panel   EKG 12-Lead   No orders of the defined types were placed in this encounter.   Patient Instructions  Medication Instructions:  The current medical regimen is effective;  continue present plan and medications.  *If you need a refill on your cardiac medications before your next appointment, please call your pharmacy*   Lab Work: Please have blood work today (CBC, CMP, Lipid and Dig)  If you have labs (blood work) drawn today and your tests are completely normal, you will receive your results only by: MyChart Message (if you have MyChart) OR A paper copy in the mail If you have any lab test that is abnormal or we need to change your treatment, we will call you to review the results.   Follow-Up: At [facility], you and your health needs are our priority.  As part of our continuing mission to provide you with exceptional heart care, we have created designated Provider Care Teams.  These Care Teams include your primary Cardiologist (physician) and Advanced Practice Providers (APPs -  Physician Assistants and Nurse Practitioners) who all work together to provide you with the care you need, when you need it.  We recommend signing up for the patient portal called "MyChart".  Sign up information is provided on this After Visit Summary.  MyChart is used to connect with patients for Virtual Visits (Telemedicine).  Patients are able to view lab/test results, encounter notes, upcoming  appointments, etc.  Non-urgent messages can be sent to your provider as well.   To learn more about what you can do  with MyChart, go to [email or web address].    Your next appointment:   1 year(s)  Provider:   Donato Schultz, MD        Signed, Donato Schultz, MD  06/25/2022 4:28 PM    [facility]

## 2022-06-26 LAB — CBC
Hematocrit: 44.8 % (ref 37.5–51.0)
Hemoglobin: 15.1 g/dL (ref 13.0–17.7)
MCH: 31.3 pg (ref 26.6–33.0)
MCHC: 33.7 g/dL (ref 31.5–35.7)
MCV: 93 fL (ref 79–97)
Platelets: 182 10*3/uL (ref 150–450)
RBC: 4.82 x10E6/uL (ref 4.14–5.80)
RDW: 12.9 % (ref 11.6–15.4)
WBC: 7.5 10*3/uL (ref 3.4–10.8)

## 2022-06-26 LAB — COMPREHENSIVE METABOLIC PANEL
ALT: 32 IU/L (ref 0–44)
AST: 25 IU/L (ref 0–40)
Albumin/Globulin Ratio: 1.5 (ref 1.2–2.2)
Albumin: 4.3 g/dL (ref 3.9–4.9)
Alkaline Phosphatase: 65 IU/L (ref 44–121)
BUN/Creatinine Ratio: 12 (ref 10–24)
BUN: 11 mg/dL (ref 8–27)
Bilirubin Total: 1.4 mg/dL — ABNORMAL HIGH (ref 0.0–1.2)
CO2: 26 mmol/L (ref 20–29)
Calcium: 9.5 mg/dL (ref 8.6–10.2)
Chloride: 100 mmol/L (ref 96–106)
Creatinine, Ser: 0.95 mg/dL (ref 0.76–1.27)
Globulin, Total: 2.8 g/dL (ref 1.5–4.5)
Glucose: 105 mg/dL — ABNORMAL HIGH (ref 70–99)
Potassium: 4.8 mmol/L (ref 3.5–5.2)
Sodium: 139 mmol/L (ref 134–144)
Total Protein: 7.1 g/dL (ref 6.0–8.5)
eGFR: 87 mL/min/{1.73_m2} (ref >59)

## 2022-06-26 LAB — LIPID PANEL
Chol/HDL Ratio: 3.3 ratio (ref 0.0–5.0)
Cholesterol, Total: 145 mg/dL (ref 100–199)
HDL: 44 mg/dL (ref >39)
LDL Chol Calc (NIH): 77 mg/dL (ref 0–99)
Triglycerides: 138 mg/dL (ref 0–149)
VLDL Cholesterol Cal: 24 mg/dL (ref 5–40)

## 2022-06-26 LAB — DIGOXIN LEVEL: Digoxin, Serum: 1 ng/mL — ABNORMAL HIGH (ref 0.5–0.9)

## 2022-07-05 ENCOUNTER — Ambulatory Visit (INDEPENDENT_AMBULATORY_CARE_PROVIDER_SITE_OTHER): Payer: 59

## 2022-07-05 DIAGNOSIS — I5022 Chronic systolic (congestive) heart failure: Secondary | ICD-10-CM

## 2022-07-05 DIAGNOSIS — I255 Ischemic cardiomyopathy: Secondary | ICD-10-CM

## 2022-07-06 LAB — CUP PACEART REMOTE DEVICE CHECK
Battery Remaining Longevity: 107 mo
Battery Remaining Percentage: 87 %
Battery Voltage: 3.01 V
Brady Statistic RV Percent Paced: 2.4 %
Date Time Interrogation Session: 20240610020638
HighPow Impedance: 68 Ohm
Implantable Lead Connection Status: 753985
Implantable Lead Implant Date: 20221213
Implantable Lead Location: 753860
Implantable Pulse Generator Implant Date: 20221213
Lead Channel Impedance Value: 390 Ohm
Lead Channel Pacing Threshold Amplitude: 0.75 V
Lead Channel Pacing Threshold Pulse Width: 0.5 ms
Lead Channel Sensing Intrinsic Amplitude: 10.7 mV
Lead Channel Setting Pacing Amplitude: 2.5 V
Lead Channel Setting Pacing Pulse Width: 0.5 ms
Lead Channel Setting Sensing Sensitivity: 0.5 mV
Pulse Gen Serial Number: 810054243

## 2022-07-08 ENCOUNTER — Ambulatory Visit (INDEPENDENT_AMBULATORY_CARE_PROVIDER_SITE_OTHER): Payer: 59 | Admitting: Nurse Practitioner

## 2022-07-08 ENCOUNTER — Encounter: Payer: Self-pay | Admitting: Nurse Practitioner

## 2022-07-08 VITALS — BP 108/64 | HR 81 | Temp 97.1°F | Ht 68.0 in | Wt 249.2 lb

## 2022-07-08 DIAGNOSIS — M1A0420 Idiopathic chronic gout, left hand, without tophus (tophi): Secondary | ICD-10-CM

## 2022-07-08 MED ORDER — ALLOPURINOL 100 MG PO TABS: 100.0000 mg | ORAL_TABLET | Freq: Every day | ORAL | 2 refills | Status: DC

## 2022-07-08 NOTE — Assessment & Plan Note (Signed)
Chronic, ongoing.  Uric acid levels were elevated at 8.2.  He states the colchicine did help with the pain.  There is still some swelling to his left middle finger.  Will have him start allopurinol 100 mg daily.  Discussed limiting foods that are high in purine, attached information on a low purine diet.  Follow-up at next scheduled appointment with PCP.

## 2022-07-08 NOTE — Progress Notes (Signed)
   Established Patient Office Visit  Subjective   Patient ID: Norman Rodriguez, male    DOB: [birth year]  Age: 67 y.o. MRN: [id number]  Chief Complaint  Patient presents with   Discuss Lab Work    Go over recent lab work    HPI  Norman Rodriguez is here to discuss recent labs.  Norman Rodriguez states that he started having left middle finger pain after eating shrimp back in February.  He had an x-ray done which showed slight deviation and possible crystalline deposit.  He had a steroid injection done which did not help with the pain.  Recent labs did show an elevated uric acid level and he was treated with colchicine which did help with the pain.  He has been reading up on gout and about foods to avoid to decrease purines.     ROS See pertinent positives and negatives per HPI.    Objective:     BP 108/64 (BP Location: Left Arm)   Pulse 81   Temp (!) 97.1 F (36.2 C)   Ht 5\' 8"  (1.727 m)   Wt 249 lb 3.2 oz (113 kg)   SpO2 96%   BMI 37.89 kg/m     Physical Exam Vitals and nursing note reviewed.  Constitutional:      Appearance: Normal appearance.  HENT:     Head: Normocephalic.  Eyes:     Conjunctiva/sclera: Conjunctivae normal.  Cardiovascular:     Rate and Rhythm: Normal rate and regular rhythm.     Pulses: Normal pulses.     Heart sounds: Normal heart sounds.  Pulmonary:     Effort: Pulmonary effort is normal.     Breath sounds: Normal breath sounds.  Musculoskeletal:        General: Swelling and deformity (left middle finger) present.     Cervical back: Normal range of motion.  Skin:    General: Skin is warm.  Neurological:     General: No focal deficit present.     Mental Status: He is alert and oriented to person, place, and time.  Psychiatric:        Mood and Affect: Mood normal.        Behavior: Behavior normal.        Thought Content: Thought content normal.        Judgment: Judgment normal.      Assessment & Plan:   Problem List Items Addressed  This Visit       Musculoskeletal and Integument   Chronic gout of hand - Primary    Chronic, ongoing.  Uric acid levels were elevated at 8.2.  He states the colchicine did help with the pain.  There is still some swelling to his left middle finger.  Will have him start allopurinol 100 mg daily.  Discussed limiting foods that are high in purine, attached information on a low purine diet.  Follow-up at next scheduled appointment with PCP.      Relevant Medications   allopurinol (ZYLOPRIM) 100 MG tablet    Return in about 6 weeks (around 08/19/2022) for 6-8 weeks with Dr. Georg Ruddle, gout.    Gerre Scull, NP

## 2022-07-08 NOTE — Patient Instructions (Signed)
It was great to see you!  Start allopurinol 1 tablet daily to help prevent gout attacks and to keep your uric acid levels down.  I have attached some information on gout and low purine diet.   Let's follow-up in 6-8 weeks, sooner if you have concerns.  If a referral was placed today, you will be contacted for an appointment. Please note that routine referrals can sometimes take up to 3-4 weeks to process. Please call our office if you haven't heard anything after this time frame.  Take care,  Rodman Pickle, NP

## 2022-07-12 ENCOUNTER — Telehealth: Payer: Self-pay | Admitting: Cardiology

## 2022-07-12 DIAGNOSIS — M7989 Other specified soft tissue disorders: Secondary | ICD-10-CM

## 2022-07-12 NOTE — Telephone Encounter (Signed)
Pt c/o medication issue:  1. Name of Medication:   allopurinol (ZYLOPRIM) 100 MG tablet    2. How are you currently taking this medication (dosage and times per day)?   Take 1 tablet (100 mg total) by mouth daily.    3. Are you having a reaction (difficulty breathing--STAT)? No  4. What is your medication issue? Pt was prescribed above mediation by PCP and he would like to make sure that medication will not interfere with his heart medications.

## 2022-07-12 NOTE — Telephone Encounter (Signed)
Called patient with Pharmacist recommendations. Patient stated if he needs to take colchicine what should he do. Informed patient that he would need to contact Dr. Carollee Massed office and let them know he would need a dose reduction if they need him to take colchicine. Patient stated he will call Dr. Carollee Massed office tomorrow. Will send Dr. Janee Morn message, so he is aware.

## 2022-07-12 NOTE — Telephone Encounter (Signed)
No interaction with medications. Ok to take. There is interaction with carvedilol and colchicine so if he needs to take colchicine again, a dose reduction will be needed.

## 2022-07-13 NOTE — Telephone Encounter (Signed)
Pt has called with the in from previous message. Please call him with the FU

## 2022-07-14 NOTE — Telephone Encounter (Signed)
Patient is aware of annotation below and verbalized understanding.  

## 2022-07-19 ENCOUNTER — Other Ambulatory Visit: Payer: Self-pay | Admitting: Cardiology

## 2022-07-20 NOTE — Telephone Encounter (Addendum)
Pt called back in regarding rheumatologist referral. He stated that the office we referred to doesn't have an opening until October. He inquired what he would do about getting his gout medication refills until then. Please advise

## 2022-07-21 NOTE — Telephone Encounter (Signed)
Pt stated that the colchicine and allopurinol has helped with one tablet daily, but he will run out of the colchicine before his follow up appt on 7/25 with you. He asked if he could have a refill before that appointment?

## 2022-07-22 NOTE — Telephone Encounter (Signed)
Left patient a detailed voice message regarding annotation below and to return call to office to schedule a sooner visit than 7/25 to discuss options.

## 2022-07-27 NOTE — Progress Notes (Signed)
Remote ICD transmission.   

## 2022-08-03 ENCOUNTER — Other Ambulatory Visit: Payer: Self-pay | Admitting: Cardiology

## 2022-08-12 ENCOUNTER — Encounter: Payer: Self-pay | Admitting: Family Medicine

## 2022-08-12 ENCOUNTER — Ambulatory Visit (INDEPENDENT_AMBULATORY_CARE_PROVIDER_SITE_OTHER): Payer: 59 | Admitting: Family Medicine

## 2022-08-12 VITALS — BP 110/60 | HR 64 | Temp 96.4°F | Ht 68.0 in | Wt 255.2 lb

## 2022-08-12 DIAGNOSIS — M19111 Post-traumatic osteoarthritis, right shoulder: Secondary | ICD-10-CM

## 2022-08-12 DIAGNOSIS — I480 Paroxysmal atrial fibrillation: Secondary | ICD-10-CM

## 2022-08-12 DIAGNOSIS — M153 Secondary multiple arthritis: Secondary | ICD-10-CM

## 2022-08-12 DIAGNOSIS — M19171 Post-traumatic osteoarthritis, right ankle and foot: Secondary | ICD-10-CM

## 2022-08-12 DIAGNOSIS — M1A0420 Idiopathic chronic gout, left hand, without tophus (tophi): Secondary | ICD-10-CM

## 2022-08-12 MED ORDER — COLCHICINE 0.6 MG PO CAPS
ORAL_CAPSULE | ORAL | 0 refills | Status: DC
Start: 2022-08-12 — End: 2022-11-05

## 2022-08-12 MED ORDER — ACETAMINOPHEN EXTRA STRENGTH 500 MG PO TABS
ORAL_TABLET | ORAL | 0 refills | Status: DC
Start: 2022-08-12 — End: 2023-03-10

## 2022-08-12 MED ORDER — GABAPENTIN 300 MG PO CAPS
600.0000 mg | ORAL_CAPSULE | Freq: Every day | ORAL | 0 refills | Status: DC
Start: 2022-08-12 — End: 2023-07-25

## 2022-08-12 NOTE — Progress Notes (Signed)
Assessment/Plan:   Problem List Items Addressed This Visit       Cardiovascular and Mediastinum   PAF (paroxysmal atrial fibrillation) (HCC)    Continue management with carvedilol and Xarelto. Avoid NSAIDs to reduce bleeding risk. Regular follow-ups to manage medications and monitor interactions.        Musculoskeletal and Integument   Post-traumatic osteoarthritis of multiple joints    Continue gabapentin 300 mg nightly for pain management. Continue Tylenol 500 mg, 1 to 2 tablets, three times daily as needed for pain. Discuss potential referral to pain management if symptoms persist.      Relevant Medications   Acetaminophen Extra Strength 500 MG TABS   Colchicine 0.6 MG CAPS   Chronic gout of hand - Primary   Relevant Medications   Acetaminophen Extra Strength 500 MG TABS   Colchicine 0.6 MG CAPS   Other Relevant Orders   Uric acid   Other Visit Diagnoses     Post-traumatic osteoarthritis of right shoulder       Relevant Medications   Acetaminophen Extra Strength 500 MG TABS   gabapentin (NEURONTIN) 300 MG capsule   Colchicine 0.6 MG CAPS       Medications Discontinued During This Encounter  Medication Reason   gabapentin (NEURONTIN) 300 MG capsule Reorder   Acetaminophen Extra Strength 500 MG TABS Reorder   Colchicine 0.6 MG CAPS     Return in about 3 months (around 11/12/2022) for gout.    Subjective:   Encounter date: 08/12/2022  BRIDGER PIZZI is a 67 y.o. male who has Cardiomyopathy [facility]); CAD (coronary artery disease); Chronic anticoagulation; NSVT (nonsustained ventricular tachycardia) (HCC); Hyperlipidemia; HTN (hypertension); Chronic systolic heart failure (HCC); PAF (paroxysmal atrial fibrillation) (HCC); COPD (chronic obstructive pulmonary disease) (HCC); Ventricular tachycardia (HCC); Post-traumatic osteoarthritis of multiple joints; Toe pain, left; Carpal tunnel syndrome of left wrist; BMI 39.0-39.9,adult; Finger swelling; and Chronic gout  of hand on their problem list..   He  has a past medical history of ACS (acute coronary syndrome) (HCC), Arthritis, Atrial fibrillation (HCC), BRBPR (bright red blood per rectum) (10/22/2017), CAD (coronary artery disease), Chronic anticoagulation, Chronic systolic CHF (congestive heart failure) (HCC), COPD (chronic obstructive pulmonary disease) (HCC) (10/22/2017), Dysrhythmia, HTN (hypertension), Hyperlipidemia, Melena, NSTEMI (non-ST elevated myocardial infarction) (HCC) (01/05/2021), NSVT (nonsustained ventricular tachycardia) (HCC), Obesity, Polyp of cecum, Polyp of descending colon, Polyp of rectum, Polyp of transverse colon, and Shortness of breath.[name]   He presents with chief complaint of Gout (8 week follow-up) .  History of Present Illness:  Gout. Nazire has been experiencing significant pain in his left middle finger attributed to gout. He recalls a severe flare-up after consuming a seafood meal. He last experienced a flare-up post a seafood dinner and has been managing symptoms since his last visit. Currently, Caldwell is on colchicine at a lower dose (due to interaction with carvedilol) to manage flare-ups and allopurinol to prevent future episodes. He is also on anticoagulation, Xarelto, which restricts the use of NSAIDs for pain management due to increased bleeding risk.  Review of Systems  Constitutional:  Negative for chills, diaphoresis, fever, malaise/fatigue and weight loss.  HENT:  Negative for congestion, ear discharge, ear pain and hearing loss.   Eyes:  Negative for blurred vision, double vision, photophobia, pain, discharge and redness.  Respiratory:  Negative for cough, sputum production, shortness of breath and wheezing.   Cardiovascular:  Negative for chest pain and palpitations.  Gastrointestinal:  Negative for abdominal pain, blood in stool, constipation, diarrhea, heartburn,  melena, nausea and vomiting.  Genitourinary:  Negative for dysuria, flank pain, frequency,  hematuria and urgency.  Musculoskeletal:  Positive for joint pain (left middle finger). Negative for myalgias.  Skin:  Negative for itching and rash.  Neurological:  Negative for dizziness, tingling, tremors, speech change, seizures, loss of consciousness, weakness and headaches.  Psychiatric/Behavioral:  Negative for depression, hallucinations, memory loss, substance abuse and suicidal ideas. The patient does not have insomnia.   All other systems reviewed and are negative.   Past Surgical History:  Procedure Laterality Date   CARDIOVERSION N/A 04/27/2013   Procedure: BEDSIDE CARDIOVERSION;  Surgeon: Thurmon Fair, MD;  Location: [facility] OR;  Service: Cardiovascular;  Laterality: N/A;   CARDIOVERSION N/A 05/15/2013   Procedure: CARDIOVERSION/BEDSIDE;  Surgeon: Laurey Morale, MD;  Location[facility] OR;  Service: Cardiovascular;  Laterality: N/A;   CARDIOVERSION N/A 05/16/2014   Procedure: CARDIOVERSION;  Surgeon: Jake Bathe, MD;  Location: [facility] ENDOSCOPY;  Service: Cardiovascular;  Laterality: N/A;   CARDIOVERSION N/A 06/07/2014   Procedure: CARDIOVERSION;  Surgeon: Jake Bathe, MD;  Location: [facility] ENDOSCOPY;  Service: Cardiovascular;  Laterality: N/A;   COLONOSCOPY WITH PROPOFOL N/A 10/27/2017   Procedure: COLONOSCOPY WITH PROPOFOL;  Surgeon: Napoleon Form, MD;  Location: [facility] ENDOSCOPY;  Service: Endoscopy;  Laterality: N/A;   CORONARY ANGIOPLASTY WITH STENT PLACEMENT  2010   BMS 2.24mm 23 to LAD. CTO of RCA unsuccessful, good left to right collaterals   ESOPHAGOGASTRODUODENOSCOPY (EGD) WITH PROPOFOL N/A 10/24/2017   Procedure: ESOPHAGOGASTRODUODENOSCOPY (EGD) WITH PROPOFOL;  Surgeon: Napoleon Form, MD;  Location: [facility] ENDOSCOPY;  Service: Endoscopy;  Laterality: N/A;   FOOT SURGERY Right    ICD IMPLANT N/A 01/06/2021   Procedure: ICD IMPLANT;  Surgeon: Regan Lemming, MD;  Location: [facility] INVASIVE CV LAB;  Service: Cardiovascular;  Laterality: N/A;   LEFT HEART CATH AND CORONARY  ANGIOGRAPHY N/A 01/05/2021   Procedure: LEFT HEART CATH AND CORONARY ANGIOGRAPHY;  Surgeon: Runell Gess, MD;  Location: [facility] INVASIVE CV LAB;  Service: Cardiovascular;  Laterality: N/A;   POLYPECTOMY  10/27/2017   Procedure: POLYPECTOMY;  Surgeon: Napoleon Form, MD;  Location: [facility] ENDOSCOPY;  Service: Endoscopy;;    Outpatient Medications Prior to Visit  Medication Sig Dispense Refill   allopurinol (ZYLOPRIM) 100 MG tablet Take 1 tablet (100 mg total) by mouth daily. 30 tablet 2   atorvastatin (LIPITOR) 40 MG tablet Take 1 tablet (40 mg total) by mouth daily. 100 tablet 0   carvedilol (COREG) 25 MG tablet TAKE ONE TABLET BY MOUTH TWICE A DAY 180 tablet 3   Coenzyme Q10 (CO Q-10) 200 MG CAPS Take 200 mg by mouth daily.     digoxin (LANOXIN) 0.125 MG tablet TAKE ONE TABLET BY MOUTH DAILY 90 tablet 3   fexofenadine (ALLEGRA) 180 MG tablet Take 180 mg by mouth daily.     furosemide (LASIX) 40 MG tablet Take 1 tablet (40 mg total) by mouth daily. 90 tablet 0   LevOCARNitine (CARNITINE PO) Take 500 mg by mouth daily.     Multiple Vitamin (MULTIVITAMIN) tablet Take 1 tablet by mouth daily.     nitroGLYCERIN (NITROSTAT) 0.4 MG SL tablet Place 1 tablet (0.4 mg total) under the tongue every 5 (five) minutes as needed for chest pain. 25 tablet 4   NON FORMULARY Take 465 mg by mouth as needed (joint pain). HiActives Tart Cherry     Omega-3 Fatty Acids (OMEGA 3 PO) Take 1 capsule by mouth daily.  OVER THE COUNTER MEDICATION Take 1 capsule by mouth daily at 6 (six) AM. INSTAFLEX SUPPLEMENT for joint pain.     rivaroxaban (XARELTO) 20 MG TABS tablet TAKE ONE TABLET BY MOUTH DAILY WITH SUPPER 90 tablet 1   sacubitril-valsartan (ENTRESTO) 24-26 MG Take 1 tablet by mouth 2 (two) times daily. 180 tablet 0   vitamin C (ASCORBIC ACID) 500 MG tablet Take 500 mg by mouth daily.     VITAMIN D, CHOLECALCIFEROL, PO Take 5,000 Units by mouth daily.     Acetaminophen Extra Strength 500 MG TABS TAKE 1-2  TABLETS BY MOUTH THREE (THREE) TIMES DAILY AS NEEDED. 180 tablet 0   ANORO ELLIPTA 62.5-25 MCG/INH AEPB INHALE ONE PUFFS INTO THE LUNGS DAILY (Patient not taking: Reported on 08/12/2022) 60 each 3   Colchicine 0.6 MG CAPS Day 1: Take 2 caps, then 1 cap an hour later. If still having swelling, may take 1 cap daily for up to 7 days (Patient not taking: Reported on 08/12/2022) 30 capsule 0   gabapentin (NEURONTIN) 300 MG capsule Take 2 capsules (600 mg total) by mouth at bedtime. 60 capsule 2   No facility-administered medications prior to visit.    Family History  Problem Relation Age of Onset   Other Father        Car accident   Coronary artery disease Neg Hx     Social History   Socioeconomic History   Marital status: Single    Spouse name: Not on file   Number of children: Not on file   Years of education: Not on file   Highest education level: Not on file  Occupational History   Not on file  Tobacco Use   Smoking status: Former    Types: Cigars   Smokeless tobacco: Never   Tobacco comments:    1 cigar per month states he stopped smoking  Vaping Use   Vaping status: Never Used  Substance and Sexual Activity   Alcohol use: Yes    Alcohol/week: 3.0 standard drinks of alcohol    Types: 3 Glasses of wine per week   Drug use: No    Comment: 1 per month...pt states he stopped smoking   Sexual activity: Not Currently  Other Topics Concern   Not on file  Social History Narrative   Not on file   Social Determinants of Health   Financial Resource Strain: Low Risk  (03/22/2022)   Received from [facility]   Overall Financial Resource Strain (CARDIA)    Difficulty of Paying Living Expenses: Not hard at all  Food Insecurity: No Food Insecurity (03/22/2022)   Received from [facility]   Hunger Vital Sign    Worried About Running Out of Food in the Last Year: Never true    Ran Out of Food in the Last Year: Never true  Transportation Needs: No Transportation Needs (03/22/2022)    Received from [facility] - Transportation    Lack of Transportation (Medical): No    Lack of Transportation (Non-Medical): No  Physical Activity: Inactive (09/25/2020)   Received from [facility]   Exercise Vital Sign    Days of Exercise per Week: 0 days    Minutes of Exercise per Session: 0 min  Stress: No Stress Concern Present (09/25/2020)   Received from [facility] of Occupational Health - Occupational Stress Questionnaire    Feeling of Stress : Not at all  Social Connections: Unknown (05/30/2021)   Received from  [facility]   Social Network    Social Network: Not on file  Intimate Partner Violence: Unknown (04/30/2021)   Received from [facility]   HITS    Physically Hurt: Not on file    Insult or Talk Down To: Not on file    Threaten Physical Harm: Not on file    Scream or Curse: Not on file                                                                                                  Objective:  Physical Exam: BP 110/60 (BP Location: Right Arm, Patient Position: Sitting)   Pulse 64   Temp (!) 96.4 F (35.8 C) (Temporal)   Ht 5\' 8"  (1.727 m)   Wt 255 lb 3.2 oz (115.8 kg)   SpO2 96%   BMI 38.80 kg/m     Physical Exam  CUP PACEART REMOTE DEVICE CHECK  Result Date: 07/06/2022 Scheduled remote reviewed. Normal device function.  3 NSVT, no therapy Next remote 91 days. LA, CVRS   Recent Results (from the past 2160 hour(s))  Uric acid     Status: Abnormal   Collection Time: 06/10/22  3:16 PM  Result Value Ref Range   Uric Acid, Serum 8.2 (H) 4.0 - 7.8 mg/dL  Sedimentation rate     Status: None   Collection Time: 06/10/22  3:16 PM  Result Value Ref Range   Sed Rate 16 0 - 20 mm/hr  C-reactive protein     Status: None   Collection Time: 06/10/22  3:16 PM  Result Value Ref Range   CRP <1.0 0.5 - 20.0 mg/dL  Arthritis Panel     Status: None   Collection Time: 06/10/22  3:16 PM  Result Value Ref Range   Uric Acid 8.2 3.8  - 8.4 mg/dL    Comment:            Therapeutic target for gout patients: <6.0   Anti Nuclear Antibody (ANA) Negative Negative   Rheumatoid fact SerPl-aCnc <10.0 <14.0 IU/mL   Sed Rate 15 0 - 30 mm/hr  Rheumatoid Arthritis Profile     Status: None   Collection Time: 06/10/22  3:16 PM  Result Value Ref Range   Cyclic Citrullin Peptide Ab 7 0 - 19 units    Comment:                           Negative               <20                           Weak positive      20 - 39                           Moderate positive  40 - 59  Strong positive        >59   HLA-B27 antigen     Status: None   Collection Time: 06/10/22  3:16 PM  Result Value Ref Range   HLA-B27 Antigen NEGATIVE NEGATIVE  Comprehensive metabolic panel     Status: Abnormal   Collection Time: 06/25/22  4:01 PM  Result Value Ref Range   Glucose 105 (H) 70 - 99 mg/dL   BUN 11 8 - 27 mg/dL   Creatinine, Ser 0.10 0.76 - 1.27 mg/dL   eGFR 87 >27 OZ/DGU/4.40   BUN/Creatinine Ratio 12 10 - 24   Sodium 139 134 - 144 mmol/L   Potassium 4.8 3.5 - 5.2 mmol/L   Chloride 100 96 - 106 mmol/L   CO2 26 20 - 29 mmol/L   Calcium 9.5 8.6 - 10.2 mg/dL   Total Protein 7.1 6.0 - 8.5 g/dL   Albumin 4.3 3.9 - 4.9 g/dL   Globulin, Total 2.8 1.5 - 4.5 g/dL   Albumin/Globulin Ratio 1.5 1.2 - 2.2   Bilirubin Total 1.4 (H) 0.0 - 1.2 mg/dL   Alkaline Phosphatase 65 44 - 121 IU/L   AST 25 0 - 40 IU/L   ALT 32 0 - 44 IU/L  CBC     Status: None   Collection Time: 06/25/22  4:01 PM  Result Value Ref Range   WBC 7.5 3.4 - 10.8 x10E3/uL   RBC 4.82 4.14 - 5.80 x10E6/uL   Hemoglobin 15.1 13.0 - 17.7 g/dL   Hematocrit 34.7 42.5 - 51.0 %   MCV 93 79 - 97 fL   MCH 31.3 26.6 - 33.0 pg   MCHC 33.7 31.5 - 35.7 g/dL   RDW 95.6 38.7 - 56.4 %   Platelets 182 150 - 450 x10E3/uL  Digoxin level     Status: Abnormal   Collection Time: 06/25/22  4:01 PM  Result Value Ref Range   Digoxin, Serum 1.0 (H) 0.5 - 0.9 ng/mL    Comment:  Concentrations above 2.0 ng/mL are generally considered toxic. Some overlap of toxic and non-toxic values have been reported.                             Detection Limit = 0.4 ng/mL Therapeutic range is derived from 2013 ACCF/AHA Guidelines for the Management of Heart Failure.   Lipid panel     Status: None   Collection Time: 06/25/22  4:01 PM  Result Value Ref Range   Cholesterol, Total 145 100 - 199 mg/dL   Triglycerides 332 0 - 149 mg/dL   HDL 44 >95 mg/dL   VLDL Cholesterol Cal 24 5 - 40 mg/dL   LDL Chol Calc (NIH) 77 0 - 99 mg/dL   Chol/HDL Ratio 3.3 0.0 - 5.0 ratio    Comment:                                   T. Chol/HDL Ratio                                             Men  Women  1/2 Avg.Risk  3.4    3.3                                   Avg.Risk  5.0    4.4                                2X Avg.Risk  9.6    7.1                                3X Avg.Risk 23.4   11.0   CUP PACEART REMOTE DEVICE CHECK     Status: None   Collection Time: 07/05/22  2:06 AM  Result Value Ref Range   Date Time Interrogation Session [id number]    Pulse Generator Manufacturer [facility]    Pulse Gen Model [id number] [place] VR    Pulse Gen Serial Number [id number]    Clinic Name [facility]    Implantable Pulse Generator Type Implantable Cardiac Defibulator    Implantable Pulse Generator Implant Date [id number]    Implantable Lead Manufacturer [facility]    Implantable Lead Model [id number] Durata SJ4    Implantable Lead Serial Number [id number]    Implantable Lead Implant Date [id number]    Implantable Lead Location Detail 1 APEX    Implantable Lead Location [id number]    Implantable Lead Connection Status [id number]    Lead Channel Setting Sensing Sensitivity 0.5 mV   Lead Channel Setting Sensing Adaptation Mode Adaptive Sensing    Lead Channel Setting Pacing Pulse Width 0.5 ms   Lead Channel Setting Pacing Amplitude 2.5 V   Zone Setting Status Active    Zone Setting Status Active     Zone Setting Status Active    Lead Channel Status NULL    Lead Channel Impedance Value 390 ohm   Lead Channel Sensing Intrinsic Amplitude 10.7 mV   Lead Channel Pacing Threshold Amplitude 0.75 V   Lead Channel Pacing Threshold Pulse Width 0.5 ms   HighPow Impedance 68 ohm   HighPow Imped Status NULL    Battery Status MOS    Battery Remaining Longevity 107 mo   Battery Remaining Percentage 87.0 %   Battery Voltage 3.01 V   Brady Statistic RV Percent Paced 2.4 %        Garner Nash, MD, MS

## 2022-08-12 NOTE — Assessment & Plan Note (Addendum)
Continue allopurinol 100 mg daily to prevent gout flare-ups. Prescribe colchicine 0.6 mg as needed for flare-ups, with new instructions: start with one tablet at the onset of a flare-up, take a second tablet on the next day if symptoms persist. Check uric acid levels to ensure they are dropping towards the target of under 6 mg/dL. Discussed dietary modifications to prevent flare-ups, particularly avoiding shellfish and alcohol

## 2022-08-12 NOTE — Assessment & Plan Note (Signed)
Continue management with carvedilol and Xarelto. Avoid NSAIDs to reduce bleeding risk. Regular follow-ups to manage medications and monitor interactions.

## 2022-08-12 NOTE — Assessment & Plan Note (Signed)
Continue gabapentin 300 mg nightly for pain management. Continue Tylenol 500 mg, 1 to 2 tablets, three times daily as needed for pain. Discuss potential referral to pain management if symptoms persist.

## 2022-08-12 NOTE — Patient Instructions (Signed)
VISIT SUMMARY:  Dear [name], thank you for coming in for your follow-up consultation. We discussed your ongoing management of gout, chronic pain, and atrial fibrillation. You mentioned a recent gout flare-up and an interaction between your gout and heart medications. We also discussed your chronic pain management and your ongoing treatment for atrial fibrillation.  YOUR PLAN:  -GOUT: Gout is a type of arthritis that causes sudden, severe attacks of pain, swelling, redness and tenderness, often in the joint at the base of the big toe. We will continue your current treatment with Allopurinol and Colchicine. However, due to an interaction with your heart medication, we will use a lower dose of Colchicine to manage flare-ups. We will also check your uric acid levels today to assess the effectiveness of your Allopurinol therapy. I am referring you to a rheumatologist for further management of your gout and arthritis.  -CHRONIC PAIN: Chronic pain is a persistent pain that lasts for weeks, months, or even years. It can occur anywhere in your body and can feel different in different affected areas. We will continue your current treatment with Gabapentin at night and Tylenol up to three times daily as needed.  -ATRIAL FIBRILLATION: Atrial fibrillation is an irregular and often rapid heart rate that can increase your risk of strokes, heart failure and other heart-related complications. We will continue your current treatment with Carvedilol and Xarelto as prescribed.  INSTRUCTIONS:  Please continue taking your medications as prescribed. If you experience any side effects or if your symptoms worsen, please contact our office immediately. We will also be checking your uric acid levels today to assess the effectiveness of your gout treatment. I have referred you to a rheumatologist for further management of your gout and arthritis. Please make sure to schedule and attend this appointment.

## 2022-08-13 LAB — URIC ACID: Uric Acid, Serum: 6.5 mg/dL (ref 4.0–7.8)

## 2022-08-16 ENCOUNTER — Telehealth: Payer: Self-pay | Admitting: Family Medicine

## 2022-08-16 NOTE — Telephone Encounter (Signed)
I advised pt per PCP OV on 08/12/2022 " Continue allopurinol 100 mg daily to prevent gout flare-ups. Prescribe colchicine 0.6 mg as needed for flare-ups, with new instructions: start with one tablet at the onset of a flare-up, take a second tablet on the next day if symptoms persist.  "Also reviewed uric acid lab results "Uric acid significantly improved.  Recommend maintaining current dose of allopurinol 100 mg daily until can follow-up with rheumatology again in the fall."   Pt understood.

## 2022-08-16 NOTE — Telephone Encounter (Signed)
Pt said you was suppose to lower the dosage from 6.0 but when he pick the medication up it still said 0.6. (  Colchicine  ) please call the pt

## 2022-08-19 ENCOUNTER — Ambulatory Visit: Payer: 59 | Admitting: Family Medicine

## 2022-08-23 ENCOUNTER — Telehealth: Payer: Self-pay | Admitting: Family Medicine

## 2022-08-23 NOTE — Telephone Encounter (Signed)
Pt is wanting his referral from 06/16/22 for Rheumatology from Dr Janee Morn to be sent over to  [facility]  Address: [address], [place], [place] [address] Phone: [phone]

## 2022-08-27 NOTE — Telephone Encounter (Signed)
I spoke with Norman Rodriguez, he is going to call and see if he can get scheduled with [name].

## 2022-09-06 ENCOUNTER — Other Ambulatory Visit: Payer: Self-pay | Admitting: Cardiology

## 2022-09-06 DIAGNOSIS — I5022 Chronic systolic (congestive) heart failure: Secondary | ICD-10-CM

## 2022-09-07 MED ORDER — SACUBITRIL-VALSARTAN 24-26 MG PO TABS
1.0000 | ORAL_TABLET | Freq: Two times a day (BID) | ORAL | 2 refills | Status: DC
Start: 2022-09-07 — End: 2023-07-01

## 2022-09-09 ENCOUNTER — Telehealth: Payer: Self-pay | Admitting: Family Medicine

## 2022-09-09 NOTE — Telephone Encounter (Signed)
09/09/22 - Pt called saying to let Dr Janee Morn know he could not get an earlier appt time with the Rheumatologist he was referred to.

## 2022-09-10 NOTE — Telephone Encounter (Signed)
FYI

## 2022-09-22 ENCOUNTER — Telehealth: Payer: Self-pay

## 2022-09-22 NOTE — Telephone Encounter (Signed)
Device alert for successful ATP Event occurred 8/27 @ 20:05, duration 20sec, HR 179 EGM shows sustained VT, rate falling into the VT-1 zone, rhythm pace terminated with 1 burst of ATP HF diagnostics recently abnormal Route to triage LA, CVRS   Successful ATP. Corvue elevated. Recently declined monthly HF dx monitoring per note on 8/15. Pt notified of driving restrictions. Reports taking his medications as prescribed. Pt reports no symptoms. Says he swells all the time d/t recent gout diagnosis. Reports at the time of the therapy pt was in a cab on his way to/from [employer] on his  way out with friends.

## 2022-09-23 NOTE — Telephone Encounter (Addendum)
Left detailed message (okay per DPR) on VM, letting patient know that our schedulers will be reaching out to him to make an appt with one of Dr. Elberta Fortis PA's to follow up on recent device flagged event.  He was given device clinic number to call back if he has any questions. Patient given driving restrictions on initial call by Raj Janus, RN.

## 2022-09-28 NOTE — Telephone Encounter (Signed)
Pt needs an appointment. Phone number on file is correct. Due to transportation the patient needs an appointment after 2 pm.

## 2022-09-29 NOTE — Telephone Encounter (Signed)
Doesn't appear that patient can get in before October 8th.  Question whether you have a work in spot sooner if needed or if patient needs blood work in the interim since appt is over 1 month away.   Let me know your thoughts.  Thanks.

## 2022-09-30 IMAGING — DX DG CHEST 2V
2 series · 2 of 2 positions shown · non-contrast
Comparison: Previous studies including the examination of
01/05/2021

CLINICAL DATA: Status post pacemaker placement

EXAM:
CHEST - 2 VIEW

[chest ap]
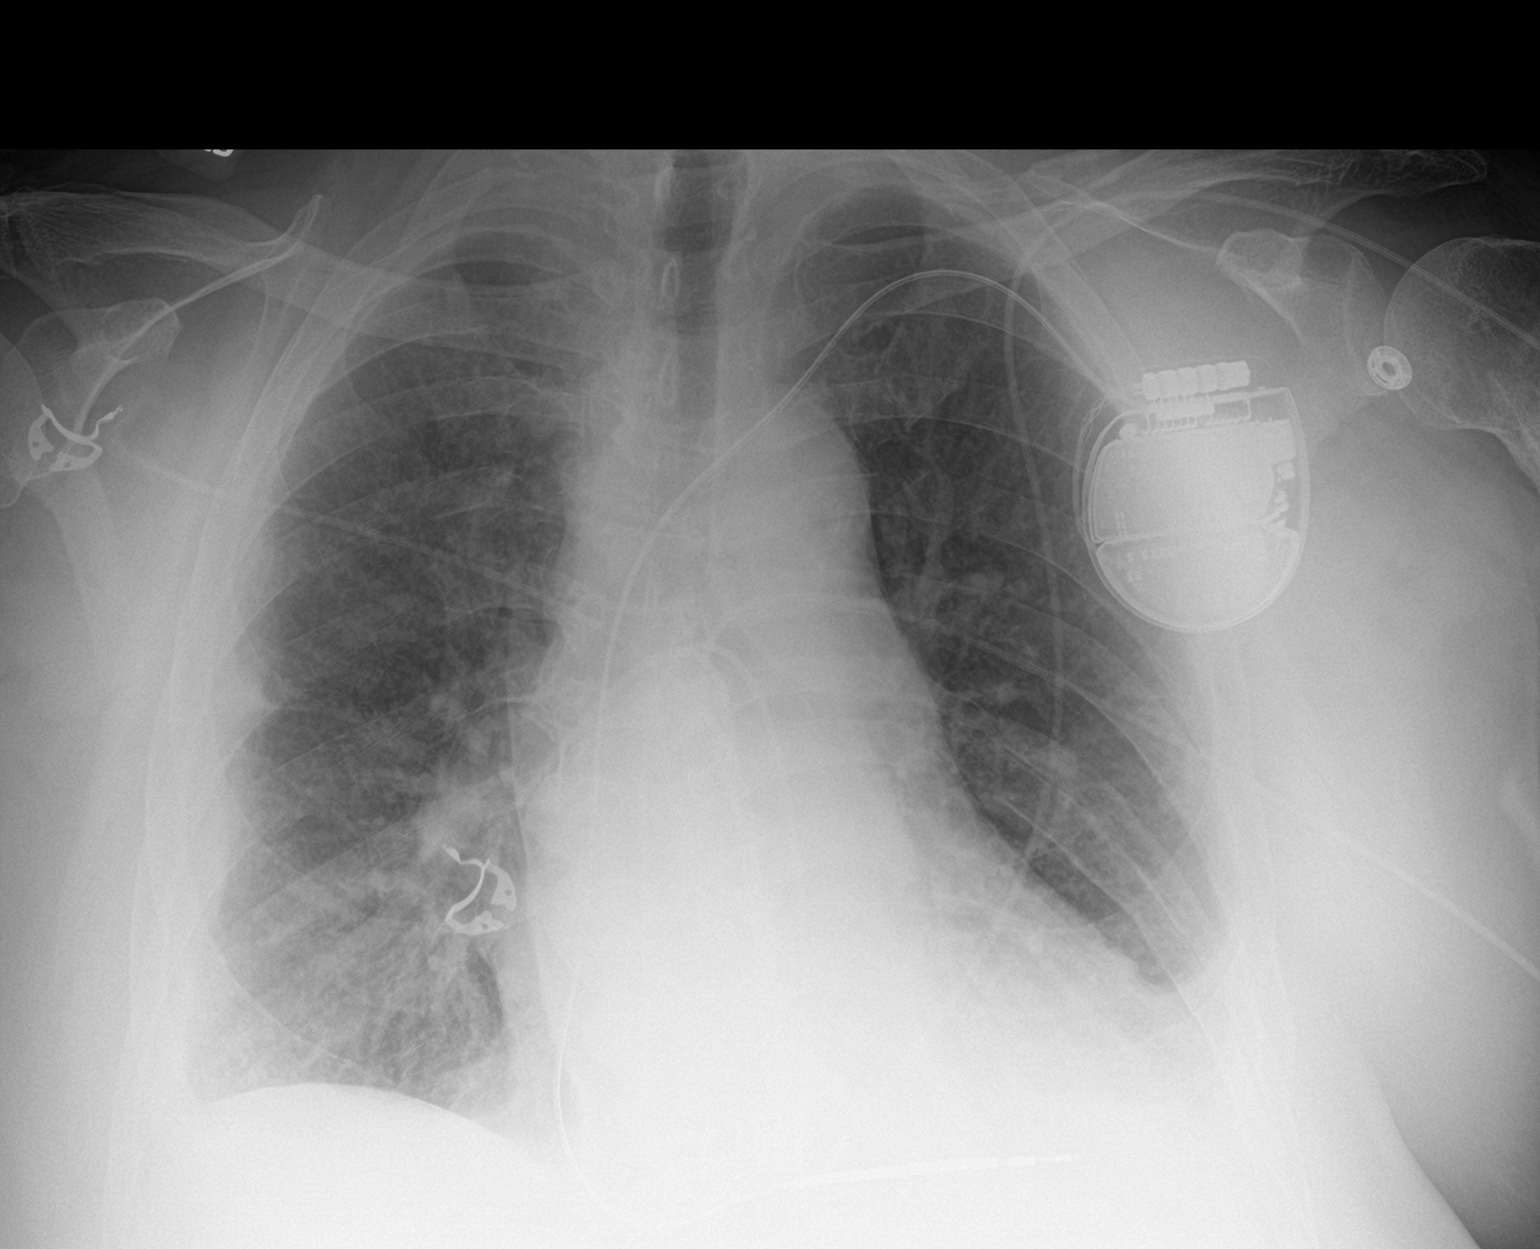

[chest lat]
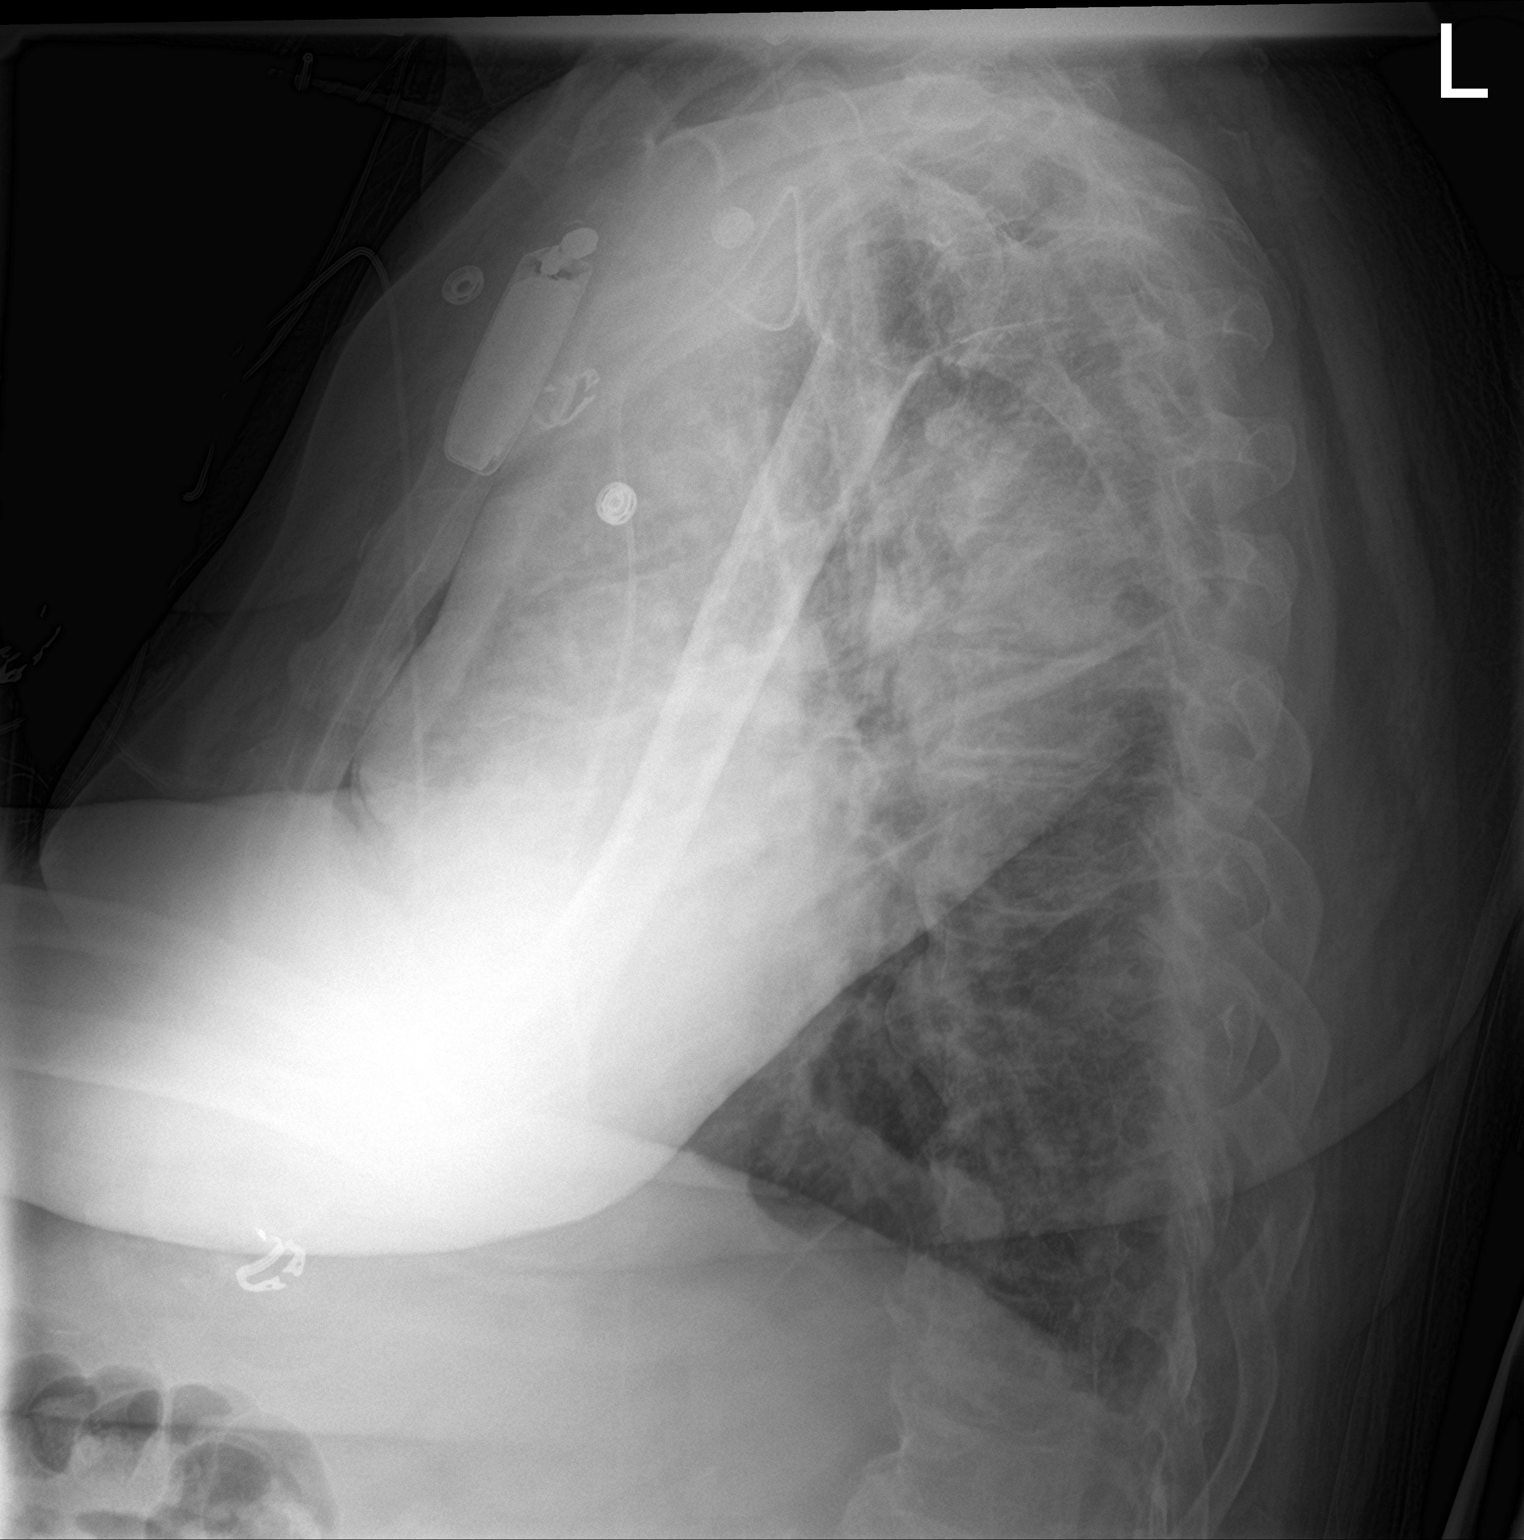

[2 of 2 positions shown; findings below may reference images not displayed]

FINDINGS: Transverse diameter of heart is increased. Thoracic aorta is
ectatic. Central pulmonary vessels are prominent without signs of
alveolar pulmonary edema. There is interval placement of
pacemaker/defibrillator battery in the left infraclavicular region
with tip of lead in right ventricle. There is blunting of both
lateral CP angles. There is no pneumothorax.
IMPRESSION: Central pulmonary vessels are prominent without signs of alveolar
pulmonary edema. Increased density is seen in the lower lung fields,
more so on the left side suggesting bilateral pleural effusions and
possibly underlying infiltrates. There is interval placement of
pacemaker/defibrillator battery in the left infraclavicular region.

## 2022-10-01 ENCOUNTER — Telehealth: Payer: Self-pay | Admitting: Gastroenterology

## 2022-10-01 NOTE — Telephone Encounter (Signed)
Patient called would like to schedule his recall colon.

## 2022-10-04 ENCOUNTER — Ambulatory Visit (INDEPENDENT_AMBULATORY_CARE_PROVIDER_SITE_OTHER): Payer: 59

## 2022-10-04 ENCOUNTER — Other Ambulatory Visit: Payer: Self-pay | Admitting: Nurse Practitioner

## 2022-10-04 DIAGNOSIS — I5022 Chronic systolic (congestive) heart failure: Secondary | ICD-10-CM | POA: Diagnosis not present

## 2022-10-04 DIAGNOSIS — I255 Ischemic cardiomyopathy: Secondary | ICD-10-CM

## 2022-10-04 LAB — CUP PACEART REMOTE DEVICE CHECK
Battery Remaining Longevity: 104 mo
Battery Remaining Percentage: 85 %
Battery Voltage: 3.01 V
Brady Statistic RV Percent Paced: 2.8 %
Date Time Interrogation Session: 20240909025251
HighPow Impedance: 63 Ohm
Implantable Lead Connection Status: 753985
Implantable Lead Implant Date: 20221213
Implantable Lead Location: 753860
Implantable Pulse Generator Implant Date: 20221213
Lead Channel Impedance Value: 360 Ohm
Lead Channel Pacing Threshold Amplitude: 0.75 V
Lead Channel Pacing Threshold Pulse Width: 0.5 ms
Lead Channel Sensing Intrinsic Amplitude: 7.6 mV
Lead Channel Setting Pacing Amplitude: 2.5 V
Lead Channel Setting Pacing Pulse Width: 0.5 ms
Lead Channel Setting Sensing Sensitivity: 0.5 mV
Pulse Gen Serial Number: 810054243

## 2022-10-04 NOTE — Telephone Encounter (Signed)
Patient needs BMP,MAG before appt 9/16 with Dr. Elberta Fortis.   LMTCB to make appt.

## 2022-10-04 NOTE — Telephone Encounter (Signed)
Will have to bring patient in for office visit prior to procedure either with me or APP.  Thank you

## 2022-10-04 NOTE — Telephone Encounter (Signed)
Last seen 2 years ago in October. Medical history of hypertension, CAD s/p mid LAD stent 2010, mixed ischemic/nonischemic cardiomyopathy, systolic CHF, atrial fibrillation on Xarelto, DVT,  COPD, GERD, lower GI bleed 10/2017 and colon polyps.  Do you want him seen before scheduling his hospital procedure or can he be a direct?

## 2022-10-04 NOTE — Telephone Encounter (Signed)
Called patient. Line rings and then goes to busy.  Patient needs an office visit with Dr Lavon Paganini or an APP.

## 2022-10-05 NOTE — Telephone Encounter (Addendum)
Requesting: ALLOPURINOL 100MG  TAB  Last Visit: 08/12/2022 Next Visit: Visit date not found Last Refill: 07/08/2022 by Rodman Pickle, DNP  Please Advise

## 2022-10-06 ENCOUNTER — Ambulatory Visit: Payer: 59 | Admitting: Gastroenterology

## 2022-10-06 NOTE — Telephone Encounter (Signed)
EP scheduler had forgotten to discuss needed lab work before appt and when pt could do. Called pt to follow up on this and he reports he is "really sick right" now w/ "deep chest congestion" and currently waiting on 4 medications to be delivered to help w/ this. States he currently cannot put any weight on his left knee/leg. He does not have transportation either. Will forward to MD for his FYI. Pt aware we will seem him at [facility] appt. He will let us know by end of week if not improved and if he does not think he will make Monday appt.

## 2022-10-06 NOTE — Telephone Encounter (Signed)
LM again for patient.  Needs to come in for labs this week before appt next week.

## 2022-10-08 ENCOUNTER — Telehealth: Payer: Self-pay

## 2022-10-08 NOTE — Telephone Encounter (Signed)
Pt states he will make it to his appointment on Monday at 3 pm.

## 2022-10-11 ENCOUNTER — Ambulatory Visit: Payer: 59 | Attending: Physician Assistant | Admitting: Cardiology

## 2022-10-11 ENCOUNTER — Encounter: Payer: Self-pay | Admitting: Cardiology

## 2022-10-11 VITALS — BP 144/78 | HR 78 | Ht 68.0 in | Wt 251.2 lb

## 2022-10-11 DIAGNOSIS — I472 Ventricular tachycardia, unspecified: Secondary | ICD-10-CM | POA: Diagnosis not present

## 2022-10-11 DIAGNOSIS — I255 Ischemic cardiomyopathy: Secondary | ICD-10-CM

## 2022-10-11 DIAGNOSIS — Z79899 Other long term (current) drug therapy: Secondary | ICD-10-CM

## 2022-10-11 DIAGNOSIS — I4821 Permanent atrial fibrillation: Secondary | ICD-10-CM

## 2022-10-11 DIAGNOSIS — I5022 Chronic systolic (congestive) heart failure: Secondary | ICD-10-CM | POA: Diagnosis not present

## 2022-10-11 MED ORDER — AMIODARONE HCL 200 MG PO TABS: ORAL_TABLET | ORAL | 3 refills | Status: DC

## 2022-10-11 MED ORDER — MEXILETINE HCL 250 MG PO CAPS: 250.0000 mg | ORAL_CAPSULE | Freq: Two times a day (BID) | ORAL | 3 refills | Status: DC

## 2022-10-11 NOTE — Progress Notes (Signed)
Electrophysiology Office Note:   Date:  10/11/2022  ID:  CONTRELL EVARISTO, DOB 1955-10-29, MRN [id number]  Primary Cardiologist: Donato Schultz, MD Electrophysiologist: Regan Lemming, MD      History of Present Illness:   Norman Rodriguez is a 67 y.o. male with h/o atrial fibrillation, coronary artery disease, chronic systolic heart failure, COPD, hypertension seen today for routine electrophysiology followup.   Since last being seen in our clinic the patient reports doing overall well.  On device interrogation, he has had 2 episodes of VT treated with ATP therapy.  His first episode was in August, his second episode was earlier today.  He does note an increase in his level of dizziness today while in the waiting room of the office.  He says he held his breath and immediately felt better.  he denies chest pain, palpitations, dyspnea, PND, orthopnea, nausea, vomiting, dizziness, syncope, edema, weight gain, or early satiety.   Review of systems complete and found to be negative unless listed in HPI.      EP Information / Studies Reviewed:    EKG is ordered today. Personal review as below.  EKG Interpretation Date/Time:  Monday October 11 2022 15:48:12 EDT Ventricular Rate:  78 PR Interval:    QRS Duration:  96 QT Interval:  424 QTC Calculation: 483 R Axis:   38  Text Interpretation: Atrial fibrillation with premature ventricular or aberrantly conducted complexes Low voltage QRS ST & T wave abnormality, consider inferior ischemia Since previous tracing Premature ventricular complexes Confirmed by Surafel Hilleary ([id number]) on 10/11/2022 3:58:25 PM   ICD Interrogation-  reviewed in detail today,  See PACEART report.  Device History: Abbott Single Chamber ICD implanted 01/06/21 for VT History of appropriate therapy: Yes History of AAD therapy: No   Risk Assessment/Calculations:    CHA2DS2-VASc Score = 4   This indicates a 4.8% annual risk of stroke. The patient's score is  based upon: CHF History: 1 HTN History: 1 Diabetes History: 0 Stroke History: 0 Vascular Disease History: 1 Age Score: 1 Gender Score: 0            Physical Exam:   VS:  BP (!) 144/78 (BP Location: Right Arm, Patient Position: Sitting, Cuff Size: Large)   Pulse 78   Ht 5\' 8"  (1.727 m)   Wt 251 lb 3.2 oz (113.9 kg)   SpO2 98%   BMI 38.19 kg/m    Wt Readings from Last 3 Encounters:  10/11/22 251 lb 3.2 oz (113.9 kg)  08/12/22 255 lb 3.2 oz (115.8 kg)  07/08/22 249 lb 3.2 oz (113 kg)     GEN: Well nourished, well developed in no acute distress NECK: No JVD; No carotid bruits CARDIAC: Regular rate and rhythm, no murmurs, rubs, gallops RESPIRATORY:  Clear to auscultation without rales, wheezing or rhonchi  ABDOMEN: Soft, non-tender, non-distended EXTREMITIES:  No edema; No deformity   ASSESSMENT AND PLAN:    Chronic systolic dysfunction s/p Abbott single chamber ICD  euvolemic today Stable on an appropriate medical regimen Normal ICD function See Pace Art report No changes today  2.  Ventricular tachycardia: Had 2 episodes of ATP.  Deshara Rossi check labs today.  He Ieasha Boerema likely need an antiarrhythmic.  He had blue skin on amiodarone.  Junior Kenedy start mexiletine 250 mg twice daily.  Discussed with primary cardiology  3.  Coronary artery disease: No current chest pain  4.  Hypertension: Mildly elevated today but usually well-controlled  5.  Permanent atrial fibrillation: Well rate  controlled.  6.  Secondary to coagula state: Currently on Xarelto for atrial fibrillation  Disposition:   Follow up with EP APP in 3 months   Signed, Shalah Estelle Jorja Loa, MD

## 2022-10-11 NOTE — Patient Instructions (Addendum)
Medication Instructions:  Your physician has recommended you make the following change in your medication:  START Mexiletine 250 mg twice daily   *If you need a refill on your cardiac medications before your next appointment, please call your pharmacy*   Lab Work: Today:  BMET, CBC, Magnesium & BNP If you have labs (blood work) drawn today and your tests are completely normal, you will receive your results only by: MyChart Message (if you have MyChart) OR A paper copy in the mail If you have any lab test that is abnormal or we need to change your treatment, we will call you to review the results.   Testing/Procedures: None ordered   Follow-Up: At [facility], you and your health needs are our priority.  As part of our continuing mission to provide you with exceptional heart care, we have created designated Provider Care Teams.  These Care Teams include your primary Cardiologist (physician) and Advanced Practice Providers (APPs -  Physician Assistants and Nurse Practitioners) who all work together to provide you with the care you need, when you need it.  We recommend signing up for the patient portal called "MyChart".  Sign up information is provided on this After Visit Summary.  MyChart is used to connect with patients for Virtual Visits (Telemedicine).  Patients are able to view lab/test results, encounter notes, upcoming appointments, etc.  Non-urgent messages can be sent to your provider as well.   To learn more about what you can do with MyChart, go to [email or web address].    Your next appointment:   3 month(s)  The format for your next appointment:   In Person  Provider:   You will see one of the following Advanced Practice Providers on your designated Care Team:   Francis Dowse, [place] "Mardelle Matte" [place], [place] Canary Brim, NP    Thank you for choosing [facility]!!   Dory Horn, RN [phone]  Other Instructions Mexiletine Capsules What is this  medication? MEXILETINE (mex [place] e teen) treats a fast or irregular heartbeat (arrhythmia). It works by slowing down overactive electric signals in the heart, which stabilizes your heart rhythm. It belongs to a group of medications called antiarrhythmics. This medicine may be used for other purposes; ask your health care provider or pharmacist if you have questions. COMMON BRAND NAME(S): Mexitil What should I tell my care team before I take this medication? They need to know if you have any of these conditions: Liver disease Other heart problems Previous heart attack An unusual or allergic reaction to mexiletine, other medications, foods, dyes, or preservatives Pregnant or trying to get pregnant Breast-feeding How should I use this medication? Take this medication by mouth with a glass of water. Follow the directions on the prescription label. It is recommended that you take this medication with food or an antacid. Take your doses at regular intervals. Do not take your medication more often than directed. Do not stop taking except on the advice of your care team. Talk to your care team about the use of this medication in children. Special care may be needed. Overdosage: If you think you have taken too much of this medicine contact a poison control center or emergency room at once. NOTE: This medicine is only for you. Do not share this medicine with others. What if I miss a dose? If you miss a dose, take it as soon as you can. If it is almost time for your next dose, take only that dose. Do not take  double or extra doses. What may interact with this medication? Do not take this medication with any of the following: Dofetilide This medication may also interact with the following: Caffeine Cimetidine Medications for depression, anxiety, or psychotic disturbances Medications to control heart rhythm Phenobarbital Phenytoin Rifampin Theophylline This list may not describe all possible  interactions. Give your health care provider a list of all the medicines, herbs, non-prescription drugs, or dietary supplements you use. Also tell them if you smoke, drink alcohol, or use illegal drugs. Some items may interact with your medicine. What should I watch for while using this medication? Your condition will be monitored closely when you first begin therapy. Often, this medication is first started in a hospital or other monitored health care setting. Once you are on maintenance therapy, visit your care team for regular checks on your progress. Because your condition and use of this medication carry some risk, it is a good idea to carry an identification card, necklace or bracelet with details of your condition, medications, and care team. You may get drowsy or dizzy. Do not drive, use machinery, or do anything that needs mental alertness until you know how this medication affects you. Do not stand or sit up quickly, especially if you are an older patient. This reduces the risk of dizzy or fainting spells. Alcohol can make you more dizzy, increase flushing and rapid heartbeats. Avoid alcoholic drinks. This medication may cause serious skin reactions. They can happen weeks to months after starting the medication. Contact your care team right away if you notice fevers or flu-like symptoms with a rash. The rash may be red or purple and then turn into blisters or peeling of the skin. Or, you might notice a red rash with swelling of the face, lips or lymph nodes in your neck or under your arms. What side effects may I notice from receiving this medication? Side effects that you should report to your care team as soon as possible: Allergic reactions--skin rash, itching, hives, swelling of the face, lips, tongue, or throat Heart rhythm changes--fast or irregular heartbeat, dizziness, feeling faint or lightheaded, chest pain, trouble breathing Infection--fever, chills, cough, or sore throat Liver  injury--right upper belly pain, loss of appetite, nausea, light-colored stool, dark yellow or brown urine, yellowing skin or eyes, unusual weakness or fatigue Rash, fever, and swollen lymph nodes Seizures Unusual bruising or bleeding Side effects that usually do not require medical attention (report to your care team if they continue or are bothersome): Anxiety, nervousness Blurry vision Dizziness Headache Heartburn Nausea Tremors or shaking Vomiting This list may not describe all possible side effects. Call your doctor for medical advice about side effects. You may report side effects to FDA at 1-800-FDA-1088. Where should I keep my medication? Keep out of reach of children. Store at room temperature between 15 and 30 degrees C (59 and 86 degrees F). Throw away any unused medication after the expiration date. NOTE: This sheet is a summary. It may not cover all possible information. If you have questions about this medicine, talk to your doctor, pharmacist, or health care provider.  2024 Elsevier/Gold Standard (2020-11-21 00:00:00)

## 2022-10-12 LAB — CBC
Hematocrit: 45.8 % (ref 37.5–51.0)
Hemoglobin: 15 g/dL (ref 13.0–17.7)
MCH: 32.3 pg (ref 26.6–33.0)
MCHC: 32.8 g/dL (ref 31.5–35.7)
MCV: 99 fL — ABNORMAL HIGH (ref 79–97)
Platelets: 199 10*3/uL (ref 150–450)
RBC: 4.65 x10E6/uL (ref 4.14–5.80)
RDW: 14 % (ref 11.6–15.4)
WBC: 6.8 10*3/uL (ref 3.4–10.8)

## 2022-10-12 LAB — BASIC METABOLIC PANEL
BUN/Creatinine Ratio: 9 — ABNORMAL LOW (ref 10–24)
BUN: 8 mg/dL (ref 8–27)
CO2: 24 mmol/L (ref 20–29)
Calcium: 9.4 mg/dL (ref 8.6–10.2)
Chloride: 101 mmol/L (ref 96–106)
Creatinine, Ser: 0.86 mg/dL (ref 0.76–1.27)
Glucose: 109 mg/dL — ABNORMAL HIGH (ref 70–99)
Potassium: 4.4 mmol/L (ref 3.5–5.2)
Sodium: 141 mmol/L (ref 134–144)
eGFR: 94 mL/min/{1.73_m2} (ref >59)

## 2022-10-12 LAB — PRO B NATRIURETIC PEPTIDE: NT-Pro BNP: 2459 pg/mL — ABNORMAL HIGH (ref 0–376)

## 2022-10-12 LAB — MAGNESIUM: Magnesium: 2 mg/dL (ref 1.6–2.3)

## 2022-10-13 ENCOUNTER — Other Ambulatory Visit: Payer: Self-pay

## 2022-10-13 NOTE — Telephone Encounter (Signed)
Letter mailed to the patient

## 2022-10-14 ENCOUNTER — Other Ambulatory Visit (HOSPITAL_BASED_OUTPATIENT_CLINIC_OR_DEPARTMENT_OTHER): Payer: Self-pay | Admitting: *Deleted

## 2022-10-14 DIAGNOSIS — I5022 Chronic systolic (congestive) heart failure: Secondary | ICD-10-CM

## 2022-10-14 MED ORDER — POTASSIUM CHLORIDE CRYS ER 20 MEQ PO TBCR: 20.0000 meq | EXTENDED_RELEASE_TABLET | ORAL | 1 refills | Status: DC

## 2022-10-14 MED ORDER — FUROSEMIDE 40 MG PO TABS: 40.0000 mg | ORAL_TABLET | ORAL | 1 refills | Status: DC

## 2022-10-18 NOTE — Telephone Encounter (Signed)
error 

## 2022-10-20 NOTE — Progress Notes (Signed)
Remote ICD transmission.   

## 2022-10-25 ENCOUNTER — Telehealth: Payer: Self-pay

## 2022-10-25 NOTE — Telephone Encounter (Signed)
Following alert received from [employer] received for one VT arrhythmia detected on 10/23/2022 that failed 6 bursts of ATP but was successfully converted with one ICD shock.    Had recent OV with Dr. Elberta Fortis 10/11/22, Mexilentine 250 mg BID started.   Attempted to contact patient. No answer, LMTCB.

## 2022-10-26 NOTE — Telephone Encounter (Signed)
Attempted to contact patient. LMTCB

## 2022-10-27 NOTE — Telephone Encounter (Signed)
Attempted to contact patient. No answer,LMTCB 

## 2022-10-28 ENCOUNTER — Ambulatory Visit: Payer: 59 | Attending: Internal Medicine | Admitting: Internal Medicine

## 2022-10-28 ENCOUNTER — Encounter: Payer: Self-pay | Admitting: Internal Medicine

## 2022-10-28 VITALS — BP 118/75 | HR 61 | Resp 17 | Ht 67.0 in | Wt 248.0 lb

## 2022-10-28 DIAGNOSIS — M1A0420 Idiopathic chronic gout, left hand, without tophus (tophi): Secondary | ICD-10-CM | POA: Diagnosis not present

## 2022-10-28 DIAGNOSIS — G5602 Carpal tunnel syndrome, left upper limb: Secondary | ICD-10-CM | POA: Diagnosis not present

## 2022-10-28 DIAGNOSIS — I5022 Chronic systolic (congestive) heart failure: Secondary | ICD-10-CM

## 2022-10-28 DIAGNOSIS — M153 Secondary multiple arthritis: Secondary | ICD-10-CM

## 2022-10-28 MED ORDER — ALLOPURINOL 300 MG PO TABS: 150.0000 mg | ORAL_TABLET | Freq: Every day | ORAL | 0 refills | Status: DC

## 2022-10-28 NOTE — Progress Notes (Signed)
Office Visit Note  Patient: Norman Rodriguez             Date of Birth: [birth year]           MRN: [id number]             PCP: Garnette Gunner, MD Referring: Garnette Gunner, MD Visit Date: 10/28/2022   Subjective:  New Patient (Initial Visit) (Gout)   History of Present Illness: Norman Rodriguez is a 67 y.o. male here for evaluation and management of joint pain with osteoarthritis and gout.  Particularly with episodes of acute joint pain with associated stiffness and swelling started around February of this year first at his left third PIP joint started acutely shortly after midnight without any preceding injury he had eaten a large shrimp dinner at [employer].  Subsequently he had a few more episodes of this acute onset joint pain this year. Sometimes preceded by eating shrimp and he was initially treated with colchicine that improved the inflammation within a few days.  He did have a significant amount of diarrhea from the medication.  He saw his PCP Dr. Janee Morn for evaluation of this in May with lab workup for inflammatory arthritis had a uric acid of 8.2 otherwise pretty unremarkable per patient also questions possibility of psoriatic arthritis.  He started on allopurinol 100 mg daily has not experienced additional flareups of finger swelling since July.  He had a repeat uric acid checked at that visit of 6.5.  So far tolerating the medicine fine without additional exacerbations. Besides these flareups of joint inflammation he has chronic joint pain in multiple areas has been attributed to primary and posttraumatic osteoarthritis.  Did previous surgical fixation of right ankle fractures.  Also has a problem with numbness affecting his left hand especially the fourth and fifth fingers.  He describes diminished sensation pretty much all the time and sometimes gets symptoms up to the level of the elbow most often in the morning or sometimes wakes him at night.  He takes gabapentin and  Tylenol which are partially helpful cannot take NSAIDs due to his long-term anticoagulation. He has significant cardiovascular disease with systolic congestive heart failure on furosemide 40 mg daily for this.  He is in permanent atrial fibrillation and has had several episodes of ventricular tachycardia this year.  He did not tolerate amiodarone he was recently started on mexiletine.  BNP was elevated at 2459 last month.  He recently experienced a shock from his ICD on Saturday for Vtach which was the first time for this.   Labs reviewed 09/2022 Pro-BNP 2459  07/2022 Uric acid 6.5  05/2022 Uric acid 8.2 ANA neg RF neg HLA-B27 neg ESR 15 CRP <1  Activities of Daily Living:  Patient reports morning stiffness for 1 hour.   Patient Denies nocturnal pain.  Difficulty dressing/grooming: Denies Difficulty climbing stairs: Reports Difficulty getting out of chair: Denies Difficulty using hands for taps, buttons, cutlery, and/or writing: Denies  Review of Systems  Constitutional:  Positive for fatigue.  HENT:  Positive for mouth dryness. Negative for mouth sores.   Eyes:  Negative for dryness.  Respiratory:  Positive for shortness of breath.   Cardiovascular:  Negative for chest pain and palpitations.  Gastrointestinal:  Negative for blood in stool, constipation and diarrhea.  Endocrine: Negative for increased urination.  Genitourinary:  Negative for involuntary urination.  Musculoskeletal:  Positive for joint pain, gait problem, joint pain and morning stiffness. Negative for joint swelling, myalgias, muscle  weakness, muscle tenderness and myalgias.  Skin:  Positive for sensitivity to sunlight. Negative for color change, rash and hair loss.  Allergic/Immunologic: Negative for susceptible to infections.  Neurological:  Positive for dizziness and headaches.  Hematological:  Negative for swollen glands.  Psychiatric/Behavioral:  Positive for depressed mood. Negative for sleep disturbance.  The patient is not nervous/anxious.     PMFS History:  Patient Active Problem List   Diagnosis Date Noted   Chronic gout of hand 06/16/2022   Finger swelling 06/13/2022   Post-traumatic osteoarthritis of multiple joints 06/01/2021   Toe pain, left 06/01/2021   Carpal tunnel syndrome of left wrist 06/01/2021   BMI 39.0-39.9,adult 06/01/2021   Ventricular tachycardia (HCC)    COPD (chronic obstructive pulmonary disease) (HCC) 10/22/2017   PAF (paroxysmal atrial fibrillation) (HCC) 05/08/2014   Chronic systolic heart failure (HCC) 03/29/2013   Cardiomyopathy (HCC)    CAD (coronary artery disease)    Chronic anticoagulation    NSVT (nonsustained ventricular tachycardia) (HCC)    Hyperlipidemia    HTN (hypertension)     Past Medical History:  Diagnosis Date   ACS (acute coronary syndrome) (HCC)    Arthritis    Atrial fibrillation (HCC)    a. Dx 2010. b. Initiated on Tikosyn 03/2013.   BRBPR (bright red blood per rectum) 10/22/2017   CAD (coronary artery disease)    a. BMS 2.58mm 23 to LAD. CTO of RCA unsuccessful, good left to right collaterals - 2010.   Chronic anticoagulation    Chronic systolic CHF (congestive heart failure) (HCC)    a. Both ischemic and tachy induced. b. Echo 04/2013: EF 30-35%, no RWMA, normal RV, mod dilated LA (before restoration of NSR).   COPD (chronic obstructive pulmonary disease) (HCC) 10/22/2017   Dysrhythmia    hx of atrial fibrilation   HTN (hypertension)    Hyperlipidemia    Melena    NSTEMI (non-ST elevated myocardial infarction) (HCC) 01/05/2021   NSVT (nonsustained ventricular tachycardia) (HCC)    Obesity    Polyp of cecum    Polyp of descending colon    Polyp of rectum    Polyp of transverse colon    Shortness of breath     Family History  Problem Relation Age of Onset   Other Father        Car accident   Coronary artery disease Neg Hx    Past Surgical History:  Procedure Laterality Date   CARDIAC DEFIBRILLATOR PLACEMENT  2024    CARDIOVERSION N/A 04/27/2013   Procedure: BEDSIDE CARDIOVERSION;  Surgeon: Thurmon Fair, MD;  Location: [facility] OR;  Service: Cardiovascular;  Laterality: N/A;   CARDIOVERSION N/A 05/15/2013   Procedure: CARDIOVERSION/BEDSIDE;  Surgeon: Laurey Morale, MD;  Location[facility] OR;  Service: Cardiovascular;  Laterality: N/A;   CARDIOVERSION N/A 05/16/2014   Procedure: CARDIOVERSION;  Surgeon: Jake Bathe, MD;  Location: [facility] ENDOSCOPY;  Service: Cardiovascular;  Laterality: N/A;   CARDIOVERSION N/A 06/07/2014   Procedure: CARDIOVERSION;  Surgeon: Jake Bathe, MD;  Location: [facility] ENDOSCOPY;  Service: Cardiovascular;  Laterality: N/A;   COLONOSCOPY WITH PROPOFOL N/A 10/27/2017   Procedure: COLONOSCOPY WITH PROPOFOL;  Surgeon: Napoleon Form, MD;  Location: [facility] ENDOSCOPY;  Service: Endoscopy;  Laterality: N/A;   CORONARY ANGIOPLASTY WITH STENT PLACEMENT  01/26/2008   BMS 2.73mm 23 to LAD. CTO of RCA unsuccessful, good left to right collaterals   ESOPHAGOGASTRODUODENOSCOPY (EGD) WITH PROPOFOL N/A 10/24/2017   Procedure: ESOPHAGOGASTRODUODENOSCOPY (EGD) WITH PROPOFOL;  Surgeon: Napoleon Form,  MD;  Location: [facility] ENDOSCOPY;  Service: Endoscopy;  Laterality: N/A;   FOOT SURGERY Right    ICD IMPLANT N/A 01/06/2021   Procedure: ICD IMPLANT;  Surgeon: Regan Lemming, MD;  Location: [facility] INVASIVE CV LAB;  Service: Cardiovascular;  Laterality: N/A;   LEFT HEART CATH AND CORONARY ANGIOGRAPHY N/A 01/05/2021   Procedure: LEFT HEART CATH AND CORONARY ANGIOGRAPHY;  Surgeon: Runell Gess, MD;  Location: [facility] INVASIVE CV LAB;  Service: Cardiovascular;  Laterality: N/A;   POLYPECTOMY  10/27/2017   Procedure: POLYPECTOMY;  Surgeon: Napoleon Form, MD;  Location: [facility] ENDOSCOPY;  Service: Endoscopy;;   Social History   Social History Narrative   Not on file   Immunization History  Administered Date(s) Administered   Influenza,inj,Quad PF,6+ Mos 10/23/2017   Influenza,inj,quad, With Preservative  12/13/2016   PFIZER(Purple Top)SARS-COV-2 Vaccination 04/21/2019, 05/12/2019, 11/16/2019   Pneumococcal Polysaccharide-23 06/22/2018     Objective: Vital Signs: BP 118/75 (BP Location: Right Arm, Patient Position: Sitting, Cuff Size: Normal)   Pulse 61   Resp 17   Ht 5\' 7"  (1.702 m)   Wt 248 lb (112.5 kg)   BMI 38.84 kg/m    Physical Exam Constitutional:      Appearance: He is obese.  Eyes:     Conjunctiva/sclera: Conjunctivae normal.  Cardiovascular:     Rate and Rhythm: Normal rate and regular rhythm.  Pulmonary:     Effort: Pulmonary effort is normal.     Breath sounds: Normal breath sounds.  Musculoskeletal:     Comments: 1+ pitting edema both lower legs  Skin:    General: Skin is warm and dry.     Findings: Rash present.     Comments: Numerous torturous superficial veins at both feet and ankles Mild erythema and hyperpigmentation in lower legs extending less than halfway to the knees, lipodermatosclerosis changes  Neurological:     Mental Status: He is alert.  Psychiatric:        Mood and Affect: Mood normal.      Musculoskeletal Exam:  Elbows full ROM no swelling, percussion over cubital tunnel worse on left elbow radiating to hand Wrists full ROM no tenderness or swelling Fingers chronic appearing widening at MCP joints, lateral deviation 3rd PIP there is slight crepitus present, no palpable effusion and minimal tenderness to pressure Knees no palpable effusions, patellofemoral crepitus b/l Right ankle medial surgical scar and decreased range of motion  Investigation: No additional findings.  Imaging: CUP PACEART REMOTE DEVICE CHECK  Result Date: 10/04/2022 Scheduled remote reviewed. Normal device function.  Next remote 91 days. ML, CVRS Scheduled remote reviewed. Normal device function.  Next remote 91 days. ML, CVRS   Recent Labs: Lab Results  Component Value Date   WBC 6.8 10/11/2022   HGB 15.0 10/11/2022   PLT 199 10/11/2022   NA 141 10/11/2022    K 4.4 10/11/2022   CL 101 10/11/2022   CO2 24 10/11/2022   GLUCOSE 109 (H) 10/11/2022   BUN 8 10/11/2022   CREATININE 0.86 10/11/2022   BILITOT 1.4 (H) 06/25/2022   ALKPHOS 65 06/25/2022   AST 25 06/25/2022   ALT 32 06/25/2022   PROT 7.1 06/25/2022   ALBUMIN 4.3 06/25/2022   CALCIUM 9.4 10/11/2022   GFRAA 98 09/25/2018    Speciality Comments: No specialty comments available.  Procedures:  No procedures performed Allergies: Apple juice, Soybean oil, Lisinopril, Other, Bean pod extract, Carrot [daucus carota], Grass extracts [gramineae pollens], Peach flavor, and Tree extract   Assessment /  Plan:     Visit Diagnoses: Chronic gout of left hand, unspecified cause - Plan: allopurinol (ZYLOPRIM) 300 MG tablet  Symptoms sound consistent for gout based on elevated uric acid and provocation after typical events such as eating large shellfish dinners.  I do not see any evidence indicating psoriatic arthritis.  The distribution is unusual as he never had classic podagra although inflammation preferentially at areas of existing joint damage could account for this.  Underlying cause for gout probably related to his heart failure and diuretics as well as the diet.  Discussed target uric acid goal of less than 6 to prevent flares and decrease monosodium urate crystal accumulation will increase allopurinol to 150 mg daily.  Post-traumatic osteoarthritis of multiple joints  Degenerative and posttraumatic arthritis of multiple areas currently on gabapentin and Tylenol.  Unfortunately not a good candidate for additional oral anti-inflammatory medication.  Sounds like injections have been tried with limited benefit could offer this again if needed otherwise could consider pain management if widespread symptoms are uncontrolled.  Chronic systolic heart failure (HCC)  We discussed for a while the lower extremity swelling I think is all related to chronic edema from cardiovascular disease and obesity not  joint inflammation.  Already on diuretics, does not tolerate compression from what he describes, discussed importance of elevation while at rest.  Carpal tunnel syndrome of left wrist  Previous history recorded for left wrist carpal tunnel syndrome does not appear accurate.  The nerve distribution is ulnar neuropathy involving only the fourth and fifth digits.  Sounds like there was suspicion for compression at the wrist either due to arthritis or his wrist watch with previous visit seeing Dr. Roda Shutters.  Also possible for cubital tunnel compression as he describes classic overnight and morning symptoms.  Not his primary complaint, discussed could try use of an elbow brace overnight and nerve mobilization exercises. If symptoms get worse could benefit with nerve conduction study..  Orders: No orders of the defined types were placed in this encounter.  Meds ordered this encounter  Medications   allopurinol (ZYLOPRIM) 300 MG tablet    Sig: Take 0.5 tablets (150 mg total) by mouth daily.    Dispense:  90 tablet    Refill:  0     Follow-Up Instructions: Return in about 3 months (around 01/28/2023) for New pt gout on allopurinol f/u 3mos.   Fuller Plan, MD  Note - This record has been created using [employer].  Chart creation errors have been sought, but may not always  have been located. Such creation errors do not reflect on  the standard of medical care.

## 2022-10-28 NOTE — Patient Instructions (Addendum)
I recommend increasing the allopurinol dose to 150 mg daily, you can take 1.5 tablets of the current pills and I am sending a new prescription for the 300 mg tablets you can take 1/2 daily for these.   Information for patients with Gout Treatment:  Treatment for gout usually involves medications.  What medications you and your doctor choose will be based on your current health and other medications you currently take.  Gout medications can be used to treat acute gout attacks and prevent future attacks as well as reduce your risk of complications from gout such as the development of tophi from urate crystal deposits.  Alternative medicine:   Certain foods have been studied for their potential to lower uric acid levels, including:  Coffee.  Studies have found an association between coffee drinking (regular and decaf) and lower uric acid levels.  The evidence is not enough to encourage non-coffee drinkers to start, but it may give clues to new ways of treating gout in the future.  Vitamin C.  Supplements containing vitamin C may reduce the levels of uric acid in your blood.  However, vitamin as a treatment for gout. Don't assume that if a little vitamin C is good, than lots is better.  Megadoses of vitamin C may increase your bodies uric acid levels.  Cherries.  Cherries have been associated with lower levels of uric acid in studies, but it isn't clear if they have any effect on gout signs and symptoms.  Eating more cherries and other dar-colored fruits, such as blackberries, blueberries, purple grapes and raspberries, may be a safe way to support your gout treatment.    Lifestyle/Diet Recommendations:  Drink 8 to 16 cups ( about 2 to 4 liters) of fluid each day, with at least half being water. Avoid alcohol Eat a moderate amount of protein, preferably from healthy sources, such as low-fat or fat-free dairy, tofu, eggs, and nut butters. Limit you daily intake of meat, fish, and poultry to 4 to 6  ounces. Avoid high fat meats and desserts. Decrease you intake of shellfish, beef, lamb, pork, eggs and cheese. Choose a good source of vitamin C daily such as citrus fruits, strawberries, broccoli,  brussel sprouts, papaya, and cantaloupe.  Choose a good source of vitamin A every other day such as yellow fruits, or dark green/yellow vegetables. Avoid drastic weigh reduction or fasting.  If weigh loss is desired lose it over a period of several months. See "dietary considerations.." chart for specific food recommendations.  Dietary Considerations for people with Gout  Food with negligible purine content (0-15 mg of purine nitrogen per 100 grams food)  May use as desired except on calorie variations  Non fat milk Cocoa Cereals (except in list II) Hard candies  Buttermilk Carbonated drinks Vegetables (except in list II) Sherbet  Coffee Fruits Sugar Honey  Tea Cottage Cheese Gelatin-jell-o Salt  Fruit juice Breads Angel food Cake   Herbs/spices Jams/Jellies [employer]    Foods that do not contain excessive purine content, but must be limited due to fat content  Cream Eggs Oil and Salad Dressing  Half and Half Peanut Butter Chocolate  Whole Milk Cakes Potato Chips  Butter Ice Cream Fried Foods  Cheese Nuts Waffles, pancakes   List II: Food with moderate purine content (50-150 mg of purine nitrogen per 100 grams of food)  Limit total amount each day to 5 oz. cooked Lean meat, other than those on list III   Poultry, other than those  on list III Fish, other than those on list III   Seafood, other than those on list III  These foods may be used occasionally  Peas Lentils Bran  Spinach Oatmeal Dried Beans and Peas  Asparagus Wheat Germ Mushrooms   Additional information about meat choices  Choose fish and poultry, particularly without skin, often.  Select lean, well trimmed cuts of meat.  Avoid all fatty meats, bacon , sausage, fried meats, fried fish, or poultry,  luncheon meats, cold cuts, hot dogs, meats canned or frozen in gravy, spareribs and frozen and packaged prepared meats.   List III: Foods with HIGH purine content / Foods to AVOID (150-800 mg of purine nitrogen per 100 grams of food)  Anchovies Herring Meat Broths  Liver Mackerel Meat Extracts  Kidney Scallops Meat Drippings  Sardines Wild Game Mincemeat  Sweetbreads Goose Gravy  Heart Tongue Yeast, baker's and brewers   Commercial soups made with any of the foods listed in List II or List III  In addition avoid all alcoholic beverages

## 2022-11-01 NOTE — Telephone Encounter (Signed)
Left detailed message requesting call back

## 2022-11-01 NOTE — Telephone Encounter (Signed)
10/28/2022-late entry.  Outreach made to Pt.  He was at an office visit with another provided her requested this nurse call back later.  Attempted to contact Pt later 10/28/2022-no answer.

## 2022-11-02 ENCOUNTER — Other Ambulatory Visit (HOSPITAL_COMMUNITY): Payer: Self-pay

## 2022-11-02 ENCOUNTER — Encounter: Payer: 59 | Admitting: Physician Assistant

## 2022-11-02 MED ORDER — AMIODARONE HCL 200 MG PO TABS
ORAL_TABLET | ORAL | 3 refills | Status: DC
Start: 2022-11-02 — End: 2023-01-06
  Filled 2022-11-02: qty 90, 34d supply, fill #0

## 2022-11-02 NOTE — Telephone Encounter (Signed)
Patient reports he was sitting down watching tv during this shock. Reports dizziness prior to shock. Patient reports compliance with mexiletine 250 mg BID. Pt made aware of [place] DMV driving restrictions x6 months/shock plan w/ verbal understanding. Routing to Dr. Elberta Fortis to advise.

## 2022-11-02 NOTE — Telephone Encounter (Signed)
Amiodarone sent to pharmacy. Attempted to contact patient to advise. No answer, LMTCB.   Will forwarding to EP scheduler to follow up about apt.

## 2022-11-03 ENCOUNTER — Other Ambulatory Visit (HOSPITAL_COMMUNITY): Payer: Self-pay

## 2022-11-03 ENCOUNTER — Telehealth: Payer: Self-pay | Admitting: *Deleted

## 2022-11-03 DIAGNOSIS — M1A0420 Idiopathic chronic gout, left hand, without tophus (tophi): Secondary | ICD-10-CM

## 2022-11-03 MED ORDER — ALLOPURINOL 300 MG PO TABS: 150.0000 mg | ORAL_TABLET | Freq: Every day | ORAL | 0 refills | Status: DC

## 2022-11-03 NOTE — Telephone Encounter (Signed)
Called pt and advised him NOT to start Amiodarone  (discussed w/ Camnitz, pt turned blue on Amiodarone in the past), per MD. Pharmacy called and told to cancel order. Pt informed Dr. Elberta Fortis recommends starting Sotalol and explained this will require a 3 day hospital adx. Pt needs to check on some things/schedule some things.  He will call me back to arrange adx.

## 2022-11-03 NOTE — Telephone Encounter (Signed)
Inbound call from patient, states he will be hospitalized because of his cardiologist. He states he wishes to schedule the procedure at the same time that he will be hospitalized. Please advise.

## 2022-11-03 NOTE — Telephone Encounter (Signed)
Patient contacted the office stating his prescription for Allopurinol was sent to [facility]. Patient states his prescription was sent to [facility] instead of [facility]. Patient advised would resend prescription to [facility]. Prescription resent.

## 2022-11-04 ENCOUNTER — Telehealth: Payer: Self-pay | Admitting: Cardiology

## 2022-11-04 NOTE — Telephone Encounter (Signed)
Called the patient. No answer.  Left message asking he call for an office visit to discuss and arrange his hospital colonoscopy.  The patient's cardiologist will be admitting him and starting him on Sotalol. In my message I did advise the patient that  colonoscopy during this admission is not an option.

## 2022-11-04 NOTE — Telephone Encounter (Signed)
Pt aware awaiting MD response.

## 2022-11-04 NOTE — Telephone Encounter (Signed)
Pt is calling back to find out what Dr. Elberta Fortis says about him having a colonoscopy in January.

## 2022-11-04 NOTE — Telephone Encounter (Signed)
New Message:       Patient wants to find out from Dr Elberta Fortis if he thinks that it will be alright for him to have a Colonoscopy in January?

## 2022-11-05 ENCOUNTER — Telehealth: Payer: Self-pay

## 2022-11-05 DIAGNOSIS — M1A0420 Idiopathic chronic gout, left hand, without tophus (tophi): Secondary | ICD-10-CM

## 2022-11-05 MED ORDER — COLCHICINE 0.6 MG PO CAPS: ORAL_CAPSULE | ORAL | 0 refills | Status: AC

## 2022-11-05 NOTE — Addendum Note (Signed)
Addended by: Fanny Bien B on: 11/05/2022 04:31 PM   Modules accepted: Orders

## 2022-11-05 NOTE — Telephone Encounter (Signed)
Received fax from pharmacy requesting colchicine 0.6 mg caps. Please advise.   LOV: 08/12/2022

## 2022-11-25 NOTE — Telephone Encounter (Signed)
Left message to call back  

## 2022-12-01 NOTE — Telephone Encounter (Signed)
Left message for patient to call back  

## 2022-12-02 ENCOUNTER — Other Ambulatory Visit: Payer: Self-pay

## 2022-12-02 DIAGNOSIS — I480 Paroxysmal atrial fibrillation: Secondary | ICD-10-CM

## 2022-12-02 MED ORDER — RIVAROXABAN 20 MG PO TABS: ORAL_TABLET | ORAL | 1 refills | Status: DC

## 2022-12-02 NOTE — Telephone Encounter (Signed)
Prescription refill request for Xarelto received.  Indication: Afib  Last office visit: 10/11/22 (Camnitz)  Weight: 112.5kg Age: 67 Scr: 0.86 (10/11/22)  CrCl: 132ml/min  Appropriate dose. Refill sent.

## 2022-12-03 ENCOUNTER — Telehealth: Payer: Self-pay | Admitting: Cardiology

## 2022-12-03 NOTE — Telephone Encounter (Signed)
Pharmacy please advise on holding Xarelto prior to colonoscopy scheduled for TBD. Thank you.

## 2022-12-03 NOTE — Telephone Encounter (Signed)
   Pre-operative Risk Assessment    Patient Name: Norman Rodriguez  DOB: February 28, 1955 MRN: [id number]      Request for Surgical Clearance    Procedure:   Colonoscopy  Date of Surgery:  Clearance TBD                                 Surgeon:  Dr. Shawnie Pons Group or Practice Name:  [facility] Phone number:  [phone] Fax number:     Type of Clearance Requested:   - Medical  - Pharmacy:  Hold    up to Cardiology   Type of Anesthesia:  Not Indicated   Additional requests/questions:   Patient called in wanting Dr. Anne Fu to clear him for his colonoscopy. He is overdue.  [name]   12/03/2022, 10:33 AM

## 2022-12-06 NOTE — Telephone Encounter (Signed)
Left message to call back  

## 2022-12-06 NOTE — Telephone Encounter (Signed)
   Name:  Norman Rodriguez  DOB:  [birth year]  MRN:  [id number]   Primary Cardiologist: Donato Schultz, MD  Chart reviewed as part of pre-operative protocol coverage. Patient was contacted 12/06/2022 in reference to pre-operative risk assessment for pending surgery as outlined below.  Norman Rodriguez was last seen on 10/11/2022 by Dr.Camnitz.  Since that day, Norman Rodriguez will require stabilization of his arrhythmias first and follow up appointment with Dr. Elberta Fortis.  Per Dr. Elberta Fortis  "Need to stabilize his arrhythmias prior to colonoscopy"  Due to new or worsening symptoms, Norman Rodriguez will require a follow-up visit for further pre-operative risk assessment.  Pre-op covering staff: - Please schedule appointment and call patient to inform them. If patient already had an upcoming appointment within acceptable timeframe, please add "pre-op clearance" to the appointment notes so provider is aware. - Please contact requesting surgeon's office via preferred method (i.e, phone, fax) to inform them of need for appointment prior to surgery.  Joni Reining, NP 12/06/2022, 8:37 AM

## 2022-12-07 NOTE — Telephone Encounter (Signed)
Patient left me a voicemail on 11.08.24 at 1:30pm stating he had received a call from "[facility] [place]" regarding the clearance. He says he is tired of this and that he just wants permission for the colonoscopy. He states he had spoken with Dr. Sharol Given office recently and states he was told that they need him to come in soon for this as the schedule is getting full for the colonoscopy's. He states he doesn't need to come back in to see Dr. Elberta Fortis until he has his colonoscopy. Please call.

## 2022-12-07 NOTE — Telephone Encounter (Signed)
I called the patient and left a VM. I explained that the appointment with [facility] is required for him to move forward with the colonoscopy.  I did recommend that he call Dr. Elberta Fortis office to discuss this further, as I understood that he want to move forward with the colonoscopy.

## 2022-12-08 NOTE — Telephone Encounter (Signed)
Pt called to return [employer] call. He is continuing with appt set for 12/17 with Renee for the pre-op clearance appt.

## 2022-12-08 NOTE — Telephone Encounter (Signed)
See notes pt called back and will keep his appt 01/11/23 with Francis Dowse, PAC. I will update all parties involved.

## 2022-12-29 ENCOUNTER — Telehealth: Payer: Self-pay | Admitting: Family Medicine

## 2022-12-29 NOTE — Telephone Encounter (Signed)
Pt want to rheumotologist  ( christopher rice ) sent the paperwork over to you yet of his information. Please call the patient

## 2023-01-03 ENCOUNTER — Ambulatory Visit (INDEPENDENT_AMBULATORY_CARE_PROVIDER_SITE_OTHER): Payer: 59

## 2023-01-03 DIAGNOSIS — I255 Ischemic cardiomyopathy: Secondary | ICD-10-CM | POA: Diagnosis not present

## 2023-01-03 DIAGNOSIS — I5022 Chronic systolic (congestive) heart failure: Secondary | ICD-10-CM

## 2023-01-04 LAB — CUP PACEART REMOTE DEVICE CHECK
Battery Remaining Longevity: 101 mo
Battery Remaining Percentage: 83 %
Battery Voltage: 3.01 V
Brady Statistic RV Percent Paced: 3.5 %
Date Time Interrogation Session: 20241209010202
HighPow Impedance: 65 Ohm
Implantable Lead Connection Status: 753985
Implantable Lead Implant Date: 20221213
Implantable Lead Location: 753860
Implantable Pulse Generator Implant Date: 20221213
Lead Channel Impedance Value: 360 Ohm
Lead Channel Pacing Threshold Amplitude: 1 V
Lead Channel Pacing Threshold Pulse Width: 0.5 ms
Lead Channel Sensing Intrinsic Amplitude: 8.6 mV
Lead Channel Setting Pacing Amplitude: 2.5 V
Lead Channel Setting Pacing Pulse Width: 0.5 ms
Lead Channel Setting Sensing Sensitivity: 0.5 mV
Pulse Gen Serial Number: 810054243

## 2023-01-06 ENCOUNTER — Telehealth: Payer: Self-pay

## 2023-01-06 NOTE — Telephone Encounter (Signed)
   Alert received from [facility]:  Alert remote transmission: VT There was one VT arrhythmia that was successfully converted with one burst of ATP, sent to triage. There were 2 NSVT arrhythmias detected, episode #7 was greater than 20 beats

## 2023-01-06 NOTE — Telephone Encounter (Signed)
Call back received from Pt.  Advised Pt of VT episode successfully treated with ATP 01/05/2023.  Pt denies any symptoms.  States he was unaware of heart racing or ATP received.  Pt states compliance with medications as ordered.   REMOVED AMIODARONE from Pt med list.  This medication causes Pt to turn blue.  Advised Pt of DMV guidelines stating no driving x 6 months.  Pt states he uses a service for transportation.  Confirmed Pt aware of upcoming appointment with EP APP on 01/11/2023 at 1:30 pm.  Pt states he has scheduled driver for this appointment.  Will send to physician for awareness.

## 2023-01-06 NOTE — Telephone Encounter (Signed)
Attempted outreach.  Left VM requesting call back.

## 2023-01-07 NOTE — Telephone Encounter (Signed)
Call back received from Pt.  Pt calling back because he is not sure he actually has an allergy to amiodarone.  Also states the mexitil is causing him to have headaches and swimmy headed.  Advised to discuss at upcoming appointment.  He indicates understanding.
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
## 2023-01-10 ENCOUNTER — Other Ambulatory Visit: Payer: Self-pay | Admitting: Internal Medicine

## 2023-01-10 DIAGNOSIS — M1A0420 Idiopathic chronic gout, left hand, without tophus (tophi): Secondary | ICD-10-CM

## 2023-01-11 ENCOUNTER — Ambulatory Visit: Payer: 59 | Attending: Physician Assistant | Admitting: Physician Assistant

## 2023-01-11 VITALS — BP 102/64 | HR 72 | Ht 67.0 in | Wt 248.0 lb

## 2023-01-11 DIAGNOSIS — Z95810 Presence of automatic (implantable) cardiac defibrillator: Secondary | ICD-10-CM

## 2023-01-11 DIAGNOSIS — D6869 Other thrombophilia: Secondary | ICD-10-CM

## 2023-01-11 DIAGNOSIS — I2510 Atherosclerotic heart disease of native coronary artery without angina pectoris: Secondary | ICD-10-CM

## 2023-01-11 DIAGNOSIS — Z79899 Other long term (current) drug therapy: Secondary | ICD-10-CM | POA: Diagnosis not present

## 2023-01-11 DIAGNOSIS — I4821 Permanent atrial fibrillation: Secondary | ICD-10-CM

## 2023-01-11 DIAGNOSIS — I5022 Chronic systolic (congestive) heart failure: Secondary | ICD-10-CM | POA: Diagnosis not present

## 2023-01-11 DIAGNOSIS — I472 Ventricular tachycardia, unspecified: Secondary | ICD-10-CM

## 2023-01-11 DIAGNOSIS — I255 Ischemic cardiomyopathy: Secondary | ICD-10-CM

## 2023-01-11 LAB — CUP PACEART INCLINIC DEVICE CHECK
Battery Remaining Longevity: 104 mo
Brady Statistic RV Percent Paced: 3.5 %
Date Time Interrogation Session: 20241217173027
HighPow Impedance: 67.5 Ohm
Implantable Lead Connection Status: 753985
Implantable Lead Implant Date: 20221213
Implantable Lead Location: 753860
Implantable Pulse Generator Implant Date: 20221213
Lead Channel Impedance Value: 387.5 Ohm
Lead Channel Pacing Threshold Amplitude: 1 V
Lead Channel Pacing Threshold Amplitude: 1 V
Lead Channel Pacing Threshold Pulse Width: 0.5 ms
Lead Channel Pacing Threshold Pulse Width: 0.5 ms
Lead Channel Sensing Intrinsic Amplitude: 9.5 mV
Lead Channel Setting Pacing Amplitude: 2.5 V
Lead Channel Setting Pacing Pulse Width: 0.5 ms
Lead Channel Setting Sensing Sensitivity: 0.5 mV
Pulse Gen Serial Number: 810054243

## 2023-01-11 MED ORDER — AMIODARONE HCL 200 MG PO TABS: 200.0000 mg | ORAL_TABLET | Freq: Every day | ORAL | 3 refills | Status: DC

## 2023-01-11 NOTE — Patient Instructions (Addendum)
Medication Instructions:   START TAKING: AMIODARONE  (200 MG TABLETS)   FOR FIRST TWO WEEKS : TAKE 400 MG TWICE A DAY   SECOND TWO WEEKS:   TAKE 200 MG TWICE  A DAY   5TH WEEK THEN RESUME TAKING:  TAKE 200 MG ONCE A DAY    *If you need a refill on your cardiac medications before your next appointment, please call your pharmacy*   Lab Work:   PLEASE GO DOWN STAIRS FIRST FLOOR  [address] :  CM[facility]  MAG AND BNP TODAY     If you have labs (blood work) drawn today and your tests are completely normal, you will receive your results only by: MyChart Message (if you have MyChart) OR A paper copy in the mail If you have any lab test that is abnormal or we need to change your treatment, we will call you to review the results.    Follow-Up: At [facility], you and your health needs are our priority.  As part of our continuing mission to provide you with exceptional heart care, we have created designated Provider Care Teams.  These Care Teams include your primary Cardiologist (physician) and Advanced Practice Providers (APPs -  Physician Assistants and Nurse Practitioners) who all work together to provide you with the care you need, when you need it.  We recommend signing up for the patient portal called "MyChart".  Sign up information is provided on this After Visit Summary.  MyChart is used to connect with patients for Virtual Visits (Telemedicine).  Patients are able to view lab/test results, encounter notes, upcoming appointments, etc.  Non-urgent messages can be sent to your provider as well.   To learn more about what you can do with MyChart, go to [email or web address].    Your next appointment:    1 month(s)  ( CONTACT  CASSIE HALL/ ANGELINE HAMMER FOR EP SCHEDULING ISSUES )    Provider:   You may see Will Jorja Loa, MD or one of the following Advanced Practice Providers on your designated Care Team:   Francis Dowse, [place]   Other Instructions

## 2023-01-12 LAB — PRO B NATRIURETIC PEPTIDE: NT-Pro BNP: 1520 pg/mL — ABNORMAL HIGH (ref 0–376)

## 2023-01-12 LAB — COMPREHENSIVE METABOLIC PANEL
ALT: 32 [IU]/L (ref 0–44)
AST: 30 [IU]/L (ref 0–40)
Albumin: 4.2 g/dL (ref 3.9–4.9)
Alkaline Phosphatase: 80 [IU]/L (ref 44–121)
BUN/Creatinine Ratio: 8 — ABNORMAL LOW (ref 10–24)
BUN: 8 mg/dL (ref 8–27)
Bilirubin Total: 1.1 mg/dL (ref 0.0–1.2)
CO2: 26 mmol/L (ref 20–29)
Calcium: 9.8 mg/dL (ref 8.6–10.2)
Chloride: 100 mmol/L (ref 96–106)
Creatinine, Ser: 0.97 mg/dL (ref 0.76–1.27)
Globulin, Total: 2.7 g/dL (ref 1.5–4.5)
Glucose: 104 mg/dL — ABNORMAL HIGH (ref 70–99)
Potassium: 4.2 mmol/L (ref 3.5–5.2)
Sodium: 140 mmol/L (ref 134–144)
Total Protein: 6.9 g/dL (ref 6.0–8.5)
eGFR: 85 mL/min/{1.73_m2} (ref >59)

## 2023-01-12 LAB — DIGOXIN LEVEL: Digoxin, Serum: 0.7 ng/mL (ref 0.5–0.9)

## 2023-01-12 LAB — MAGNESIUM: Magnesium: 1.9 mg/dL (ref 1.6–2.3)

## 2023-01-13 ENCOUNTER — Telehealth: Payer: Self-pay

## 2023-01-13 ENCOUNTER — Telehealth: Payer: Self-pay | Admitting: Cardiology

## 2023-01-13 ENCOUNTER — Telehealth: Payer: Self-pay | Admitting: *Deleted

## 2023-01-13 ENCOUNTER — Other Ambulatory Visit: Payer: Self-pay | Admitting: *Deleted

## 2023-01-13 DIAGNOSIS — I5022 Chronic systolic (congestive) heart failure: Secondary | ICD-10-CM

## 2023-01-13 MED ORDER — FUROSEMIDE 40 MG PO TABS: 40.0000 mg | ORAL_TABLET | Freq: Two times a day (BID) | ORAL | 2 refills | Status: DC

## 2023-01-13 NOTE — Telephone Encounter (Signed)
Alert received from [facility]:  Alert remote transmission: VT VT x 2 that were both successfully converted with one burst of ATP, rate is 153 bpm, sent to triage per protocol 14 NSVT.  Known VT, adjusting antiarrhythmics according to previous reports.  Reviewed transmission.  NSVT episodes noted all show an end to the episode.    Pt with new start amiodarone.  Continues on mexiletine until amiodarone has been loaded.  Discussed with RU.  Will see if Pt is willing to increase mexiletine to TID while amiodarone is loading.  Will also discuss with WC to see if any further recommendations.

## 2023-01-13 NOTE — Telephone Encounter (Signed)
Patient returned staff call regarding results.

## 2023-01-13 NOTE — Telephone Encounter (Signed)
-----   Message from Sheilah Pigeon sent at 01/12/2023 11:51 AM EST ----- BNP is elevated, suggests that he is retaining fluid.  This may be contributing some to his increased VT/arrhythmia burden.    Advise he take his lasix 80mg  (2 tabs) in the morning and 40mg  (1 tab) at night for 3 days then resume 40mg  BID   Stable labs otherwise

## 2023-01-13 NOTE — Telephone Encounter (Signed)
Outreach made to Norman Rodriguez.  Per Norman Rodriguez he states he is feeling ok.  He is concerned because he currently does not have any mexiletine on hand.  Does not have a dose for tonight.  Also needs his digoxin and furosemide.  Advised Norman Rodriguez would reach out to his pharmacy to confirm they are being delivered today.  Norman Rodriguez concerned that he is having to take more furosemide.  Upon further discussion, Norman Rodriguez states he was advised by Dr. Dimple Casey to drink 16 glasses of fluid daily.  Advised at this time, he should watch his fluid intake.  Advised the extra fluid is building up in his lungs and could be aggravating his heart.  He indicates understanding.  He is taking his amiodarone as ordered.  Outreach made to [employer].  Confirmed with pharmacist that the 3 medications will arrive to Norman Rodriguez today.  Discussed current alert with Dr. Elberta Fortis.  Will continue to monitor as long as ATP continues to be successful.  No change in drug therapies.  Norman Rodriguez is loading on amiodarone and continues on mexiletine.

## 2023-01-13 NOTE — Telephone Encounter (Signed)
Lvm to call back direct number [phone] for results and recommendations

## 2023-01-24 ENCOUNTER — Ambulatory Visit: Payer: 59 | Admitting: Family Medicine

## 2023-02-01 ENCOUNTER — Ambulatory Visit: Payer: 59 | Admitting: Internal Medicine

## 2023-02-02 ENCOUNTER — Telehealth: Payer: Self-pay | Admitting: Internal Medicine

## 2023-02-02 DIAGNOSIS — M1A0420 Idiopathic chronic gout, left hand, without tophus (tophi): Secondary | ICD-10-CM

## 2023-02-02 NOTE — Telephone Encounter (Signed)
 Patient advised The fluid recommendation is general advise to minimize the risk of gout. Dr. Jeannetta would defer to the cardiologist recommendation if they think he needs to restricted water intake more tightly. If gout does become active due to changing fluid intake, we can always adjust his allopurinol  dose. Patient verbalized understanding. Patient states he likes his current dose of allopurinol .

## 2023-02-02 NOTE — Telephone Encounter (Signed)
 The fluid recommendation is general advise to minimize the risk of gout. I would defer to the cardiologist recommendation if they think he needs to restricted water intake more tightly. If gout does become active due to changing fluid intake, we can always adjust his allopurinol  dose.

## 2023-02-02 NOTE — Telephone Encounter (Signed)
 Patient called stating Dr. Dimple Casey advised him to drink 16 glasses of fluid (at least 8 of them water) daily and his cardiologist advised him that the extra fluid could be affecting his heart.  Patient requested a return call.

## 2023-02-07 ENCOUNTER — Ambulatory Visit (INDEPENDENT_AMBULATORY_CARE_PROVIDER_SITE_OTHER): Payer: 59

## 2023-02-07 DIAGNOSIS — Z0000 Encounter for general adult medical examination without abnormal findings: Secondary | ICD-10-CM

## 2023-02-07 NOTE — Progress Notes (Signed)
 Subjective:   Norman Rodriguez is a 68 y.o. male who presents for Medicare Annual/Subsequent preventive examination.  Visit Complete: Virtual I connected with  Norman Rodriguez on 02/07/23 by a audio enabled telemedicine application and verified that I am speaking with the correct person using two identifiers.  Interactive audio and video telecommunications were attempted between this provider and patient, however failed, due to patient having technical difficulties OR patient did not have access to video capability.  We continued and completed visit with audio only.  Patient Location: Home  Provider Location: Office/Clinic  I discussed the limitations of evaluation and management by telemedicine. The patient expressed understanding and agreed to proceed.  Vital Signs: Because this visit was a virtual/telehealth visit, some criteria may be missing or patient reported. Any vitals not documented were not able to be obtained and vitals that have been documented are patient reported.    Cardiac Risk Factors include: advanced age (>44men, >49 women);dyslipidemia;hypertension;male gender     Objective:    Today's Vitals   There is no height or weight on file to calculate BMI.     02/07/2023    3:13 PM 01/05/2021    5:02 AM 10/22/2017    6:00 PM 06/07/2014   11:03 AM 05/16/2014   11:10 AM 05/14/2013    2:17 PM 04/24/2013   12:07 PM  Advanced Directives  Does Patient Have a Medical Advance Directive? No No No No No Patient does not have advance directive;Patient would not like information Patient does not have advance directive;Patient would not like information  Would patient like information on creating a medical advance directive? No - Patient declined  No - Patient declined No - patient declined information No - patient declined information    Pre-existing out of facility DNR order (yellow form or pink MOST form)      No No    Current Medications (verified) Outpatient Encounter  Medications as of 02/07/2023  Medication Sig   Acetaminophen  Extra Strength 500 MG TABS TAKE 1-2 TABLETS BY MOUTH THREE (THREE) TIMES DAILY AS NEEDED.   allopurinol  (ZYLOPRIM ) 300 MG tablet Take 0.5 tablets (150 mg total) by mouth daily.   amiodarone  (PACERONE ) 200 MG tablet Take 1 tablet (200 mg total) by mouth daily. 400 MG TWICE A DAY 2 WEEKS 200 MG TWICE A DAY 2 WEEKS THEN DAILY   atorvastatin  (LIPITOR ) 40 MG tablet TAKE ONE TABLET BY MOUTH DAILY   carvedilol  (COREG ) 25 MG tablet TAKE ONE TABLET BY MOUTH TWICE A DAY   Coenzyme Q10 (CO Q-10) 200 MG CAPS Take 200 mg by mouth daily.   Colchicine  0.6 MG CAPS Day 1: Take 1 caps. If still having swelling, may take 1 cap the following day.   digoxin  (LANOXIN ) 0.125 MG tablet TAKE ONE TABLET BY MOUTH DAILY   fexofenadine (ALLEGRA) 180 MG tablet Take 180 mg by mouth daily.   furosemide  (LASIX ) 40 MG tablet Take 1 tablet (40 mg total) by mouth 2 (two) times daily.   mexiletine (MEXITIL) 250 MG capsule Take 1 capsule (250 mg total) by mouth 2 (two) times daily.   Multiple Vitamin (MULTIVITAMIN) tablet Take 1 tablet by mouth daily.   nitroGLYCERIN  (NITROSTAT ) 0.4 MG SL tablet Place 1 tablet (0.4 mg total) under the tongue every 5 (five) minutes as needed for chest pain.   NON FORMULARY Take 465 mg by mouth as needed (joint pain). HiActives Tart Cherry   Omega-3 Fatty Acids (OMEGA 3 PO) Take 1 capsule by mouth  daily.   OVER THE COUNTER MEDICATION Take 1 capsule by mouth daily at 6 (six) AM. INSTAFLEX SUPPLEMENT for joint pain.   rivaroxaban  (XARELTO ) 20 MG TABS tablet TAKE ONE TABLET BY MOUTH DAILY WITH SUPPER   sacubitril -valsartan  (ENTRESTO ) 24-26 MG Take 1 tablet by mouth 2 (two) times daily.   vitamin C  (ASCORBIC ACID ) 500 MG tablet Take 500 mg by mouth daily.   VITAMIN D, CHOLECALCIFEROL , PO Take 5,000 Units by mouth daily.   ANORO ELLIPTA  62.5-25 MCG/INH AEPB INHALE ONE PUFFS INTO THE LUNGS DAILY (Patient not taking: Reported on 02/07/2023)    gabapentin  (NEURONTIN ) 300 MG capsule Take 2 capsules (600 mg total) by mouth at bedtime.   LevOCARNitine (CARNITINE PO) Take 500 mg by mouth daily. (Patient not taking: Reported on 02/07/2023)   potassium chloride  SA (KLOR-CON  M) 20 MEQ tablet Take 1 tablet (20 mEq total) by mouth as directed. Take 1 tablet daily for 4 days then hold others for later use as instructed (Patient not taking: Reported on 02/07/2023)   No facility-administered encounter medications on file as of 02/07/2023.    Allergies (verified) Amiodarone , Apple juice, Soybean oil, Lisinopril, Other, Bean pod extract, Carrot [daucus carota], Grass extracts [gramineae pollens], Peach flavoring agent (non-screening), and Tree extract   History: Past Medical History:  Diagnosis Date   ACS (acute coronary syndrome) (HCC)    Arthritis    Atrial fibrillation (HCC)    a. Dx 2010. b. Initiated on Tikosyn  03/2013.   BRBPR (bright red blood per rectum) 10/22/2017   CAD (coronary artery disease)    a. BMS 2.22mm 23 to LAD. CTO of RCA unsuccessful, good left to right collaterals - 2010.   Chronic anticoagulation    Chronic systolic CHF (congestive heart failure) (HCC)    a. Both ischemic and tachy induced. b. Echo 04/2013: EF 30-35%, no RWMA, normal RV, mod dilated LA (before restoration of NSR).   COPD (chronic obstructive pulmonary disease) (HCC) 10/22/2017   Dysrhythmia    hx of atrial fibrilation   HTN (hypertension)    Hyperlipidemia    Melena    NSTEMI (non-ST elevated myocardial infarction) (HCC) 01/05/2021   NSVT (nonsustained ventricular tachycardia) (HCC)    Obesity    Polyp of cecum    Polyp of descending colon    Polyp of rectum    Polyp of transverse colon    Shortness of breath    Past Surgical History:  Procedure Laterality Date   CARDIAC DEFIBRILLATOR PLACEMENT  2024   CARDIOVERSION N/A 04/27/2013   Procedure: BEDSIDE CARDIOVERSION;  Surgeon: Jerel Balding, MD;  Location: [facility] OR;  Service: Cardiovascular;   Laterality: N/A;   CARDIOVERSION N/A 05/15/2013   Procedure: CARDIOVERSION/BEDSIDE;  Surgeon: Ezra GORMAN Shuck, MD;  Location: [facility] OR;  Service: Cardiovascular;  Laterality: N/A;   CARDIOVERSION N/A 05/16/2014   Procedure: CARDIOVERSION;  Surgeon: Oneil JAYSON Parchment, MD;  Location: [facility] ENDOSCOPY;  Service: Cardiovascular;  Laterality: N/A;   CARDIOVERSION N/A 06/07/2014   Procedure: CARDIOVERSION;  Surgeon: Oneil JAYSON Parchment, MD;  Location: [facility] ENDOSCOPY;  Service: Cardiovascular;  Laterality: N/A;   COLONOSCOPY WITH PROPOFOL  N/A 10/27/2017   Procedure: COLONOSCOPY WITH PROPOFOL ;  Surgeon: Shila Gustav GAILS, MD;  Location: [facility] ENDOSCOPY;  Service: Endoscopy;  Laterality: N/A;   CORONARY ANGIOPLASTY WITH STENT PLACEMENT  01/26/2008   BMS 2.15mm 23 to LAD. CTO of RCA unsuccessful, good left to right collaterals   ESOPHAGOGASTRODUODENOSCOPY (EGD) WITH PROPOFOL  N/A 10/24/2017   Procedure: ESOPHAGOGASTRODUODENOSCOPY (EGD) WITH PROPOFOL ;  Surgeon:  Nandigam, Kavitha V, MD;  Location: [facility] ENDOSCOPY;  Service: Endoscopy;  Laterality: N/A;   FOOT SURGERY Right    ICD IMPLANT N/A 01/06/2021   Procedure: ICD IMPLANT;  Surgeon: Inocencio Soyla Lunger, MD;  Location: [facility] INVASIVE CV LAB;  Service: Cardiovascular;  Laterality: N/A;   LEFT HEART CATH AND CORONARY ANGIOGRAPHY N/A 01/05/2021   Procedure: LEFT HEART CATH AND CORONARY ANGIOGRAPHY;  Surgeon: Court Dorn PARAS, MD;  Location: [facility] INVASIVE CV LAB;  Service: Cardiovascular;  Laterality: N/A;   POLYPECTOMY  10/27/2017   Procedure: POLYPECTOMY;  Surgeon: Shila Gustav GAILS, MD;  Location: [facility] ENDOSCOPY;  Service: Endoscopy;;   Family History  Problem Relation Age of Onset   Other Father        Car accident   Coronary artery disease Neg Hx    Social History   Socioeconomic History   Marital status: Single    Spouse name: Not on file   Number of children: Not on file   Years of education: Not on file   Highest education level: Not on file  Occupational History    Not on file  Tobacco Use   Smoking status: Former    Types: Cigars    Passive exposure: Never   Smokeless tobacco: Never   Tobacco comments:    1 cigar per month states he stopped smoking  Vaping Use   Vaping status: Never Used  Substance and Sexual Activity   Alcohol use: Yes    Alcohol/week: 3.0 standard drinks of alcohol    Types: 3 Glasses of wine per week   Drug use: No    Comment: 1 per month...pt states he stopped smoking   Sexual activity: Not Currently  Other Topics Concern   Not on file  Social History Narrative   Not on file   Social Drivers of Health   Financial Resource Strain: Low Risk  (02/07/2023)   Overall Financial Resource Strain (CARDIA)    Difficulty of Paying Living Expenses: Not hard at all  Food Insecurity: No Food Insecurity (02/07/2023)   Hunger Vital Sign    Worried About Running Out of Food in the Last Year: Never true    Ran Out of Food in the Last Year: Never true  Transportation Needs: No Transportation Needs (02/07/2023)   PRAPARE - Administrator, Civil Service (Medical): No    Lack of Transportation (Non-Medical): No  Physical Activity: Inactive (02/07/2023)   Exercise Vital Sign    Days of Exercise per Week: 0 days    Minutes of Exercise per Session: 0 min  Stress: No Stress Concern Present (02/07/2023)   [employer] of Occupational Health - Occupational Stress Questionnaire    Feeling of Stress : Not at all  Social Connections: Moderately Isolated (02/07/2023)   Social Connection and Isolation Panel [NHANES]    Frequency of Communication with Friends and Family: More than three times a week    Frequency of Social Gatherings with Friends and Family: Not on file    Attends Religious Services: More than 4 times per year    Active Member of [employer] or Organizations: No    Attends Banker Meetings: Never    Marital Status: Never married    Tobacco Counseling Counseling given: Not Answered Tobacco comments: 1  cigar per month states he stopped smoking   Clinical Intake:  Pre-visit preparation completed: Yes  Pain : No/denies pain     Nutritional Risks: None Diabetes: No  How often do you  need to have someone help you when you read instructions, pamphlets, or other written materials from your doctor or pharmacy?: 1 - Never  Interpreter Needed?: No  Information entered by :: NAllen LPN   Activities of Daily Living    02/07/2023    2:57 PM  In your present state of health, do you have any difficulty performing the following activities:  Hearing? 0  Vision? 0  Difficulty concentrating or making decisions? 0  Walking or climbing stairs? 1  Comment trouble coming down  Dressing or bathing? 0  Doing errands, shopping? 0  Preparing Food and eating ? N  Using the Toilet? N  In the past six months, have you accidently leaked urine? Y  Comment due to lasix   Do you have problems with loss of bowel control? N  Managing your Medications? N  Managing your Finances? N  Housekeeping or managing your Housekeeping? N    Patient Care Team: [name], MD as PCP - General (Family Medicine) Jeffrie Oneil BROCKS, MD as PCP - Cardiology (Cardiology) Inocencio Soyla Lunger, MD as PCP - Electrophysiology (Cardiology)  Indicate any recent Medical Services you may have received from other than [facility] providers in the past year (date may be approximate).     Assessment:   This is a routine wellness examination for [facility].  Hearing/Vision screen Hearing Screening - Comments:: Denies hearing issues Vision Screening - Comments:: No regular eye exams   Goals Addressed             This Visit's Progress    Patient Stated       02/07/2023, wants to decrease medications       Depression Screen    02/07/2023    3:18 PM 08/12/2022    1:50 PM 06/10/2022    2:23 PM 06/01/2021    2:53 PM  PHQ 2/9 Scores  PHQ - 2 Score 0 3 1 0    Fall Risk    02/07/2023    3:15 PM 08/12/2022    1:49 PM  06/10/2022    2:22 PM 06/01/2021    2:53 PM  Fall Risk   Falls in the past year? 0 1 0 1  Number falls in past yr: 0 0 0 0  Injury with Fall? 0 1 0 1  Risk for fall due to : Medication side effect;Impaired balance/gait;Impaired mobility History of fall(s) Impaired balance/gait   Follow up Falls prevention discussed;Falls evaluation completed  Falls evaluation completed     MEDICARE RISK AT HOME: Medicare Risk at Home Any stairs in or around the home?: Yes If so, are there any without handrails?: No Home free of loose throw rugs in walkways, pet beds, electrical cords, etc?: Yes Adequate lighting in your home to reduce risk of falls?: Yes Life alert?: No Use of a cane, walker or w/c?: Yes Grab bars in the bathroom?: No Shower chair or bench in shower?: No Elevated toilet seat or a handicapped toilet?: No  TIMED UP AND GO:  Was the test performed?  No    Cognitive Function:        02/07/2023    3:18 PM  6CIT Screen  What Year? 0 points  What month? 0 points  What time? 0 points  Count back from 20 0 points  Months in reverse 0 points  Repeat phrase 2 points  Total Score 2 points    Immunizations Immunization History  Administered Date(s) Administered   Fluad Quad(high Dose 65+) 11/11/2022   Influenza,inj,Quad  PF,6+ Mos 10/23/2017   Influenza,inj,quad, With Preservative 12/13/2016   PFIZER(Purple Top)SARS-COV-2 Vaccination 04/21/2019, 05/12/2019, 11/16/2019   Pfizer(Comirnaty)Fall Seasonal Vaccine 12 years and older 11/11/2022   Pneumococcal Polysaccharide-23 06/22/2018   Tdap 08/14/2021   Zoster Recombinant(Shingrix) 08/14/2021, 01/29/2022    TDAP status: Due, Education has been provided regarding the importance of this vaccine. Advised may receive this vaccine at local pharmacy or Health Dept. Aware to provide a copy of the vaccination record if obtained from local pharmacy or Health Dept. Verbalized acceptance and understanding.  Flu Vaccine status: Up to  date  Pneumococcal vaccine status: Due, Education has been provided regarding the importance of this vaccine. Advised may receive this vaccine at local pharmacy or Health Dept. Aware to provide a copy of the vaccination record if obtained from local pharmacy or Health Dept. Verbalized acceptance and understanding.  Covid-19 vaccine status: Completed vaccines  Qualifies for Shingles Vaccine? Yes   Zostavax completed Yes   Shingrix Completed?: Yes  Screening Tests Health Maintenance  Topic Date Due   Hepatitis C Screening  Never done   Pneumonia Vaccine 63+ Years old (2 of 2 - PCV) 06/22/2019   Colonoscopy  10/27/2020   COVID-19 Vaccine (5 - 2024-25 season) 01/06/2023   Medicare Annual Wellness (AWV)  02/07/2024   DTaP/Tdap/Td (2 - Td or Tdap) 08/15/2031   INFLUENZA VACCINE  Completed   Zoster Vaccines- Shingrix  Completed   HPV VACCINES  Aged Out    Health Maintenance  Health Maintenance Due  Topic Date Due   Hepatitis C Screening  Never done   Pneumonia Vaccine 86+ Years old (2 of 2 - PCV) 06/22/2019   Colonoscopy  10/27/2020   COVID-19 Vaccine (5 - 2024-25 season) 01/06/2023    Colorectal cancer screening: Type of screening: Colonoscopy. Completed 10/27/2017. Repeat every 3 years  Lung Cancer Screening: (Low Dose CT Chest recommended if Age 9-80 years, 20 pack-year currently smoking OR have quit w/in 15years.) does not qualify.   Lung Cancer Screening Referral: no  Additional Screening:  Hepatitis C Screening: does qualify;   Vision Screening: Recommended annual ophthalmology exams for early detection of glaucoma and other disorders of the eye. Is the patient up to date with their annual eye exam?  No  Who is the provider or what is the name of the office in which the patient attends annual eye exams? none If pt is not established with a provider, would they like to be referred to a provider to establish care? No .   Dental Screening: Recommended annual dental exams  for proper oral hygiene  Diabetic Foot Exam: n/a  Community Resource Referral / Chronic Care Management: CRR required this visit?  No   CCM required this visit?  No     Plan:     I have personally reviewed and noted the following in the patient's chart:   Medical and social history Use of alcohol, tobacco or illicit drugs  Current medications and supplements including opioid prescriptions. Patient is not currently taking opioid prescriptions. Functional ability and status Nutritional status Physical activity Advanced directives List of other physicians Hospitalizations, surgeries, and ER visits in previous 12 months Vitals Screenings to include cognitive, depression, and falls Referrals and appointments  In addition, I have reviewed and discussed with patient certain preventive protocols, quality metrics, and best practice recommendations. A written personalized care plan for preventive services as well as general preventive health recommendations were provided to patient.     Ardella FORBES Dawn, LPN  02/07/2023   After Visit Summary: (Pick Up) Due to this being a telephonic visit, with patients personalized plan was offered to patient and patient has requested to Pick up at office.  Nurse Notes: none

## 2023-02-07 NOTE — Patient Instructions (Signed)
 Norman Rodriguez , Thank you for taking time to come for your Medicare Wellness Visit. I appreciate your ongoing commitment to your health goals. Please review the following plan we discussed and let me know if I can assist you in the future.   Referrals/Orders/Follow-Ups/Clinician Recommendations: none  This is a list of the screening recommended for you and due dates:  Health Maintenance  Topic Date Due   Hepatitis C Screening  Never done   Pneumonia Vaccine (2 of 2 - PCV) 06/22/2019   Colon Cancer Screening  10/27/2020   COVID-19 Vaccine (5 - 2024-25 season) 01/06/2023   Medicare Annual Wellness Visit  02/07/2024   DTaP/Tdap/Td vaccine (2 - Td or Tdap) 08/15/2031   Flu Shot  Completed   Zoster (Shingles) Vaccine  Completed   HPV Vaccine  Aged Out    Advanced directives: (ACP Link)Information on Advanced Care Planning can be found at [place]  Secretary of [facility] Advance Health Care Directives Advance Health Care Directives ([email or web address]   Next Medicare Annual Wellness Visit scheduled for next year: Yes  insert Preventive Care attachment Insert FALL PREVENTION attachment if needed

## 2023-02-08 ENCOUNTER — Other Ambulatory Visit: Payer: Self-pay | Admitting: Cardiology

## 2023-02-09 NOTE — Telephone Encounter (Signed)
 Patient contacted the office and inquires if his allopurinol  dose can be increased because his fingers are not closing and are weak. Patient is currently prescribed Allopurinol  300 mg tablet and takes half a tablet for 150 mg daily. Patient inquires if he can be prescribed Allopurinol  400 mg tablet so that he can split it in half and take 200 mg daily. Patient's pharmacy is [facility]. Please advise.

## 2023-02-23 ENCOUNTER — Ambulatory Visit: Payer: 59 | Admitting: Physician Assistant

## 2023-03-09 ENCOUNTER — Telehealth: Payer: Self-pay | Admitting: *Deleted

## 2023-03-09 NOTE — Telephone Encounter (Signed)
Patient contacted the office stating he needs a refill on his Allopurinol. Patient would like to know if he can increase his dose of Allopurinol to 400 mg. Patient states he will need a new prescription sent to the [facility]. Patient has a follow up scheduled for 04/25/2023.

## 2023-03-10 ENCOUNTER — Other Ambulatory Visit: Payer: Self-pay | Admitting: Family Medicine

## 2023-03-10 DIAGNOSIS — M19171 Post-traumatic osteoarthritis, right ankle and foot: Secondary | ICD-10-CM

## 2023-03-15 MED ORDER — ALLOPURINOL 100 MG PO TABS: 200.0000 mg | ORAL_TABLET | Freq: Every day | ORAL | 1 refills | Status: DC

## 2023-03-15 NOTE — Addendum Note (Signed)
 Addended by: Fuller Plan on: 03/15/2023 04:42 PM   Modules accepted: Orders

## 2023-03-15 NOTE — Telephone Encounter (Signed)
 We can increase allopurinol to 200 mg. There is no 400 mg tablet unfortunately. I can send a prescription for 100 mg tablets and take 2 daily. We will be rechecking uric acid level when we follow up.

## 2023-03-15 NOTE — Telephone Encounter (Signed)
 Attempted to contact the patient and left a message to call the office back.

## 2023-03-16 NOTE — Telephone Encounter (Signed)
 Attempted to contact the patient and left a message to call the office back.

## 2023-03-17 NOTE — Telephone Encounter (Signed)
 Patient advised We can increase allopurinol to 200 mg. There is no 400 mg tablet unfortunately. Dr. Dimple Casey can send a prescription for 100 mg tablets and take 2 daily. We will be rechecking uric acid level when we follow up. Patient verbalized understanding.

## 2023-03-31 ENCOUNTER — Other Ambulatory Visit: Payer: Self-pay | Admitting: Cardiology

## 2023-04-04 ENCOUNTER — Ambulatory Visit (INDEPENDENT_AMBULATORY_CARE_PROVIDER_SITE_OTHER): Payer: Medicare Other

## 2023-04-04 DIAGNOSIS — I5022 Chronic systolic (congestive) heart failure: Secondary | ICD-10-CM

## 2023-04-04 DIAGNOSIS — I255 Ischemic cardiomyopathy: Secondary | ICD-10-CM

## 2023-04-04 LAB — CUP PACEART REMOTE DEVICE CHECK
Battery Remaining Longevity: 98 mo
Battery Remaining Percentage: 80 %
Battery Voltage: 3.01 V
Brady Statistic RV Percent Paced: 8.2 %
Date Time Interrogation Session: 20250310030101
HighPow Impedance: 71 Ohm
Implantable Lead Connection Status: 753985
Implantable Lead Implant Date: 20221213
Implantable Lead Location: 753860
Implantable Pulse Generator Implant Date: 20221213
Lead Channel Impedance Value: 340 Ohm
Lead Channel Pacing Threshold Amplitude: 1 V
Lead Channel Pacing Threshold Pulse Width: 0.5 ms
Lead Channel Sensing Intrinsic Amplitude: 7.2 mV
Lead Channel Setting Pacing Amplitude: 2.5 V
Lead Channel Setting Pacing Pulse Width: 0.5 ms
Lead Channel Setting Sensing Sensitivity: 0.5 mV
Pulse Gen Serial Number: 810054243

## 2023-04-11 NOTE — Progress Notes (Unsigned)
 Office Visit Note  Patient: Norman Rodriguez             Date of Birth: [birth year]           MRN: [id number]             PCP: Garnette Gunner, MD Referring: Garnette Gunner, MD Visit Date: 04/12/2023   Subjective:  No chief complaint on file.   History of Present Illness: Norman Rodriguez is a 68 y.o. male here for follow up ***   Previous HPI 10/28/22 Norman Rodriguez is a 68 y.o. male here for evaluation and management of joint pain with osteoarthritis and gout.  Particularly with episodes of acute joint pain with associated stiffness and swelling started around February of this year first at his left third PIP joint started acutely shortly after midnight without any preceding injury he had eaten a large shrimp dinner at [employer].  Subsequently he had a few more episodes of this acute onset joint pain this year. Sometimes preceded by eating shrimp and he was initially treated with colchicine that improved the inflammation within a few days.  He did have a significant amount of diarrhea from the medication.  He saw his PCP Dr. Janee Morn for evaluation of this in May with lab workup for inflammatory arthritis had a uric acid of 8.2 otherwise pretty unremarkable per patient also questions possibility of psoriatic arthritis.  He started on allopurinol 100 mg daily has not experienced additional flareups of finger swelling since July.  He had a repeat uric acid checked at that visit of 6.5.  So far tolerating the medicine fine without additional exacerbations. Besides these flareups of joint inflammation he has chronic joint pain in multiple areas has been attributed to primary and posttraumatic osteoarthritis.  Did previous surgical fixation of right ankle fractures.  Also has a problem with numbness affecting his left hand especially the fourth and fifth fingers.  He describes diminished sensation pretty much all the time and sometimes gets symptoms up to the level of the elbow most  often in the morning or sometimes wakes him at night.  He takes gabapentin and Tylenol which are partially helpful cannot take NSAIDs due to his long-term anticoagulation. He has significant cardiovascular disease with systolic congestive heart failure on furosemide 40 mg daily for this.  He is in permanent atrial fibrillation and has had several episodes of ventricular tachycardia this year.  He did not tolerate amiodarone he was recently started on mexiletine.  BNP was elevated at 2459 last month.  He recently experienced a shock from his ICD on Saturday for Vtach which was the first time for this.     Labs reviewed 09/2022 Pro-BNP 2459   07/2022 Uric acid 6.5   05/2022 Uric acid 8.2 ANA neg RF neg HLA-B27 neg ESR 15 CRP <1   No Rheumatology ROS completed.   PMFS History:  Patient Active Problem List   Diagnosis Date Noted   Chronic gout of hand 06/16/2022   Finger swelling 06/13/2022   Post-traumatic osteoarthritis of multiple joints 06/01/2021   Toe pain, left 06/01/2021   Carpal tunnel syndrome of left wrist 06/01/2021   BMI 39.0-39.9,adult 06/01/2021   Ventricular tachycardia (HCC)    COPD (chronic obstructive pulmonary disease) (HCC) 10/22/2017   PAF (paroxysmal atrial fibrillation) (HCC) 05/08/2014   Chronic systolic heart failure (HCC) 03/29/2013   Cardiomyopathy (HCC)    CAD (coronary artery disease)    Chronic anticoagulation    NSVT (  nonsustained ventricular tachycardia) (HCC)    Hyperlipidemia    HTN (hypertension)     Past Medical History:  Diagnosis Date   ACS (acute coronary syndrome) (HCC)    Arthritis    Atrial fibrillation (HCC)    a. Dx 2010. b. Initiated on Tikosyn 03/2013.   BRBPR (bright red blood per rectum) 10/22/2017   CAD (coronary artery disease)    a. BMS 2.11mm 23 to LAD. CTO of RCA unsuccessful, good left to right collaterals - 2010.   Chronic anticoagulation    Chronic systolic CHF (congestive heart failure) (HCC)    a. Both ischemic and  tachy induced. b. Echo 04/2013: EF 30-35%, no RWMA, normal RV, mod dilated LA (before restoration of NSR).   COPD (chronic obstructive pulmonary disease) (HCC) 10/22/2017   Dysrhythmia    hx of atrial fibrilation   HTN (hypertension)    Hyperlipidemia    Melena    NSTEMI (non-ST elevated myocardial infarction) (HCC) 01/05/2021   NSVT (nonsustained ventricular tachycardia) (HCC)    Obesity    Polyp of cecum    Polyp of descending colon    Polyp of rectum    Polyp of transverse colon    Shortness of breath     Family History  Problem Relation Age of Onset   Other Father        Car accident   Coronary artery disease Neg Hx    Past Surgical History:  Procedure Laterality Date   CARDIAC DEFIBRILLATOR PLACEMENT  2024   CARDIOVERSION N/A 04/27/2013   Procedure: BEDSIDE CARDIOVERSION;  Surgeon: Thurmon Fair, MD;  Location: [facility] OR;  Service: Cardiovascular;  Laterality: N/A;   CARDIOVERSION N/A 05/15/2013   Procedure: CARDIOVERSION/BEDSIDE;  Surgeon: Laurey Morale, MD;  Location: [facility] OR;  Service: Cardiovascular;  Laterality: N/A;   CARDIOVERSION N/A 05/16/2014   Procedure: CARDIOVERSION;  Surgeon: Jake Bathe, MD;  Location: [facility] ENDOSCOPY;  Service: Cardiovascular;  Laterality: N/A;   CARDIOVERSION N/A 06/07/2014   Procedure: CARDIOVERSION;  Surgeon: Jake Bathe, MD;  Location: [facility] ENDOSCOPY;  Service: Cardiovascular;  Laterality: N/A;   COLONOSCOPY WITH PROPOFOL N/A 10/27/2017   Procedure: COLONOSCOPY WITH PROPOFOL;  Surgeon: Napoleon Form, MD;  Location: [facility] ENDOSCOPY;  Service: Endoscopy;  Laterality: N/A;   CORONARY ANGIOPLASTY WITH STENT PLACEMENT  01/26/2008   BMS 2.41mm 23 to LAD. CTO of RCA unsuccessful, good left to right collaterals   ESOPHAGOGASTRODUODENOSCOPY (EGD) WITH PROPOFOL N/A 10/24/2017   Procedure: ESOPHAGOGASTRODUODENOSCOPY (EGD) WITH PROPOFOL;  Surgeon: Napoleon Form, MD;  Location: [facility] ENDOSCOPY;  Service: Endoscopy;  Laterality: N/A;   FOOT SURGERY  Right    ICD IMPLANT N/A 01/06/2021   Procedure: ICD IMPLANT;  Surgeon: Regan Lemming, MD;  Location: [facility] INVASIVE CV LAB;  Service: Cardiovascular;  Laterality: N/A;   LEFT HEART CATH AND CORONARY ANGIOGRAPHY N/A 01/05/2021   Procedure: LEFT HEART CATH AND CORONARY ANGIOGRAPHY;  Surgeon: Runell Gess, MD;  Location: [facility] INVASIVE CV LAB;  Service: Cardiovascular;  Laterality: N/A;   POLYPECTOMY  10/27/2017   Procedure: POLYPECTOMY;  Surgeon: Napoleon Form, MD;  Location: [facility] ENDOSCOPY;  Service: Endoscopy;;   Social History   Social History Narrative   Not on file   Immunization History  Administered Date(s) Administered   Fluad Quad(high Dose 65+) 11/11/2022   Influenza,inj,Quad PF,6+ Mos 10/23/2017   Influenza,inj,quad, With Preservative 12/13/2016   PFIZER(Purple Top)SARS-COV-2 Vaccination 04/21/2019, 05/12/2019, 11/16/2019   Pfizer(Comirnaty)Fall Seasonal Vaccine 12 years and older 11/11/2022  Pneumococcal Polysaccharide-23 06/22/2018   Tdap 08/14/2021   Zoster Recombinant(Shingrix) 08/14/2021, 01/29/2022     Objective: Vital Signs: There were no vitals taken for this visit.   Physical Exam   Musculoskeletal Exam: ***  CDAI Exam: CDAI Score: -- Patient Global: --; Provider Global: -- Swollen: --; Tender: -- Joint Exam 04/12/2023   No joint exam has been documented for this visit   There is currently no information documented on the homunculus. Go to the Rheumatology activity and complete the homunculus joint exam.  Investigation: No additional findings.  Imaging: CUP PACEART REMOTE DEVICE CHECK Result Date: 04/04/2023 Scheduled remote reviewed. Normal device function.  7 new VHR episodes not previously reported to clinic, longest 10 sec, EGMs c/w VT at 152-170 bpm. Next remote 91 days. [facility], [facility]   Recent Labs: Lab Results  Component Value Date   WBC 6.8 10/11/2022   HGB 15.0 10/11/2022   PLT 199 10/11/2022   NA 140 01/11/2023   K 4.2  01/11/2023   CL 100 01/11/2023   CO2 26 01/11/2023   GLUCOSE 104 (H) 01/11/2023   BUN 8 01/11/2023   CREATININE 0.97 01/11/2023   BILITOT 1.1 01/11/2023   ALKPHOS 80 01/11/2023   AST 30 01/11/2023   ALT 32 01/11/2023   PROT 6.9 01/11/2023   ALBUMIN 4.2 01/11/2023   CALCIUM 9.8 01/11/2023   GFRAA 98 09/25/2018    Speciality Comments: No specialty comments available.  Procedures:  No procedures performed Allergies: Amiodarone, Apple juice, Soybean oil, Lisinopril, Other, Bean pod extract, Carrot [daucus carota], Grass extracts [gramineae pollens], Peach flavoring agent (non-screening), and Tree extract   Assessment / Plan:     Visit Diagnoses: No diagnosis found.  ***  Orders: No orders of the defined types were placed in this encounter.  No orders of the defined types were placed in this encounter.    Follow-Up Instructions: No follow-ups on file.   Fuller Plan, MD  Note - This record has been created using [employer].  Chart creation errors have been sought, but may not always  have been located. Such creation errors do not reflect on  the standard of medical care.

## 2023-04-12 ENCOUNTER — Encounter: Payer: Self-pay | Admitting: Internal Medicine

## 2023-04-12 ENCOUNTER — Ambulatory Visit: Attending: Internal Medicine | Admitting: Internal Medicine

## 2023-04-12 VITALS — BP 124/76 | HR 60 | Resp 16 | Ht 67.0 in | Wt 253.0 lb

## 2023-04-12 DIAGNOSIS — M1A0420 Idiopathic chronic gout, left hand, without tophus (tophi): Secondary | ICD-10-CM | POA: Diagnosis not present

## 2023-04-12 DIAGNOSIS — I5022 Chronic systolic (congestive) heart failure: Secondary | ICD-10-CM | POA: Diagnosis not present

## 2023-04-13 LAB — URIC ACID: Uric Acid, Serum: 5.7 mg/dL (ref 4.0–8.0)

## 2023-04-15 ENCOUNTER — Other Ambulatory Visit: Payer: Self-pay | Admitting: Cardiology

## 2023-04-25 ENCOUNTER — Ambulatory Visit: Payer: 59 | Admitting: Internal Medicine

## 2023-05-09 NOTE — Progress Notes (Signed)
 Remote ICD transmission.

## 2023-05-13 ENCOUNTER — Telehealth: Payer: Self-pay

## 2023-05-13 NOTE — Telephone Encounter (Signed)
 Patient called and left a voicemail requesting lab results/recommendations from visit on 04/12/2023.   Thanks!

## 2023-05-13 NOTE — Telephone Encounter (Signed)
 Uric acid was 5.7 which is at goal of <6, improved from 6.5 last time. He can continue the same allopurinol  dose 200 mg once daily.

## 2023-05-16 NOTE — Telephone Encounter (Signed)
 Attempted to contact the patient and left a message to call the office back.

## 2023-05-17 NOTE — Telephone Encounter (Signed)
 Patient advised Uric acid was 5.7 which is at goal of <6, improved from 6.5 last time. He can continue the same allopurinol  dose 200 mg once daily.

## 2023-06-01 ENCOUNTER — Other Ambulatory Visit: Payer: Self-pay | Admitting: Cardiology

## 2023-06-01 ENCOUNTER — Other Ambulatory Visit: Payer: Self-pay | Admitting: Internal Medicine

## 2023-06-01 DIAGNOSIS — M1A0420 Idiopathic chronic gout, left hand, without tophus (tophi): Secondary | ICD-10-CM

## 2023-06-01 DIAGNOSIS — I480 Paroxysmal atrial fibrillation: Secondary | ICD-10-CM

## 2023-06-01 NOTE — Telephone Encounter (Signed)
 Last Fill: 03/15/2023  Labs: 01/11/2023 CMP  Glucose 104 BUN/Creatinine Ratio 8  10/11/2022 CBC MCV 99  04/12/2023 Uric Acid 5.7  Next Visit: 10/18/2023  Last Visit: 04/12/2023  DX: Chronic gout of left hand, unspecified cause   Current Dose per office note 04/12/2023: allopurinol  200 mg daily   Attempted to contact the patient and left a message advising a lab is due and advised the patient of our lab office hours.   Okay to refill Allopurinol ?

## 2023-06-01 NOTE — Telephone Encounter (Signed)
 Prescription refill request for Xarelto  received.  Indication:afib Last office visit:12/24 Weight:114.8  kg Age:68 Scr:0.97  12/24 CrCl:115.06  ml/min  Prescription refilled

## 2023-07-01 ENCOUNTER — Other Ambulatory Visit: Payer: Self-pay

## 2023-07-01 DIAGNOSIS — I5022 Chronic systolic (congestive) heart failure: Secondary | ICD-10-CM

## 2023-07-01 MED ORDER — SACUBITRIL-VALSARTAN 24-26 MG PO TABS: 1.0000 | ORAL_TABLET | Freq: Two times a day (BID) | ORAL | 0 refills | Status: DC

## 2023-07-04 ENCOUNTER — Ambulatory Visit (INDEPENDENT_AMBULATORY_CARE_PROVIDER_SITE_OTHER): Payer: Medicare Other

## 2023-07-04 DIAGNOSIS — I5022 Chronic systolic (congestive) heart failure: Secondary | ICD-10-CM

## 2023-07-04 DIAGNOSIS — I255 Ischemic cardiomyopathy: Secondary | ICD-10-CM

## 2023-07-05 ENCOUNTER — Telehealth: Payer: Self-pay

## 2023-07-05 LAB — CUP PACEART REMOTE DEVICE CHECK
Battery Remaining Longevity: 97 mo
Battery Remaining Percentage: 79 %
Battery Voltage: 2.99 V
Brady Statistic RV Percent Paced: 8.1 %
Date Time Interrogation Session: 20250609020131
HighPow Impedance: 64 Ohm
Implantable Lead Connection Status: 753985
Implantable Lead Implant Date: 20221213
Implantable Lead Location: 753860
Implantable Pulse Generator Implant Date: 20221213
Lead Channel Impedance Value: 360 Ohm
Lead Channel Pacing Threshold Amplitude: 1 V
Lead Channel Pacing Threshold Pulse Width: 0.5 ms
Lead Channel Sensing Intrinsic Amplitude: 7.3 mV
Lead Channel Setting Pacing Amplitude: 2.5 V
Lead Channel Setting Pacing Pulse Width: 0.5 ms
Lead Channel Setting Sensing Sensitivity: 0.5 mV
Pulse Gen Serial Number: 810054243

## 2023-07-05 NOTE — Telephone Encounter (Addendum)
 Alert status for successful ATP delivered.  Event occurred 6/9 @ 16:37, duration 22sec per device, HR 181.  EGM c/w sustained VT pace terminated with 1 burst of ATP.  Patient states he is not feeling any of these VT events and fast heart rates.  Denies any SOB, palpitations, syncopal or near syncopal events.  States he is taking all of his medications and has had no noticeable changes in health recently.   Offered early am appointment with Mertha Abrahams PA-C, he refuses to make any appointments to follow up and refuses to have labs.  States he has dental, PCP and Dr. Renna Cary appts this month and due to transportation issues he is not able to make any other at this time.  Says he will follow up with us  next week and "see where he is".   I stressed danger and risks with his frequent VT events and risk for syncope and shock.  Still states he is unable to come in.    911/ER precautions given.  [place] DMV driving restrictions given for months.  Patient verbalizes understanding of risks of delaying care.   Forwarding to Dr. Lawana Pray to make aware and give recommendations.

## 2023-07-05 NOTE — Telephone Encounter (Signed)
 Reviewed with Dr. Lawana Pray:   He has offered ablation in the past, if he continues to refuse, it may take him receiving shocks/passing out before he is willing to make any changes.  Patient is aware he cannot drive for next 6 months.   Recommend in office follow up to discuss and labs.  LM to discuss Dr. Lawana Pray recommendations after viewing alert.

## 2023-07-06 NOTE — Telephone Encounter (Signed)
Attempted to contact patient. No answer, left message to call back

## 2023-07-08 NOTE — Telephone Encounter (Signed)
Attempted to contact patient. No answer, left message to call back

## 2023-07-11 ENCOUNTER — Encounter: Payer: Self-pay | Admitting: Family Medicine

## 2023-07-11 ENCOUNTER — Telehealth: Payer: Self-pay

## 2023-07-11 ENCOUNTER — Ambulatory Visit (INDEPENDENT_AMBULATORY_CARE_PROVIDER_SITE_OTHER): Admitting: Family Medicine

## 2023-07-11 ENCOUNTER — Ambulatory Visit: Payer: Self-pay | Admitting: Cardiology

## 2023-07-11 VITALS — Temp 97.6°F | Ht 67.0 in | Wt 249.6 lb

## 2023-07-11 DIAGNOSIS — M1A0420 Idiopathic chronic gout, left hand, without tophus (tophi): Secondary | ICD-10-CM | POA: Diagnosis not present

## 2023-07-11 DIAGNOSIS — R42 Dizziness and giddiness: Secondary | ICD-10-CM | POA: Diagnosis not present

## 2023-07-11 DIAGNOSIS — M1A3420 Chronic gout due to renal impairment, left hand, without tophus (tophi): Secondary | ICD-10-CM

## 2023-07-11 DIAGNOSIS — I472 Ventricular tachycardia, unspecified: Secondary | ICD-10-CM

## 2023-07-11 DIAGNOSIS — I480 Paroxysmal atrial fibrillation: Secondary | ICD-10-CM

## 2023-07-11 DIAGNOSIS — I255 Ischemic cardiomyopathy: Secondary | ICD-10-CM

## 2023-07-11 DIAGNOSIS — I2510 Atherosclerotic heart disease of native coronary artery without angina pectoris: Secondary | ICD-10-CM | POA: Diagnosis not present

## 2023-07-11 DIAGNOSIS — I82511 Chronic embolism and thrombosis of right femoral vein: Secondary | ICD-10-CM | POA: Insufficient documentation

## 2023-07-11 DIAGNOSIS — I4729 Other ventricular tachycardia: Secondary | ICD-10-CM

## 2023-07-11 DIAGNOSIS — I5022 Chronic systolic (congestive) heart failure: Secondary | ICD-10-CM

## 2023-07-11 NOTE — Patient Instructions (Addendum)
  VISIT SUMMARY: During your visit, we discussed your dizziness and neck pain, particularly when standing up, and reviewed your history of heart issues and deep vein thrombosis (DVT). We also addressed your recent fall, balance issues, and concerns about your defibrillator. Your current medications and their effects were reviewed, and we discussed the importance of hydration and proper management of your heart condition.  YOUR PLAN: -ORTHOSTATIC HYPOTENSION: Orthostatic hypotension is a condition where your blood pressure drops significantly when you stand up, causing dizziness. This may be worsened by your heart medications. To help manage this, stay well-hydrated, especially in hot weather, rise slowly from sitting to standing, and use compression stockings. We will also refer you to your cardiologist, Dr. Katherine Pancake, for further evaluation and management.  -HEART FAILURE WITH REDUCED EJECTION FRACTION (HFREF): Heart failure with reduced ejection fraction means your heart's left ventricle is not pumping effectively. We will continue your current heart medications and refer you to your cardiologist, Dr. Katherine Pancake, for further evaluation and management.  -SUPRAVENTRICULAR TACHYCARDIA (SVT): Supraventricular tachycardia is a condition where your heart beats very fast due to abnormal electrical signals. We will refer you to your cardiologist, Dr. Katherine Pancake, for further evaluation and management. Monitor for symptoms like palpitations or dizziness and seek immediate care if they occur.  -DEEP VEIN THROMBOSIS (DVT): Deep vein thrombosis is a condition where blood clots form in your veins. You are on Xarelto  to prevent further clots. Continue taking Xarelto  and monitor for any signs of bleeding or new clots.  -GOUT: Gout is a type of arthritis caused by high levels of uric acid in the blood. You are on allopurinol , which has kept your uric acid levels below 6. Continue taking allopurinol  and we will monitor your  uric acid levels.  -GOALS OF CARE: We discussed the importance of prioritizing your heart health and coordinating care with your cardiologist, Dr. Katherine Pancake. It is important to manage your heart condition to avoid potential risks.  INSTRUCTIONS: You have an appointment with Dr. Katherine Pancake on the 30th, but we will try to expedite this due to the urgency of your heart condition. Please ensure you have transportation and that your schedule aligns with your healthcare needs. Additionally, we will order blood work to check your digoxin  levels, BNP, metabolic panel, kidney function, electrolytes, magnesium , and thyroid function.

## 2023-07-11 NOTE — Telephone Encounter (Signed)
   Pre-operative Risk Assessment    Patient Name: Norman Rodriguez  DOB: November 06, 1955 MRN: [id number]   Date of last office visit: 01/11/23 Mertha Abrahams, PA-C Date of next office visit: 07/25/23 Dorothye Gathers, MD  Request for Surgical Clearance    Procedure:  Dental Extraction - Amount of Teeth to be Pulled:  1 TOOTH WITH DEEP DENTAL CLEANING  Date of Surgery:  Clearance TBD                                Surgeon:  DR MCNEIL Surgeon's Group or Practice Name:  [facility] Phone number:  [phone] Fax number:  [phone]    ATTN: DR MCNEIL   Type of Clearance Requested:   - Medical  - Pharmacy:  [facility]  (Xarelto )     Type of Anesthesia:  Local    Additional requests/questions:    SignedCollin Deal   07/11/2023, 8:55 AM

## 2023-07-11 NOTE — Telephone Encounter (Signed)
 Spoke with patient and attempted to schedule sooner follow-up with Dr. Renna Cary. Offered appointment with Dr. Renna Cary on June 19th, at 2pm. Pt unable to make this appointment due to dental procedure. Offered appointment tomorrow with Creighton Doffing, NP tomorrow 6/17 at 9am, but unable to make appointment due to transportation issues.   Per patient, lab work was received at PCP visit. Pt aware of driving restrictions and states he does not drive. Will review lab work and discuss with Dr. Lawana Pray.   Pt has scheduled follow-up with Dr. Renna Cary on June 30th at 3pm.
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
## 2023-07-11 NOTE — Telephone Encounter (Signed)
   Patient Name: Norman Rodriguez  DOB: 1955-02-01 MRN: [id number]  Primary Cardiologist: Dorothye Gathers, MD  Chart reviewed as part of pre-operative protocol coverage.   Dental extractions of 1-2 teeth are considered low risk procedures per guidelines and generally do not require any specific cardiac clearance. It is also generally accepted that for extractions of 1-2 teeth and dental cleanings, there is no need to interrupt blood thinner therapy.  SBE prophylaxis is not required for the patient from a cardiac standpoint.  I will route this recommendation to the requesting party via Epic fax function and remove from pre-op pool.  Please call with questions.  Morey Ar, NP 07/11/2023, 3:27 PM

## 2023-07-12 ENCOUNTER — Telehealth: Payer: Self-pay | Admitting: *Deleted

## 2023-07-12 LAB — COMPREHENSIVE METABOLIC PANEL WITH GFR
ALT: 24 U/L (ref 0–53)
AST: 23 U/L (ref 0–37)
Albumin: 4 g/dL (ref 3.5–5.2)
Alkaline Phosphatase: 64 U/L (ref 39–117)
BUN: 11 mg/dL (ref 6–23)
CO2: 32 meq/L (ref 19–32)
Calcium: 9.5 mg/dL (ref 8.4–10.5)
Chloride: 102 meq/L (ref 96–112)
Creatinine, Ser: 1.09 mg/dL (ref 0.40–1.50)
GFR: 68.88 mL/min (ref >60.00)
Glucose, Bld: 108 mg/dL — ABNORMAL HIGH (ref 70–99)
Potassium: 4.6 meq/L (ref 3.5–5.1)
Sodium: 140 meq/L (ref 135–145)
Total Bilirubin: 1.6 mg/dL — ABNORMAL HIGH (ref 0.2–1.2)
Total Protein: 7.2 g/dL (ref 6.0–8.3)

## 2023-07-12 LAB — URINALYSIS W MICROSCOPIC + REFLEX CULTURE
Bacteria, UA: NONE SEEN /HPF
Bilirubin Urine: NEGATIVE
Glucose, UA: NEGATIVE
Hgb urine dipstick: NEGATIVE
Hyaline Cast: NONE SEEN /LPF
Ketones, ur: NEGATIVE
Leukocyte Esterase: NEGATIVE
Nitrites, Initial: NEGATIVE
Protein, ur: NEGATIVE
RBC / HPF: NONE SEEN /HPF (ref 0–2)
Specific Gravity, Urine: 1.005 (ref 1.001–1.035)
Squamous Epithelial / HPF: NONE SEEN /HPF (ref <=5)
WBC, UA: NONE SEEN /HPF (ref 0–5)
pH: 5.5 (ref 5.0–8.0)

## 2023-07-12 LAB — CBC WITH DIFFERENTIAL/PLATELET
Basophils Absolute: 0.1 K/uL (ref 0.0–0.1)
Basophils Relative: 1.3 % (ref 0.0–3.0)
Eosinophils Absolute: 0 K/uL (ref 0.0–0.7)
Eosinophils Relative: 0.5 % (ref 0.0–5.0)
HCT: 42.9 % (ref 39.0–52.0)
Hemoglobin: 14.5 g/dL (ref 13.0–17.0)
Lymphocytes Relative: 24.2 % (ref 12.0–46.0)
Lymphs Abs: 1.4 K/uL (ref 0.7–4.0)
MCHC: 33.7 g/dL (ref 30.0–36.0)
MCV: 94.1 fl (ref 78.0–100.0)
Monocytes Absolute: 0.6 K/uL (ref 0.1–1.0)
Monocytes Relative: 10.4 % (ref 3.0–12.0)
Neutro Abs: 3.7 K/uL (ref 1.4–7.7)
Neutrophils Relative %: 63.6 % (ref 43.0–77.0)
Platelets: 184 K/uL (ref 150.0–400.0)
RBC: 4.56 Mil/uL (ref 4.22–5.81)
RDW: 15.6 % — ABNORMAL HIGH (ref 11.5–15.5)
WBC: 5.8 K/uL (ref 4.0–10.5)

## 2023-07-12 LAB — NO CULTURE INDICATED

## 2023-07-12 LAB — THYROID PANEL WITH TSH
Free Thyroxine Index: 2.4 (ref 1.4–3.8)
T3 Uptake: 37 % — ABNORMAL HIGH (ref 22–35)
T4, Total: 6.5 ug/dL (ref 4.9–10.5)
TSH: 2.96 m[IU]/L (ref 0.40–4.50)

## 2023-07-12 LAB — MAGNESIUM: Magnesium: 1.8 mg/dL (ref 1.5–2.5)

## 2023-07-12 LAB — BRAIN NATRIURETIC PEPTIDE: Pro B Natriuretic peptide (BNP): 679 pg/mL — ABNORMAL HIGH (ref 0.0–100.0)

## 2023-07-12 LAB — DIGOXIN LEVEL: Digoxin Level: 1.5 ug/L (ref 0.8–2.0)

## 2023-07-12 NOTE — Progress Notes (Unsigned)
 Complex Care Management Note Care Guide Note  07/12/2023 Name: Norman Rodriguez MRN: [id number] DOB: [birth year]   Complex Care Management Outreach Attempts: An unsuccessful telephone outreach was attempted today to offer the patient information about available complex care management services.  Follow Up Plan:  Additional outreach attempts will be made to offer the patient complex care management information and services.   Encounter Outcome:  No Answer  Kandis Ormond, CMA [place]  [facility], [facility] Guide Direct Dial: [phone]  Fax: [phone] Website: [email or web address]

## 2023-07-13 NOTE — Telephone Encounter (Signed)
 Reviewed again with Dr. Lawana Pray.  Pt had one day with increased ventricular ectopy (June 28, 2023) and had one episode of VT effectively treated with ATP on July 04, 2023.  Pt's ectopy prior to these 2 days has been largely quiescent on recent drug therapy.  Pt had lab work drawn by PCP on July 11, 2023 and electrolytes were WNL.  Attempted to schedule Pt for earlier follow up with Dr. Renna Cary or EP APP but Pt was not able to commit to any of these dates/times.    Pt has follow up scheduled with Dr. Renna Cary on July 25, 2023.  Will continue to monitor remotely for any further needs.

## 2023-07-13 NOTE — Progress Notes (Signed)
 Complex Care Management Note Care Guide Note  07/13/2023 Name: Norman Rodriguez MRN: [id number] DOB: 1955/05/01   Complex Care Management Outreach Attempts: A second unsuccessful outreach was attempted today to offer the patient with information about available complex care management services.  Follow Up Plan:  Additional outreach attempts will be made to offer the patient complex care management information and services.   Encounter Outcome:  No Answer  Kandis Ormond, CMA [place]  [facility], [facility] Guide Direct Dial: [phone]  Fax: [phone] Website: [email or web address]

## 2023-07-14 ENCOUNTER — Ambulatory Visit: Admitting: Cardiology

## 2023-07-14 ENCOUNTER — Ambulatory Visit: Payer: Self-pay | Admitting: Family Medicine

## 2023-07-14 NOTE — Progress Notes (Signed)
 Complex Care Management Note Care Guide Note  07/14/2023 Name: Norman Rodriguez MRN: [id number] DOB: 09-09-1955   Complex Care Management Outreach Attempts: A third unsuccessful outreach was attempted today to offer the patient with information about available complex care management services.  Follow Up Plan:  No further outreach attempts will be made at this time. We have been unable to contact the patient to offer or enroll patient in complex care management services.  Encounter Outcome:  No Answer  Kandis Ormond, CMA [place]  [facility], [facility] Guide Direct Dial: [phone]  Fax: [phone] Website: [email or web address]

## 2023-07-25 ENCOUNTER — Ambulatory Visit: Admitting: Cardiology

## 2023-07-25 ENCOUNTER — Inpatient Hospital Stay (HOSPITAL_COMMUNITY)
Admission: EM | Admit: 2023-07-25 | Discharge: 2023-07-30 | DRG: 291 | Disposition: A | Discharge status: Home / Self Care | Attending: Internal Medicine | Admitting: Internal Medicine

## 2023-07-25 ENCOUNTER — Ambulatory Visit: Attending: Cardiology | Admitting: Cardiology

## 2023-07-25 ENCOUNTER — Other Ambulatory Visit: Payer: Self-pay

## 2023-07-25 ENCOUNTER — Encounter: Payer: Self-pay | Admitting: Cardiology

## 2023-07-25 ENCOUNTER — Emergency Department (HOSPITAL_COMMUNITY)

## 2023-07-25 VITALS — BP 138/83 | HR 65 | Resp 16 | Ht 67.0 in | Wt 260.0 lb

## 2023-07-25 DIAGNOSIS — I82511 Chronic embolism and thrombosis of right femoral vein: Secondary | ICD-10-CM | POA: Diagnosis present

## 2023-07-25 DIAGNOSIS — I10 Essential (primary) hypertension: Secondary | ICD-10-CM | POA: Diagnosis present

## 2023-07-25 DIAGNOSIS — Z7984 Long term (current) use of oral hypoglycemic drugs: Secondary | ICD-10-CM

## 2023-07-25 DIAGNOSIS — I5022 Chronic systolic (congestive) heart failure: Secondary | ICD-10-CM

## 2023-07-25 DIAGNOSIS — Z955 Presence of coronary angioplasty implant and graft: Secondary | ICD-10-CM | POA: Diagnosis not present

## 2023-07-25 DIAGNOSIS — I2510 Atherosclerotic heart disease of native coronary artery without angina pectoris: Secondary | ICD-10-CM

## 2023-07-25 DIAGNOSIS — I480 Paroxysmal atrial fibrillation: Secondary | ICD-10-CM | POA: Diagnosis present

## 2023-07-25 DIAGNOSIS — E785 Hyperlipidemia, unspecified: Secondary | ICD-10-CM | POA: Diagnosis present

## 2023-07-25 DIAGNOSIS — R0989 Other specified symptoms and signs involving the circulatory and respiratory systems: Secondary | ICD-10-CM | POA: Diagnosis not present

## 2023-07-25 DIAGNOSIS — Z87891 Personal history of nicotine dependence: Secondary | ICD-10-CM

## 2023-07-25 DIAGNOSIS — I509 Heart failure, unspecified: Principal | ICD-10-CM

## 2023-07-25 DIAGNOSIS — I517 Cardiomegaly: Secondary | ICD-10-CM | POA: Diagnosis not present

## 2023-07-25 DIAGNOSIS — I4819 Other persistent atrial fibrillation: Secondary | ICD-10-CM | POA: Diagnosis not present

## 2023-07-25 DIAGNOSIS — Z7901 Long term (current) use of anticoagulants: Secondary | ICD-10-CM | POA: Diagnosis not present

## 2023-07-25 DIAGNOSIS — E66813 Obesity, class 3: Secondary | ICD-10-CM | POA: Diagnosis present

## 2023-07-25 DIAGNOSIS — Z95810 Presence of automatic (implantable) cardiac defibrillator: Secondary | ICD-10-CM | POA: Diagnosis not present

## 2023-07-25 DIAGNOSIS — Z6841 Body Mass Index (BMI) 40.0 and over, adult: Secondary | ICD-10-CM | POA: Diagnosis not present

## 2023-07-25 DIAGNOSIS — J449 Chronic obstructive pulmonary disease, unspecified: Secondary | ICD-10-CM | POA: Diagnosis present

## 2023-07-25 DIAGNOSIS — I4821 Permanent atrial fibrillation: Secondary | ICD-10-CM | POA: Diagnosis present

## 2023-07-25 DIAGNOSIS — I252 Old myocardial infarction: Secondary | ICD-10-CM | POA: Diagnosis not present

## 2023-07-25 DIAGNOSIS — I255 Ischemic cardiomyopathy: Secondary | ICD-10-CM

## 2023-07-25 DIAGNOSIS — I4729 Other ventricular tachycardia: Secondary | ICD-10-CM | POA: Diagnosis present

## 2023-07-25 DIAGNOSIS — R918 Other nonspecific abnormal finding of lung field: Secondary | ICD-10-CM | POA: Diagnosis not present

## 2023-07-25 DIAGNOSIS — Z79899 Other long term (current) drug therapy: Secondary | ICD-10-CM

## 2023-07-25 DIAGNOSIS — Z888 Allergy status to other drugs, medicaments and biological substances status: Secondary | ICD-10-CM

## 2023-07-25 DIAGNOSIS — I5023 Acute on chronic systolic (congestive) heart failure: Secondary | ICD-10-CM | POA: Diagnosis present

## 2023-07-25 DIAGNOSIS — R609 Edema, unspecified: Secondary | ICD-10-CM | POA: Diagnosis not present

## 2023-07-25 DIAGNOSIS — E876 Hypokalemia: Secondary | ICD-10-CM | POA: Diagnosis not present

## 2023-07-25 DIAGNOSIS — Z743 Need for continuous supervision: Secondary | ICD-10-CM | POA: Diagnosis not present

## 2023-07-25 DIAGNOSIS — M19111 Post-traumatic osteoarthritis, right shoulder: Secondary | ICD-10-CM | POA: Diagnosis not present

## 2023-07-25 DIAGNOSIS — I110 Hypertensive heart disease with heart failure: Principal | ICD-10-CM | POA: Diagnosis present

## 2023-07-25 DIAGNOSIS — R0602 Shortness of breath: Secondary | ICD-10-CM | POA: Diagnosis not present

## 2023-07-25 DIAGNOSIS — I5021 Acute systolic (congestive) heart failure: Secondary | ICD-10-CM | POA: Diagnosis not present

## 2023-07-25 DIAGNOSIS — I472 Ventricular tachycardia, unspecified: Secondary | ICD-10-CM | POA: Diagnosis not present

## 2023-07-25 LAB — COMPREHENSIVE METABOLIC PANEL WITH GFR
ALT: 26 U/L (ref 0–44)
AST: 27 U/L (ref 15–41)
Albumin: 3.3 g/dL — ABNORMAL LOW (ref 3.5–5.0)
Alkaline Phosphatase: 57 U/L (ref 38–126)
Anion gap: 13 (ref 5–15)
BUN: 11 mg/dL (ref 8–23)
CO2: 26 mmol/L (ref 22–32)
Calcium: 8.8 mg/dL — ABNORMAL LOW (ref 8.9–10.3)
Chloride: 100 mmol/L (ref 98–111)
Creatinine, Ser: 1.06 mg/dL (ref 0.61–1.24)
GFR, Estimated: >60 mL/min (ref >60)
Glucose, Bld: 128 mg/dL — ABNORMAL HIGH (ref 70–99)
Potassium: 3.8 mmol/L (ref 3.5–5.1)
Sodium: 139 mmol/L (ref 135–145)
Total Bilirubin: 3.1 mg/dL — ABNORMAL HIGH (ref 0.0–1.2)
Total Protein: 6.6 g/dL (ref 6.5–8.1)

## 2023-07-25 LAB — CBC
HCT: 39.9 % (ref 39.0–52.0)
Hemoglobin: 13.7 g/dL (ref 13.0–17.0)
MCH: 31.7 pg (ref 26.0–34.0)
MCHC: 34.3 g/dL (ref 30.0–36.0)
MCV: 92.4 fL (ref 80.0–100.0)
Platelets: 181 10*3/uL (ref 150–400)
RBC: 4.32 MIL/uL (ref 4.22–5.81)
RDW: 15.5 % (ref 11.5–15.5)
WBC: 5.2 10*3/uL (ref 4.0–10.5)
nRBC: 0 % (ref 0.0–0.2)

## 2023-07-25 LAB — DIGOXIN LEVEL: Digoxin Level: 0.8 ng/mL (ref 0.8–2.0)

## 2023-07-25 LAB — MAGNESIUM: Magnesium: 1.9 mg/dL (ref 1.7–2.4)

## 2023-07-25 MED ORDER — FUROSEMIDE 10 MG/ML IJ SOLN
40.0000 mg | Freq: Two times a day (BID) | INTRAMUSCULAR | Status: DC
  Administered 2023-07-26: 40 mg via INTRAVENOUS
  Filled 2023-07-25: qty 4

## 2023-07-25 MED ORDER — FUROSEMIDE 10 MG/ML IJ SOLN
80.0000 mg | Freq: Once | INTRAMUSCULAR | Status: AC
  Administered 2023-07-25: 80 mg via INTRAVENOUS
  Filled 2023-07-25: qty 8

## 2023-07-25 MED ORDER — SODIUM CHLORIDE 0.9% FLUSH: 3.0000 mL | INTRAVENOUS | Status: DC | PRN

## 2023-07-25 MED ORDER — SODIUM CHLORIDE 0.9% FLUSH
3.0000 mL | Freq: Two times a day (BID) | INTRAVENOUS | Status: DC
Start: 2023-07-25 — End: 2023-07-30
  Administered 2023-07-25 – 2023-07-29 (×8): 3 mL via INTRAVENOUS

## 2023-07-25 MED ORDER — ACETAMINOPHEN 325 MG PO TABS
650.0000 mg | ORAL_TABLET | ORAL | Status: DC | PRN
  Administered 2023-07-26: 650 mg via ORAL
  Filled 2023-07-25: qty 2

## 2023-07-25 MED ORDER — SODIUM CHLORIDE 0.9 % IV SOLN: 250.0000 mL | INTRAVENOUS | Status: AC | PRN

## 2023-07-25 MED ORDER — ONDANSETRON HCL 4 MG/2ML IJ SOLN: 4.0000 mg | Freq: Four times a day (QID) | INTRAMUSCULAR | Status: DC | PRN

## 2023-07-25 MED ORDER — NITROGLYCERIN 0.4 MG SL SUBL: 0.4000 mg | SUBLINGUAL_TABLET | SUBLINGUAL | 4 refills | Status: AC | PRN

## 2023-07-25 NOTE — ED Provider Notes (Signed)
 [place] EMERGENCY DEPARTMENT AT [facility] Provider Note   CSN: [id number] Arrival date & time: 07/25/23  1638     Patient presents with: Shortness of Breath and Leg Swelling   Norman Rodriguez is a 68 y.o. male.    Shortness of Breath Patient sent from cardiology office for admission.  Shortness of breath with volume overload.  History of CHF.  Pitting edema.  Sent in for IV Lasix .  Patient states he has been more short of breath and unable to lay flat.     Prior to Admission medications   Medication Sig Start Date End Date Taking? Authorizing Provider  acetaminophen  (GNP PAIN RELIEF EX-STRENGTH) 500 MG tablet TAKE 1-2 TABLETS BY MOUTH THREE (THREE) TIMES DAILY AS NEEDED. 03/10/23   [name], MD  allopurinol  (ZYLOPRIM ) 100 MG tablet TAKE TWO TABLETS (200 MG TOTAL) BY MOUTH DAILY 06/01/23   Rice, Lonni ORN, MD  amiodarone  (PACERONE ) 200 MG tablet Take 1 tablet (200 mg total) by mouth daily. 400 MG TWICE A DAY 2 WEEKS 200 MG TWICE A DAY 2 WEEKS THEN DAILY 01/11/23   Leverne Charlies Helling, PA-C  atorvastatin  (LIPITOR ) 40 MG tablet TAKE ONE TABLET BY MOUTH DAILY 04/01/23   Jeffrie Oneil BROCKS, MD  carvedilol  (COREG ) 25 MG tablet TAKE ONE TABLET BY MOUTH TWICE A DAY 08/03/22   Jeffrie Oneil BROCKS, MD  Coenzyme Q10 (CO Q-10) 200 MG CAPS Take 200 mg by mouth daily.    [provider]  Colchicine  0.6 MG CAPS Day 1: Take 1 caps. If still having swelling, may take 1 cap the following day. Patient not taking: Reported on 07/25/2023 11/05/22   [name], MD  digoxin  (LANOXIN ) 0.125 MG tablet TAKE ONE TABLET BY MOUTH DAILY 04/18/23   Jeffrie Oneil BROCKS, MD  fexofenadine (ALLEGRA) 180 MG tablet Take 180 mg by mouth daily.    [provider]  furosemide  (LASIX ) 40 MG tablet Take 1 tablet (40 mg total) by mouth 2 (two) times daily. 01/13/23   Ursuy, Renee Lynn, PA-C  gabapentin  (NEURONTIN ) 300 MG capsule Take 300 mg by mouth as needed.    [provider]   LevOCARNitine (CARNITINE PO) Take 500 mg by mouth daily. Patient not taking: Reported on 07/25/2023    [provider]  Multiple Vitamin (MULTIVITAMIN) tablet Take 1 tablet by mouth daily.    [provider]  nitroGLYCERIN  (NITROSTAT ) 0.4 MG SL tablet Place 1 tablet (0.4 mg total) under the tongue every 5 (five) minutes as needed for chest pain. 07/25/23   Jeffrie Oneil BROCKS, MD  NON FORMULARY Take 465 mg by mouth as needed (joint pain). HiActives Tart Cherry    [provider]  Omega-3 Fatty Acids (OMEGA 3 PO) Take 1 capsule by mouth daily.    [provider]  OVER THE COUNTER MEDICATION Take 1 capsule by mouth daily at 6 (six) AM. INSTAFLEX SUPPLEMENT for joint pain.    [provider]  potassium chloride  SA (KLOR-CON  M) 20 MEQ tablet Take 1 tablet (20 mEq total) by mouth as directed. Take 1 tablet daily for 4 days then hold others for later use as instructed Patient not taking: Reported on 07/25/2023 10/14/22   Jeffrie Oneil BROCKS, MD  rivaroxaban  (XARELTO ) 20 MG TABS tablet TAKE ONE TABLET BY MOUTH DAILY WITH SUPPER 06/01/23   Jeffrie Oneil BROCKS, MD  sacubitril -valsartan  (ENTRESTO ) 24-26 MG Take 1 tablet by mouth 2 (two) times daily. 07/01/23   Jeffrie Oneil BROCKS,  MD  vitamin C  (ASCORBIC ACID ) 500 MG tablet Take 500 mg by mouth daily.    [provider]  VITAMIN D, CHOLECALCIFEROL , PO Take 5,000 Units by mouth daily.    [provider]    Allergies: Amiodarone , Apple juice, Soybean oil, Flaxseed (linseed), Lisinopril, Other, Bean pod extract, Carrot [daucus carota], Grass extracts [gramineae pollens], Peach flavoring agent (non-screening), and Tree extract    Review of Systems  Respiratory:  Positive for shortness of breath.     Updated Vital Signs BP (!) 152/83 (BP Location: Left Arm)   Pulse (!) 55   Temp 98.3 F (36.8 C)   Resp 18   Ht 5' 7 (1.702 m)   Wt 117.9 kg   SpO2 99%   BMI 40.72 kg/m   Physical Exam Vitals and nursing note  reviewed.  Constitutional:      Appearance: He is obese.  Pulmonary:     Breath sounds: Rales present.   Musculoskeletal:     Right lower leg: Edema present.     Left lower leg: Edema present.   Skin:    Capillary Refill: Capillary refill takes less than 2 seconds.   Neurological:     Mental Status: He is alert.     (all labs ordered are listed, but only abnormal results are displayed) Labs Reviewed  COMPREHENSIVE METABOLIC PANEL WITH GFR - Abnormal; Notable for the following components:      Result Value   Glucose, Bld 128 (*)    Calcium  8.8 (*)    Albumin 3.3 (*)    Total Bilirubin 3.1 (*)    All other components within normal limits  CBC  MAGNESIUM   DIGOXIN  LEVEL    EKG: None  Radiology: DG Chest Portable 1 View Result Date: 07/25/2023 CLINICAL DATA:  Shortness of breath. EXAM: PORTABLE CHEST 1 VIEW COMPARISON:  01/07/2021 FINDINGS: Left-sided pacemaker in place. Mild cardiomegaly is similar. Diffuse vascular congestion. Ill-defined opacity at the left lung base. No pneumothorax. No large pleural effusion, suboptimally assessed due to soft tissue attenuation. IMPRESSION: 1. Mild cardiomegaly with vascular congestion. 2. Ill-defined opacity at the left lung base may represent atelectasis/airspace disease or soft tissue attenuation from habitus. Electronically Signed   By: Andrea Gasman M.D.   On: 07/25/2023 17:41     Procedures   Medications Ordered in the ED  furosemide  (LASIX ) injection 80 mg (has no administration in time range)                                    Medical Decision Making Amount and/or Complexity of Data Reviewed Labs: ordered. Radiology: ordered.  Risk Prescription drug management.   Patient pitting edema bilateral lower extremities.  Shortness of breath.  Appears volume overload.  Sent in for admission.  Reviewed cardiology note.  Blood work overall reassuring.  X-ray does show some volume overload.  Will call hospitalist for  admission        Final diagnoses:  Acute on chronic congestive heart failure, unspecified heart failure type [facility])    ED Discharge Orders     None          Patsey Lot, MD 07/25/23 [phone]

## 2023-07-25 NOTE — Progress Notes (Signed)
 Cardiology Office Note:  .   Date:  07/25/2023  ID:  Norman Rodriguez, DOB 07-Aug-1955, MRN [id number] PCP: [name], MD  [facility] Providers Cardiologist:  Oneil Parchment, MD Electrophysiologist:  Will Gladis Norton, MD     History of Present Illness: .   Norman Rodriguez is a 68 y.o. male Discussed the use of AI scribe software for clinical note transcription with the patient, who gave verbal consent to proceed.  Chief complaint: Shortness of breath, orthopnea History of Present Illness Norman Rodriguez is a 68 year old male with atrial fibrillation, coronary artery disease, chronic systolic heart failure, and COPD who presents with worsening shortness of breath and fluid retention.  He experiences worsening shortness of breath, particularly at night, waking up around 2:30 to 3:00 AM gasping for breath. He describes his chest as feeling 'tight as can be.' This is a significant change from his previous condition, even compared to when he had a heart attack. No current chest pain, but he has gasping for breath and tightness in the chest. No fever or other systemic symptoms.  He has a history of atrial fibrillation, coronary artery disease, chronic systolic heart failure, and COPD. He has had two episodes of ventricular tachycardia treated with ATP therapy. His CHADS-VASc score is four, and he is on Xarelto  for anticoagulation. He was previously on mexiletine but discontinued it due to adverse effects. He is currently on amiodarone , which previously caused blue skin discoloration, but not this time, he denies blue skin discoloration. He is also on carvedilol  25 mg, digoxin , and Entresto  24/26 twice a day.  He is experiencing fluid retention and a recent episode of brown urine, which he associates with kidney stress. Despite being advised to hydrate, he feels conflicted due to his fluid retention issues. He is currently taking furosemide  40 mg, which he took a full dose of today  to help with his symptoms.  He mentions a recent blood draw on the 16th of the month and has been feeling significantly worse since then. He expresses concern about his ability to manage daily activities, such as showering and getting ready for appointments, due to his symptoms. He has prepared for the possibility of hospitalization by bringing necessary items with him to the appointment.      ROS: No syncope.  Positive orthopnea, PND  Studies Reviewed: SABRA   EKG Interpretation Date/Time:  Monday July 25 2023 15:04:48 EDT Ventricular Rate:  61 PR Interval:    QRS Duration:  104 QT Interval:  472 QTC Calculation: 475 R Axis:   40  Text Interpretation: Atrial fibrillation ST & T wave abnormality, consider inferior ischemia Prolonged QT When compared with ECG of 11-Jan-2023 13:30, Nonspecific T wave abnormality has replaced inverted T waves in Lateral leads QT has lengthened Confirmed by Parchment Oneil ([id number]) on 07/25/2023 3:28:58 PM    Results LABS Creatinine: 1.09 (07/11/2023)  DIAGNOSTIC EKG: Atrial fibrillation with heart rate in the 70s to 80s (07/25/2023) Risk Assessment/Calculations:    CHA2DS2-VASc Score = 4   This indicates a 4.8% annual risk of stroke. The patient's score is based upon: CHF History: 1 HTN History: 1 Diabetes History: 0 Stroke History: 0 Vascular Disease History: 1 Age Score: 1 Gender Score: 0            Physical Exam:   VS:  BP 138/83 (BP Location: Left Arm, Patient Position: Sitting, Cuff Size: Large)   Pulse 65   Resp 16   Ht  5' 7 (1.702 m)   Wt 260 lb (117.9 kg)   SpO2 90%   BMI 40.72 kg/m    Wt Readings from Last 3 Encounters:  07/25/23 260 lb (117.9 kg)  07/25/23 260 lb (117.9 kg)  07/11/23 249 lb 9.6 oz (113.2 kg)    GEN: Short of breath NECK: No JVD; No carotid bruits CARDIAC: Irregularly irregular normal rate, no murmurs, no rubs, no gallops, ICD RESPIRATORY: Crackles at bases ABDOMEN: Soft, non-tender,  non-distended EXTREMITIES: Marked lower extremity, 4+ edema; No deformity   ASSESSMENT AND PLAN: .    Assessment and Plan Assessment & Plan Chronic systolic heart failure, acute episode/ICD Chronic systolic heart failure with symptoms of fluid overload, including orthopnea and chest fullness, worsening since June 16. Current medication includes furosemide  40 mg daily.  Creatinine is stable with creatinine at 1.09. -Marked edema in lower abdomen as well as lower extremities -We will go ahead and bring to the emergency department for further evaluation and admission.  He will need IV furosemide  80 mg twice daily.  Likely several days of diuresis.  Monitor potassium daily, creatinine daily. -We will coordinate EMS transportation.  Permanent atrial fibrillation Permanent atrial fibrillation with heart rate in the 70s to 80s on EKG. Managed with amiodarone , carvedilol , and digoxin .  Okay to continue with current dosing.  CHADS-VASc score is four, indicating anticoagulation necessity. - Continue amiodarone , carvedilol , and digoxin . - Continue rivaroxaban  for anticoagulation.  Coronary artery disease Cardiac catheterization on 01/05/2021 following: Diagnostic Dominance: Right   Coronary artery disease with no current angina. History of acute coronary syndrome and non-ST elevated myocardial infarction. Managed with atorvastatin  and nitroglycerin  as needed. - Continue atorvastatin  for lipid management. - Use nitroglycerin  as needed for angina.  Primary hypertension Primary hypertension managed with carvedilol  and sacubitril -valsartan . Blood pressure control is essential for managing heart failure and coronary artery disease. - Continue carvedilol  and sacubitril -valsartan  for blood pressure/heart failure management.  Ventricular tachycardia Ventricular tachycardia with two episodes treated with ATP therapy. Amiodarone  is effective in preventing further episodes. - Continue amiodarone  to  prevent ventricular tachycardia episodes.  Did not tolerate mexiletine.  Chronic obstructive pulmonary disease (COPD) COPD managed with Anoro Ellipta  inhalation daily. Symptoms of dyspnea may overlap with heart failure symptoms. - Continue Anoro Ellipta  inhalation daily.         Dispo: post hosp  Signed, Oneil Parchment, MD

## 2023-07-25 NOTE — ED Notes (Signed)
 Called CCMD to add to cardiac monitoring

## 2023-07-25 NOTE — ED Notes (Signed)
 Patient given 2 urinals post IV Lasix  administration, states no other needs at this time.

## 2023-07-25 NOTE — H&P (Signed)
 History and Physical    Patient: Norman Rodriguez FMW:[id number] DOB: [birth year] DOA: 07/25/2023 DOS: the patient was seen and examined on 07/25/2023 PCP: [name], MD  Patient coming from: Home  Chief Complaint:  Chief Complaint  Patient presents with   Shortness of Breath   Leg Swelling   HPI: Norman Rodriguez is a 68 y.o. male with medical history significant of diastolic heart failure, atrial fibrillation, acute coronary artery disease, chronic systolic heart failure with previous EF of 30 to 35%, COPD, atrial fibrillation, hyperlipidemia, non-ST elevation MI, dysrhythmia who was sent over by Dr. Jeffrie his cardiologist secondary to fluid overload.  Patient appears to have CHF exacerbation.  Patient has 2+ pedal edema all the way to the thigh.  Also exertional dyspnea, PND and orthopnea.  Review of Systems: As mentioned in the history of present illness. All other systems reviewed and are negative. Past Medical History:  Diagnosis Date   ACS (acute coronary syndrome) (HCC)    Arthritis    Atrial fibrillation (HCC)    a. Dx 2010. b. Initiated on Tikosyn  03/2013.   BRBPR (bright red blood per rectum) 10/22/2017   CAD (coronary artery disease)    a. BMS 2.71mm 23 to LAD. CTO of RCA unsuccessful, good left to right collaterals - 2010.   Chronic anticoagulation    Chronic systolic CHF (congestive heart failure) (HCC)    a. Both ischemic and tachy induced. b. Echo 04/2013: EF 30-35%, no RWMA, normal RV, mod dilated LA (before restoration of NSR).   COPD (chronic obstructive pulmonary disease) (HCC) 10/22/2017   Dysrhythmia    hx of atrial fibrilation   HTN (hypertension)    Hyperlipidemia    Melena    NSTEMI (non-ST elevated myocardial infarction) (HCC) 01/05/2021   NSVT (nonsustained ventricular tachycardia) (HCC)    Obesity    Polyp of cecum    Polyp of descending colon    Polyp of rectum    Polyp of transverse colon    Shortness of breath    Past Surgical History:   Procedure Laterality Date   CARDIAC DEFIBRILLATOR PLACEMENT  2024   CARDIOVERSION N/A 04/27/2013   Procedure: BEDSIDE CARDIOVERSION;  Surgeon: Jerel Balding, MD;  Location: [facility] OR;  Service: Cardiovascular;  Laterality: N/A;   CARDIOVERSION N/A 05/15/2013   Procedure: CARDIOVERSION/BEDSIDE;  Surgeon: Ezra GORMAN Shuck, MD;  Location: [facility] OR;  Service: Cardiovascular;  Laterality: N/A;   CARDIOVERSION N/A 05/16/2014   Procedure: CARDIOVERSION;  Surgeon: Oneil JAYSON Jeffrie, MD;  Location: [facility] ENDOSCOPY;  Service: Cardiovascular;  Laterality: N/A;   CARDIOVERSION N/A 06/07/2014   Procedure: CARDIOVERSION;  Surgeon: Oneil JAYSON Jeffrie, MD;  Location: [facility] ENDOSCOPY;  Service: Cardiovascular;  Laterality: N/A;   COLONOSCOPY WITH PROPOFOL  N/A 10/27/2017   Procedure: COLONOSCOPY WITH PROPOFOL ;  Surgeon: Shila Gustav GAILS, MD;  Location: [facility] ENDOSCOPY;  Service: Endoscopy;  Laterality: N/A;   CORONARY ANGIOPLASTY WITH STENT PLACEMENT  01/26/2008   BMS 2.30mm 23 to LAD. CTO of RCA unsuccessful, good left to right collaterals   ESOPHAGOGASTRODUODENOSCOPY (EGD) WITH PROPOFOL  N/A 10/24/2017   Procedure: ESOPHAGOGASTRODUODENOSCOPY (EGD) WITH PROPOFOL ;  Surgeon: Shila Gustav GAILS, MD;  Location: [facility] ENDOSCOPY;  Service: Endoscopy;  Laterality: N/A;   FOOT SURGERY Right    ICD IMPLANT N/A 01/06/2021   Procedure: ICD IMPLANT;  Surgeon: Inocencio Soyla Lunger, MD;  Location: [facility] INVASIVE CV LAB;  Service: Cardiovascular;  Laterality: N/A;   LEFT HEART CATH AND CORONARY ANGIOGRAPHY N/A 01/05/2021   Procedure: LEFT HEART  CATH AND CORONARY ANGIOGRAPHY;  Surgeon: Court Dorn PARAS, MD;  Location: [facility] INVASIVE CV LAB;  Service: Cardiovascular;  Laterality: N/A;   POLYPECTOMY  10/27/2017   Procedure: POLYPECTOMY;  Surgeon: Shila Gustav GAILS, MD;  Location: [facility] ENDOSCOPY;  Service: Endoscopy;;   Social History:  reports that he has quit smoking. His smoking use included cigars. He has never been exposed to tobacco smoke. He has  never used smokeless tobacco. He reports current alcohol use of about 3.0 standard drinks of alcohol per week. He reports that he does not use drugs.  Allergies  Allergen Reactions   Amiodarone  Anaphylaxis    turns blue   Apple Juice Hives   Soybean Oil Other (See Comments)    unknown   Flaxseed (Linseed)    Lisinopril Cough   Other Itching    Pt has itching and joint swelling with walnuts, mildew, fungi, basil and peaches   Bean Pod Extract Rash    Green Beans specifically Joint swelling and itching   Carrot [Daucus Carota] Rash    Joint swelling and itching   Grass Extracts [Gramineae Pollens] Rash    Joint swelling and itching   Peach Flavoring Agent (Non-Screening) Rash    Joint swelling and itching   Tree Extract Rash    Joint swelling and itching    Family History  Problem Relation Age of Onset   Other Father        Car accident   Coronary artery disease Neg Hx     Prior to Admission medications   Medication Sig Start Date End Date Taking? Authorizing Provider  acetaminophen  (GNP PAIN RELIEF EX-STRENGTH) 500 MG tablet TAKE 1-2 TABLETS BY MOUTH THREE (THREE) TIMES DAILY AS NEEDED. 03/10/23   [name], MD  allopurinol  (ZYLOPRIM ) 100 MG tablet TAKE TWO TABLETS (200 MG TOTAL) BY MOUTH DAILY 06/01/23   Rice, Lonni ORN, MD  amiodarone  (PACERONE ) 200 MG tablet Take 1 tablet (200 mg total) by mouth daily. 400 MG TWICE A DAY 2 WEEKS 200 MG TWICE A DAY 2 WEEKS THEN DAILY 01/11/23   Leverne Charlies Helling, PA-C  atorvastatin  (LIPITOR ) 40 MG tablet TAKE ONE TABLET BY MOUTH DAILY 04/01/23   Jeffrie Oneil BROCKS, MD  carvedilol  (COREG ) 25 MG tablet TAKE ONE TABLET BY MOUTH TWICE A DAY 08/03/22   Jeffrie Oneil BROCKS, MD  Coenzyme Q10 (CO Q-10) 200 MG CAPS Take 200 mg by mouth daily.    [provider]  Colchicine  0.6 MG CAPS Day 1: Take 1 caps. If still having swelling, may take 1 cap the following day. Patient not taking: Reported on 07/25/2023 11/05/22   [name], MD   digoxin  (LANOXIN ) 0.125 MG tablet TAKE ONE TABLET BY MOUTH DAILY 04/18/23   Jeffrie Oneil BROCKS, MD  fexofenadine (ALLEGRA) 180 MG tablet Take 180 mg by mouth daily.    [provider]  furosemide  (LASIX ) 40 MG tablet Take 1 tablet (40 mg total) by mouth 2 (two) times daily. 01/13/23   Ursuy, Renee Lynn, PA-C  gabapentin  (NEURONTIN ) 300 MG capsule Take 300 mg by mouth as needed.    [provider]  LevOCARNitine (CARNITINE PO) Take 500 mg by mouth daily. Patient not taking: Reported on 07/25/2023    [provider]  Multiple Vitamin (MULTIVITAMIN) tablet Take 1 tablet by mouth daily.    [provider]  nitroGLYCERIN  (NITROSTAT ) 0.4 MG SL tablet Place 1 tablet (0.4 mg total) under the tongue every 5 (five) minutes as needed for  chest pain. 07/25/23   Jeffrie Oneil BROCKS, MD  NON FORMULARY Take 465 mg by mouth as needed (joint pain). HiActives Tart Cherry    [provider]  Omega-3 Fatty Acids (OMEGA 3 PO) Take 1 capsule by mouth daily.    [provider]  OVER THE COUNTER MEDICATION Take 1 capsule by mouth daily at 6 (six) AM. INSTAFLEX SUPPLEMENT for joint pain.    [provider]  potassium chloride  SA (KLOR-CON  M) 20 MEQ tablet Take 1 tablet (20 mEq total) by mouth as directed. Take 1 tablet daily for 4 days then hold others for later use as instructed Patient not taking: Reported on 07/25/2023 10/14/22   Jeffrie Oneil BROCKS, MD  rivaroxaban  (XARELTO ) 20 MG TABS tablet TAKE ONE TABLET BY MOUTH DAILY WITH SUPPER 06/01/23   Jeffrie Oneil BROCKS, MD  sacubitril -valsartan  (ENTRESTO ) 24-26 MG Take 1 tablet by mouth 2 (two) times daily. 07/01/23   Jeffrie Oneil BROCKS, MD  vitamin C  (ASCORBIC ACID ) 500 MG tablet Take 500 mg by mouth daily.    [provider]  VITAMIN D, CHOLECALCIFEROL , PO Take 5,000 Units by mouth daily.    [provider]    Physical Exam: Vitals:   07/25/23 1659 07/25/23 1700 07/25/23 1845 07/25/23 1915  BP:  (!) 152/83 (!) 147/66  (!) 144/67  Pulse:  (!) 55 (!) 54 (!) 54  Resp:  18 (!) 22 20  Temp:  98.3 F (36.8 C)  98.4 F (36.9 C)  TempSrc:    Oral  SpO2:  99% 97% 96%  Weight: 117.9 kg     Height: 5' 7 (1.702 m)      Constitutional: NAD, calm, comfortable Eyes: PERRL, lids and conjunctivae normal ENMT: Mucous membranes are moist. Posterior pharynx clear of any exudate or lesions.Normal dentition.  Neck: normal, supple, no masses, no thyromegaly Respiratory: clear to auscultation bilaterally, no wheezing, no crackles. Normal respiratory effort. No accessory muscle use.  Cardiovascular: Sinus bradycardia, no murmurs / rubs / gallops.  2+ extremity edema. 2+ pedal pulses. No carotid bruits.  Abdomen: no tenderness, no masses palpated. No hepatosplenomegaly. Bowel sounds positive.  Musculoskeletal: Good range of motion, no joint swelling or tenderness, Skin: no rashes, lesions, ulcers. No induration Neurologic: CN 2-12 grossly intact. Sensation intact, DTR normal. Strength 5/5 in all 4.  Psychiatric: Normal judgment and insight. Alert and oriented x 3. Normal mood  Data Reviewed:  Blood pressure 1 5466, pulse 47, glucose 128 calcium  8.8.  CBC within normal.  Chest x-ray showed mild cardiomegaly with vascular congestion, ill-defined opacity of the left lung base may be atelectasis or airspace disease.  Assessment and Plan:  #1 acute exacerbation of systolic CHF: Patient has previously known systolic dysfunction.  Will admit the patient.  Aggressive diuresis, continue other cardiac medications.  Dr. Barkley, cardiology to follow in the morning.  Get echocardiogram.  #2 coronary artery disease: Stable at baseline.  Continue to monitor.  #3 paroxysmal atrial fibrillation: On Xarelto .  Also beta-blockers.  Will continue  #4 essential hypertension: Continue home blood pressure medications.  #5 COPD: No acute exacerbation.  #6 history of DVT: On chronic anticoagulation with Xarelto   #7 hyperlipidemia: Continue  statin.    Advance Care Planning:   Code Status: Full Code   Consults: Cardiology in the morning  Family Communication: No family at bedside  Severity of Illness: The appropriate patient status for this patient is INPATIENT. Inpatient status is judged to be reasonable and necessary in order to provide  the required intensity of service to ensure the patient's safety. The patient's presenting symptoms, physical exam findings, and initial radiographic and laboratory data in the context of their chronic comorbidities is felt to place them at high risk for further clinical deterioration. Furthermore, it is not anticipated that the patient will be medically stable for discharge from the hospital within 2 midnights of admission.   * I certify that at the point of admission it is my clinical judgment that the patient will require inpatient hospital care spanning beyond 2 midnights from the point of admission due to high intensity of service, high risk for further deterioration and high frequency of surveillance required.*  AuthorBETHA SIM KNOLL, MD 07/25/2023 7:36 PM  For on call review [email or web address].

## 2023-07-25 NOTE — Patient Instructions (Signed)
 You are being sent to [facility] ED for further treatment of CHF.  *If you need a refill on your cardiac medications before your next appointment, please call your pharmacy*  Follow-Up: At [facility], you and your health needs are our priority.  As part of our continuing mission to provide you with exceptional heart care, our providers are all part of one team.  This team includes your primary Cardiologist (physician) and Advanced Practice Providers or APPs (Physician Assistants and Nurse Practitioners) who all work together to provide you with the care you need, when you need it.  Your next appointment:   Follow up post hospital as ordered.

## 2023-07-25 NOTE — ED Triage Notes (Signed)
 Coming from cardiologist's office via EMS c/o SOB, edema in B/L LE- 1+ pitting edema. SOB worsens when lying. +rales, decreased in lower lobes.   182/60 HR60- afib @ baseline, 94%RA

## 2023-07-26 ENCOUNTER — Encounter (HOSPITAL_COMMUNITY): Payer: Self-pay | Admitting: Internal Medicine

## 2023-07-26 ENCOUNTER — Inpatient Hospital Stay (HOSPITAL_COMMUNITY)

## 2023-07-26 DIAGNOSIS — I82511 Chronic embolism and thrombosis of right femoral vein: Secondary | ICD-10-CM

## 2023-07-26 DIAGNOSIS — I480 Paroxysmal atrial fibrillation: Secondary | ICD-10-CM | POA: Diagnosis not present

## 2023-07-26 DIAGNOSIS — I4821 Permanent atrial fibrillation: Secondary | ICD-10-CM

## 2023-07-26 DIAGNOSIS — I5023 Acute on chronic systolic (congestive) heart failure: Secondary | ICD-10-CM | POA: Diagnosis not present

## 2023-07-26 DIAGNOSIS — I472 Ventricular tachycardia, unspecified: Secondary | ICD-10-CM

## 2023-07-26 DIAGNOSIS — E66813 Obesity, class 3: Secondary | ICD-10-CM

## 2023-07-26 DIAGNOSIS — I5021 Acute systolic (congestive) heart failure: Secondary | ICD-10-CM

## 2023-07-26 DIAGNOSIS — I2510 Atherosclerotic heart disease of native coronary artery without angina pectoris: Secondary | ICD-10-CM | POA: Diagnosis not present

## 2023-07-26 DIAGNOSIS — E785 Hyperlipidemia, unspecified: Secondary | ICD-10-CM

## 2023-07-26 DIAGNOSIS — J449 Chronic obstructive pulmonary disease, unspecified: Secondary | ICD-10-CM

## 2023-07-26 DIAGNOSIS — I10 Essential (primary) hypertension: Secondary | ICD-10-CM | POA: Diagnosis not present

## 2023-07-26 DIAGNOSIS — E876 Hypokalemia: Secondary | ICD-10-CM

## 2023-07-26 LAB — HIV ANTIBODY (ROUTINE TESTING W REFLEX): HIV Screen 4th Generation wRfx: NONREACTIVE

## 2023-07-26 LAB — ECHOCARDIOGRAM COMPLETE
Calc EF: 33.7 %
Height: 67 in
S' Lateral: 5.8 cm
Single Plane A2C EF: 34.8 %
Single Plane A4C EF: 31.3 %
Weight: 4160 [oz_av]

## 2023-07-26 LAB — BASIC METABOLIC PANEL WITH GFR
Anion gap: 9 (ref 5–15)
BUN: 12 mg/dL (ref 8–23)
CO2: 30 mmol/L (ref 22–32)
Calcium: 8.4 mg/dL — ABNORMAL LOW (ref 8.9–10.3)
Chloride: 100 mmol/L (ref 98–111)
Creatinine, Ser: 1 mg/dL (ref 0.61–1.24)
GFR, Estimated: >60 mL/min (ref >60)
Glucose, Bld: 106 mg/dL — ABNORMAL HIGH (ref 70–99)
Potassium: 3.3 mmol/L — ABNORMAL LOW (ref 3.5–5.1)
Sodium: 139 mmol/L (ref 135–145)

## 2023-07-26 LAB — MRSA NEXT GEN BY PCR, NASAL: MRSA by PCR Next Gen: NOT DETECTED

## 2023-07-26 MED ORDER — SACUBITRIL-VALSARTAN 24-26 MG PO TABS
1.0000 | ORAL_TABLET | Freq: Two times a day (BID) | ORAL | Status: DC
  Administered 2023-07-26 – 2023-07-30 (×9): 1 via ORAL
  Filled 2023-07-26 (×9): qty 1

## 2023-07-26 MED ORDER — PERFLUTREN LIPID MICROSPHERE
1.0000 mL | INTRAVENOUS | Status: AC | PRN
  Administered 2023-07-26: 3 mL via INTRAVENOUS

## 2023-07-26 MED ORDER — ATORVASTATIN CALCIUM 40 MG PO TABS
40.0000 mg | ORAL_TABLET | Freq: Every day | ORAL | Status: DC
  Administered 2023-07-26 – 2023-07-30 (×5): 40 mg via ORAL
  Filled 2023-07-26 (×5): qty 1

## 2023-07-26 MED ORDER — POTASSIUM CHLORIDE CRYS ER 20 MEQ PO TBCR
40.0000 meq | EXTENDED_RELEASE_TABLET | ORAL | Status: AC
  Administered 2023-07-26 (×3): 40 meq via ORAL
  Filled 2023-07-26 (×3): qty 2

## 2023-07-26 MED ORDER — RIVAROXABAN 20 MG PO TABS
20.0000 mg | ORAL_TABLET | Freq: Every day | ORAL | Status: DC
  Administered 2023-07-26 – 2023-07-29 (×4): 20 mg via ORAL
  Filled 2023-07-26 (×5): qty 1

## 2023-07-26 MED ORDER — SPIRONOLACTONE 12.5 MG HALF TABLET
12.5000 mg | ORAL_TABLET | Freq: Every day | ORAL | Status: DC
  Administered 2023-07-26 – 2023-07-27 (×2): 12.5 mg via ORAL
  Filled 2023-07-26 (×2): qty 1

## 2023-07-26 MED ORDER — FUROSEMIDE 10 MG/ML IJ SOLN
20.0000 mg | Freq: Once | INTRAMUSCULAR | Status: AC
  Administered 2023-07-26: 20 mg via INTRAVENOUS
  Filled 2023-07-26: qty 2

## 2023-07-26 MED ORDER — CARVEDILOL 25 MG PO TABS
25.0000 mg | ORAL_TABLET | Freq: Two times a day (BID) | ORAL | Status: DC
  Administered 2023-07-26: 25 mg via ORAL
  Filled 2023-07-26: qty 2

## 2023-07-26 MED ORDER — AMIODARONE HCL 200 MG PO TABS
200.0000 mg | ORAL_TABLET | Freq: Every day | ORAL | Status: DC
  Administered 2023-07-26 – 2023-07-30 (×5): 200 mg via ORAL
  Filled 2023-07-26 (×5): qty 1

## 2023-07-26 MED ORDER — EMPAGLIFLOZIN 10 MG PO TABS
10.0000 mg | ORAL_TABLET | Freq: Every day | ORAL | Status: DC
  Administered 2023-07-26 – 2023-07-30 (×5): 10 mg via ORAL
  Filled 2023-07-26 (×5): qty 1

## 2023-07-26 MED ORDER — CARVEDILOL 12.5 MG PO TABS
12.5000 mg | ORAL_TABLET | Freq: Two times a day (BID) | ORAL | Status: DC
  Administered 2023-07-26 – 2023-07-30 (×4): 12.5 mg via ORAL
  Filled 2023-07-26 (×6): qty 1

## 2023-07-26 MED ORDER — DIGOXIN 125 MCG PO TABS
125.0000 ug | ORAL_TABLET | Freq: Every day | ORAL | Status: DC
  Administered 2023-07-26 – 2023-07-30 (×5): 125 ug via ORAL
  Filled 2023-07-26 (×5): qty 1

## 2023-07-26 MED ORDER — FUROSEMIDE 10 MG/ML IJ SOLN
60.0000 mg | Freq: Two times a day (BID) | INTRAMUSCULAR | Status: DC
  Administered 2023-07-26 – 2023-07-29 (×6): 60 mg via INTRAVENOUS
  Filled 2023-07-26 (×6): qty 6

## 2023-07-26 NOTE — Hospital Course (Addendum)
 [name] was admitted to the hospital with the working diagnosis of heart failure decompensation.  68 yo male with the past medical history of heart failure, atrial fibrillation, coronary artery disease, hyperlipidemia and COPD who presented with worsening exertional dyspnea and lower extremity edema, along with orthopnea and PND.  06/30 patient was evaluated at the Cardiology outpatient office, found in significant volume overload. Recommendation to be evaluated in the ED for inpatient diuresis.  On his initial physical examination his blood pressure was 152/83, HR 55, RR 22 and 02 saturation 96% Lungs with no wheezing or rhonchi, heart with S1 and S2 present and regular, bradycardic, abdomen with no distention and positive lower extremity edema.   Na 139, K 3,8 Cl 100 bicarbonate 26 glucose 128 bun 11 cr 1,0  AST 27 ALT 26  Wbc 5,2 hgb 13.7 plt 181  Digoxin  0,8   Chest radiograph with cardiomegaly, bilateral hilar vascular congestion and central interstitial infiltrates bilaterally, small left pleural effusion, pacemaker defibrillator in place with one right ventricular lead.   EKG 61 bpm, normal axis, normal intervals, qtc 476, atrial fibrillation rhythm with no significant ST segment or T wave changes, poor RR wave progression.

## 2023-07-26 NOTE — Progress Notes (Incomplete)
  Progress Note   Patient: Norman Rodriguez FMW:[id number] DOB: 1955-08-06 DOA: 07/25/2023     1 DOS: the patient was seen and examined on 07/26/2023   Brief hospital course: Norman Rodriguez was admitted to the hospital with the working diagnosis of heart failure decompensation.  68 yo male with the past medical history of heart failure, atrial fibrillation, coronary artery disease, hyperlipidemia and COPD who presented with worsening exertional dyspnea and lower extremity edema, along with orthopnea and PND.  06/30 patient was evaluated at the Cardiology outpatient office, found in significant volume overload. Recommendation to be evaluated in the ED for inpatient diuresis.  On his initial physical examination his blood pressure was 152/83, HR 55, RR 22 and 02 saturation 96% Lungs with no wheezing or rhonchi, heart with S1 and S2 present and regular, bradycardic, abdomen with no distention and positive lower extremity edema.   Assessment and Plan: No notes have been filed under this hospital service. Service: Hospitalist     {Tip this will not be part of the note when signed Body mass index is 40.72 kg/m. , ,  (Optional):26781}  Subjective: ***  Physical Exam: Vitals:   07/26/23 0324 07/26/23 0600 07/26/23 0651 07/26/23 0800  BP:  122/70    Pulse:  (!) 51  61  Resp:  16  18  Temp: 98.2 F (36.8 C)  98.1 F (36.7 C)   TempSrc:      SpO2:  92%  95%  Weight:      Height:       *** Data Reviewed: {Tip this will not be part of the note when signed- Document your independent interpretation of telemetry tracing, EKG, lab, Radiology test or any other diagnostic tests. Add any new diagnostic test ordered today. (Optional):26781} {Results:26384}  Family Communication: ***  Disposition: Status is: Inpatient {Inpatient:23812}  Planned Discharge Destination: {DISCHARGE DESTINATION_TRH:27031} {Tip this will not be part of the note when signed  DVT Prophylaxis  .Rivaroxaban  (xarelto )  tablet 20 mg , Rivaroxaban  (xarelto ) tablet 20 mg  (Optional):26781}   Time spent: *** minutes  Author: Elidia Toribio Furnace, MD 07/26/2023 8:55 AM  For on call review [email or web address].

## 2023-07-26 NOTE — Progress Notes (Signed)
 Upon admission and logging into EPIC, this RN was alerted that patient Norman Rodriguez, Norman Rodriguez, had refused his evening dose of Xarelto .  Spoke with patient who states he refused it as he had taken it with his morning meds passed by dayshift RN. After conferring with RPH Earl Reome, the determination was made that the patient had not been administered Xarelto  from our facility. Last known dose was 6/30 0900 am. Pt assumed it was in his morning med pass, then refused the afternoon dose, believing it had already been given. Pt agreeable to taking Xarelto  at this time and going forward.

## 2023-07-26 NOTE — Assessment & Plan Note (Signed)
Old records personally reviewed, neurology follow up from 10/2020. Seropositive ocular myasthenia gravis, no significant bulbar, or limb weakness noted.  He is not longer on Mestinon or prednisone, never treated with long term steroids sparing agent.   

## 2023-07-26 NOTE — Progress Notes (Signed)
 PROGRESS NOTE    Norman Rodriguez  FMW:[id number] DOB: 01-Jul-1955 DOA: 07/25/2023 PCP: [name], MD  68/M w/ systolic CHF, PAfib, CAD and COPD who presented with worsening exertional dyspnea and lower extremity edema, along with orthopnea and PND.  -6/30 seen in clinic, sent to ED, Vol overloaded, labs cr 1,0  CXR w bilateral hilar vascular congestion and central interstitial infiltrates bilaterally, small left pleural effusion EKG afib rate controlled  Subjective: Feels better, diuresed very well yesterday  Assessment and Plan:  Acute on chronic systolic CHF -echo 2022  w/ EF 30 to 35%, akinesis of the LV entire inferior wall, inferolateral wall and inferoseptal wall. RV with mild reduction,  -Repeat echo with EF 30-35% with wall motion abnormality, RV low normal, moderate MR Improving with diuresis, 9.6 L negative, weight down 17 LB -Cards following, continue IV Lasix  1 more day, Coreg , Jardiance, Entresto , Aldactone  and digoxin   History of VT, defibrillator in place, keel K at 4 and Mg at 2.  - Continue Coreg   CAD (coronary artery disease) Continue atorvastatin    HTN (hypertension) As above  Permanent atrial fibrillation) (HCC) Atrial fibrillation rhythm Continue carvedilol  and rivaroxaban   Hypokalemia -repleted   COPD (chronic obstructive pulmonary disease) (HCC) No signs of acute exacerbation   Hyperlipidemia Continue with statin therapy   Chronic deep vein thrombosis (DVT) of femoral vein of right lower extremity (HCC) Continue with rivaroxaban .  Obesity, class 3 Calculated BMI 40,7   DVT prophylaxis: xarelto  Code Status: Full code Family Communication: Full Code Disposition Plan: Home likely 1 to 2 days  Consultants:    Procedures:   Antimicrobials:    Objective: Vitals:   07/26/23 0954 07/26/23 1012 07/26/23 1350 07/26/23 1530  BP: 112/76  (!) 112/58 115/72  Pulse: (!) 47 64 (!) 44 (!) 56  Resp: 20 16 19 20   Temp:    98.5 F (36.9  C)  TempSrc:    Oral  SpO2: 99% 98% 98% 95%  Weight:      Height:        Intake/Output Summary (Last 24 hours) at 07/26/2023 1538 Last data filed at 07/26/2023 1400 Gross per 24 hour  Intake --  Output 5520 ml  Net -5520 ml   Filed Weights   07/25/23 1659  Weight: 117.9 kg    Examination:  Gen: Awake, Alert, Oriented X 3,  HEENT: Plus JVD Lungs: Good air movement bilaterally, CTAB CVS: S1S2/irregular rhythm Abd: soft, Non tender, non distended, BS present Extremities: Trace edema Skin: no new rashes on exposed skin     Data Reviewed:   CBC: Recent Labs  Lab 07/25/23 1751  WBC 5.2  HGB 13.7  HCT 39.9  MCV 92.4  PLT 181   Basic Metabolic Panel: Recent Labs  Lab 07/25/23 1751 07/26/23 0252  NA 139 139  K 3.8 3.3*  CL 100 100  CO2 26 30  GLUCOSE 128* 106*  BUN 11 12  CREATININE 1.06 1.00  CALCIUM  8.8* 8.4*  MG 1.9  --    GFR: Estimated Creatinine Clearance: 84.4 mL/min (by C-G formula based on SCr of 1 mg/dL). Liver Function Tests: Recent Labs  Lab 07/25/23 1751  AST 27  ALT 26  ALKPHOS 57  BILITOT 3.1*  PROT 6.6  ALBUMIN 3.3*   No results for input(s): LIPASE, AMYLASE in the last 168 hours. No results for input(s): AMMONIA in the last 168 hours. Coagulation Profile: No results for input(s): INR, PROTIME in the last 168 hours. Cardiac Enzymes: No  results for input(s): CKTOTAL, CKMB, CKMBINDEX, TROPONINI in the last 168 hours. BNP (last 3 results) Recent Labs    10/11/22 1620 01/11/23 1500 07/11/23 1539  PROBNP 2,459* 1,520* 679.0*   HbA1C: No results for input(s): HGBA1C in the last 72 hours. CBG: No results for input(s): GLUCAP in the last 168 hours. Lipid Profile: No results for input(s): CHOL, HDL, LDLCALC, TRIG, CHOLHDL, LDLDIRECT in the last 72 hours. Thyroid  Function Tests: No results for input(s): TSH, T4TOTAL, FREET4, T3FREE, THYROIDAB in the last 72 hours. Anemia Panel: No  results for input(s): VITAMINB12, FOLATE, FERRITIN, TIBC, IRON, RETICCTPCT in the last 72 hours. Urine analysis:    Component Value Date/Time   COLORURINE YELLOW 07/11/2023 1539   APPEARANCEUR CLEAR 07/11/2023 1539   LABSPEC 1.005 07/11/2023 1539   PHURINE 5.5 07/11/2023 1539   GLUCOSEU NEGATIVE 07/11/2023 1539   HGBUR NEGATIVE 07/11/2023 1539   BILIRUBINUR NEGATIVE 11/01/2008 1522   KETONESUR NEGATIVE 07/11/2023 1539   PROTEINUR NEGATIVE 07/11/2023 1539   UROBILINOGEN 0.2 11/01/2008 1522   NITRITE NEGATIVE 11/01/2008 1522   LEUKOCYTESUR SMALL (A) 11/01/2008 1522   Sepsis Labs: @LABRCNTIP (procalcitonin:4,lacticidven:4)  )No results found for this or any previous visit (from the past 240 hours).   Radiology Studies: DG Chest Portable 1 View Result Date: 07/25/2023 CLINICAL DATA:  Shortness of breath. EXAM: PORTABLE CHEST 1 VIEW COMPARISON:  01/07/2021 FINDINGS: Left-sided pacemaker in place. Mild cardiomegaly is similar. Diffuse vascular congestion. Ill-defined opacity at the left lung base. No pneumothorax. No large pleural effusion, suboptimally assessed due to soft tissue attenuation. IMPRESSION: 1. Mild cardiomegaly with vascular congestion. 2. Ill-defined opacity at the left lung base may represent atelectasis/airspace disease or soft tissue attenuation from habitus. Electronically Signed   By: Andrea Gasman M.D.   On: 07/25/2023 17:41     Scheduled Meds:  amiodarone   200 mg Oral Daily   atorvastatin   40 mg Oral Daily   carvedilol   12.5 mg Oral BID   digoxin   125 mcg Oral Daily   empagliflozin  10 mg Oral Daily   furosemide   60 mg Intravenous BID   potassium chloride   40 mEq Oral Q4H   rivaroxaban   20 mg Oral Q supper   sacubitril -valsartan   1 tablet Oral BID   sodium chloride  flush  3 mL Intravenous Q12H   spironolactone   12.5 mg Oral Daily   Continuous Infusions:  sodium chloride        LOS: 1 day    Time spent:79min    Sigurd Pac, MD [facility]   07/26/2023, 3:38 PM

## 2023-07-26 NOTE — Assessment & Plan Note (Signed)
 Continue blood pressure control wit carvedilol  and entresto  Continue close blood pressure monitoring

## 2023-07-26 NOTE — Progress Notes (Signed)
 Progress Note   Patient: Norman Rodriguez FMW:[id number] DOB: Aug 27, 1955 DOA: 07/25/2023     1 DOS: the patient was seen and examined on 07/26/2023   Brief hospital course: Mr. Monette was admitted to the hospital with the working diagnosis of heart failure decompensation.  68 yo male with the past medical history of heart failure, atrial fibrillation, coronary artery disease, hyperlipidemia and COPD who presented with worsening exertional dyspnea and lower extremity edema, along with orthopnea and PND.  06/30 patient was evaluated at the Cardiology outpatient office, found in significant volume overload. Recommendation to be evaluated in the ED for inpatient diuresis.  On his initial physical examination his blood pressure was 152/83, HR 55, RR 22 and 02 saturation 96% Lungs with no wheezing or rhonchi, heart with S1 and S2 present and regular, bradycardic, abdomen with no distention and positive lower extremity edema.   Na 139, K 3,8 Cl 100 bicarbonate 26 glucose 128 bun 11 cr 1,0  AST 27 ALT 26  Wbc 5,2 hgb 13.7 plt 181  Digoxin  0,8   Chest radiograph with cardiomegaly, bilateral hilar vascular congestion and central interstitial infiltrates bilaterally, small left pleural effusion, pacemaker defibrillator in place with one right ventricular lead.   EKG 61 bpm, normal axis, normal intervals, qtc 476, atrial fibrillation rhythm with no significant ST segment or T wave changes, poor RR wave progression.   Assessment and Plan: * Acute on chronic systolic (congestive) heart failure (HCC) 2022 echocardiogram with reduced LV systolic function EF 30 to 35%, mild dilatation of LV cavity, akinesis of the LV entire inferior wall, inferolateral wall and inferoseptal wall. RV systolic function with mild reduction, RVSP 67,7 mmHg, LA with moderate dilatation, RA with mild dilatation, mild MR  Continue volume overloaded.   Will continue diuresis with furosemide , will increase dose to 60 mg IV  bid Continue entresto , carvedilol  and digoxin  Will add SGLT 2 inh and spironolactone  Follow up on updated echocardiogram  History of VT, defibrillator in place, keel K at 4 and Mg at 2.   CAD (coronary artery disease) No chest pain, no clinical signs of acute coronary syndrome  Continue atorvastatin    HTN (hypertension) Continue blood pressure control wit carvedilol  and entresto  Continue close blood pressure monitoring   PAF (paroxysmal atrial fibrillation) (HCC) Atrial fibrillation rhythm Continue carvedilol  for rate control and rivaroxaban  for anticoagulation   Hypokalemia Renal function stable, with a serum cr of 1,0, K is 3.3  Plan to continue diuresis with furosemide , spironolactone  and SGLT 2 inh Add Kcl 40 meq x 3 doses and follow up renal function and electrolytes in am.   COPD (chronic obstructive pulmonary disease) (HCC) No signs of acute exacerbation   Hyperlipidemia Continue with statin therapy   Chronic deep vein thrombosis (DVT) of femoral vein of right lower extremity (HCC) Continue with rivaroxaban .  Obesity, class 3 Calculated BMI 40,7      Subjective: Patient is feeling better but not back to his baseline, continue to have dyspnea and lower extremity edema, positive PND   Physical Exam: Vitals:   07/26/23 0324 07/26/23 0600 07/26/23 0651 07/26/23 0800  BP:  122/70    Pulse:  (!) 51  61  Resp:  16  18  Temp: 98.2 F (36.8 C)  98.1 F (36.7 C)   TempSrc:      SpO2:  92%  95%  Weight:      Height:       Neurology awake and alert ENT with mild pallor Cardiovascular with  S1 and S2 present, irregularly irregular with no gallops, rubs or murmurs Respiratory with rales at bases with no wheezing or rhonchi  Abdomen protuberant but not distended or tender Positive lower extremity edema +++ pitting   Data Reviewed:    Family Communication: no family at the bedside   Disposition: Status is: Inpatient Remains inpatient appropriate because: IV  diuresis   Planned Discharge Destination: Home    Author: Elidia Toribio Furnace, MD 07/26/2023 9:53 AM  For on call review [email or web address].

## 2023-07-26 NOTE — Progress Notes (Signed)
 Echocardiogram 2D Echocardiogram has been performed.  Damien FALCON Sheral Pfahler RDCS 07/26/2023, 1:50 PM

## 2023-07-26 NOTE — Assessment & Plan Note (Signed)
 Calculated BMI 40,7

## 2023-07-26 NOTE — Assessment & Plan Note (Addendum)
 No chest pain, no clinical signs of acute coronary syndrome  Continue atorvastatin 

## 2023-07-26 NOTE — Consult Note (Addendum)
 Cardiology Consultation   Patient ID: GEFFREY MICHAELSEN MRN: [id number]; DOB: [birth year]  Admit date: 07/25/2023 Date of Consult: 07/26/2023  PCP:  [name], MD   [facility] Providers Cardiologist:  Oneil Parchment, MD  Electrophysiologist:  Will Gladis Norton, MD    Patient Profile: Norman Rodriguez is a 68 y.o. male with a hx of permanent atrial fibrillation (failed amiodarone  and Tikosyn ), CAD s/p PCI to LAD, hypertension, chronic HFrEF s/p ICD, history of VT, COPD, hyperlipidemia who is being seen 07/26/2023 for the evaluation of acute on chronic HFrEF at the request of Dr. Noralee.  History of Present Illness: Norman Rodriguez past medical history as stated above.  He was seen by Dr. Parchment in clinic yesterday, 07/25/2023, for shortness of breath and fluid retention.  At this appointment he endorsed that he experiences worsening episodes of shortness of breath, particularly at night, waking up every night around 2:30 to 3 AM gasping for breath.  He reports having chest tightness during these episodes as well.  While he denied any active chest pain while in the clinic, he was reported to be gasping for breath and having chest tightness.  He reported having the feeling of fluid retention and episodes of brown urine.  He states that he was currently taking Lasix  40 mg which helped slightly.  He believes that he Rodriguez been feeling worse since around June 16.  He was noted to have significant LE edema as well as lower abdominal edema.  Due to his symptoms as listed above, Dr. Parchment adjusted that patient present to the emergency department for further evaluation and admission.  He then presented to the [facility] emergency department, he Rodriguez been admitted to medicine service with cardiology as consult.  Relevant workup while in the ED/since admission includes: CMP stable, CBC stable, digoxin  level 0.8, CXR showed mild cardiomegaly with vascular congestion.  He Rodriguez been started on IV  diuresis, restarted on most of his home medications including carvedilol , digoxin , Jardiance, Entresto , spironolactone , Xarelto , Lipitor .  After speaking with the patient, he agrees with the history as stated above. He says he Rodriguez been feeling progressively worse for about a week and then it progressed to the point where he thought he wasn't going to be able to make it into the hospital. He denies any recent changes in his diet, refrains from large amounts of sodium. However, he does say that he lives in a home without proper [facility] and with it being so hot, he believes that he Rodriguez been drinking more water. He also notes that he does get takeout food often, which he does not know how much salt they are putting in.  Past Medical History:  Diagnosis Date   ACS (acute coronary syndrome) (HCC)    Arthritis    Atrial fibrillation (HCC)    a. Dx 2010. b. Initiated on Tikosyn  03/2013.   BRBPR (bright red blood per rectum) 10/22/2017   CAD (coronary artery disease)    a. BMS 2.40mm 23 to LAD. CTO of RCA unsuccessful, good left to right collaterals - 2010.   Chronic anticoagulation    Chronic systolic CHF (congestive heart failure) (HCC)    a. Both ischemic and tachy induced. b. Echo 04/2013: EF 30-35%, no RWMA, normal RV, mod dilated LA (before restoration of NSR).   COPD (chronic obstructive pulmonary disease) (HCC) 10/22/2017   Dysrhythmia    hx of atrial fibrilation   HTN (hypertension)    Hyperlipidemia    Melena  NSTEMI (non-ST elevated myocardial infarction) (HCC) 01/05/2021   NSVT (nonsustained ventricular tachycardia) (HCC)    Obesity    Polyp of cecum    Polyp of descending colon    Polyp of rectum    Polyp of transverse colon    Shortness of breath    Past Surgical History:  Procedure Laterality Date   CARDIAC DEFIBRILLATOR PLACEMENT  2024   CARDIOVERSION N/A 04/27/2013   Procedure: BEDSIDE CARDIOVERSION;  Surgeon: Jerel Balding, MD;  Location: [facility] OR;  Service: Cardiovascular;   Laterality: N/A;   CARDIOVERSION N/A 05/15/2013   Procedure: CARDIOVERSION/BEDSIDE;  Surgeon: Ezra GORMAN Shuck, MD;  Location: [facility] OR;  Service: Cardiovascular;  Laterality: N/A;   CARDIOVERSION N/A 05/16/2014   Procedure: CARDIOVERSION;  Surgeon: Oneil JAYSON Parchment, MD;  Location: [facility] ENDOSCOPY;  Service: Cardiovascular;  Laterality: N/A;   CARDIOVERSION N/A 06/07/2014   Procedure: CARDIOVERSION;  Surgeon: Oneil JAYSON Parchment, MD;  Location: [facility] ENDOSCOPY;  Service: Cardiovascular;  Laterality: N/A;   COLONOSCOPY WITH PROPOFOL  N/A 10/27/2017   Procedure: COLONOSCOPY WITH PROPOFOL ;  Surgeon: Shila Gustav GAILS, MD;  Location: [facility] ENDOSCOPY;  Service: Endoscopy;  Laterality: N/A;   CORONARY ANGIOPLASTY WITH STENT PLACEMENT  01/26/2008   BMS 2.67mm 23 to LAD. CTO of RCA unsuccessful, good left to right collaterals   ESOPHAGOGASTRODUODENOSCOPY (EGD) WITH PROPOFOL  N/A 10/24/2017   Procedure: ESOPHAGOGASTRODUODENOSCOPY (EGD) WITH PROPOFOL ;  Surgeon: Shila Gustav GAILS, MD;  Location: [facility] ENDOSCOPY;  Service: Endoscopy;  Laterality: N/A;   FOOT SURGERY Right    ICD IMPLANT N/A 01/06/2021   Procedure: ICD IMPLANT;  Surgeon: Inocencio Soyla Lunger, MD;  Location: [facility] INVASIVE CV LAB;  Service: Cardiovascular;  Laterality: N/A;   LEFT HEART CATH AND CORONARY ANGIOGRAPHY N/A 01/05/2021   Procedure: LEFT HEART CATH AND CORONARY ANGIOGRAPHY;  Surgeon: Court Dorn PARAS, MD;  Location: [facility] INVASIVE CV LAB;  Service: Cardiovascular;  Laterality: N/A;   POLYPECTOMY  10/27/2017   Procedure: POLYPECTOMY;  Surgeon: Shila Gustav GAILS, MD;  Location: [facility] ENDOSCOPY;  Service: Endoscopy;;    Home Medications:  Prior to Admission medications   Medication Sig Start Date End Date Taking? Authorizing Provider  acetaminophen  (GNP PAIN RELIEF EX-STRENGTH) 500 MG tablet TAKE 1-2 TABLETS BY MOUTH THREE (THREE) TIMES DAILY AS NEEDED. 03/10/23  Yes [name], MD  allopurinol  (ZYLOPRIM ) 100 MG tablet TAKE TWO TABLETS (200 MG TOTAL) BY  MOUTH DAILY 06/01/23  Yes Rice, Lonni ORN, MD  amiodarone  (PACERONE ) 200 MG tablet Take 1 tablet (200 mg total) by mouth daily. 400 MG TWICE A DAY 2 WEEKS 200 MG TWICE A DAY 2 WEEKS THEN DAILY 01/11/23  Yes Leverne Charlies Helling, PA-C  atorvastatin  (LIPITOR ) 40 MG tablet TAKE ONE TABLET BY MOUTH DAILY 04/01/23  Yes Parchment Oneil JAYSON, MD  carvedilol  (COREG ) 25 MG tablet TAKE ONE TABLET BY MOUTH TWICE A DAY 08/03/22  Yes Parchment Oneil JAYSON, MD  Coenzyme Q10 (CO Q-10) 200 MG CAPS Take 200 mg by mouth daily.   Yes [provider]  Colchicine  0.6 MG CAPS Day 1: Take 1 caps. If still having swelling, may take 1 cap the following day. 11/05/22  Yes [name], MD  digoxin  (LANOXIN ) 0.125 MG tablet TAKE ONE TABLET BY MOUTH DAILY 04/18/23  Yes Parchment Oneil JAYSON, MD  fexofenadine (ALLEGRA) 180 MG tablet Take 180 mg by mouth daily.   Yes [provider]  furosemide  (LASIX ) 40 MG tablet Take 1 tablet (40 mg total) by mouth 2 (two) times daily. Patient  taking differently: Take 40 mg by mouth daily. 01/13/23  Yes Leverne Charlies Helling, PA-C  gabapentin  (NEURONTIN ) 300 MG capsule Take 300 mg by mouth at bedtime as needed (sleep nerve pain).   Yes [provider]  Multiple Vitamin (MULTIVITAMIN) tablet Take 1 tablet by mouth daily.   Yes [provider]  nitroGLYCERIN  (NITROSTAT ) 0.4 MG SL tablet Place 1 tablet (0.4 mg total) under the tongue every 5 (five) minutes as needed for chest pain. 07/25/23  Yes Jeffrie Oneil BROCKS, MD  Omega-3 Fatty Acids (OMEGA 3 PO) Take 1 capsule by mouth daily.   Yes [provider]  rivaroxaban  (XARELTO ) 20 MG TABS tablet TAKE ONE TABLET BY MOUTH DAILY WITH SUPPER 06/01/23  Yes Jeffrie Oneil BROCKS, MD  sacubitril -valsartan  (ENTRESTO ) 24-26 MG Take 1 tablet by mouth 2 (two) times daily. 07/01/23  Yes Jeffrie Oneil BROCKS, MD  vitamin C  (ASCORBIC ACID ) 500 MG tablet Take 500 mg by mouth daily.   Yes [provider]  VITAMIN D, CHOLECALCIFEROL , PO Take 5,000 Units by  mouth daily.   Yes [provider]    Scheduled Meds:  atorvastatin   40 mg Oral Daily   carvedilol   12.5 mg Oral BID   digoxin   125 mcg Oral Daily   empagliflozin  10 mg Oral Daily   furosemide   60 mg Intravenous BID   potassium chloride   40 mEq Oral Q4H   rivaroxaban   20 mg Oral Q supper   sacubitril -valsartan   1 tablet Oral BID   sodium chloride  flush  3 mL Intravenous Q12H   spironolactone   12.5 mg Oral Daily   Continuous Infusions:  sodium chloride      PRN Meds: sodium chloride , acetaminophen , ondansetron  (ZOFRAN ) IV, perflutren  lipid microspheres (DEFINITY ) IV suspension, sodium chloride  flush  Allergies:    Allergies  Allergen Reactions   Apple Anaphylaxis   Apple Juice Anaphylaxis   Soybean Oil Other (See Comments)    unknown   Flaxseed (Linseed)    Lisinopril Cough   Other Itching    Pt Rodriguez itching and joint swelling with walnuts, mildew, fungi, basil and peaches   Bean Pod Extract Rash    Green Beans specifically Joint swelling and itching   Carrot [Daucus Carota] Rash    Joint swelling and itching   Grass Extracts [Gramineae Pollens] Rash    Joint swelling and itching   Peach Flavoring Agent (Non-Screening) Rash    Joint swelling and itching   Tree Extract Rash    Joint swelling and itching    Social History:   Social History   Socioeconomic History   Marital status: Single    Spouse name: Not on file   Number of children: Not on file   Years of education: Not on file   Highest education level: Not on file  Occupational History   Not on file  Tobacco Use   Smoking status: Former    Types: Cigars    Passive exposure: Never   Smokeless tobacco: Never   Tobacco comments:    1 cigar per month states he stopped smoking  Vaping Use   Vaping status: Never Used  Substance and Sexual Activity   Alcohol use: Yes    Alcohol/week: 3.0 standard drinks of alcohol    Types: 3 Glasses of wine per week   Drug use: No    Comment: 1 per month...pt  states he stopped smoking   Sexual activity: Not Currently  Other Topics Concern   Not on file  Social  History Narrative   Not on file   Social Drivers of Health   Financial Resource Strain: Low Risk  (02/07/2023)   Overall Financial Resource Strain (CARDIA)    Difficulty of Paying Living Expenses: Not hard at all  Food Insecurity: No Food Insecurity (02/07/2023)   Hunger Vital Sign    Worried About Running Out of Food in the Last Year: Never true    Ran Out of Food in the Last Year: Never true  Transportation Needs: No Transportation Needs (02/07/2023)   PRAPARE - Administrator, Civil Service (Medical): No    Lack of Transportation (Non-Medical): No  Physical Activity: Inactive (02/07/2023)   Exercise Vital Sign    Days of Exercise per Week: 0 days    Minutes of Exercise per Session: 0 min  Stress: No Stress Concern Present (02/07/2023)   [employer] of Occupational Health - Occupational Stress Questionnaire    Feeling of Stress : Not at all  Social Connections: Moderately Isolated (02/07/2023)   Social Connection and Isolation Panel    Frequency of Communication with Friends and Family: More than three times a week    Frequency of Social Gatherings with Friends and Family: Not on file    Attends Religious Services: More than 4 times per year    Active Member of [employer] or Organizations: No    Attends Banker Meetings: Never    Marital Status: Never married  Intimate Partner Violence: Not At Risk (02/07/2023)   Humiliation, Afraid, Rape, and Kick questionnaire    Fear of Current or Ex-Partner: No    Emotionally Abused: No    Physically Abused: No    Sexually Abused: No    Family History:   Family History  Problem Relation Age of Onset   Other Father        Car accident   Coronary artery disease Neg Hx      ROS:  Please see the history of present illness.  All other ROS reviewed and negative.     Physical Exam/Data: Vitals:   07/26/23 0800  07/26/23 0954 07/26/23 1012 07/26/23 1350  BP:  112/76  (!) 112/58  Pulse: 61 (!) 47 64 (!) 44  Resp: 18 20 16 19   Temp:      TempSrc:      SpO2: 95% 99% 98% 98%  Weight:      Height:       Intake/Output Summary (Last 24 hours) at 07/26/2023 1448 Last data filed at 07/26/2023 1400 Gross per 24 hour  Intake --  Output 5520 ml  Net -5520 ml      07/25/2023    4:59 PM 07/25/2023    3:02 PM 07/11/2023    3:04 PM  Last 3 Weights  Weight (lbs) 260 lb 260 lb 249 lb 9.6 oz  Weight (kg) 117.935 kg 117.935 kg 113.218 kg     Body mass index is 40.72 kg/m.   General: in no acute distress, on room air HEENT: normal Vascular: Distal pulses 2+ bilaterally Cardiac:  normal S1, S2; RRR; no murmur  Lungs:  bibasilar crackles present   Abd: soft, nontender, no hepatomegaly  Ext: 2-3+ pitting LE edema Musculoskeletal:  No deformities, BUE and BLE strength normal and equal Skin: warm and dry  Neuro:  no focal abnormalities noted Psych:  Normal affect   EKG:  The EKG was personally reviewed and demonstrates: Atrial fibrillation, HR 61  Telemetry:  Telemetry was personally reviewed and demonstrates:  atrial  fibrillation, HR 50-60s  Relevant CV Studies: Echocardiogram, 07/26/2023 Ordered, pending results  Remote device check, 07/04/2023 Normal device function.  Presenting rhythm:  VS  Alert status for successful ATP delivered.   Event occurred 6/9 @ 16:37, duration 22sec per device, HR 181.   EGM c/w sustained VT pace terminated with 1 burst of ATP - route to triage per protocol  Numerous NSVT and SVT, no EGM's for review.  Recent increase in activity and HR's per trends   Cardiac catheterization, 01/05/2021, performed by Dr. Court   Prox Cx to Mid Cx lesion is 70% stenosed.   Prox RCA to Mid RCA lesion is 90% stenosed.   Mid RCA lesion is 100% stenosed.   Previously placed Prox LAD to Mid LAD stent (unknown type) is  widely patent.   There is severe left ventricular systolic  dysfunction.   LV end diastolic pressure is mildly elevated.   The left ventricular ejection fraction is 25-35% by visual estimate.  Laboratory Data: High Sensitivity Troponin:  No results for input(s): TROPONINIHS in the last 720 hours.   Chemistry Recent Labs  Lab 07/25/23 1751 07/26/23 0252  NA 139 139  K 3.8 3.3*  CL 100 100  CO2 26 30  GLUCOSE 128* 106*  BUN 11 12  CREATININE 1.06 1.00  CALCIUM  8.8* 8.4*  MG 1.9  --   GFRNONAA >60 >60  ANIONGAP 13 9    Recent Labs  Lab 07/25/23 1751  PROT 6.6  ALBUMIN 3.3*  AST 27  ALT 26  ALKPHOS 57  BILITOT 3.1*   Lipids No results for input(s): CHOL, TRIG, HDL, LABVLDL, LDLCALC, CHOLHDL in the last 168 hours.  Hematology Recent Labs  Lab 07/25/23 1751  WBC 5.2  RBC 4.32  HGB 13.7  HCT 39.9  MCV 92.4  MCH 31.7  MCHC 34.3  RDW 15.5  PLT 181   Thyroid  No results for input(s): TSH, FREET4 in the last 168 hours.  BNPNo results for input(s): BNP, PROBNP in the last 168 hours.  DDimer No results for input(s): DDIMER in the last 168 hours.  Radiology/Studies:  DG Chest Portable 1 View Result Date: 07/25/2023 CLINICAL DATA:  Shortness of breath. EXAM: PORTABLE CHEST 1 VIEW COMPARISON:  01/07/2021 FINDINGS: Left-sided pacemaker in place. Mild cardiomegaly is similar. Diffuse vascular congestion. Ill-defined opacity at the left lung base. No pneumothorax. No large pleural effusion, suboptimally assessed due to soft tissue attenuation. IMPRESSION: 1. Mild cardiomegaly with vascular congestion. 2. Ill-defined opacity at the left lung base may represent atelectasis/airspace disease or soft tissue attenuation from habitus. Electronically Signed   By: Andrea Gasman M.D.   On: 07/25/2023 17:41   Assessment and Plan: Acute on chronic HFrEF s/p ICD 2022 Hypertension Patient presents to ED, from outpatient cardiology appointment for acute CHF exacerbation Symptoms: DOE, orthopnea, LE edema CXR showed  cardiomegaly, vascular congestion Home meds: Carvedilol  25 mg BID, Lasix  40 mg daily, digoxin  0.125 mg daily, Entresto  24-26 mg BID Given IV Lasix  80 mg x 1, started on 60 mg BID today  Net - 5.5 L since admission  Creatinine is stable  Continue IV Lasix  60 mg BID, monitor response and adjust dose as needed Continue carvedilol  25 mg BID Continue Jardiance 10 mg daily Continue Entresto  24-26 mg BID Continue spironolactone  12.5 mg daily Pending updated echocardiogram  Permanent atrial fibrillation History of ventricular tachycardia Followed closely by our EP team Home meds: Xarelto  20 mg daily, carvedilol  25 mg BID, amiodarone  200 mg daily Currently  in atrial fibrillation with HR 50-60s  History of VT typically treated with ATP therapy Continue carvedilol  25 mg BID Continue Xarelto  20 mg daily  Restart home amiodarone    CAD s/p PCI to LAD Hyperlipidemia No current chest pain Home meds: Lipitor  40 mg daily, PRN sublingual nitroglycerin  LHC from 12/2020 showed patent LAD stent, CTO of RCA with left-to-right collaterals Continue Lipitor   Continue SL NTG  Risk Assessment/Risk Scores:      New York  Heart Association (NYHA) Functional Class NYHA Class III  CHA2DS2-VASc Score = 4   This indicates a 4.8% annual risk of stroke. The patient's score is based upon: CHF History: 1 HTN History: 1 Diabetes History: 0 Stroke History: 0 Vascular Disease History: 1 Age Score: 1 Gender Score: 0      For questions or updates, please contact [facility] Please consult [email or web address] for contact info under    Signed, Waddell DELENA Donath, PA-C  07/26/2023 2:48 PM  I have personally seen and examined this patient. I agree with the assessment and plan as outlined above.  68 year old male with history of PAF, CAD, HTN, chronic systolic CHF, ischemic cardiomyopathy, VT, COPD, HLD admitted with dyspnea, abdominal swelling and LE edema and found to have volume overload.  He c/o  dyspnea which is improved since admission following diuresis. Chest x-ray with pulmonary edema.  Labs reviewed by me EKG reviewed by me and shows atrial fib, Non-specific ST and T wave abn My exam: NAD  Irreg irreg. Lungs: basilar crackles. Ext: 2+bilateral LE edema Plan:  Acute on chronic systolic CHF: He is volume overloaded. Agree with diuresis with IV Lasix . Continue home medications as above. Echo pending.   Lonni Cash, MD, [facility] 07/26/2023 3:17 PM

## 2023-07-26 NOTE — Assessment & Plan Note (Addendum)
 2022 echocardiogram with reduced LV systolic function EF 30 to 35%, mild dilatation of LV cavity, akinesis of the LV entire inferior wall, inferolateral wall and inferoseptal wall. RV systolic function with mild reduction, RVSP 67,7 mmHg, LA with moderate dilatation, RA with mild dilatation, mild MR  Continue volume overloaded.   Will continue diuresis with furosemide , will increase dose to 60 mg IV bid Continue entresto , carvedilol  and digoxin  Will add SGLT 2 inh and spironolactone  Follow up on updated echocardiogram  History of VT, defibrillator in place, keel K at 4 and Mg at 2.

## 2023-07-26 NOTE — Assessment & Plan Note (Signed)
 Continue with statin therapy.  ?

## 2023-07-26 NOTE — Progress Notes (Signed)
 Orthopedic Tech Progress Note Patient Details:  Norman Rodriguez 05-Sep-1955 [id number]  Ortho Devices Type of Ortho Device: Radio broadcast assistant Ortho Device/Splint Location: BLE Ortho Device/Splint Interventions: Ordered, Application   Post Interventions Patient Tolerated: Well  Norman Rodriguez 07/26/2023, 3:07 PM

## 2023-07-26 NOTE — Assessment & Plan Note (Signed)
 Atrial fibrillation rhythm Continue carvedilol  for rate control and rivaroxaban  for anticoagulation

## 2023-07-26 NOTE — Plan of Care (Signed)

## 2023-07-26 NOTE — Assessment & Plan Note (Signed)
 Renal function stable, with a serum cr of 1,0, K is 3.3  Plan to continue diuresis with furosemide , spironolactone  and SGLT 2 inh Add Kcl 40 meq x 3 doses and follow up renal function and electrolytes in am.

## 2023-07-26 NOTE — Assessment & Plan Note (Addendum)
Continue  with rivaroxaban.  ?

## 2023-07-27 ENCOUNTER — Encounter (HOSPITAL_COMMUNITY): Payer: Self-pay | Admitting: Internal Medicine

## 2023-07-27 ENCOUNTER — Telehealth (HOSPITAL_COMMUNITY): Payer: Self-pay

## 2023-07-27 ENCOUNTER — Other Ambulatory Visit (HOSPITAL_COMMUNITY): Payer: Self-pay

## 2023-07-27 DIAGNOSIS — I5023 Acute on chronic systolic (congestive) heart failure: Secondary | ICD-10-CM | POA: Diagnosis not present

## 2023-07-27 DIAGNOSIS — I4819 Other persistent atrial fibrillation: Secondary | ICD-10-CM | POA: Diagnosis not present

## 2023-07-27 DIAGNOSIS — I4821 Permanent atrial fibrillation: Secondary | ICD-10-CM | POA: Diagnosis not present

## 2023-07-27 DIAGNOSIS — I10 Essential (primary) hypertension: Secondary | ICD-10-CM | POA: Diagnosis not present

## 2023-07-27 DIAGNOSIS — I2510 Atherosclerotic heart disease of native coronary artery without angina pectoris: Secondary | ICD-10-CM | POA: Diagnosis not present

## 2023-07-27 LAB — BASIC METABOLIC PANEL WITH GFR
Anion gap: 15 (ref 5–15)
BUN: 16 mg/dL (ref 8–23)
CO2: 25 mmol/L (ref 22–32)
Calcium: 9 mg/dL (ref 8.9–10.3)
Chloride: 98 mmol/L (ref 98–111)
Creatinine, Ser: 1.11 mg/dL (ref 0.61–1.24)
GFR, Estimated: >60 mL/min (ref >60)
Glucose, Bld: 115 mg/dL — ABNORMAL HIGH (ref 70–99)
Potassium: 3.9 mmol/L (ref 3.5–5.1)
Sodium: 138 mmol/L (ref 135–145)

## 2023-07-27 LAB — MAGNESIUM: Magnesium: 2.1 mg/dL (ref 1.7–2.4)

## 2023-07-27 MED ORDER — SPIRONOLACTONE 25 MG PO TABS: 25.0000 mg | ORAL_TABLET | Freq: Every day | ORAL | Status: DC

## 2023-07-27 MED ORDER — SPIRONOLACTONE 25 MG PO TABS
25.0000 mg | ORAL_TABLET | Freq: Every day | ORAL | Status: DC
  Administered 2023-07-28 – 2023-07-30 (×3): 25 mg via ORAL
  Filled 2023-07-27 (×3): qty 1

## 2023-07-27 MED ORDER — SPIRONOLACTONE 12.5 MG HALF TABLET
12.5000 mg | ORAL_TABLET | Freq: Once | ORAL | Status: AC
  Administered 2023-07-27: 12.5 mg via ORAL
  Filled 2023-07-27: qty 1

## 2023-07-27 MED ORDER — POTASSIUM CHLORIDE CRYS ER 20 MEQ PO TBCR
40.0000 meq | EXTENDED_RELEASE_TABLET | Freq: Once | ORAL | Status: AC
  Administered 2023-07-27: 40 meq via ORAL
  Filled 2023-07-27: qty 2

## 2023-07-27 NOTE — Progress Notes (Addendum)
 Progress Note  Patient Name: Norman Rodriguez Date of Encounter: 07/27/2023 [facility] Cardiologist: Oneil Parchment, MD   Interval Summary    Breathing has improved, feels less tight in his abd.   Vital Signs Vitals:   07/26/23 2343 07/27/23 0408 07/27/23 0500 07/27/23 0725  BP: 128/74 137/70  131/68  Pulse: (!) 53 (!) 46 (!) 49 62  Resp: 20 15 17  (!) 21  Temp: 98.3 F (36.8 C) 98.5 F (36.9 C)  98.3 F (36.8 C)  TempSrc: Oral Oral  Oral  SpO2: 94% 92% 95% 93%  Weight:   110.4 kg   Height:        Intake/Output Summary (Last 24 hours) at 07/27/2023 0742 Last data filed at 07/27/2023 0535 Gross per 24 hour  Intake 716 ml  Output 7420 ml  Net -6704 ml      07/27/2023    5:00 AM 07/25/2023    4:59 PM 07/25/2023    3:02 PM  Last 3 Weights  Weight (lbs) 243 lb 6.2 oz 260 lb 260 lb  Weight (kg) 110.4 kg 117.935 kg 117.935 kg      Telemetry/ECG   Atrial fibrillation, 50s - Personally Reviewed  Physical Exam  GEN: No acute distress.   Neck: difficult to assess JVD 2/2 neck girth Cardiac: Irreg Irreg, no murmurs, rubs, or gallops.  Respiratory: Clear to auscultation bilaterally. GI: Soft, nontender, ventral hernia  MS: unna boots bilaterally  Assessment & Plan   68 y.o. male with a hx of permanent atrial fibrillation (failed amiodarone  and Tikosyn ), CAD s/p PCI to LAD, hypertension, chronic HFrEF s/p ICD, history of VT, COPD, hyperlipidemia who was seen 07/26/2023 for the evaluation of acute on chronic HFrEF at the request of Dr. Noralee.   Acute on Chronic HFrEF -- Known LV EF 30 to 35% '22, echo this admission EF of 30 to 35%, low normal RV function, mildly PA pressure, severe biatrial dilation, moderate MR -- Presented with orthopnea, dyspnea, lower extremity edema.  Chest x-ray notable for vascular congestion.  -- On IV Lasix  60 mg twice daily, net -9.6 L.  Weight down to 260 to 243 pounds. Still volume up, continue IV lasix   -- GDMT: Continue carvedilol  12.5  mg twice daily, Jardiance 10 mg daily, Entresto  24-26 mg twice daily, increase spironolactone  to 25 mg daily, digoxin  0.125 mg daily  Permanent atrial fibrillation -- Remains in rate controlled atrial fibrillation -- Continue Xarelto  20 mg daily, carvedilol  12.5 mg twice daily, amiodarone  200 mg daily  HTN -- controlled -- continue coreg  12.5mg  BID, Entresto  24-26mg  BID, spiro 25mg  daily  Hx of VT s/p ICD '22 -- Followed by EP  CAD s/p prior PCI to LAD -- Denies any anginal symptoms -- No aspirin  in the setting of Xarelto , continue atorvastatin  40 mg daily  COPD -- no wheezing on exam -- Anoro Ellipta  PTA  For questions or updates, please contact [facility] Please consult [email or web address] for contact info under       Signed, Manuelita Rummer, NP   I have personally seen and examined this patient. I agree with the assessment and plan as outlined above.  68 yo male with history HFrEF, atrial fib, CAD admitted with acute on chronic systolic CHF. He has diuresed well with IV Lasix . Still volume overloaded.  Labs reviewed.  Tele with atrial fib My exam: +LE edema, wrapped. NAD. Irreg irreg. Lungs: clear  Acute on chronic systolic CHF: Continue IV lasix  today.   Lonni Cash, MD,  FACC 07/27/2023 9:28 AM

## 2023-07-27 NOTE — TOC CM/SW Note (Signed)
 Transition of Care [facility]) - Inpatient Brief Assessment   Patient Details  Name: Norman Rodriguez MRN: [id number] Date of Birth: 17-May-1955  [facility] of [facility] CM/SW Contact:    Lauraine FORBES Saa, LCSW Phone Number: 07/27/2023, 4:13 PM   Clinical Narrative:  4:13 PM CSW provided SDOH (social connections) resources.  Transition of Care Asessment: Insurance and Status: Insurance coverage has been reviewed Patient has primary care physician: Yes Home environment has been reviewed: safe to discharge home Prior level of function:: walks with cane @ baseline Prior/Current Home Services: No current home services Social Drivers of Health Review: SDOH reviewed no interventions necessary Readmission risk has been reviewed: Yes Transition of care needs: transition of care needs identified, TOC will continue to follow (Heart failure consult)

## 2023-07-27 NOTE — Progress Notes (Addendum)
 Error - see prior note from this AM

## 2023-07-27 NOTE — Progress Notes (Signed)
 Mobility Specialist Progress Note;   07/27/23 0940  Mobility  Activity Ambulated with assistance in room;Transferred from bed to chair  Level of Assistance Standby assist, set-up cues, supervision of patient - no hands on  Assistive Device Cane  Distance Ambulated (ft) 15 ft  Activity Response Tolerated well  Mobility Referral Yes  Mobility visit 1 Mobility  Mobility Specialist Start Time (ACUTE ONLY) 0940  Mobility Specialist Stop Time (ACUTE ONLY) 0953  Mobility Specialist Time Calculation (min) (ACUTE ONLY) 13 min   RN requesting pt to ambulate, pt agreeable. Distance limited d/t pt not wanting to leave personal belongings. Ambulated in room at [facility] level. Requested to sit in chair at [facility]. Pt left in chair with all needs met, call bell in reach. RN notified.   Lauraine Erm Mobility Specialist Please contact via SecureChat or [employer] [phone]

## 2023-07-27 NOTE — TOC CM/SW Note (Signed)
 Transition of Care [facility]) - Inpatient Brief Assessment   Patient Details  Name: Norman Rodriguez MRN: [id number] Date of Birth: 06-04-1955  Transition of Care [facility]) CM/SW Contact:    Roxie KANDICE Stain, RN Phone Number: 07/27/2023, 11:52 AM   Clinical Narrative:  Patient admitted for CHF. From home and has no Hx-HH or SNF. TOC will continue to follow.   Transition of Care Asessment: Insurance and Status: Insurance coverage has been reviewed Patient has primary care physician: Yes Home environment has been reviewed: safe to discharge home Prior level of function:: walks with cane @ baseline Prior/Current Home Services: No current home services Social Drivers of Health Review: SDOH reviewed no interventions necessary Readmission risk has been reviewed: Yes Transition of care needs: transition of care needs identified, TOC will continue to follow (Heart failure consult)

## 2023-07-27 NOTE — Plan of Care (Signed)

## 2023-07-27 NOTE — Progress Notes (Signed)
 Heart Failure Nurse Navigator Progress Note  PCP: [name], MD PCP-Cardiologist: Jeffrie Admission Diagnosis: Acute on chronic congestive heart failure.  Admitted from: Cardiologist office via EMS  Presentation:   Norman Rodriguez presented with shortness of breath, BLE edema +1. BP 182/60, HR 60 in A-Fib. ProBNP elevated, CXR showed mild cardiomegaly with vascular congestion, ill-defined opacity of the left lung base may be atelectasis or airspace disease.    Patient was educated on the sign and symptoms of heart failure, daily weights, when to call his doctor or go to the ED. Diet/ fluid restrictions ( reports he does eat take out food often and drinks well over 64 oz daily, in the form of Soda, Tang, and water. )  Continued education on the importance of taking all medications as prescribed , he reports to using Mail order thru his pharmacy because he doesn't drive, he use Transportation thru [facility] for all his appointment needs. Patient verbalized his understanding of all education. A HF TOC appointment was scheduled for 08/04/2023 @ 2:45 pm.   ECHO/ LVEF: 30-35%  Clinical Course:  Past Medical History:  Diagnosis Date   ACS (acute coronary syndrome) (HCC)    Arthritis    Atrial fibrillation (HCC)    a. Dx 2010. b. Initiated on Tikosyn  03/2013.   BRBPR (bright red blood per rectum) 10/22/2017   CAD (coronary artery disease)    a. BMS 2.74mm 23 to LAD. CTO of RCA unsuccessful, good left to right collaterals - 2010.   Chronic anticoagulation    Chronic systolic CHF (congestive heart failure) (HCC)    a. Both ischemic and tachy induced. b. Echo 04/2013: EF 30-35%, no RWMA, normal RV, mod dilated LA (before restoration of NSR).   COPD (chronic obstructive pulmonary disease) (HCC) 10/22/2017   Dysrhythmia    hx of atrial fibrilation   HTN (hypertension)    Hyperlipidemia    Melena    NSTEMI (non-ST elevated myocardial infarction) (HCC) 01/05/2021   NSVT (nonsustained ventricular  tachycardia) (HCC)    Obesity    Polyp of cecum    Polyp of descending colon    Polyp of rectum    Polyp of transverse colon    Shortness of breath      Social History   Socioeconomic History   Marital status: Single    Spouse name: Not on file   Number of children: Not on file   Years of education: Not on file   Highest education level: Not on file  Occupational History   Not on file  Tobacco Use   Smoking status: Former    Types: Cigars    Passive exposure: Never   Smokeless tobacco: Never   Tobacco comments:    1 cigar per month states he stopped smoking  Vaping Use   Vaping status: Never Used  Substance and Sexual Activity   Alcohol use: Yes    Alcohol/week: 3.0 standard drinks of alcohol    Types: 3 Glasses of wine per week   Drug use: No    Comment: 1 per month...pt states he stopped smoking   Sexual activity: Not Currently  Other Topics Concern   Not on file  Social History Narrative   Not on file   Social Drivers of Health   Financial Resource Strain: Low Risk  (02/07/2023)   Overall Financial Resource Strain (CARDIA)    Difficulty of Paying Living Expenses: Not hard at all  Food Insecurity: No Food Insecurity (07/26/2023)   Hunger Vital  Sign    Worried About Programme researcher, broadcasting/film/video in the Last Year: Never true    Ran Out of Food in the Last Year: Never true  Transportation Needs: Unmet Transportation Needs (07/26/2023)   PRAPARE - Administrator, Civil Service (Medical): Yes    Lack of Transportation (Non-Medical): Yes  Physical Activity: Inactive (02/07/2023)   Exercise Vital Sign    Days of Exercise per Week: 0 days    Minutes of Exercise per Session: 0 min  Stress: No Stress Concern Present (02/07/2023)   [employer] of Occupational Health - Occupational Stress Questionnaire    Feeling of Stress : Not at all  Social Connections: Socially Isolated (07/26/2023)   Social Connection and Isolation Panel    Frequency of Communication with  Friends and Family: More than three times a week    Frequency of Social Gatherings with Friends and Family: Once a week    Attends Religious Services: Never    Database administrator or Organizations: No    Attends Engineer, structural: Never    Marital Status: Never married   Education Assessment and Provision:  Detailed education and instructions provided on heart failure disease management including the following:  Signs and symptoms of Heart Failure When to call the physician Importance of daily weights Low sodium diet Fluid restriction Medication management Anticipated future follow-up appointments  Patient education given on each of the above topics.  Patient acknowledges understanding via teach back method and acceptance of all instructions.  Education Materials:  Living Better With Heart Failure Booklet, HF zone tool, & Daily Weight Tracker Tool.  Patient has scale at home: Will buy one at discharge Patient has pill box at home: NA    High Risk Criteria for Readmission and/or Poor Patient Outcomes: Heart failure hospital admissions (last 6 months): 1  No Show rate: 1 Difficult social situation: No, lives with a roommate.  Demonstrates medication adherence: Yes Primary Language: English  Literacy level: Reading, writing, and comprehension.   Barriers of Care:   Diet/ fluid restrictions ( soda/ take out food) Daily weights   Considerations/Referrals:   Referral made to Heart Failure Pharmacist Stewardship: yes Referral made to Heart Failure CSW/NCM TOC: NA Referral made to Heart & Vascular TOC clinic: Yes, HF TOC 08/04/2023 @ 2:45 pm.   Items for Follow-up on DC/TOC: Continued HF education Diet/ fluid restrictions/ daily weights   Stephane Haddock, BSN, RN Heart Failure Teacher, adult education Only

## 2023-07-27 NOTE — Discharge Instructions (Addendum)

## 2023-07-27 NOTE — Telephone Encounter (Signed)
 Patient Advocate Encounter  Test billing for this patients current coverage shows $0 copay for 90 day supply of Jardiance. Assistance not needed for this medication at this time.  Rachel DEL, CPhT Rx Patient Advocate Phone: [phone]

## 2023-07-27 NOTE — Progress Notes (Signed)
   Heart Failure Stewardship Pharmacist Progress Note   PCP: [name], MD PCP-Cardiologist: Oneil Parchment, MD    HPI:  68 yo M with PMH of CHF, afib, CAD, COPD, HTN, and HLD.   Presented to the ED on 6/30 from cardiology office. Complaining of shortness of breath, LE edema, orthopnea, and PND. CXR with mild cardiomegaly and vascular congestion. proBNP elevated. ECHO 7/1 with LVEF 30-35% (stable from 2022), RWMA, RV low normal, moderate MR.   Reports improvement in shortness of breath and edema. Unna boots in place. Reviewed changes to GDMT.   Current HF Medications: Diuretic: furosemide  60 mg IV BID Beta Blocker: carvedilol  12.5 mg BID ACE/ARB/ARNI: Entresto  24/26 mg BID MRA: spironolactone  25 mg daily SGLT2i: Jardiance 10 mg daily Other: digoxin  0.125 mg daily  Prior to admission HF Medications: Diuretic: furosemide  40 mg BID Beta blocker: carvedilol  25 mg BID ACE/ARB/ARNI: Entresto  24/26 mg BID Other: digoxin  0.125 mg daily  Pertinent Lab Values: Serum creatinine 1.11, BUN 16, Potassium 3.9, Sodium 138, proBNP 679, Magnesium  2.1, Digoxin  0.8 (6/30)   Vital Signs: Weight: 243 lbs (admission weight: 260 lbs) Blood pressure: 130/70s  Heart rate: 40-50s  I/O: net -6.9L yesterday; net -9.9L since admission  Medication Assistance / Insurance Benefits Check: Does the patient have prescription insurance?  Yes Type of insurance plan: Medicare + Medicaid  Outpatient Pharmacy:  Prior to admission outpatient pharmacy: [facility] Is the patient willing to use [facility] TOC pharmacy at discharge? Yes Is the patient willing to transition their outpatient pharmacy to utilize a [facility] outpatient pharmacy?   No    Assessment: 1. Acute on chronic systolic CHF (LVEF 30-35%), due to ICM. NYHA class III symptoms. - Continue furosemide  60 mg IV BID. Strict I/Os and daily weights. Keep K>4 and Mg>2. - Continue carvedilol  12.5 mg BID (reduced from PTA dose due to  bradycardia) - Continue Entresto  24/26 mg BID - Agree with increasing spironolactone  to 25 mg daily - Continue Jardiance 10 mg daily - Continue digoxin  0.125 mg daily   Plan: 1) Medication changes recommended at this time: - Agree with changes  2) Patient assistance: - Entresto  copay $0 - Jardiance copay $0  3)  Education  - Initial education completed - Full education to be completed prior to discharge  Duwaine Plant, PharmD, BCPS Heart Failure Engineer, building services Phone [phone]

## 2023-07-28 ENCOUNTER — Other Ambulatory Visit (HOSPITAL_COMMUNITY): Payer: Self-pay

## 2023-07-28 DIAGNOSIS — I4821 Permanent atrial fibrillation: Secondary | ICD-10-CM | POA: Diagnosis not present

## 2023-07-28 DIAGNOSIS — I5023 Acute on chronic systolic (congestive) heart failure: Secondary | ICD-10-CM | POA: Diagnosis not present

## 2023-07-28 DIAGNOSIS — I10 Essential (primary) hypertension: Secondary | ICD-10-CM | POA: Diagnosis not present

## 2023-07-28 LAB — BASIC METABOLIC PANEL WITH GFR
Anion gap: 13 (ref 5–15)
BUN: 22 mg/dL (ref 8–23)
CO2: 28 mmol/L (ref 22–32)
Calcium: 9.4 mg/dL (ref 8.9–10.3)
Chloride: 97 mmol/L — ABNORMAL LOW (ref 98–111)
Creatinine, Ser: 1.21 mg/dL (ref 0.61–1.24)
GFR, Estimated: >60 mL/min (ref >60)
Glucose, Bld: 146 mg/dL — ABNORMAL HIGH (ref 70–99)
Potassium: 3.8 mmol/L (ref 3.5–5.1)
Sodium: 138 mmol/L (ref 135–145)

## 2023-07-28 MED ORDER — EMPAGLIFLOZIN 10 MG PO TABS
10.0000 mg | ORAL_TABLET | Freq: Every day | ORAL | 1 refills | Status: DC
  Filled 2023-07-28: qty 30, 30d supply, fill #0

## 2023-07-28 MED ORDER — SPIRONOLACTONE 25 MG PO TABS
25.0000 mg | ORAL_TABLET | Freq: Every day | ORAL | 1 refills | Status: DC
  Filled 2023-07-28: qty 30, 30d supply, fill #0

## 2023-07-28 NOTE — Progress Notes (Signed)
 Mobility Specialist Progress Note;   07/28/23 1116  Mobility  Activity Ambulated with assistance in hallway  Level of Assistance Standby assist, set-up cues, supervision of patient - no hands on  Assistive Device Cane  Distance Ambulated (ft) 400 ft  Activity Response Tolerated well  Mobility Referral Yes  Mobility visit 1 Mobility  Mobility Specialist Start Time (ACUTE ONLY) 1116  Mobility Specialist Stop Time (ACUTE ONLY) 1132  Mobility Specialist Time Calculation (min) (ACUTE ONLY) 16 min   Pt agreeable to mobility. Required no physical assistance during ambulation, SV. VSS throughout and no c/o when asked. Pt left sitting on EoB with all needs met, call bell in reach.   Lauraine Erm Mobility Specialist Please contact via SecureChat or [employer] [phone]

## 2023-07-28 NOTE — Progress Notes (Signed)
 PROGRESS NOTE    Norman Rodriguez  FMW:[id number] DOB: [birth year] DOA: 07/25/2023 PCP: [name], MD  68/M w/ systolic CHF, PAfib, CAD and COPD who presented with worsening exertional dyspnea and lower extremity edema, along with orthopnea and PND.  -6/30 seen in clinic, sent to ED, Vol overloaded, labs cr 1,0  CXR w bilateral hilar vascular congestion and central interstitial infiltrates bilaterally, small left pleural effusion EKG afib rate controlled  Subjective: Feels better overall, breathing continues to improve  Assessment and Plan:  Acute on chronic systolic CHF -echo 2022  w/ EF 30 to 35%, akinesis of the LV entire inferior wall, inferolateral wall and inferoseptal wall. RV with mild reduction,  -Repeat echo with EF 30-35% with wall motion abnormality, RV low normal, moderate MR Improving with diuresis, 12.9 L negative -Cards following, continue IV Lasix  1 more day, Coreg , Jardiance, Entresto , Aldactone  and digoxin  - Increase activity, discharge planning  History of VT, defibrillator in place,  -keep K at 4 and Mg at 2.  - Continue Coreg   CAD (coronary artery disease) Continue atorvastatin    HTN (hypertension) As above  Permanent atrial fibrillation) (HCC) Atrial fibrillation rhythm Continue carvedilol  and rivaroxaban   Hypokalemia -repleted   COPD (chronic obstructive pulmonary disease) (HCC) No signs of acute exacerbation   Hyperlipidemia Continue with statin therapy   Chronic deep vein thrombosis (DVT) of femoral vein of right lower extremity (HCC) Continue with rivaroxaban .  Obesity, class 3 Calculated BMI 40,7   DVT prophylaxis: xarelto  Code Status: Full code Family Communication: Full Code Disposition Plan: Home likely 1 to 2 days  Consultants:    Procedures:   Antimicrobials:    Objective: Vitals:   07/27/23 2258 07/27/23 2300 07/28/23 0500 07/28/23 0821  BP: (!) 130/56 121/61 138/66 (!) 109/52  Pulse: (!) 51 (!) 47 (!) 47  (!) 46  Resp: 14 18 15 18   Temp: 98.2 F (36.8 C) 98.3 F (36.8 C) 98.1 F (36.7 C) 98.3 F (36.8 C)  TempSrc: Oral Oral Oral Oral  SpO2: 94% 94% 91% 93%  Weight:   113.3 kg   Height:        Intake/Output Summary (Last 24 hours) at 07/28/2023 1010 Last data filed at 07/28/2023 0859 Gross per 24 hour  Intake 241 ml  Output 3450 ml  Net -3209 ml   Filed Weights   07/25/23 1659 07/27/23 0500 07/28/23 0500  Weight: 117.9 kg 110.4 kg 113.3 kg    Examination:  Gen: Awake, Alert, Oriented X 3,  HEENT: Plus JVD Lungs: Good air movement bilaterally, CTAB CVS: S1S2/irregular rhythm Abd: soft, Non tender, non distended, BS present Extremities: Trace edema Skin: no new rashes on exposed skin     Data Reviewed:   CBC: Recent Labs  Lab 07/25/23 1751  WBC 5.2  HGB 13.7  HCT 39.9  MCV 92.4  PLT 181   Basic Metabolic Panel: Recent Labs  Lab 07/25/23 1751 07/26/23 0252 07/27/23 0424 07/28/23 0126  NA 139 139 138 138  K 3.8 3.3* 3.9 3.8  CL 100 100 98 97*  CO2 26 30 25 28   GLUCOSE 128* 106* 115* 146*  BUN 11 12 16 22   CREATININE 1.06 1.00 1.11 1.21  CALCIUM  8.8* 8.4* 9.0 9.4  MG 1.9  --  2.1  --    GFR: Estimated Creatinine Clearance: 68.3 mL/min (by C-G formula based on SCr of 1.21 mg/dL). Liver Function Tests: Recent Labs  Lab 07/25/23 1751  AST 27  ALT 26  ALKPHOS 57  BILITOT 3.1*  PROT 6.6  ALBUMIN 3.3*   No results for input(s): LIPASE, AMYLASE in the last 168 hours. No results for input(s): AMMONIA in the last 168 hours. Coagulation Profile: No results for input(s): INR, PROTIME in the last 168 hours. Cardiac Enzymes: No results for input(s): CKTOTAL, CKMB, CKMBINDEX, TROPONINI in the last 168 hours. BNP (last 3 results) Recent Labs    10/11/22 1620 01/11/23 1500 07/11/23 1539  PROBNP 2,459* 1,520* 679.0*   HbA1C: No results for input(s): HGBA1C in the last 72 hours. CBG: No results for input(s): GLUCAP in the last  168 hours. Lipid Profile: No results for input(s): CHOL, HDL, LDLCALC, TRIG, CHOLHDL, LDLDIRECT in the last 72 hours. Thyroid  Function Tests: No results for input(s): TSH, T4TOTAL, FREET4, T3FREE, THYROIDAB in the last 72 hours. Anemia Panel: No results for input(s): VITAMINB12, FOLATE, FERRITIN, TIBC, IRON, RETICCTPCT in the last 72 hours. Urine analysis:    Component Value Date/Time   COLORURINE YELLOW 07/11/2023 1539   APPEARANCEUR CLEAR 07/11/2023 1539   LABSPEC 1.005 07/11/2023 1539   PHURINE 5.5 07/11/2023 1539   GLUCOSEU NEGATIVE 07/11/2023 1539   HGBUR NEGATIVE 07/11/2023 1539   BILIRUBINUR NEGATIVE 11/01/2008 1522   KETONESUR NEGATIVE 07/11/2023 1539   PROTEINUR NEGATIVE 07/11/2023 1539   UROBILINOGEN 0.2 11/01/2008 1522   NITRITE NEGATIVE 11/01/2008 1522   LEUKOCYTESUR SMALL (A) 11/01/2008 1522   Sepsis Labs: @LABRCNTIP (procalcitonin:4,lacticidven:4)  ) Recent Results (from the past 240 hours)  MRSA Next Gen by PCR, Nasal     Status: None   Collection Time: 07/26/23  3:17 PM   Specimen: Nasal Mucosa; Nasal Swab  Result Value Ref Range Status   MRSA by PCR Next Gen NOT DETECTED NOT DETECTED Final    Comment: (NOTE) The GeneXpert MRSA Assay (FDA approved for NASAL specimens only), is one component of a comprehensive MRSA colonization surveillance program. It is not intended to diagnose MRSA infection nor to guide or monitor treatment for MRSA infections. Test performance is not FDA approved in patients less than 60 years old. Performed at [facility], [address] [place], [place] [address]      Radiology Studies: ECHOCARDIOGRAM COMPLETE Result Date: 07/26/2023    ECHOCARDIOGRAM REPORT   Patient Name:   Norman Rodriguez Date of Exam: 07/26/2023 Medical Rec #:  [id number]         Height:       67.0 in Accession #:    [id number]        Weight:       260.0 lb Date of Birth:  07-28-1955         BSA:          2.261 m Patient  Age:    68 years          BP:           112/76 mmHg Patient Gender: M                 HR:           54 bpm. Exam Location:  Inpatient Procedure: 2D Echo, Color Doppler, Cardiac Doppler and Intracardiac            Opacification Agent (Both Spectral and Color Flow Doppler were            utilized during procedure). Indications:    I50.21 Acute systolic (congestive) heart failure  History:        Patient has prior history of Echocardiogram examinations, most  recent 01/06/2021. CAD, Defibrillator, COPD, Arrythmias:Atrial                 Fibrillation; Risk Factors:Hypertension and Dyslipidemia.  Sonographer:    Damien Senior RDCS Referring Phys: [id number] MOHAMMAD L GARBA IMPRESSIONS  1. No evidence of left ventricular thrombus (Definity  was used). Left ventricular ejection fraction, by estimation, is 30 to 35%. Left ventricular ejection fraction by 2D MOD biplane is 33.7 %. The left ventricle has moderately decreased function. The left ventricle demonstrates regional wall motion abnormalities (see scoring diagram/findings for description). The left ventricular internal cavity size was moderately dilated.  2. Right ventricular systolic function is low normal. The right ventricular size is normal. There is mildly elevated pulmonary artery systolic pressure. The estimated right ventricular systolic pressure is 35.8 mmHg.  3. Left atrial size was severely dilated.  4. Right atrial size was severely dilated.  5. The mitral valve is normal in structure. Moderate mitral valve regurgitation.  6. The aortic valve is tricuspid. There is mild calcification of the aortic valve. There is mild thickening of the aortic valve. Aortic valve regurgitation is not visualized. Aortic valve sclerosis is present, with no evidence of aortic valve stenosis.  7. The inferior vena cava is dilated in size with <50% respiratory variability, suggesting right atrial pressure of 15 mmHg. Comparison(s): Prior images reviewed side by side.  FINDINGS  Left Ventricle: No evidence of left ventricular thrombus (Definity  was used). Left ventricular ejection fraction, by estimation, is 30 to 35%. Left ventricular ejection fraction by 2D MOD biplane is 33.7 %. The left ventricle has moderately decreased function. The left ventricle demonstrates regional wall motion abnormalities. Definity  contrast agent was given IV to delineate the left ventricular endocardial borders. The left ventricular internal cavity size was moderately dilated. There is no left ventricular hypertrophy. Abnormal (paradoxical) septal motion, consistent with left bundle branch block. Left ventricular diastolic function could not be evaluated due to atrial fibrillation.  LV Wall Scoring: The inferior wall, posterior wall, mid inferoseptal segment, and basal inferoseptal segment are akinetic. The apical inferior segment is hypokinetic. The entire anterior wall, antero-lateral wall, entire anterior septum, apical lateral segment, and apex are normal. Right Ventricle: The right ventricular size is normal. No increase in right ventricular wall thickness. Right ventricular systolic function is low normal. There is mildly elevated pulmonary artery systolic pressure. The tricuspid regurgitant velocity is 2.28 m/s, and with an assumed right atrial pressure of 15 mmHg, the estimated right ventricular systolic pressure is 35.8 mmHg. Left Atrium: Left atrial size was severely dilated. Right Atrium: Right atrial size was severely dilated. Pericardium: Trivial pericardial effusion is present. Mitral Valve: The mitral valve is normal in structure. Moderate mitral valve regurgitation, with eccentric laterally directed jet. Tricuspid Valve: The tricuspid valve is normal in structure. Tricuspid valve regurgitation is trivial. Aortic Valve: The aortic valve is tricuspid. There is mild calcification of the aortic valve. There is mild thickening of the aortic valve. Aortic valve regurgitation is not  visualized. Aortic valve sclerosis is present, with no evidence of aortic valve stenosis. Pulmonic Valve: The pulmonic valve was normal in structure. Pulmonic valve regurgitation is not visualized. Aorta: The aortic root and ascending aorta are structurally normal, with no evidence of dilitation. Venous: The inferior vena cava is dilated in size with less than 50% respiratory variability, suggesting right atrial pressure of 15 mmHg. IAS/Shunts: No atrial level shunt detected by color flow Doppler. Additional Comments: A device lead is visualized in the right ventricle.  LEFT  VENTRICLE PLAX 2D                        Biplane EF (MOD) LVIDd:         6.00 cm         LV Biplane EF:   Left LVIDs:         5.80 cm                          ventricular LV PW:         0.60 cm                          ejection LV IVS:        0.70 cm                          fraction by LVOT diam:     2.30 cm                          2D MOD LVOT Area:     4.15 cm                         biplane is                                                 33.7 %.  LV Volumes (MOD) LV vol d, MOD    233.0 ml A2C: LV vol d, MOD    224.0 ml A4C: LV vol s, MOD    152.0 ml A2C: LV vol s, MOD    154.0 ml A4C: LV SV MOD A2C:   81.0 ml LV SV MOD A4C:   224.0 ml LV SV MOD BP:    76.8 ml RIGHT VENTRICLE RV S prime:     13.80 cm/s TAPSE (M-mode): 1.6 cm LEFT ATRIUM              Index        RIGHT ATRIUM           Index LA diam:        4.90 cm  2.17 cm/m   RA Area:     24.10 cm LA Vol (A2C):   98.9 ml  43.75 ml/m  RA Volume:   81.10 ml  35.87 ml/m LA Vol (A4C):   100.0 ml 44.23 ml/m LA Biplane Vol: 106.0 ml 46.89 ml/m   AORTA Ao Root diam: 2.90 cm Ao Asc diam:  3.10 cm TRICUSPID VALVE TR Peak grad:   20.8 mmHg TR Vmax:        228.00 cm/s  SHUNTS Systemic Diam: 2.30 cm Mihai Croitoru MD Electronically signed by Jerel Balding MD Signature Date/Time: 07/26/2023/4:31:19 PM    Final      Scheduled Meds:  amiodarone   200 mg Oral Daily   atorvastatin   40 mg Oral  Daily   carvedilol   12.5 mg Oral BID   digoxin   125 mcg Oral Daily   empagliflozin  10 mg Oral Daily   furosemide   60 mg Intravenous BID   rivaroxaban   20 mg Oral Q supper   sacubitril -valsartan   1 tablet Oral BID   sodium chloride  flush  3 mL Intravenous Q12H   spironolactone   25 mg Oral Daily   Continuous Infusions:     LOS: 3 days    Time spent:    Sigurd Pac, MD [facility]   07/28/2023, 10:10 AM

## 2023-07-28 NOTE — Progress Notes (Signed)
  Progress Note  Patient Name: JAIMIE REDDITT Date of Encounter: 07/28/2023 [facility] Cardiologist: Oneil Parchment, MD   Interval Summary    No complaints  Vital Signs Vitals:   07/27/23 1955 07/27/23 2258 07/27/23 2300 07/28/23 0500  BP: 105/61 (!) 130/56 121/61 138/66  Pulse: (!) 52 (!) 51 (!) 47 (!) 47  Resp: 16 14 18 15   Temp: 98.4 F (36.9 C) 98.2 F (36.8 C) 98.3 F (36.8 C) 98.1 F (36.7 C)  TempSrc: Oral Oral Oral Oral  SpO2: 92% 94% 94% 91%  Weight:    113.3 kg  Height:        Intake/Output Summary (Last 24 hours) at 07/28/2023 9196 Last data filed at 07/28/2023 0500 Gross per 24 hour  Intake 361 ml  Output 3700 ml  Net -3339 ml      07/28/2023    5:00 AM 07/27/2023    5:00 AM 07/25/2023    4:59 PM  Last 3 Weights  Weight (lbs) 249 lb 11.2 oz 243 lb 6.2 oz 260 lb  Weight (kg) 113.263 kg 110.4 kg 117.935 kg      Telemetry/ECG   Atrial fib - Personally Reviewed  Physical Exam  GEN: NAD Cardiac: Irreg irreg,no murmurs Respiratory: clear bilat GI: soft MS: Unna boots bilat  Assessment & Plan   68 y.o. male with a hx of permanent atrial fibrillation (failed amiodarone  and Tikosyn ), CAD s/p PCI to LAD, hypertension, chronic HFrEF s/p ICD, history of VT, COPD, hyperlipidemia who was seen 07/26/2023 for the evaluation of acute on chronic HFrEF at the request of Dr. Arrien.   Acute on Chronic HFrEF: LVEF 30-35%, unchanged. He has diuresed well with IV Lasix . Now net negative 13 liters. Renal function stable with diuresis. He has improvement in his LE edema and abdominal bloating. I think he is still volume overloaded and would benefit from another day of diuresis with IV lasix .  -Continue IV Lasix  today.  -Continue Coreg , Jardiance, Entresto  and spironolactone .   Permanent atrial fibrillation: Rate controlled. Continue Coreg , amiodarone  and Xarelto .   HTN: BP controlled. No changes  For questions or updates, please contact [facility] Please consult [email or web address] for contact info under   Lonni Cash, MD, [facility] 07/28/2023 8:03 AM

## 2023-07-28 NOTE — Evaluation (Signed)
 Physical Therapy Evaluation Patient Details Name: Norman Rodriguez MRN: [id number] DOB: [birth year] Today's Date: 07/28/2023  History of Present Illness  Pt is a 68 y.o. male who presented as referral by his cardiologist 07/25/23 with fluid overload. Pt admitted with CHF exacerbation. PMH: arthritis, afib, CAD s/p PCI to LAD, chronic systolic CHF s/p ICD, COPD, HTN, HLD, NSTEMI, NSVT, obesity   Clinical Impression  Pt presents with condition above and deficits mentioned below, see PT Problem List. PTA, he was living with a roommate on the bottom floor of a multi-story house, which is owned by his elderly landlord that lives on the floor above him. He has a flight of stairs to descend to enter his part of the house. At baseline, the pt is mod I for functional mobility, intermittently not utilizing his hurrycane inside but often using it for longer distance mobility in the community. Currently, the pt appears to be functioning near his baseline, ambulating with his cane with supervision for safety only. He did have to stop and rest while standing 1x when he suddenly became dizzy, but he reports this happens at baseline. The pt was primarily concerned about housing stability and social support once his elderly landlord, who is in his 90s, eventually passes away. Discussed in great detail retirement apartment, ILF, ALF, vs SNF options and that the pt is not at the level to need skilled nursing care yet at an ALF or SNF. Pt in agreement. As the pt is near his functional baseline, no skilled post acute PT services needed. Pt in agreement. Will continue to follow acutely to further educate pt on managing his CHF, progressing his mobility, and improving his safety. Already began educating pt on monitoring his weight and making diet changes to manage his CHF.      If plan is discharge home, recommend the following: Assistance with cooking/housework;Assist for transportation;Help with stairs or ramp for entrance    Can travel by private vehicle        Equipment Recommendations None recommended by PT  Recommendations for Other Services       Functional Status Assessment Patient has had a recent decline in their functional status and demonstrates the ability to make significant improvements in function in a reasonable and predictable amount of time.     Precautions / Restrictions Precautions Precautions: Fall Restrictions Weight Bearing Restrictions Per Provider Order: No      Mobility  Bed Mobility               General bed mobility comments: Pt sitting EOB upon arrival and at end of session.    Transfers Overall transfer level: Modified independent Equipment used: Straight cane, None Transfers: Sit to/from Stand Sit to Stand: Modified independent (Device/Increase time)           General transfer comment: Pt able to come to stand multiple times from EOB to [facility] or no AD without LOB, slightly increased time    Ambulation/Gait Ambulation/Gait assistance: Supervision Gait Distance (Feet): 270 Feet Assistive device: Straight cane Gait Pattern/deviations: Step-through pattern, Decreased stride length Gait velocity: reduced Gait velocity interpretation: <1.8 ft/sec, indicate of risk for recurrent falls   General Gait Details: Pt took x1 standing break when he suddenly became a little dizzy, reporting this occurs at baseline. It quickly resolved. No overt LOB noted, Supervision for safety  Stairs            Wheelchair Mobility     Tilt Bed    Modified Rankin (Stroke  Patients Only)       Balance Overall balance assessment: Mild deficits observed, not formally tested                                           Pertinent Vitals/Pain Pain Assessment Pain Assessment: Faces Faces Pain Scale: Hurts a little bit Pain Location: R heel Pain Descriptors / Indicators: Discomfort Pain Intervention(s): Monitored during session, Limited activity  within patient's tolerance    Home Living Family/patient expects to be discharged to:: Private residence Living Arrangements: Non-relatives/Friends Available Help at Discharge: Friend(s);Family;Available PRN/intermittently (no one available at night) Type of Home: House Home Access: Stairs to enter Entrance Stairs-Rails: Right;Left;Can reach both Entrance Stairs-Number of Steps: flight (down to his floor where his bed/bathroom are)   Home Layout: Multi-level;Other (Comment) (bed/bath downstairs; pt stays on bottom floor, upstairs is land lord's home) Home Equipment: Cane - single point (hurrycane)      Prior Function Prior Level of Function : Independent/Modified Independent             Mobility Comments: Uses cane in R hand and holds onto rail on stairs with L hand; mod I using cane as needed for longer distances, no AD often in home ADLs Comments: Calls [employer] for transport to appointments. Calls for delivery of groceries as needed, but will get personal driver to go to grocery store     Extremity/Trunk Assessment   Upper Extremity Assessment Upper Extremity Assessment: Overall WFL for tasks assessed    Lower Extremity Assessment Lower Extremity Assessment: Overall WFL for tasks assessed    Cervical / Trunk Assessment Cervical / Trunk Assessment: Other exceptions Cervical / Trunk Exceptions: increased body habitus  Communication   Communication Communication: No apparent difficulties    Cognition Arousal: Alert Behavior During Therapy: WFL for tasks assessed/performed   PT - Cognitive impairments: No apparent impairments                         Following commands: Intact       Cueing Cueing Techniques: Verbal cues     General Comments General comments (skin integrity, edema, etc.): Educated pt on managing his CHF by reducing sodium/processed foods intake, rinsing canned fruit/veggies and preferrably eating fresh/frozen fruits/veggies  instead, and weighing self daily. Had long discussion about retirement apartments, ILF, ALF, vs SNF per pt request to discuss next steps when appropriate.    Exercises     Assessment/Plan    PT Assessment Patient needs continued PT services  PT Problem List Decreased activity tolerance;Decreased balance;Decreased mobility;Obesity;Cardiopulmonary status limiting activity       PT Treatment Interventions DME instruction;Gait training;Stair training;Functional mobility training;Therapeutic activities;Therapeutic exercise;Balance training;Neuromuscular re-education;Patient/family education    PT Goals (Current goals can be found in the Care Plan section)  Acute Rehab PT Goals Patient Stated Goal: to make sure he has support and good housing as needed once his landlord passes away PT Goal Formulation: With patient Time For Goal Achievement: 08/11/23 Potential to Achieve Goals: Good    Frequency Min 1X/week     Co-evaluation               AM-PAC PT 6 Clicks Mobility  Outcome Measure Help needed turning from your back to your side while in a flat bed without using bedrails?: None Help needed moving from lying on your back to sitting on  the side of a flat bed without using bedrails?: None Help needed moving to and from a bed to a chair (including a wheelchair)?: None Help needed standing up from a chair using your arms (e.g., wheelchair or bedside chair)?: None Help needed to walk in hospital room?: A Little Help needed climbing 3-5 steps with a railing? : A Little 6 Click Score: 22    End of Session   Activity Tolerance: Patient tolerated treatment well Patient left: in bed;with call bell/phone within reach (sitting EOB without bed alarm on as per arrival)   PT Visit Diagnosis: Unsteadiness on feet (R26.81);Other abnormalities of gait and mobility (R26.89)    Time: 0908-1000 PT Time Calculation (min) (ACUTE ONLY): 52 min   Charges:   PT Evaluation $PT Eval Moderate  Complexity: 1 Mod PT Treatments $Gait Training: 8-22 mins $Therapeutic Activity: 8-22 mins PT General Charges $$ ACUTE PT VISIT: 1 Visit         Theo Ferretti, PT, DPT Acute Rehabilitation Services  Office: [phone]   Theo CHRISTELLA Ferretti 07/28/2023, 10:51 AM

## 2023-07-28 NOTE — Care Management Important Message (Signed)
 Important Message  Patient Details  Name: Norman Rodriguez MRN: [id number] Date of Birth: 11/02/1955   Important Message Given:        Claretta Deed 07/28/2023, 2:03 PM

## 2023-07-28 NOTE — Progress Notes (Signed)
   Heart Failure Stewardship Pharmacist Progress Note   PCP: [name], MD PCP-Cardiologist: Oneil Parchment, MD    HPI:  68 yo M with PMH of CHF, afib, CAD, COPD, HTN, and HLD.   Presented to the ED on 6/30 from cardiology office. Complaining of shortness of breath, LE edema, orthopnea, and PND. CXR with mild cardiomegaly and vascular congestion. proBNP elevated. ECHO 7/1 with LVEF 30-35% (stable from 2022), RWMA, RV low normal, moderate MR.   Denies shortness of breath. States he has been up walking the halls and has felt well. Says he needed to stop once due to dizziness. Reports improvement in edema. Unna boots in place. Reviewed changes to GDMT and goals of therapy. Interested in [facility] pharmacy for discharge. Uses [facility] and they deliver his medications to his home.   Current HF Medications: Diuretic: furosemide  60 mg IV BID Beta Blocker: carvedilol  12.5 mg BID ACE/ARB/ARNI: Entresto  24/26 mg BID MRA: spironolactone  25 mg daily SGLT2i: Jardiance 10 mg daily Other: digoxin  0.125 mg daily  Prior to admission HF Medications: Diuretic: furosemide  40 mg BID Beta blocker: carvedilol  25 mg BID ACE/ARB/ARNI: Entresto  24/26 mg BID Other: digoxin  0.125 mg daily  Pertinent Lab Values: Serum creatinine 1.21, BUN 22, Potassium 3.8, Sodium 138, proBNP 679, Magnesium  2.1, Digoxin  0.8 (6/30)   Vital Signs: Weight: 249 lbs (admission weight: 260 lbs) Blood pressure: 110-130/60s  Heart rate: 40-50s  I/O: net -3.1L yesterday; net -13.1L since admission  Medication Assistance / Insurance Benefits Check: Does the patient have prescription insurance?  Yes Type of insurance plan: Medicare + Medicaid  Outpatient Pharmacy:  Prior to admission outpatient pharmacy: [facility] Is the patient willing to use [facility] pharmacy at discharge? Yes Is the patient willing to transition their outpatient pharmacy to utilize a [facility] outpatient pharmacy?   No     Assessment: 1. Acute on chronic systolic CHF (LVEF 30-35%), due to ICM. NYHA class III symptoms. - Continue furosemide  60 mg IV BID. Strict I/Os and daily weights. Keep K>4 and Mg>2. - Continue carvedilol  12.5 mg BID (reduced from PTA dose due to bradycardia) - Continue Entresto  24/26 mg BID - Continue spironolactone  25 mg daily - Continue Jardiance 10 mg daily - Continue digoxin  0.125 mg daily   Plan: 1) Medication changes recommended at this time: - Continue IV lasix   2) Patient assistance: - Entresto  copay $0 - Jardiance copay $0  3)  Education  - Patient has been educated on current HF medications and potential additions to HF medication regimen - Patient verbalizes understanding that over the next few months, these medication doses may change and more medications may be added to optimize HF regimen - Patient has been educated on basic disease state pathophysiology and goals of therapy   Duwaine Plant, PharmD, BCPS Heart Failure Stewardship Pharmacist Phone [phone]

## 2023-07-29 DIAGNOSIS — I4821 Permanent atrial fibrillation: Secondary | ICD-10-CM | POA: Diagnosis not present

## 2023-07-29 DIAGNOSIS — I5023 Acute on chronic systolic (congestive) heart failure: Secondary | ICD-10-CM | POA: Diagnosis not present

## 2023-07-29 LAB — BASIC METABOLIC PANEL WITH GFR
Anion gap: 14 (ref 5–15)
BUN: 28 mg/dL — ABNORMAL HIGH (ref 8–23)
CO2: 23 mmol/L (ref 22–32)
Calcium: 9.3 mg/dL (ref 8.9–10.3)
Chloride: 98 mmol/L (ref 98–111)
Creatinine, Ser: 1.04 mg/dL (ref 0.61–1.24)
GFR, Estimated: >60 mL/min (ref >60)
Glucose, Bld: 107 mg/dL — ABNORMAL HIGH (ref 70–99)
Potassium: 4.1 mmol/L (ref 3.5–5.1)
Sodium: 135 mmol/L (ref 135–145)

## 2023-07-29 MED ORDER — FUROSEMIDE 40 MG PO TABS
40.0000 mg | ORAL_TABLET | Freq: Two times a day (BID) | ORAL | Status: DC
  Administered 2023-07-30: 40 mg via ORAL
  Filled 2023-07-29: qty 1

## 2023-07-29 NOTE — Progress Notes (Signed)
 PROGRESS NOTE    Norman Rodriguez  FMW:[id number] DOB: [birth year] DOA: 07/25/2023 PCP: [name], MD  68/M w/ systolic CHF, PAfib, CAD and COPD who presented with worsening exertional dyspnea and lower extremity edema, along with orthopnea and PND.  -6/30 seen in clinic, sent to ED, Vol overloaded, labs cr 1,0  CXR w bilateral hilar vascular congestion and central interstitial infiltrates bilaterally, small left pleural effusion EKG afib rate controlled  Subjective: Feels better overall, breathing continues to improve  Assessment and Plan:  Acute on chronic systolic CHF -echo 2022  w/ EF 30 to 35%, akinesis of the LV entire inferior wall, inferolateral wall and inferoseptal wall. RV with mild reduction,  -Repeat echo with EF 30-35% with wall motion abnormality, RV low normal, moderate MR Improving with diuresis, 15 L negative, weight down 26 LB -Cards following, remains on IV Lasix , Coreg , Jardiance , Entresto , Aldactone  and digoxin  - ?  Switch to p.o. diuretics today, increase activity, discharge planning  History of VT, defibrillator in place,  -keep K at 4 and Mg at 2.  - Continue Coreg   CAD (coronary artery disease) Continue atorvastatin    HTN (hypertension) As above  Permanent atrial fibrillation) (HCC) Atrial fibrillation rhythm Continue carvedilol  and rivaroxaban   Hypokalemia -repleted   COPD (chronic obstructive pulmonary disease) (HCC) No signs of acute exacerbation   Hyperlipidemia Continue with statin therapy   Chronic deep vein thrombosis (DVT) of femoral vein of right lower extremity (HCC) Continue with rivaroxaban .  Obesity, class 3 Calculated BMI 40,7   DVT prophylaxis: xarelto  Code Status: Full code Family Communication: Full Code Disposition Plan: Home likely [facility]  Consultants:    Procedures:   Antimicrobials:    Objective: Vitals:   07/28/23 2320 07/29/23 0256 07/29/23 0402 07/29/23 0749  BP: (!) 117/45 (!) 112/57  (!)  92/51  Pulse: (!) 50 (!) 50  60  Resp: 17 16  16   Temp: 98.2 F (36.8 C) 98.3 F (36.8 C)  98.3 F (36.8 C)  TempSrc: Oral Oral  Oral  SpO2: 94% 93%  93%  Weight:   105.7 kg   Height:        Intake/Output Summary (Last 24 hours) at 07/29/2023 1032 Last data filed at 07/29/2023 0752 Gross per 24 hour  Intake 598 ml  Output 2750 ml  Net -2152 ml   Filed Weights   07/27/23 0500 07/28/23 0500 07/29/23 0402  Weight: 110.4 kg 113.3 kg 105.7 kg    Examination:  Gen: Awake, Alert, Oriented X 3,  HEENT: Neck obese unable to assess JVD Lungs: Decreased at the bases otherwise clear CVS: S1S2/irregular rhythm Abd: soft, Non tender, non distended, BS present Extremities: Trace edema Skin: no new rashes on exposed skin     Data Reviewed:   CBC: Recent Labs  Lab 07/25/23 1751  WBC 5.2  HGB 13.7  HCT 39.9  MCV 92.4  PLT 181   Basic Metabolic Panel: Recent Labs  Lab 07/25/23 1751 07/26/23 0252 07/27/23 0424 07/28/23 0126 07/29/23 0256  NA 139 139 138 138 135  K 3.8 3.3* 3.9 3.8 4.1  CL 100 100 98 97* 98  CO2 26 30 25 28 23   GLUCOSE 128* 106* 115* 146* 107*  BUN 11 12 16 22  28*  CREATININE 1.06 1.00 1.11 1.21 1.04  CALCIUM  8.8* 8.4* 9.0 9.4 9.3  MG 1.9  --  2.1  --   --    GFR: Estimated Creatinine Clearance: 76.6 mL/min (by C-G formula based on SCr of  1.04 mg/dL). Liver Function Tests: Recent Labs  Lab 07/25/23 1751  AST 27  ALT 26  ALKPHOS 57  BILITOT 3.1*  PROT 6.6  ALBUMIN 3.3*   No results for input(s): LIPASE, AMYLASE in the last 168 hours. No results for input(s): AMMONIA in the last 168 hours. Coagulation Profile: No results for input(s): INR, PROTIME in the last 168 hours. Cardiac Enzymes: No results for input(s): CKTOTAL, CKMB, CKMBINDEX, TROPONINI in the last 168 hours. BNP (last 3 results) Recent Labs    10/11/22 1620 01/11/23 1500 07/11/23 1539  PROBNP 2,459* 1,520* 679.0*   HbA1C: No results for input(s):  HGBA1C in the last 72 hours. CBG: No results for input(s): GLUCAP in the last 168 hours. Lipid Profile: No results for input(s): CHOL, HDL, LDLCALC, TRIG, CHOLHDL, LDLDIRECT in the last 72 hours. Thyroid  Function Tests: No results for input(s): TSH, T4TOTAL, FREET4, T3FREE, THYROIDAB in the last 72 hours. Anemia Panel: No results for input(s): VITAMINB12, FOLATE, FERRITIN, TIBC, IRON, RETICCTPCT in the last 72 hours. Urine analysis:    Component Value Date/Time   COLORURINE YELLOW 07/11/2023 1539   APPEARANCEUR CLEAR 07/11/2023 1539   LABSPEC 1.005 07/11/2023 1539   PHURINE 5.5 07/11/2023 1539   GLUCOSEU NEGATIVE 07/11/2023 1539   HGBUR NEGATIVE 07/11/2023 1539   BILIRUBINUR NEGATIVE 11/01/2008 1522   KETONESUR NEGATIVE 07/11/2023 1539   PROTEINUR NEGATIVE 07/11/2023 1539   UROBILINOGEN 0.2 11/01/2008 1522   NITRITE NEGATIVE 11/01/2008 1522   LEUKOCYTESUR SMALL (A) 11/01/2008 1522   Sepsis Labs: @LABRCNTIP (procalcitonin:4,lacticidven:4)  ) Recent Results (from the past 240 hours)  MRSA Next Gen by PCR, Nasal     Status: None   Collection Time: 07/26/23  3:17 PM   Specimen: Nasal Mucosa; Nasal Swab  Result Value Ref Range Status   MRSA by PCR Next Gen NOT DETECTED NOT DETECTED Final    Comment: (NOTE) The GeneXpert MRSA Assay (FDA approved for NASAL specimens only), is one component of a comprehensive MRSA colonization surveillance program. It is not intended to diagnose MRSA infection nor to guide or monitor treatment for MRSA infections. Test performance is not FDA approved in patients less than 36 years old. Performed at [facility], [address], [place], [place] [address]      Radiology Studies: No results found.    Scheduled Meds:  amiodarone   200 mg Oral Daily   atorvastatin   40 mg Oral Daily   carvedilol   12.5 mg Oral BID   digoxin   125 mcg Oral Daily   empagliflozin   10 mg Oral Daily   furosemide   60 mg  Intravenous BID   rivaroxaban   20 mg Oral Q supper   sacubitril -valsartan   1 tablet Oral BID   sodium chloride  flush  3 mL Intravenous Q12H   spironolactone   25 mg Oral Daily   Continuous Infusions:     LOS: 4 days    Time spent:    Sigurd Pac, MD [facility]   07/29/2023, 10:32 AM

## 2023-07-29 NOTE — Plan of Care (Signed)

## 2023-07-29 NOTE — Progress Notes (Signed)
  Progress Note  Patient Name: Norman Rodriguez Date of Encounter: 07/29/2023 [facility] Cardiologist: Oneil Parchment, MD   Interval Summary    Feels better. Dyspnea resolved.   Vital Signs Vitals:   07/28/23 2320 07/29/23 0256 07/29/23 0402 07/29/23 0749  BP: (!) 117/45 (!) 112/57  (!) 92/51  Pulse: (!) 50 (!) 50  60  Resp: 17 16  16   Temp: 98.2 F (36.8 C) 98.3 F (36.8 C)  98.3 F (36.8 C)  TempSrc: Oral Oral  Oral  SpO2: 94% 93%  93%  Weight:   105.7 kg   Height:        Intake/Output Summary (Last 24 hours) at 07/29/2023 9191 Last data filed at 07/29/2023 9247 Gross per 24 hour  Intake 838 ml  Output 3450 ml  Net -2612 ml      07/29/2023    4:02 AM 07/28/2023    5:00 AM 07/27/2023    5:00 AM  Last 3 Weights  Weight (lbs) 233 lb 0.4 oz 249 lb 11.2 oz 243 lb 6.2 oz  Weight (kg) 105.7 kg 113.263 kg 110.4 kg      Telemetry/ECG   Atrial fib - Personally Reviewed  Physical Exam  GEN: NAD, Alert Cardiac: Irreg irreg Respiratory: clear bilat GI: soft, NT MS: minimal LE edema  Assessment & Plan   68 y.o. male with a hx of permanent atrial fibrillation (failed amiodarone  and Tikosyn ), CAD s/p PCI to LAD, hypertension, chronic HFrEF s/p ICD, history of VT, COPD, hyperlipidemia who was seen 07/26/2023 for the evaluation of acute on chronic HFrEF at the request of Dr. Arrien.   Acute on Chronic HFrEF: LVEF 30-35%, unchanged. He has diuresed net negative 15.5 liters since admission. He is feeling much better. Renal function stable.  Would stop IV Lasix  today. (He got the am dose). Change to po Lasix . Discharge home later today or tomorrow. Would discharge home on Lasix  40 mg po BID.  Continue Coreg , Jardiance , Entresto  and spiro .   Permanent atrial fibrillation: Rate controlled. Continue Coreg , amio and Xarelto   HTN: BP controlled. No changes  For questions or updates, please contact [facility] Please consult [email or web address] for contact info under    Lonni Cash, MD, [facility] 07/29/2023 8:08 AM

## 2023-07-29 NOTE — Progress Notes (Signed)
 Mobility Specialist Progress Note:    07/29/23 1122  Mobility  Activity Ambulated with assistance in hallway  Level of Assistance Standby assist, set-up cues, supervision of patient - no hands on  Assistive Device Cane  Distance Ambulated (ft) 400 ft  Activity Response Tolerated well  Mobility Referral Yes  Mobility visit 1 Mobility  Mobility Specialist Start Time (ACUTE ONLY) 1029  Mobility Specialist Stop Time (ACUTE ONLY) 1045  Mobility Specialist Time Calculation (min) (ACUTE ONLY) 16 min   Received pt in bed and agreeable to mobility. No physical assistance needed. C/o dizziness, checked BP at EOS, 109/59 (75). Returned to room without fault. Left pt EOB. Personal belongings and call light within reach. All needs met.   Lavanda Pollack Mobility Specialist  Please contact via [employer] or  Rehab Office [phone]

## 2023-07-29 NOTE — Plan of Care (Signed)

## 2023-07-29 NOTE — Care Management Important Message (Signed)
 Important Message  Patient Details  Name: Norman Rodriguez MRN: [id number] Date of Birth: Mar 03, 1955   Important Message Given:  Yes - Medicare IM     Claretta Deed 07/29/2023, 12:50 PM

## 2023-07-29 NOTE — Progress Notes (Signed)
 Physical Therapy Treatment Patient Details Name: Norman Rodriguez MRN: [id number] DOB: [birth year] Today's Date: 07/29/2023   History of Present Illness Pt is a 68 y.o. male who presented as referral by his cardiologist 07/25/23 with fluid overload. Pt admitted with CHF exacerbation. PMH: arthritis, afib, CAD s/p PCI to LAD, chronic systolic CHF s/p ICD, COPD, HTN, HLD, NSTEMI, NSVT, obesity    PT Comments  Pt is progressing well towards goals. Continues at supervision for gait with SPC and CGA for stairs per home set up. Pt educated on correct way but has a way he has been navigating stairs for years that he does well with despite using the unaffected leg to descend with first. Educated on speaking with MD about possible OPPT for cervicogenic dizziness and provided with chin tuck exercise to start for ROM and stretching. Due to pt current functional status, home set up and available assistance at home no recommended skilled physical therapy services at this time on discharge from acute care hospital setting. Will continue to follow in acute setting in order to ensure that pt returns home with decreased risk for falls, injury, re-hospitalization and improved activity tolerance.      If plan is discharge home, recommend the following: Assistance with cooking/housework;Assist for transportation;Help with stairs or ramp for entrance     Equipment Recommendations  None recommended by PT       Precautions / Restrictions Precautions Precautions: Fall Recall of Precautions/Restrictions: Intact Restrictions Weight Bearing Restrictions Per Provider Order: No     Mobility  Bed Mobility Overal bed mobility: Modified Independent      Transfers Overall transfer level: Modified independent Equipment used: Straight cane, None Transfers: Sit to/from Stand Sit to Stand: Modified independent (Device/Increase time)           General transfer comment: Pt able to come to stand multiple times from  EOB to [facility] or no AD without LOB, slightly increased time    Ambulation/Gait Ambulation/Gait assistance: Supervision Gait Distance (Feet): 400 Feet Assistive device: Straight cane Gait Pattern/deviations: Step-through pattern, Decreased stride length Gait velocity: reduced Gait velocity interpretation: <1.8 ft/sec, indicate of risk for recurrent falls   General Gait Details: pt reports dizziness with gait at baseline that comes on with pain in the L posterior cervical area   Stairs Stairs: Yes Stairs assistance: Contact guard assist Stair Management: One rail Right, One rail Left, Step to pattern, Forwards Number of Stairs: 4 General stair comments: CGA for safety due to instability pt has his own way of going up/down stairs. Educated on correct way but pt does well at [facility] for his own way      Balance Overall balance assessment: Mild deficits observed, not formally tested       Communication Communication Communication: No apparent difficulties  Cognition Arousal: Alert Behavior During Therapy: WFL for tasks assessed/performed   PT - Cognitive impairments: No apparent impairments     PT - Cognition Comments: hyperverbal Following commands: Intact      Cueing Cueing Techniques: Verbal cues     General Comments General comments (skin integrity, edema, etc.): pt educated on cervicogenic dizziness and given chin tucks with demonstration and return demonstration. Educated on out patient PT for cervicogenic dizziness      Pertinent Vitals/Pain Pain Assessment Pain Assessment: Faces Faces Pain Scale: Hurts a little bit Pain Location: R heel Pain Descriptors / Indicators: Discomfort Pain Intervention(s): Monitored during session, Limited activity within patient's tolerance     PT Goals (current goals can  now be found in the care plan section) Acute Rehab PT Goals Patient Stated Goal: to make sure he has support and good housing as needed once his landlord passes  away PT Goal Formulation: With patient Time For Goal Achievement: 08/11/23 Potential to Achieve Goals: Good Progress towards PT goals: Progressing toward goals    Frequency    Min 1X/week      PT Plan  Continue with current POC        AM-PAC PT 6 Clicks Mobility   Outcome Measure  Help needed turning from your back to your side while in a flat bed without using bedrails?: None Help needed moving from lying on your back to sitting on the side of a flat bed without using bedrails?: None Help needed moving to and from a bed to a chair (including a wheelchair)?: None Help needed standing up from a chair using your arms (e.g., wheelchair or bedside chair)?: None Help needed to walk in hospital room?: A Little Help needed climbing 3-5 steps with a railing? : A Little 6 Click Score: 22    End of Session Equipment Utilized During Treatment: Gait belt Activity Tolerance: Patient tolerated treatment well Patient left: in bed;with call bell/phone within reach Nurse Communication: Mobility status PT Visit Diagnosis: Unsteadiness on feet (R26.81);Other abnormalities of gait and mobility (R26.89)     Time: 8885-8862 PT Time Calculation (min) (ACUTE ONLY): 23 min  Charges:    $Gait Training: 8-22 mins $Therapeutic Activity: 8-22 mins PT General Charges $$ ACUTE PT VISIT: 1 Visit                     Norman Rodriguez, DPT, CLT  Acute Rehabilitation Services Office: [phone] (Secure chat preferred)    Norman VEAR Rodriguez 07/29/2023, 11:43 AM

## 2023-07-30 ENCOUNTER — Other Ambulatory Visit (HOSPITAL_COMMUNITY): Payer: Self-pay

## 2023-07-30 DIAGNOSIS — I5023 Acute on chronic systolic (congestive) heart failure: Secondary | ICD-10-CM | POA: Diagnosis not present

## 2023-07-30 LAB — BASIC METABOLIC PANEL WITH GFR
Anion gap: 11 (ref 5–15)
BUN: 31 mg/dL — ABNORMAL HIGH (ref 8–23)
CO2: 26 mmol/L (ref 22–32)
Calcium: 9.3 mg/dL (ref 8.9–10.3)
Chloride: 99 mmol/L (ref 98–111)
Creatinine, Ser: 1.22 mg/dL (ref 0.61–1.24)
GFR, Estimated: >60 mL/min
Glucose, Bld: 138 mg/dL — ABNORMAL HIGH (ref 70–99)
Potassium: 3.9 mmol/L (ref 3.5–5.1)
Sodium: 136 mmol/L (ref 135–145)

## 2023-07-30 MED ORDER — EMPAGLIFLOZIN 10 MG PO TABS: 10.0000 mg | ORAL_TABLET | Freq: Every day | ORAL | 1 refills | Status: DC

## 2023-07-30 MED ORDER — CARVEDILOL 25 MG PO TABS: 12.5000 mg | ORAL_TABLET | Freq: Two times a day (BID) | ORAL | Status: DC

## 2023-07-30 MED ORDER — AMIODARONE HCL 200 MG PO TABS: 200.0000 mg | ORAL_TABLET | Freq: Every day | ORAL | Status: DC

## 2023-07-30 MED ORDER — FUROSEMIDE 40 MG PO TABS
40.0000 mg | ORAL_TABLET | Freq: Two times a day (BID) | ORAL | 2 refills | Status: DC
Start: 2023-07-30 — End: 2023-10-28

## 2023-07-30 MED ORDER — SPIRONOLACTONE 25 MG PO TABS: 25.0000 mg | ORAL_TABLET | Freq: Every day | ORAL | 1 refills | Status: DC

## 2023-07-30 MED ORDER — FUROSEMIDE 40 MG PO TABS
40.0000 mg | ORAL_TABLET | Freq: Two times a day (BID) | ORAL | 2 refills | Status: DC
Start: 2023-07-30 — End: 2023-07-30
  Filled 2023-07-30: qty 30, 15d supply, fill #0

## 2023-08-01 ENCOUNTER — Telehealth: Payer: Self-pay

## 2023-08-02 ENCOUNTER — Other Ambulatory Visit: Payer: Self-pay | Admitting: Family Medicine

## 2023-08-02 DIAGNOSIS — M19171 Post-traumatic osteoarthritis, right ankle and foot: Secondary | ICD-10-CM

## 2023-08-02 NOTE — Telephone Encounter (Signed)
 Copied from CRM [id number]. Topic: Clinical - Medication Refill >> Aug 02, 2023  3:49 PM Macario HERO wrote: Medication: gabapentin  (NEURONTIN ) 300 MG capsule [[id number]] acetaminophen  (GNP PAIN RELIEF EX-STRENGTH) 500 MG tablet [[id number]]  Has the patient contacted their pharmacy? No (Agent: If no, request that the patient contact the pharmacy for the refill. If patient does not wish to contact the pharmacy document the reason why and proceed with request.) (Agent: If yes, when and what did the pharmacy advise?)  This is the patient's preferred pharmacy:  [facility] - [place], [place] - [address] [facility] [address] [place] [address] Phone: [phone] Fax: [phone]  [facility] - [place], [place] - [address] [place] [address] Phone: [phone] Fax: [phone]  [name] [address] [place] [address] Phone: [phone] Fax: [phone]  Is this the correct pharmacy for this prescription? Yes If no, delete pharmacy and type the correct one.   Has the prescription been filled recently? Yes  Is the patient out of the medication? Yes  Has the patient been seen for an appointment in the last year OR does the patient have an upcoming appointment? Yes  Can we respond through MyChart? No  Agent: Please be advised that Rx refills may take up to 3 business days. We ask that you follow-up with your pharmacy.

## 2023-08-03 ENCOUNTER — Telehealth: Payer: Self-pay | Admitting: Cardiology

## 2023-08-03 NOTE — Telephone Encounter (Signed)
 Pt reports that he was changed to 12.5 mg Carvedilol  twice a day and he was on 25 mg twice a day before he went to the hospital.  Looked at hospital d/c papers and it says that Carvedilol  was decreased to 12.5 mg twice daily. He wanted to make sure that is ok with Dr Jeffrie.   Also, he currently has 25 mg pills and has to bite them in half. If the 12.5 mg bid is the correct dose, then he would like a new RX sent in so the tablets will be 12.5 mg each and he won't have to bite the pills in half anymore. Informed him that I will have to get an order from a provider here to be able to send in this prescription. Told him it would not be until tomorrow that someone will follow up.   He verbalized understanding.

## 2023-08-03 NOTE — Telephone Encounter (Signed)
 Pt c/o medication issue:  1. Name of Medication:   carvedilol  (COREG ) 25 MG tablet   2. How are you currently taking this medication (dosage and times per day)?   3. Are you having a reaction (difficulty breathing--STAT)?   4. What is your medication issue?   Patient stated he wants a call back to discuss how he should be taking this medication.

## 2023-08-03 NOTE — Transitions of Care (Post Inpatient/ED Visit) (Signed)
 08/03/2023  Name: Norman Rodriguez MRN: [id number] DOB: 08/23/1955  Today's TOC FU Call Status: Today's TOC FU Call Status:: Successful TOC FU Call Completed TOC FU Call Complete Date: 08/01/23 Patient's Name and Date of Birth confirmed.  Transition Care Management Follow-up Telephone Call Date of Discharge: 07/30/23 Discharge Facility: [name]) Type of Discharge: Inpatient Admission Primary Inpatient Discharge Diagnosis:: Acute on chronic systolic (congestive) heart failure How have you been since you were released from the hospital?: Better (Reports feeling very tired. not eating.) Any questions or concerns?: No  Items Reviewed: Did you receive and understand the discharge instructions provided?: Yes Medications obtained,verified, and reconciled?: Yes (Medications Reviewed) Any new allergies since your discharge?: No Dietary orders reviewed?: Yes Type of Diet Ordered:: low salt heart healthy diet Do you have support at home?: Yes People in Home [RPT]: friend(s) Name of Support/Comfort Primary Source: roommate [name]  Medications Reviewed Today: Medications Reviewed Today     Reviewed by [name], RN (Registered Nurse) on 08/01/23 at 1511  Med List Status: <None>   Medication Order Taking? Sig Documenting Provider Last Dose Status Informant  acetaminophen  (GNP PAIN RELIEF EX-STRENGTH) 500 MG tablet [id number] Yes TAKE 1-2 TABLETS BY MOUTH THREE (THREE) TIMES DAILY AS NEEDED. [name], MD  Active Self, Pharmacy Records  allopurinol  (ZYLOPRIM ) 100 MG tablet [id number] Yes TAKE TWO TABLETS (200 MG TOTAL) BY MOUTH DAILY Rice, Lonni ORN, MD  Active Self, Pharmacy Records  amiodarone  (PACERONE ) 200 MG tablet [id number] Yes Take 1 tablet (200 mg total) by mouth daily. 400 MG TWICE A DAY 2 WEEKS 200 MG TWICE A DAY 2 WEEKS THEN DAILY Fairy Frames, MD  Active   atorvastatin  (LIPITOR ) 40 MG tablet [id number] Yes TAKE ONE TABLET BY MOUTH DAILY Jeffrie Oneil BROCKS, MD   Active Self, Pharmacy Records  carvedilol  (COREG ) 25 MG tablet [id number] Yes Take 0.5 tablets (12.5 mg total) by mouth 2 (two) times daily. Fairy Frames, MD  Active   Coenzyme Q10 (CO Q-10) 200 MG CAPS [id number] Yes Take 200 mg by mouth daily. [provider]  Active Self, Pharmacy Records  Colchicine  0.6 MG CAPS [id number]  Day 1: Take 1 caps. If still having swelling, may take 1 cap the following day.  Patient not taking: Reported on 08/01/2023   [name], MD  Active Self, Pharmacy Records           Med Note [name], [name]   Tue Jul 26, 2023  3:03 AM) Emergency use only  digoxin  (LANOXIN ) 0.125 MG tablet [id number] Yes TAKE ONE TABLET BY MOUTH DAILY Jeffrie Oneil BROCKS, MD  Active Self, Pharmacy Records  empagliflozin  (JARDIANCE ) 10 MG TABS tablet [id number] Yes Take 1 tablet (10 mg total) by mouth daily. Fairy Frames, MD  Active   fexofenadine (ALLEGRA) 180 MG tablet [id number] Yes Take 180 mg by mouth daily. [provider]  Active Self, Pharmacy Records  furosemide  (LASIX ) 40 MG tablet [id number] Yes Take 1 tablet (40 mg total) by mouth 2 (two) times daily. Fairy Frames, MD  Active   gabapentin  (NEURONTIN ) 300 MG capsule [id number] Yes Take 300 mg by mouth at bedtime as needed (sleep nerve pain). [provider]  Active Self, Pharmacy Records  Multiple Vitamin (MULTIVITAMIN) tablet [id number] Yes Take 1 tablet by mouth daily. [provider]  Active Self, Pharmacy Records  nitroGLYCERIN  (NITROSTAT ) 0.4 MG SL tablet [id number] Yes Place 1 tablet (0.4 mg total) under the tongue every 5 (five) minutes as needed for  chest pain. Jeffrie Oneil BROCKS, MD  Active Self, Pharmacy Records  Omega-3 Fatty Acids (OMEGA 3 PO) [id number] Yes Take 1 capsule by mouth daily. [provider]  Active Self, Pharmacy Records  rivaroxaban  (XARELTO ) 20 MG TABS tablet [id number] Yes TAKE ONE TABLET BY MOUTH DAILY WITH SUPPER Jeffrie Oneil BROCKS, MD  Active Self, Pharmacy Records   sacubitril -valsartan  (ENTRESTO ) 24-26 MG [id number] Yes Take 1 tablet by mouth 2 (two) times daily. Jeffrie Oneil BROCKS, MD  Active Self, Pharmacy Records  spironolactone  (ALDACTONE ) 25 MG tablet [id number] Yes Take 1 tablet (25 mg total) by mouth daily. Fairy Frames, MD  Active   vitamin C  (ASCORBIC ACID ) 500 MG tablet [id number] Yes Take 500 mg by mouth daily. [provider]  Active Self, Pharmacy Records  VITAMIN D, CHOLECALCIFEROL , PO [id number] Yes Take 5,000 Units by mouth daily. [provider]  Active Self, Pharmacy Records            Home Care and Equipment/Supplies: Were Home Health Services Ordered?: No Any new equipment or medical supplies ordered?: No  Functional Questionnaire: Do you need assistance with bathing/showering or dressing?: No Do you need assistance with meal preparation?: No Do you need assistance with eating?: No Do you have difficulty maintaining continence: No Do you need assistance with getting out of bed/getting out of a chair/moving?: No Do you have difficulty managing or taking your medications?: No  Follow up appointments reviewed: PCP Follow-up appointment confirmed?: No (patient will call himself) MD Provider Line Number:[phone] Given: No Specialist Hospital Follow-up appointment confirmed?: Yes Date of Specialist follow-up appointment?: 08/04/23 Follow-Up Specialty Provider:: Cardiology Do you need transportation to your follow-up appointment?: No (Uses [employer] transportation) Do you understand care options if your condition(s) worsen?: Yes-patient verbalized understanding  SDOH Interventions Today    Flowsheet Row Most Recent Value  SDOH Interventions   Food Insecurity Interventions Intervention Not Indicated  Housing Interventions Intervention Not Indicated  Transportation Interventions Other (Comment)  [uses his [employer] benefit]  Utilities Interventions Intervention Not Indicated   Patient reports that he is doing well.  Reports not weighing due to no scale- but states that he will get a scale. Reviewed the importance of daily weight and keeping a log.  Reviewed importance of low diet.  Reviewed medications with patient and there was a question about need for amiodarone  loading vs daily dose.  I messaged Dr. Casimiro and per message from Dr. Casimiro no loading dose, patient needs to take 200 mg daily  Patient verbalized understanding. Medication list updated and note put in the medication section.  Reviewed importance of follow up with PCP and patient wants to call himself and schedule.   Patient denies any other concerns. Reviewed pending cardiology follow up for 08/04/2023 and patient confirms that he has transportation.  Reviewed and offered 30 day TOC program and patient declined. Provided my contact information for patient to call me back if needed.    Alan Ee, RN, BSN, CEN [employer]- Transition of Care Team.  [facility] [phone]

## 2023-08-04 ENCOUNTER — Ambulatory Visit (HOSPITAL_COMMUNITY): Payer: Self-pay | Admitting: Cardiology

## 2023-08-04 ENCOUNTER — Ambulatory Visit (HOSPITAL_COMMUNITY)
Admit: 2023-08-04 | Discharge: 2023-08-04 | Disposition: A | Discharge status: Home / Self Care | Attending: Cardiology | Admitting: Cardiology

## 2023-08-04 ENCOUNTER — Encounter (HOSPITAL_COMMUNITY): Payer: Self-pay

## 2023-08-04 VITALS — BP 122/62 | HR 58 | Ht 67.0 in | Wt 232.2 lb

## 2023-08-04 DIAGNOSIS — I4821 Permanent atrial fibrillation: Secondary | ICD-10-CM | POA: Insufficient documentation

## 2023-08-04 DIAGNOSIS — Z7984 Long term (current) use of oral hypoglycemic drugs: Secondary | ICD-10-CM | POA: Diagnosis not present

## 2023-08-04 DIAGNOSIS — J449 Chronic obstructive pulmonary disease, unspecified: Secondary | ICD-10-CM | POA: Diagnosis not present

## 2023-08-04 DIAGNOSIS — I447 Left bundle-branch block, unspecified: Secondary | ICD-10-CM | POA: Insufficient documentation

## 2023-08-04 DIAGNOSIS — Z87891 Personal history of nicotine dependence: Secondary | ICD-10-CM | POA: Diagnosis not present

## 2023-08-04 DIAGNOSIS — I110 Hypertensive heart disease with heart failure: Secondary | ICD-10-CM | POA: Insufficient documentation

## 2023-08-04 DIAGNOSIS — Z955 Presence of coronary angioplasty implant and graft: Secondary | ICD-10-CM | POA: Insufficient documentation

## 2023-08-04 DIAGNOSIS — Z7901 Long term (current) use of anticoagulants: Secondary | ICD-10-CM | POA: Diagnosis not present

## 2023-08-04 DIAGNOSIS — Z79899 Other long term (current) drug therapy: Secondary | ICD-10-CM | POA: Diagnosis not present

## 2023-08-04 DIAGNOSIS — I5022 Chronic systolic (congestive) heart failure: Secondary | ICD-10-CM | POA: Diagnosis not present

## 2023-08-04 DIAGNOSIS — G8929 Other chronic pain: Secondary | ICD-10-CM | POA: Insufficient documentation

## 2023-08-04 DIAGNOSIS — Z95810 Presence of automatic (implantable) cardiac defibrillator: Secondary | ICD-10-CM | POA: Insufficient documentation

## 2023-08-04 DIAGNOSIS — E785 Hyperlipidemia, unspecified: Secondary | ICD-10-CM | POA: Diagnosis not present

## 2023-08-04 DIAGNOSIS — I2510 Atherosclerotic heart disease of native coronary artery without angina pectoris: Secondary | ICD-10-CM | POA: Diagnosis not present

## 2023-08-04 DIAGNOSIS — I480 Paroxysmal atrial fibrillation: Secondary | ICD-10-CM | POA: Insufficient documentation

## 2023-08-04 DIAGNOSIS — E669 Obesity, unspecified: Secondary | ICD-10-CM | POA: Diagnosis not present

## 2023-08-04 DIAGNOSIS — M549 Dorsalgia, unspecified: Secondary | ICD-10-CM | POA: Insufficient documentation

## 2023-08-04 DIAGNOSIS — I255 Ischemic cardiomyopathy: Secondary | ICD-10-CM | POA: Diagnosis not present

## 2023-08-04 LAB — BASIC METABOLIC PANEL WITH GFR
Anion gap: 11 (ref 5–15)
BUN: 36 mg/dL — ABNORMAL HIGH (ref 8–23)
CO2: 25 mmol/L (ref 22–32)
Calcium: 10 mg/dL (ref 8.9–10.3)
Chloride: 99 mmol/L (ref 98–111)
Creatinine, Ser: 1.53 mg/dL — ABNORMAL HIGH (ref 0.61–1.24)
GFR, Estimated: 49 mL/min — ABNORMAL LOW (ref >60)
Glucose, Bld: 140 mg/dL — ABNORMAL HIGH (ref 70–99)
Potassium: 5.4 mmol/L — ABNORMAL HIGH (ref 3.5–5.1)
Sodium: 135 mmol/L (ref 135–145)

## 2023-08-04 LAB — DIGOXIN LEVEL: Digoxin Level: 1.9 ng/mL (ref 0.8–2.0)

## 2023-08-04 MED ORDER — CARVEDILOL 12.5 MG PO TABS: 12.5000 mg | ORAL_TABLET | Freq: Two times a day (BID) | ORAL | 3 refills | Status: DC

## 2023-08-04 NOTE — Progress Notes (Signed)
 HEART & VASCULAR TRANSITION OF CARE CONSULT NOTE     Referring Physician: Dr. Noralee   Chief Complaint: Systolic Heart Failure   HPI: Referred to clinic by Dr. Arrien for heart failure consultation.   Norman Rodriguez is a 68 y.o. male w/ h/o, HTN, HLD, permanent atrial fibrillation, COPD and long standing chronic systolic heart failure, dating back to 2010. Ischemic CM. Echo in 2010 EF 20-25%, LHC w/ obstructive prox LAD treated w/ PCI + DES.   He was admitted 12/2020 with sustained ventricular tachycardia. He underwent left heart catheterization with no target lesion. The previously placed LAD stent was widely patent. He was noted to have CTO of the RCA w/ left to right collaterals. The LCx was small and diffusely diseased. Medical management recommended. Echo showed EF 30-35%, RV mildly reduced. No LVH. He did not get cMRI. Abbott ICD implanted 01/06/2021.   Recently admitted 06/2023 for a/c systolic heart failure w/ volume overload. Echo showed EF 30-35%, RV mildly reduced, severe BAE, mod MR, IVC dilated, assumed RAP ~15 mmhg, Abnormal (paradoxical) septal motion also noted c/w LBBB. EKG however showed rate controlled afib w/ narrow QRS 104 ms. He was diuresed w/ IV Lasix  and placed on GDMT. Referred to [facility] clinic. D/c wt 233 lb.   Today in f/u, reports doing fairly well. Breathing improved. No resting dyspnea. Wt stable at 232 lb today. No  resting dyspnea. No CP. Reports full med compliance. BP 122/62. He reports some recent positional dizziness but no syncope/ near syncope.   Baseline functional status is not great. Obese. Struggles w/ chronic back pain. Requires cane for ambulation. Moves slowly. Able to go grocery shopping but says he has to go w/ a friend for assistance.    Cardiac Testing   2D Echo 07/26/2023 1. No evidence of left ventricular thrombus (Definity  was used). Left  ventricular ejection fraction, by estimation, is 30 to 35%. Left  ventricular ejection  fraction by 2D MOD biplane is 33.7 %. The left  ventricle has moderately decreased function. The  left ventricle demonstrates regional wall motion abnormalities (see  scoring diagram/findings for description). The left ventricular internal  cavity size was moderately dilated.   2. Right ventricular systolic function is low normal. The right  ventricular size is normal. There is mildly elevated pulmonary artery  systolic pressure. The estimated right ventricular systolic pressure is  35.8 mmHg.   3. Left atrial size was severely dilated.   4. Right atrial size was severely dilated.   5. The mitral valve is normal in structure. Moderate mitral valve  regurgitation.   6. The aortic valve is tricuspid. There is mild calcification of the  aortic valve. There is mild thickening of the aortic valve. Aortic valve  regurgitation is not visualized. Aortic valve sclerosis is present, with  no evidence of aortic valve stenosis.   7. The inferior vena cava is dilated in size with <50% respiratory  variability, suggesting right atrial pressure of 15 mmHg.     LHC 12/2020   Prox Cx to Mid Cx lesion is 70% stenosed.   Prox RCA to Mid RCA lesion is 90% stenosed.   Mid RCA lesion is 100% stenosed.   Previously placed Prox LAD to Mid LAD stent (unknown type) is  widely patent.   There is severe left ventricular systolic dysfunction.   LV end diastolic pressure is mildly elevated.   The left ventricular ejection fraction is 25-35% by visual estimate.    Past  Medical History:  Diagnosis Date   ACS (acute coronary syndrome) (HCC)    Arthritis    Atrial fibrillation (HCC)    a. Dx 2010. b. Initiated on Tikosyn  03/2013.   BRBPR (bright red blood per rectum) 10/22/2017   CAD (coronary artery disease)    a. BMS 2.22mm 23 to LAD. CTO of RCA unsuccessful, good left to right collaterals - 2010.   Chronic anticoagulation    Chronic systolic CHF (congestive heart failure) (HCC)    a. Both ischemic and tachy  induced. b. Echo 04/2013: EF 30-35%, no RWMA, normal RV, mod dilated LA (before restoration of NSR).   COPD (chronic obstructive pulmonary disease) (HCC) 10/22/2017   Dysrhythmia    hx of atrial fibrilation   HTN (hypertension)    Hyperlipidemia    Melena    NSTEMI (non-ST elevated myocardial infarction) (HCC) 01/05/2021   NSVT (nonsustained ventricular tachycardia) (HCC)    Obesity    Polyp of cecum    Polyp of descending colon    Polyp of rectum    Polyp of transverse colon    Shortness of breath     Current Outpatient Medications  Medication Sig Dispense Refill   acetaminophen  (GNP PAIN RELIEF EX-STRENGTH) 500 MG tablet TAKE 1-2 TABLETS BY MOUTH THREE (THREE) TIMES DAILY AS NEEDED. 180 tablet 0   allopurinol  (ZYLOPRIM ) 100 MG tablet TAKE TWO TABLETS (200 MG TOTAL) BY MOUTH DAILY 180 tablet 0   amiodarone  (PACERONE ) 200 MG tablet Take 1 tablet (200 mg total) by mouth daily. 400 MG TWICE A DAY 2 WEEKS 200 MG TWICE A DAY 2 WEEKS THEN DAILY     atorvastatin  (LIPITOR ) 40 MG tablet TAKE ONE TABLET BY MOUTH DAILY 90 tablet 0   carvedilol  (COREG ) 12.5 MG tablet Take 1 tablet (12.5 mg total) by mouth 2 (two) times daily. 180 tablet 3   Coenzyme Q10 (CO Q-10) 200 MG CAPS Take 200 mg by mouth daily.     Colchicine  0.6 MG CAPS Day 1: Take 1 caps. If still having swelling, may take 1 cap the following day. 30 capsule 0   digoxin  (LANOXIN ) 0.125 MG tablet TAKE ONE TABLET BY MOUTH DAILY 90 tablet 0   empagliflozin  (JARDIANCE ) 10 MG TABS tablet Take 1 tablet (10 mg total) by mouth daily. 30 tablet 1   fexofenadine (ALLEGRA) 180 MG tablet Take 180 mg by mouth daily.     furosemide  (LASIX ) 40 MG tablet Take 1 tablet (40 mg total) by mouth 2 (two) times daily. 30 tablet 2   gabapentin  (NEURONTIN ) 300 MG capsule Take 300 mg by mouth at bedtime as needed (sleep nerve pain).     Multiple Vitamin (MULTIVITAMIN) tablet Take 1 tablet by mouth daily.     nitroGLYCERIN  (NITROSTAT ) 0.4 MG SL tablet Place 1  tablet (0.4 mg total) under the tongue every 5 (five) minutes as needed for chest pain. 25 tablet 4   Omega-3 Fatty Acids (OMEGA 3 PO) Take 1 capsule by mouth daily.     rivaroxaban  (XARELTO ) 20 MG TABS tablet TAKE ONE TABLET BY MOUTH DAILY WITH SUPPER 90 tablet 1   sacubitril -valsartan  (ENTRESTO ) 24-26 MG Take 1 tablet by mouth 2 (two) times daily. 180 tablet 0   spironolactone  (ALDACTONE ) 25 MG tablet Take 1 tablet (25 mg total) by mouth daily. 30 tablet 1   vitamin C  (ASCORBIC ACID ) 500 MG tablet Take 500 mg by mouth daily.     VITAMIN D, CHOLECALCIFEROL , PO Take 5,000 Units by mouth daily.  No current facility-administered medications for this encounter.    Allergies  Allergen Reactions   Apple Anaphylaxis   Apple Juice Anaphylaxis   Soybean Oil Other (See Comments)    unknown   Flaxseed (Linseed)    Lisinopril Cough   Other Itching    Pt has itching and joint swelling with walnuts, mildew, fungi, basil and peaches   Bean Pod Extract Rash    Green Beans specifically Joint swelling and itching   Carrot [Daucus Carota] Rash    Joint swelling and itching   Grass Extracts [Gramineae Pollens] Rash    Joint swelling and itching   Peach Flavoring Agent (Non-Screening) Rash    Joint swelling and itching   Tree Extract Rash    Joint swelling and itching      Social History   Socioeconomic History   Marital status: Single    Spouse name: Not on file   Number of children: 0   Years of education: Not on file   Highest education level: High school graduate  Occupational History   Occupation: Retired/ disabled  Tobacco Use   Smoking status: Former    Types: Gaffer exposure: Never   Smokeless tobacco: Never   Tobacco comments:    1 cigar per month states he stopped smoking  Vaping Use   Vaping status: Never Used  Substance and Sexual Activity   Alcohol use: Not Currently   Drug use: No    Comment: 1 per month...pt states he stopped smoking   Sexual  activity: Not Currently  Other Topics Concern   Not on file  Social History Narrative   Not on file   Social Drivers of Health   Financial Resource Strain: Low Risk  (07/27/2023)   Overall Financial Resource Strain (CARDIA)    Difficulty of Paying Living Expenses: Not very hard  Food Insecurity: No Food Insecurity (08/01/2023)   Hunger Vital Sign    Worried About Running Out of Food in the Last Year: Never true    Ran Out of Food in the Last Year: Never true  Transportation Needs: No Transportation Needs (08/01/2023)   PRAPARE - Administrator, Civil Service (Medical): No    Lack of Transportation (Non-Medical): No  Recent Concern: Transportation Needs - Unmet Transportation Needs (07/26/2023)   PRAPARE - Transportation    Lack of Transportation (Medical): Yes    Lack of Transportation (Non-Medical): Yes  Physical Activity: Inactive (02/07/2023)   Exercise Vital Sign    Days of Exercise per Week: 0 days    Minutes of Exercise per Session: 0 min  Stress: No Stress Concern Present (02/07/2023)   [employer] of Occupational Health - Occupational Stress Questionnaire    Feeling of Stress : Not at all  Social Connections: Socially Isolated (07/26/2023)   Social Connection and Isolation Panel    Frequency of Communication with Friends and Family: More than three times a week    Frequency of Social Gatherings with Friends and Family: Once a week    Attends Religious Services: Never    Database administrator or Organizations: No    Attends Banker Meetings: Never    Marital Status: Never married  Intimate Partner Violence: Not At Risk (07/26/2023)   Humiliation, Afraid, Rape, and Kick questionnaire    Fear of Current or Ex-Partner: No    Emotionally Abused: No    Physically Abused: No    Sexually Abused: No  Family History  Problem Relation Age of Onset   Other Father        Car accident   Coronary artery disease Neg Hx     Vitals:   08/04/23 1455   BP: 122/62  Pulse: (!) 58  SpO2: 95%  Weight: 105.3 kg (232 lb 3.2 oz)  Height: 5' 7 (1.702 m)    PHYSICAL EXAM: General:  chronically ill appearing, obese, in WC. No respiratory difficulty HEENT: normal Neck: supple. no JVD. Carotids 2+ bilat; no bruits. No lymphadenopathy or thryomegaly appreciated. Cor: PMI nondisplaced. Regular rate & rhythm. No rubs, gallops or murmurs. Lungs: clear Abdomen: obese, soft, nontender, nondistended. No hepatosplenomegaly. No bruits or masses. Good bowel sounds. Extremities: no cyanosis, clubbing, rash, edema Neuro: alert & oriented x 3, cranial nerves grossly intact. moves all 4 extremities w/o difficulty. Affect pleasant.  ECG:Not performed    ASSESSMENT & PLAN:  1. Chronic Systolic Heart Failure - long-standing, dating back to 2010. ICM - EF chronically low, 20-35%, range. H/o VT now s/p ICD - Recent echo 6/25 EF 30-35%, RV mildly reduced, severe BAE, mod MR - Last cath 1/24 patent LAD stent, CTO of RCA w/ L>R collaterals - NYHA Class III confounded by obesity, deconditioning and chronic back pain  - Euvolemic on exam. Wt stable from hospital d/c dry wt  - we discussed referral to the [facility] for shared care w/ cardiology for ongoing surveillance of HF but he declined  - continue current GDMT. Will not up titrate given recent positional dizziness - continue Entresto  24-26 mg bid - continue spironolactone  25 mg daily  - continue Jardiance  10mg  daily  - continue Lasix  40 mg bid  - continue digoxin  0.125 mg daily. Check dig level  - continue coreg  12.5 mg bid - check BMP today  - has ICD, followed by Dr. Inocencio, no VT on device interrogation   2. CAD - s/p previous LAD stent (patent on last LHC), known CTO of RCA w/ L>R colaterals - stable w/o CP - management per cardiology   3. PAF - on amio + Xarelto   - per cardiology     Referred to HFSW (PCP, Medications, Transportation, ETOH Abuse, Drug Abuse, Insurance, Financial ): No   Refer to Pharmacy: No Refer to Home Health: No Refer to Advanced [facility] Failure Clinic: No  Refer to General Cardiology: Yes   Follow up  w/ Dr. Jeffrie in 4-6 wks   Caffie Shed, PA-C 08/04/2023

## 2023-08-04 NOTE — Telephone Encounter (Signed)
 Spoke to patient Dr.Skains advised take Coreg  12.5 mg twice a day.

## 2023-08-04 NOTE — Patient Instructions (Addendum)
 Thank you for your visit today.  Labs done today, your results will be available in MyChart, we will contact you for abnormal readings.  Thank you for allowing us  to provider your heart failure care after your recent hospitalization. Please follow-up with Dr. Jeffrie.   At the [facility], you and your health needs are our priority. As part of our continuing mission to provide you with exceptional heart care, we have created designated Provider Care Teams. These Care Teams include your primary Cardiologist (physician) and Advanced Practice Providers (APPs- Physician Assistants and Nurse Practitioners) who all work together to provide you with the care you need, when you need it.   You may see any of the following providers on your designated Care Team at your next follow up: Dr Toribio Fuel Dr Ezra Shuck Dr. Ria Commander Dr. Morene Brownie Amy Lenetta, NP Caffie Shed, [place] [facility] [place], [place] Beckey Coe, NP [place] Lee, NP Ellouise Class, NP Tinnie Redman, PharmD Jaun Bash, PharmD   Please be sure to bring in all your medications bottles to every appointment.    Thank you for choosing [facility]

## 2023-08-05 MED ORDER — ACETAMINOPHEN 500 MG PO TABS: ORAL_TABLET | ORAL | 0 refills | Status: AC

## 2023-08-05 MED ORDER — SPIRONOLACTONE 25 MG PO TABS: 12.5000 mg | ORAL_TABLET | Freq: Every day | ORAL | 2 refills | Status: DC

## 2023-08-05 NOTE — Telephone Encounter (Signed)
Pt aware.

## 2023-08-05 NOTE — Telephone Encounter (Signed)
 Tried calling Pt to see who prescribe gabapentin  no answer.

## 2023-08-11 ENCOUNTER — Ambulatory Visit (HOSPITAL_COMMUNITY)
Admission: RE | Admit: 2023-08-11 | Discharge: 2023-08-11 | Disposition: A | Discharge status: Home / Self Care | Source: Ambulatory Visit | Attending: Cardiology | Admitting: Cardiology

## 2023-08-11 ENCOUNTER — Ambulatory Visit (HOSPITAL_COMMUNITY): Payer: Self-pay | Admitting: Cardiology

## 2023-08-11 DIAGNOSIS — I5022 Chronic systolic (congestive) heart failure: Secondary | ICD-10-CM | POA: Insufficient documentation

## 2023-08-11 LAB — BASIC METABOLIC PANEL WITH GFR
Anion gap: 14 (ref 5–15)
BUN: 30 mg/dL — ABNORMAL HIGH (ref 8–23)
CO2: 24 mmol/L (ref 22–32)
Calcium: 9.8 mg/dL (ref 8.9–10.3)
Chloride: 96 mmol/L — ABNORMAL LOW (ref 98–111)
Creatinine, Ser: 1.99 mg/dL — ABNORMAL HIGH (ref 0.61–1.24)
GFR, Estimated: 35 mL/min — ABNORMAL LOW (ref >60)
Glucose, Bld: 118 mg/dL — ABNORMAL HIGH (ref 70–99)
Potassium: 4.8 mmol/L (ref 3.5–5.1)
Sodium: 134 mmol/L — ABNORMAL LOW (ref 135–145)

## 2023-08-11 LAB — DIGOXIN LEVEL: Digoxin Level: 1.4 ng/mL (ref 0.8–2.0)

## 2023-08-17 NOTE — Progress Notes (Signed)
 Remote ICD transmission.

## 2023-08-17 NOTE — Addendum Note (Signed)
 Addended by: TAWNI DRILLING D on: 08/17/2023 11:06 AM   Modules accepted: Orders

## 2023-08-19 ENCOUNTER — Ambulatory Visit: Admitting: Family Medicine

## 2023-08-19 VITALS — BP 90/60 | HR 61 | Temp 97.6°F | Ht 67.0 in | Wt 232.0 lb

## 2023-08-19 DIAGNOSIS — N179 Acute kidney failure, unspecified: Secondary | ICD-10-CM

## 2023-08-19 DIAGNOSIS — E861 Hypovolemia: Secondary | ICD-10-CM | POA: Diagnosis not present

## 2023-08-19 DIAGNOSIS — I2510 Atherosclerotic heart disease of native coronary artery without angina pectoris: Secondary | ICD-10-CM

## 2023-08-19 DIAGNOSIS — L03031 Cellulitis of right toe: Secondary | ICD-10-CM

## 2023-08-19 DIAGNOSIS — L02611 Cutaneous abscess of right foot: Secondary | ICD-10-CM | POA: Diagnosis not present

## 2023-08-19 MED ORDER — CEPHALEXIN 500 MG PO CAPS: 500.0000 mg | ORAL_CAPSULE | Freq: Two times a day (BID) | ORAL | 0 refills | Status: AC

## 2023-08-19 NOTE — Patient Instructions (Addendum)
  VISIT SUMMARY: You came in today because you have been experiencing dizziness and low blood pressure. We discussed your current medications and recent changes, as well as your concerns about your toes and overall heart health.  YOUR PLAN: -ATRIAL FIBRILLATION (AFIB): Atrial fibrillation is an irregular and often rapid heart rate that can lead to poor blood flow. We have decided to temporarily stop your furosemide  and Jardiance  to help manage your low blood pressure. Please monitor your blood pressure closely and we will recheck it in one week.  -HYPOTENSION: Hypotension means low blood pressure. It is likely caused by your fluid-reducing medications. We have advised you to hold off on taking furosemide  and Jardiance  for now and to drink fluids to help increase your blood pressure. Please monitor your blood pressure closely and we will recheck it in one week.  -CELLULITIS: Cellulitis is a skin infection of your toe. It can cause severe pain, redness, and tenderness in joints. Your toe pain and redness may also be due to gout. We will start you on an antibiotic cephalexin 500 mg BID. Please monitor for any signs of infection or worsening symptoms.  -GENERAL HEALTH MAINTENANCE: We discussed the importance of managing your medications to protect your heart and manage your blood pressure. It is crucial to adhere to your medication regimen and understand the role of each medication in supporting your heart health.  INSTRUCTIONS: Please schedule a follow-up appointment in one week to recheck your blood pressure. Monitor your blood pressure closely at home and watch for any signs of infection or worsening symptoms in your toe.

## 2023-08-19 NOTE — Progress Notes (Signed)
 Assessment & Plan   Assessment/Plan:     Assessment & Plan Atrial Fibrillation (AFib) Chronic atrial fibrillation with recent episodes of bradycardia and hypotension. He is on digoxin , spironolactone , carvedilol , and Entresto . Recent medication adjustments, including stopping furosemide , may have contributed to hypotension. He is on anticoagulation therapy and has an ICD-10 in place. Heart medications are intended to protect heart function and manage blood pressure, but require careful management to avoid adverse effects such as hypotension. - Hold furosemide  and Jardiance  temporarily to address hypotension. - Monitor blood pressure closely. - Recheck blood pressure in one week.  Hypotension Recent hypotension likely secondary to over-diuresis from furosemide  and other fluid-reducing medications. He has been advised to hold furosemide  and Jardiance  to stabilize blood pressure. The goal is to balance medications to maintain adequate blood pressure without causing fluid overload. - Provide fluids to help increase blood pressure. - Monitor blood pressure closely. - Recheck blood pressure in one week.  Toe pain  Possibly Cellulitis vs. Gout with current symptoms of redness and pain in the toe, possibly exacerbated by recent furosemide  use. He is on allopurinol  for management.  - Cephalexin  500 mg BID for 7 days - Monitor for signs of infection or worsening gout symptoms.  General Health Maintenance Discussion of medication management and the importance of maintaining a balance in heart medications to protect heart function and manage blood pressure. Medications have heart protection properties beyond primary indications and work together to support heart health. - Educate on the importance of medication adherence and understanding the role of each medication in heart health.  Follow-up Follow-up is necessary to monitor blood pressure and adjust medications as needed. - Schedule follow-up  appointment in one week to recheck blood pressure.      There are no discontinued medications.  Return in about 1 week (around 08/26/2023) for BP.        Subjective:   Encounter date: 08/19/2023  Norman Rodriguez is a 68 y.o. male who has Cardiomyopathy [facility]); CAD (coronary artery disease); Chronic anticoagulation; NSVT (nonsustained ventricular tachycardia) (HCC); Hyperlipidemia; HTN (hypertension); Chronic systolic heart failure (HCC); PAF (paroxysmal atrial fibrillation) (HCC); COPD (chronic obstructive pulmonary disease) (HCC); Ventricular tachycardia (HCC); Post-traumatic osteoarthritis of multiple joints; Toe pain, left; Carpal tunnel syndrome of left wrist; BMI 39.0-39.9,adult; Finger swelling; Chronic gout of hand; Chronic deep vein thrombosis (DVT) of femoral vein of right lower extremity (HCC); Orthostatic dizziness; Acute on chronic systolic (congestive) heart failure (HCC); Obesity, class 3; and Hypokalemia on their problem list..   He  has a past medical history of ACS (acute coronary syndrome) (HCC), Arthritis, Atrial fibrillation (HCC), BRBPR (bright red blood per rectum) (10/22/2017), CAD (coronary artery disease), Chronic anticoagulation, Chronic systolic CHF (congestive heart failure) (HCC), COPD (chronic obstructive pulmonary disease) (HCC) (10/22/2017), Dysrhythmia, HTN (hypertension), Hyperlipidemia, Melena, NSTEMI (non-ST elevated myocardial infarction) (HCC) (01/05/2021), NSVT (nonsustained ventricular tachycardia) (HCC), Obesity, Polyp of cecum, Polyp of descending colon, Polyp of rectum, Polyp of transverse colon, and Shortness of breath.SABRA   He presents with chief complaint of Hospitalization Follow-up .   Discussed the use of AI scribe software for clinical note transcription with the patient, who gave verbal consent to proceed.  History of Present Illness Norman Rodriguez is a 68 year old male with atrial fibrillation and heart failure who presents with dizziness  and low blood pressure.  He has been experiencing persistent dizziness and notes that his blood pressure is typically low. His heart rate has been fluctuating between 42 to 56 bpm.  He has been on digoxin  for approximately two years for his heart condition, though he is unsure of its intended effect on his heart rate.  He recalls a recent hospitalization where 15.5 liters of fluid were removed, resulting in significant weight loss from 261-262 pounds to 232 pounds. Since the fluid removal, he has not experienced chest pain or shortness of breath.  He is concerned about his toes, which have appeared purplish and painful, especially at night. He is worried about a condition he read about called 'blue toe' and potential flesh-eating complications. He has a history of gout and takes allopurinol  for it. He also mentions that Lasix  might have exacerbated his gout symptoms.  His medication regimen includes furosemide  (Lasix ) previously taken twice daily, now taken as needed, spironolactone , carvedilol , Entresto , and Jardiance . He associates dizziness with his medication regimen and expresses confusion about the changes in his medication and their purposes. His blood pressure is typically low.     ROS  Past Surgical History:  Procedure Laterality Date   CARDIAC DEFIBRILLATOR PLACEMENT  2024   CARDIOVERSION N/A 04/27/2013   Procedure: BEDSIDE CARDIOVERSION;  Surgeon: Jerel Balding, MD;  Location: [facility] OR;  Service: Cardiovascular;  Laterality: N/A;   CARDIOVERSION N/A 05/15/2013   Procedure: CARDIOVERSION/BEDSIDE;  Surgeon: Ezra GORMAN Shuck, MD;  Location[facility] OR;  Service: Cardiovascular;  Laterality: N/A;   CARDIOVERSION N/A 05/16/2014   Procedure: CARDIOVERSION;  Surgeon: Oneil JAYSON Parchment, MD;  Location: [facility] ENDOSCOPY;  Service: Cardiovascular;  Laterality: N/A;   CARDIOVERSION N/A 06/07/2014   Procedure: CARDIOVERSION;  Surgeon: Oneil JAYSON Parchment, MD;  Location: [facility] ENDOSCOPY;  Service: Cardiovascular;   Laterality: N/A;   COLONOSCOPY WITH PROPOFOL  N/A 10/27/2017   Procedure: COLONOSCOPY WITH PROPOFOL ;  Surgeon: Shila Gustav GAILS, MD;  Location: [facility] ENDOSCOPY;  Service: Endoscopy;  Laterality: N/A;   CORONARY ANGIOPLASTY WITH STENT PLACEMENT  01/26/2008   BMS 2.34mm 23 to LAD. CTO of RCA unsuccessful, good left to right collaterals   ESOPHAGOGASTRODUODENOSCOPY (EGD) WITH PROPOFOL  N/A 10/24/2017   Procedure: ESOPHAGOGASTRODUODENOSCOPY (EGD) WITH PROPOFOL ;  Surgeon: Shila Gustav GAILS, MD;  Location: [facility] ENDOSCOPY;  Service: Endoscopy;  Laterality: N/A;   FOOT SURGERY Right    ICD IMPLANT N/A 01/06/2021   Procedure: ICD IMPLANT;  Surgeon: Inocencio Soyla Lunger, MD;  Location: [facility] INVASIVE CV LAB;  Service: Cardiovascular;  Laterality: N/A;   LEFT HEART CATH AND CORONARY ANGIOGRAPHY N/A 01/05/2021   Procedure: LEFT HEART CATH AND CORONARY ANGIOGRAPHY;  Surgeon: Court Dorn PARAS, MD;  Location: [facility] INVASIVE CV LAB;  Service: Cardiovascular;  Laterality: N/A;   POLYPECTOMY  10/27/2017   Procedure: POLYPECTOMY;  Surgeon: Shila Gustav GAILS, MD;  Location: [facility] ENDOSCOPY;  Service: Endoscopy;;    Outpatient Medications Prior to Visit  Medication Sig Dispense Refill   acetaminophen  (GNP PAIN RELIEF EX-STRENGTH) 500 MG tablet TAKE 1-2 TABLETS BY MOUTH THREE (THREE) TIMES DAILY AS NEEDED. 180 tablet 0   allopurinol  (ZYLOPRIM ) 100 MG tablet TAKE TWO TABLETS (200 MG TOTAL) BY MOUTH DAILY 180 tablet 0   atorvastatin  (LIPITOR ) 40 MG tablet TAKE ONE TABLET BY MOUTH DAILY 90 tablet 0   carvedilol  (COREG ) 12.5 MG tablet Take 1 tablet (12.5 mg total) by mouth 2 (two) times daily. 180 tablet 3   Coenzyme Q10 (CO Q-10) 200 MG CAPS Take 200 mg by mouth daily.     digoxin  (LANOXIN ) 0.125 MG tablet TAKE ONE TABLET BY MOUTH DAILY 90 tablet 0   empagliflozin  (JARDIANCE ) 10 MG TABS tablet Take 1 tablet (  10 mg total) by mouth daily. 30 tablet 1   fexofenadine (ALLEGRA) 180 MG tablet Take 180 mg by mouth daily.      furosemide  (LASIX ) 40 MG tablet Take 1 tablet (40 mg total) by mouth 2 (two) times daily. 30 tablet 2   gabapentin  (NEURONTIN ) 300 MG capsule Take 300 mg by mouth at bedtime as needed (sleep nerve pain).     Multiple Vitamin (MULTIVITAMIN) tablet Take 1 tablet by mouth daily.     nitroGLYCERIN  (NITROSTAT ) 0.4 MG SL tablet Place 1 tablet (0.4 mg total) under the tongue every 5 (five) minutes as needed for chest pain. 25 tablet 4   Omega-3 Fatty Acids (OMEGA 3 PO) Take 1 capsule by mouth daily.     rivaroxaban  (XARELTO ) 20 MG TABS tablet TAKE ONE TABLET BY MOUTH DAILY WITH SUPPER 90 tablet 1   sacubitril -valsartan  (ENTRESTO ) 24-26 MG Take 1 tablet by mouth 2 (two) times daily. 180 tablet 0   spironolactone  (ALDACTONE ) 25 MG tablet Take 0.5 tablets (12.5 mg total) by mouth daily. 15 tablet 2   vitamin C  (ASCORBIC ACID ) 500 MG tablet Take 500 mg by mouth daily.     VITAMIN D, CHOLECALCIFEROL , PO Take 5,000 Units by mouth daily.     amiodarone  (PACERONE ) 200 MG tablet Take 1 tablet (200 mg total) by mouth daily. 400 MG TWICE A DAY 2 WEEKS 200 MG TWICE A DAY 2 WEEKS THEN DAILY     Colchicine  0.6 MG CAPS Day 1: Take 1 caps. If still having swelling, may take 1 cap the following day. (Patient not taking: Reported on 08/19/2023) 30 capsule 0   No facility-administered medications prior to visit.    Family History  Problem Relation Age of Onset   Other Father        Car accident   Coronary artery disease Neg Hx     Social History   Socioeconomic History   Marital status: Single    Spouse name: Not on file   Number of children: 0   Years of education: Not on file   Highest education level: High school graduate  Occupational History   Occupation: Retired/ disabled  Tobacco Use   Smoking status: Former    Types: Gaffer exposure: Never   Smokeless tobacco: Never   Tobacco comments:    1 cigar per month states he stopped smoking  Vaping Use   Vaping status: Never Used  Substance  and Sexual Activity   Alcohol use: Not Currently   Drug use: No    Comment: 1 per month...pt states he stopped smoking   Sexual activity: Not Currently  Other Topics Concern   Not on file  Social History Narrative   Not on file   Social Drivers of Health   Financial Resource Strain: Low Risk  (07/27/2023)   Overall Financial Resource Strain (CARDIA)    Difficulty of Paying Living Expenses: Not very hard  Food Insecurity: No Food Insecurity (08/01/2023)   Hunger Vital Sign    Worried About Running Out of Food in the Last Year: Never true    Ran Out of Food in the Last Year: Never true  Transportation Needs: No Transportation Needs (08/01/2023)   PRAPARE - Administrator, Civil Service (Medical): No    Lack of Transportation (Non-Medical): No  Recent Concern: Transportation Needs - Unmet Transportation Needs (07/26/2023)   PRAPARE - Transportation    Lack of Transportation (Medical): Yes    Lack of  Transportation (Non-Medical): Yes  Physical Activity: Inactive (02/07/2023)   Exercise Vital Sign    Days of Exercise per Week: 0 days    Minutes of Exercise per Session: 0 min  Stress: No Stress Concern Present (02/07/2023)   [employer] of Occupational Health - Occupational Stress Questionnaire    Feeling of Stress : Not at all  Social Connections: Socially Isolated (07/26/2023)   Social Connection and Isolation Panel    Frequency of Communication with Friends and Family: More than three times a week    Frequency of Social Gatherings with Friends and Family: Once a week    Attends Religious Services: Never    Database administrator or Organizations: No    Attends Banker Meetings: Never    Marital Status: Never married  Intimate Partner Violence: Not At Risk (07/26/2023)   Humiliation, Afraid, Rape, and Kick questionnaire    Fear of Current or Ex-Partner: No    Emotionally Abused: No    Physically Abused: No    Sexually Abused: No                                                                                                   Objective:  Physical Exam: BP 90/60   Pulse 61   Temp 97.6 F (36.4 C)   Ht 5' 7 (1.702 m)   Wt 232 lb (105.2 kg)   SpO2 96%   BMI 36.34 kg/m    Physical Exam VITALS: P- 44, BP- 90/60 MEASUREMENTS: Weight- 232. GENERAL: Alert, cooperative, well developed, no acute distress. HEENT: Normocephalic, normal oropharynx, moist mucous membranes. CHEST: Clear to auscultation bilaterally, no wheezes, rhonchi, or crackles. CARDIOVASCULAR: Low blood pressure and fluctuating heart rate. S1 and S2 normal without murmurs. ABDOMEN: Soft, non-tender, non-distended, without organomegaly, normal bowel sounds. EXTREMITIES: Foot with purplish , redness, and slight tenderness. No cyanosis or edema. NEUROLOGICAL: Cranial nerves grossly intact, moves all extremities without gross motor or sensory deficit.   Physical Exam  ECHOCARDIOGRAM COMPLETE Result Date: 07/26/2023    ECHOCARDIOGRAM REPORT   Patient Name:   Norman Rodriguez Date of Exam: 07/26/2023 Medical Rec #:  [id number]         Height:       67.0 in Accession #:    [id number]        Weight:       260.0 lb Date of Birth:  1955/11/28         BSA:          2.261 m Patient Age:    68 years          BP:           112/76 mmHg Patient Gender: M                 HR:           54 bpm. Exam Location:  Inpatient Procedure: 2D Echo, Color Doppler, Cardiac Doppler and Intracardiac            Opacification Agent (Both Spectral and Color Flow Doppler were  utilized during procedure). Indications:    I50.21 Acute systolic (congestive) heart failure  History:        Patient has prior history of Echocardiogram examinations, most                 recent 01/06/2021. CAD, Defibrillator, COPD, Arrythmias:Atrial                 Fibrillation; Risk Factors:Hypertension and Dyslipidemia.  Sonographer:    Damien Senior RDCS Referring Phys: [id number] MOHAMMAD L GARBA IMPRESSIONS  1. No evidence of left  ventricular thrombus (Definity  was used). Left ventricular ejection fraction, by estimation, is 30 to 35%. Left ventricular ejection fraction by 2D MOD biplane is 33.7 %. The left ventricle has moderately decreased function. The left ventricle demonstrates regional wall motion abnormalities (see scoring diagram/findings for description). The left ventricular internal cavity size was moderately dilated.  2. Right ventricular systolic function is low normal. The right ventricular size is normal. There is mildly elevated pulmonary artery systolic pressure. The estimated right ventricular systolic pressure is 35.8 mmHg.  3. Left atrial size was severely dilated.  4. Right atrial size was severely dilated.  5. The mitral valve is normal in structure. Moderate mitral valve regurgitation.  6. The aortic valve is tricuspid. There is mild calcification of the aortic valve. There is mild thickening of the aortic valve. Aortic valve regurgitation is not visualized. Aortic valve sclerosis is present, with no evidence of aortic valve stenosis.  7. The inferior vena cava is dilated in size with <50% respiratory variability, suggesting right atrial pressure of 15 mmHg. Comparison(s): Prior images reviewed side by side. FINDINGS  Left Ventricle: No evidence of left ventricular thrombus (Definity  was used). Left ventricular ejection fraction, by estimation, is 30 to 35%. Left ventricular ejection fraction by 2D MOD biplane is 33.7 %. The left ventricle has moderately decreased function. The left ventricle demonstrates regional wall motion abnormalities. Definity  contrast agent was given IV to delineate the left ventricular endocardial borders. The left ventricular internal cavity size was moderately dilated. There is no left ventricular hypertrophy. Abnormal (paradoxical) septal motion, consistent with left bundle branch block. Left ventricular diastolic function could not be evaluated due to atrial fibrillation.  LV Wall Scoring:  The inferior wall, posterior wall, mid inferoseptal segment, and basal inferoseptal segment are akinetic. The apical inferior segment is hypokinetic. The entire anterior wall, antero-lateral wall, entire anterior septum, apical lateral segment, and apex are normal. Right Ventricle: The right ventricular size is normal. No increase in right ventricular wall thickness. Right ventricular systolic function is low normal. There is mildly elevated pulmonary artery systolic pressure. The tricuspid regurgitant velocity is 2.28 m/s, and with an assumed right atrial pressure of 15 mmHg, the estimated right ventricular systolic pressure is 35.8 mmHg. Left Atrium: Left atrial size was severely dilated. Right Atrium: Right atrial size was severely dilated. Pericardium: Trivial pericardial effusion is present. Mitral Valve: The mitral valve is normal in structure. Moderate mitral valve regurgitation, with eccentric laterally directed jet. Tricuspid Valve: The tricuspid valve is normal in structure. Tricuspid valve regurgitation is trivial. Aortic Valve: The aortic valve is tricuspid. There is mild calcification of the aortic valve. There is mild thickening of the aortic valve. Aortic valve regurgitation is not visualized. Aortic valve sclerosis is present, with no evidence of aortic valve stenosis. Pulmonic Valve: The pulmonic valve was normal in structure. Pulmonic valve regurgitation is not visualized. Aorta: The aortic root and ascending aorta are structurally normal, with no evidence of  dilitation. Venous: The inferior vena cava is dilated in size with less than 50% respiratory variability, suggesting right atrial pressure of 15 mmHg. IAS/Shunts: No atrial level shunt detected by color flow Doppler. Additional Comments: A device lead is visualized in the right ventricle.  LEFT VENTRICLE PLAX 2D                        Biplane EF (MOD) LVIDd:         6.00 cm         LV Biplane EF:   Left LVIDs:         5.80 cm                           ventricular LV PW:         0.60 cm                          ejection LV IVS:        0.70 cm                          fraction by LVOT diam:     2.30 cm                          2D MOD LVOT Area:     4.15 cm                         biplane is                                                 33.7 %.  LV Volumes (MOD) LV vol d, MOD    233.0 ml A2C: LV vol d, MOD    224.0 ml A4C: LV vol s, MOD    152.0 ml A2C: LV vol s, MOD    154.0 ml A4C: LV SV MOD A2C:   81.0 ml LV SV MOD A4C:   224.0 ml LV SV MOD BP:    76.8 ml RIGHT VENTRICLE RV S prime:     13.80 cm/s TAPSE (M-mode): 1.6 cm LEFT ATRIUM              Index        RIGHT ATRIUM           Index LA diam:        4.90 cm  2.17 cm/m   RA Area:     24.10 cm LA Vol (A2C):   98.9 ml  43.75 ml/m  RA Volume:   81.10 ml  35.87 ml/m LA Vol (A4C):   100.0 ml 44.23 ml/m LA Biplane Vol: 106.0 ml 46.89 ml/m   AORTA Ao Root diam: 2.90 cm Ao Asc diam:  3.10 cm TRICUSPID VALVE TR Peak grad:   20.8 mmHg TR Vmax:        228.00 cm/s  SHUNTS Systemic Diam: 2.30 cm Jerel Croitoru MD Electronically signed by Jerel Balding MD Signature Date/Time: 07/26/2023/4:31:19 PM    Final    DG Chest Portable 1 View Result Date: 07/25/2023 CLINICAL DATA:  Shortness of breath. EXAM: PORTABLE CHEST 1 VIEW COMPARISON:  01/07/2021 FINDINGS: Left-sided pacemaker in place.  Mild cardiomegaly is similar. Diffuse vascular congestion. Ill-defined opacity at the left lung base. No pneumothorax. No large pleural effusion, suboptimally assessed due to soft tissue attenuation. IMPRESSION: 1. Mild cardiomegaly with vascular congestion. 2. Ill-defined opacity at the left lung base may represent atelectasis/airspace disease or soft tissue attenuation from habitus. Electronically Signed   By: Andrea Gasman M.D.   On: 07/25/2023 17:41   CUP PACEART REMOTE DEVICE CHECK Result Date: 07/05/2023 ICD Scheduled remote reviewed. Normal device function.  Presenting rhythm:  VS Alert status for successful ATP  delivered.  Event occurred 6/9 @ 16:37, duration 22sec per device, HR 181.  EGM c/w sustained VT pace terminated with 1 burst of ATP - route to triage per protocol Numerous NSVT and SVT, no EGM's for review.  Recent increase in activity and HR's per trends Next remote 91 days. LA, CVRS   Recent Results (from the past 2160 hours)  CUP PACEART REMOTE DEVICE CHECK     Status: None   Collection Time: 07/04/23  2:01 AM  Result Value Ref Range   Date Time Interrogation Session [id number]    Pulse Generator Manufacturer [employer]    Pulse Gen Model [id number] [place] VR    Pulse Gen Serial Number [id number]    Clinic Name [facility]    Implantable Pulse Generator Type Implantable Cardiac Defibulator    Implantable Pulse Generator Implant Date 79778786    Implantable Lead Manufacturer [facility]    Implantable Lead Model [id number] Durata SJ4    Implantable Lead Serial Number [id number]    Implantable Lead Implant Date 79778786    Implantable Lead Location Detail 1 APEX    Implantable Lead Location [id number]    Implantable Lead Connection Status [id number]    Lead Channel Setting Sensing Sensitivity 0.5 mV   Lead Channel Setting Sensing Adaptation Mode Adaptive Sensing    Lead Channel Setting Pacing Pulse Width 0.5 ms   Lead Channel Setting Pacing Amplitude 2.5 V   Zone Setting Status Active    Zone Setting Status Active    Zone Setting Status Active    Lead Channel Status NULL    Lead Channel Impedance Value 360 ohm   Lead Channel Sensing Intrinsic Amplitude 7.3 mV   Lead Channel Pacing Threshold Amplitude 1.0 V   Lead Channel Pacing Threshold Pulse Width 0.5 ms   HighPow Impedance 64 ohm   HighPow Imped Status NULL    Battery Status MOS    Battery Remaining Longevity 97 mo   Battery Remaining Percentage 79.0 %   Battery Voltage 2.99 V   Brady Statistic RV Percent Paced 8.1 %  Comprehensive metabolic panel with GFR     Status: Abnormal   Collection Time: 07/11/23  3:39 PM  Result Value Ref Range    Sodium 140 135 - 145 mEq/L   Potassium 4.6 3.5 - 5.1 mEq/L   Chloride 102 96 - 112 mEq/L   CO2 32 19 - 32 mEq/L   Glucose, Bld 108 (H) 70 - 99 mg/dL   BUN 11 6 - 23 mg/dL   Creatinine, Ser 8.90 0.40 - 1.50 mg/dL   Total Bilirubin 1.6 (H) 0.2 - 1.2 mg/dL   Alkaline Phosphatase 64 39 - 117 U/L   AST 23 0 - 37 U/L   ALT 24 0 - 53 U/L   Total Protein 7.2 6.0 - 8.3 g/dL   Albumin 4.0 3.5 - 5.2 g/dL   GFR 31.11 >39.99 mL/min    Comment: Calculated using the CKD-EPI Creatinine Equation (  2021)   Calcium  9.5 8.4 - 10.5 mg/dL  Magnesium      Status: None   Collection Time: 07/11/23  3:39 PM  Result Value Ref Range   Magnesium  1.8 1.5 - 2.5 mg/dL  B Nat Peptide     Status: Abnormal   Collection Time: 07/11/23  3:39 PM  Result Value Ref Range   Pro B Natriuretic peptide (BNP) 679.0 (H) 0.0 - 100.0 pg/mL  Digoxin  level     Status: None   Collection Time: 07/11/23  3:39 PM  Result Value Ref Range   Digoxin  Level 1.5 0.8 - 2.0 mcg/L    Comment: FAB Anti-Digoxin  (Digibind(R)) in serum/plasma of  patients under toxicity therapy may interfere with the digoxin  immunoassay. . Note: Patients with levels as low as 0.5 ng/mL may  experience similar benefits regarding symptoms of heart  failure, improvement in LVEFs, and increased treadmill  time compared with patients with moderate (1.0 to  1.2 ng/mL) to high (more than 1.2 ng/mL) serum digoxin   concentrations. Harriett, SHAUNNA Jolinda KATHEE Therisa CHARM rosemarie al. 2022 AHA/ACC/HFSA Guideline for the Management  of Heart Failure: A Report of the [employer] of  Cardiology/American Heart Association Joint Committee  on Clinical Practice Guidelines. J Am Coll Cardiol.  2022 May, [phone]; Dannielle FORTH, Hatcher HF,  Jess OMS. Digoxin  therapy for heart failure: an update.  Am Fam Physician. 2006 Aug 15;74(4):613-8)    Thyroid  Panel With TSH     Status: Abnormal   Collection Time: 07/11/23  3:39 PM  Result Value Ref Range   T3 Uptake 37 (H)  22 - 35 %   T4, Total 6.5 4.9 - 10.5 mcg/dL   Free Thyroxine Index 2.4 1.4 - 3.8   TSH 2.96 0.40 - 4.50 mIU/L  Urinalysis w microscopic + reflex cultur     Status: None   Collection Time: 07/11/23  3:39 PM   Specimen: Blood  Result Value Ref Range   Color, Urine YELLOW YELLOW   APPearance CLEAR CLEAR   Specific Gravity, Urine 1.005 1.001 - 1.035   pH 5.5 5.0 - 8.0   Glucose, UA NEGATIVE NEGATIVE   Bilirubin Urine NEGATIVE NEGATIVE   Ketones, ur NEGATIVE NEGATIVE   Hgb urine dipstick NEGATIVE NEGATIVE   Protein, ur NEGATIVE NEGATIVE   Nitrites, Initial NEGATIVE NEGATIVE   Leukocyte Esterase NEGATIVE NEGATIVE   WBC, UA NONE SEEN 0 - 5 /HPF   RBC / HPF NONE SEEN 0 - 2 /HPF   Squamous Epithelial / HPF NONE SEEN < OR = 5 /HPF   Bacteria, UA NONE SEEN NONE SEEN /HPF   Hyaline Cast NONE SEEN NONE SEEN /LPF   Note      Comment: This urine was analyzed for the presence of WBC,  RBC, bacteria, casts, and other formed elements.  Only those elements seen were reported. . SABRA ROMANS w/Diff     Status: Abnormal   Collection Time: 07/11/23  3:39 PM  Result Value Ref Range   WBC 5.8 4.0 - 10.5 K/uL   RBC 4.56 4.22 - 5.81 Mil/uL   Hemoglobin 14.5 13.0 - 17.0 g/dL   HCT 57.0 60.9 - 47.9 %   MCV 94.1 78.0 - 100.0 fl   MCHC 33.7 30.0 - 36.0 g/dL   RDW 84.3 (H) 88.4 - 84.4 %   Platelets 184.0 150.0 - 400.0 K/uL   Neutrophils Relative % 63.6 43.0 - 77.0 %   Lymphocytes Relative 24.2 12.0 - 46.0 %  Monocytes Relative 10.4 3.0 - 12.0 %   Eosinophils Relative 0.5 0.0 - 5.0 %   Basophils Relative 1.3 0.0 - 3.0 %   Neutro Abs 3.7 1.4 - 7.7 K/uL   Lymphs Abs 1.4 0.7 - 4.0 K/uL   Monocytes Absolute 0.6 0.1 - 1.0 K/uL   Eosinophils Absolute 0.0 0.0 - 0.7 K/uL   Basophils Absolute 0.1 0.0 - 0.1 K/uL  REFLEXIVE URINE CULTURE     Status: None   Collection Time: 07/11/23  3:39 PM  Result Value Ref Range   Reflexve Urine Culture      Comment: NO CULTURE INDICATED  Comprehensive metabolic panel      Status: Abnormal   Collection Time: 07/25/23  5:51 PM  Result Value Ref Range   Sodium 139 135 - 145 mmol/L   Potassium 3.8 3.5 - 5.1 mmol/L   Chloride 100 98 - 111 mmol/L   CO2 26 22 - 32 mmol/L   Glucose, Bld 128 (H) 70 - 99 mg/dL    Comment: Glucose reference range applies only to samples taken after fasting for at least 8 hours.   BUN 11 8 - 23 mg/dL   Creatinine, Ser 8.93 0.61 - 1.24 mg/dL   Calcium  8.8 (L) 8.9 - 10.3 mg/dL   Total Protein 6.6 6.5 - 8.1 g/dL   Albumin 3.3 (L) 3.5 - 5.0 g/dL   AST 27 15 - 41 U/L   ALT 26 0 - 44 U/L   Alkaline Phosphatase 57 38 - 126 U/L   Total Bilirubin 3.1 (H) 0.0 - 1.2 mg/dL   GFR, Estimated >39 >39 mL/min    Comment: (NOTE) Calculated using the CKD-EPI Creatinine Equation (2021)    Anion gap 13 5 - 15    Comment: Performed at [facility], [address] [place], [place] [address]  CBC     Status: None   Collection Time: 07/25/23  5:51 PM  Result Value Ref Range   WBC 5.2 4.0 - 10.5 K/uL   RBC 4.32 4.22 - 5.81 MIL/uL   Hemoglobin 13.7 13.0 - 17.0 g/dL   HCT 60.0 60.9 - 47.9 %   MCV 92.4 80.0 - 100.0 fL   MCH 31.7 26.0 - 34.0 pg   MCHC 34.3 30.0 - 36.0 g/dL   RDW 84.4 88.4 - 84.4 %   Platelets 181 150 - 400 K/uL   nRBC 0.0 0.0 - 0.2 %    Comment: Performed at [facility], [address], [place], [place] [address]  Magnesium      Status: None   Collection Time: 07/25/23  5:51 PM  Result Value Ref Range   Magnesium  1.9 1.7 - 2.4 mg/dL    Comment: Performed at [facility], [address], [place], [place] [address]  Digoxin  level     Status: None   Collection Time: 07/25/23  6:00 PM  Result Value Ref Range   Digoxin  Level 0.8 0.8 - 2.0 ng/mL    Comment: Performed at [facility], [address], [place], [place] [address]  HIV Antibody (routine testing w rflx)     Status: None   Collection Time: 07/26/23  2:52 AM  Result Value Ref Range   HIV Screen 4th Generation wRfx Non Reactive Non Reactive     Comment: Performed at [facility], [address] [place], [place] [address]  Basic metabolic panel     Status: Abnormal   Collection Time: 07/26/23  2:52  AM  Result Value Ref Range   Sodium 139 135 - 145 mmol/L   Potassium 3.3 (L) 3.5 - 5.1 mmol/L   Chloride 100 98 - 111 mmol/L   CO2 30 22 - 32 mmol/L   Glucose, Bld 106 (H) 70 - 99 mg/dL    Comment: Glucose reference range applies only to samples taken after fasting for at least 8 hours.   BUN 12 8 - 23 mg/dL   Creatinine, Ser 8.99 0.61 - 1.24 mg/dL   Calcium  8.4 (L) 8.9 - 10.3 mg/dL   GFR, Estimated >39 >39 mL/min    Comment: (NOTE) Calculated using the CKD-EPI Creatinine Equation (2021)    Anion gap 9 5 - 15    Comment: Performed at [facility], [address] [place], [place] [address]  ECHOCARDIOGRAM COMPLETE     Status: None   Collection Time: 07/26/23  1:50 PM  Result Value Ref Range   Weight 4,160 oz   Height 67 in   BP 112/76 mmHg   Single Plane A2C EF 34.8 %   Single Plane A4C EF 31.3 %   Calc EF 33.7 %   S' Lateral 5.80 cm   Est EF 30 - 35%   MRSA Next Gen by PCR, Nasal     Status: None   Collection Time: 07/26/23  3:17 PM   Specimen: Nasal Mucosa; Nasal Swab  Result Value Ref Range   MRSA by PCR Next Gen NOT DETECTED NOT DETECTED    Comment: (NOTE) The GeneXpert MRSA Assay (FDA approved for NASAL specimens only), is one component of a comprehensive MRSA colonization surveillance program. It is not intended to diagnose MRSA infection nor to guide or monitor treatment for MRSA infections. Test performance is not FDA approved in patients less than 37 years old. Performed at [facility], [address] [place], [place] [address]   Basic metabolic panel     Status: Abnormal   Collection Time: 07/27/23  4:24 AM  Result Value Ref Range   Sodium 138 135 - 145 mmol/L   Potassium 3.9 3.5 - 5.1 mmol/L   Chloride 98 98 - 111 mmol/L   CO2 25 22 - 32 mmol/L   Glucose, Bld 115 (H) 70 - 99 mg/dL     Comment: Glucose reference range applies only to samples taken after fasting for at least 8 hours.   BUN 16 8 - 23 mg/dL   Creatinine, Ser 8.88 0.61 - 1.24 mg/dL   Calcium  9.0 8.9 - 10.3 mg/dL   GFR, Estimated >39 >39 mL/min    Comment: (NOTE) Calculated using the CKD-EPI Creatinine Equation (2021)    Anion gap 15 5 - 15    Comment: Performed at [facility], [address] [place], [place] [address]  Magnesium      Status: None   Collection Time: 07/27/23  4:24 AM  Result Value Ref Range   Magnesium  2.1 1.7 - 2.4 mg/dL    Comment: Performed at [facility], [address] [place], [place] [address]  Basic metabolic panel     Status: Abnormal   Collection Time: 07/28/23  1:26 AM  Result Value Ref Range   Sodium 138 135 - 145 mmol/L   Potassium 3.8 3.5 - 5.1 mmol/L   Chloride 97 (L) 98 - 111 mmol/L   CO2 28 22 - 32 mmol/L   Glucose, Bld 146 (H) 70 - 99 mg/dL    Comment: Glucose reference range applies  only to samples taken after fasting for at least 8 hours.   BUN 22 8 - 23 mg/dL   Creatinine, Ser 8.78 0.61 - 1.24 mg/dL   Calcium  9.4 8.9 - 10.3 mg/dL   GFR, Estimated >39 >39 mL/min    Comment: (NOTE) Calculated using the CKD-EPI Creatinine Equation (2021)    Anion gap 13 5 - 15    Comment: Performed at [facility], [address], [place], [place] [address]  Basic metabolic panel     Status: Abnormal   Collection Time: 07/29/23  2:56 AM  Result Value Ref Range   Sodium 135 135 - 145 mmol/L   Potassium 4.1 3.5 - 5.1 mmol/L   Chloride 98 98 - 111 mmol/L   CO2 23 22 - 32 mmol/L   Glucose, Bld 107 (H) 70 - 99 mg/dL    Comment: Glucose reference range applies only to samples taken after fasting for at least 8 hours.   BUN 28 (H) 8 - 23 mg/dL   Creatinine, Ser 8.95 0.61 - 1.24 mg/dL   Calcium  9.3 8.9 - 10.3 mg/dL   GFR, Estimated >39 >39 mL/min    Comment: (NOTE) Calculated using the CKD-EPI Creatinine Equation (2021)    Anion gap 14 5 - 15     Comment: Performed at [facility], [address], [place], [place] [address]  Basic metabolic panel with GFR     Status: Abnormal   Collection Time: 07/30/23  5:29 AM  Result Value Ref Range   Sodium 136 135 - 145 mmol/L   Potassium 3.9 3.5 - 5.1 mmol/L   Chloride 99 98 - 111 mmol/L   CO2 26 22 - 32 mmol/L   Glucose, Bld 138 (H) 70 - 99 mg/dL    Comment: Glucose reference range applies only to samples taken after fasting for at least 8 hours.   BUN 31 (H) 8 - 23 mg/dL   Creatinine, Ser 8.77 0.61 - 1.24 mg/dL   Calcium  9.3 8.9 - 10.3 mg/dL   GFR, Estimated >39 >39 mL/min    Comment: (NOTE) Calculated using the CKD-EPI Creatinine Equation (2021)    Anion gap 11 5 - 15    Comment: Performed at [facility], [address], [place], [place] [address]  Digoxin  level     Status: None   Collection Time: 08/04/23  3:33 PM  Result Value Ref Range   Digoxin  Level 1.9 0.8 - 2.0 ng/mL    Comment: Performed at [facility], [address] [place], [place] [address]  Basic Metabolic Panel (BMET)     Status: Abnormal   Collection Time: 08/04/23  3:33 PM  Result Value Ref Range   Sodium 135 135 - 145 mmol/L   Potassium 5.4 (H) 3.5 - 5.1 mmol/L   Chloride 99 98 - 111 mmol/L   CO2 25 22 - 32 mmol/L   Glucose, Bld 140 (H) 70 - 99 mg/dL    Comment: Glucose reference range applies only to samples taken after fasting for at least 8 hours.   BUN 36 (H) 8 - 23 mg/dL   Creatinine, Ser 8.46 (H) 0.61 - 1.24 mg/dL   Calcium  10.0 8.9 - 10.3 mg/dL   GFR, Estimated 49 (L) >60 mL/min    Comment: (NOTE) Calculated using the CKD-EPI Creatinine Equation (2021)    Anion gap 11 5 - 15    Comment: Performed at [facility], [address] [place], [place]  [address]  Digoxin  level     Status: None   Collection Time: 08/11/23  3:35 PM  Result Value Ref Range   Digoxin  Level 1.4 0.8 - 2.0 ng/mL    Comment: Performed at [facility], [address] [place], [place] [address]   Basic metabolic panel with GFR     Status: Abnormal   Collection Time: 08/11/23  3:35 PM  Result Value Ref Range   Sodium 134 (L) 135 - 145 mmol/L   Potassium 4.8 3.5 - 5.1 mmol/L   Chloride 96 (L) 98 - 111 mmol/L   CO2 24 22 - 32 mmol/L   Glucose, Bld 118 (H) 70 - 99 mg/dL    Comment: Glucose reference range applies only to samples taken after fasting for at least 8 hours.   BUN 30 (H) 8 - 23 mg/dL   Creatinine, Ser 8.00 (H) 0.61 - 1.24 mg/dL   Calcium  9.8 8.9 - 10.3 mg/dL   GFR, Estimated 35 (L) >60 mL/min    Comment: (NOTE) Calculated using the CKD-EPI Creatinine Equation (2021)    Anion gap 14 5 - 15    Comment: Performed at [facility], [address], [place], [place] [address]  Basic Metabolic Panel (BMET)     Status: Abnormal   Collection Time: 08/19/23  4:21 PM  Result Value Ref Range   Glucose 120 (H) 70 - 99 mg/dL   BUN 26 8 - 27 mg/dL   Creatinine, Ser 8.74 0.76 - 1.27 mg/dL   eGFR 62 >40 fO/fpw/8.26   BUN/Creatinine Ratio 21 10 - 24   Sodium 138 134 - 144 mmol/L   Potassium 4.4 3.5 - 5.2 mmol/L   Chloride 98 96 - 106 mmol/L   CO2 23 20 - 29 mmol/L   Calcium  9.7 8.6 - 10.2 mg/dL        Beverley Adine Hummer, MD, MS

## 2023-08-20 LAB — BASIC METABOLIC PANEL WITH GFR
BUN/Creatinine Ratio: 21 (ref 10–24)
BUN: 26 mg/dL (ref 8–27)
CO2: 23 mmol/L (ref 20–29)
Calcium: 9.7 mg/dL (ref 8.6–10.2)
Chloride: 98 mmol/L (ref 96–106)
Creatinine, Ser: 1.25 mg/dL (ref 0.76–1.27)
Glucose: 120 mg/dL — ABNORMAL HIGH (ref 70–99)
Potassium: 4.4 mmol/L (ref 3.5–5.2)
Sodium: 138 mmol/L (ref 134–144)
eGFR: 62 mL/min/1.73 (ref >59)

## 2023-08-22 ENCOUNTER — Telehealth: Payer: Self-pay | Admitting: Cardiology

## 2023-08-22 NOTE — Telephone Encounter (Signed)
 Spoke with pt. Pt wanted to know about why PCP had AKI on his AVS. Advised patient that would be a better question for his PCP's office. Pt stated understanding.

## 2023-08-22 NOTE — Telephone Encounter (Signed)
 Pt called in stating his PCP told him he has AKI on his dx list when he saw him 08/19/23. However Dr. Jeffrie told him last at his appt that his kidneys were fine. Please advise.

## 2023-08-24 ENCOUNTER — Ambulatory Visit: Payer: Self-pay | Admitting: Family

## 2023-08-25 NOTE — Progress Notes (Signed)
 Spoke to patient and went over labs. Pt verbalized understanding

## 2023-09-05 ENCOUNTER — Other Ambulatory Visit: Payer: Self-pay | Admitting: Internal Medicine

## 2023-09-05 DIAGNOSIS — M1A0420 Idiopathic chronic gout, left hand, without tophus (tophi): Secondary | ICD-10-CM

## 2023-09-05 NOTE — Telephone Encounter (Signed)
 Last Fill: 06/01/2023  Labs: 07/25/2023 Glucose 128, Calcium  8.8, Albumin 3.3,Total Bilirubin 3.1   Next Visit: 10/18/2023  Last Visit: 04/12/2023  DX: Chronic gout of left hand, unspecified cause   Current Dose per office note 04/12/2023: allopurinol  200 mg daily   Okay to refill Allopurinol ?

## 2023-09-08 ENCOUNTER — Other Ambulatory Visit: Payer: Self-pay | Admitting: Cardiology

## 2023-09-08 DIAGNOSIS — I480 Paroxysmal atrial fibrillation: Secondary | ICD-10-CM

## 2023-09-08 NOTE — Telephone Encounter (Signed)
 Prescription refill request for Xarelto  received.  Indication: Afib  Last office visit: 07/25/23 Cecile)  Weight: 105.2kg Age: 67 Scr: 1.25 (08/19/23)  CrCl: 81.19ml/min Appropriate dose. Refill sent.

## 2023-09-12 NOTE — Discharge Summary (Addendum)
 Physician Discharge Summary  Norman Rodriguez FMW:[id number] DOB: 1955/04/20 DOA: 07/25/2023  PCP: [name], MD  Admit date: 07/25/2023 Discharge date: 07/30/2023  Time spent: 45 minutes  Recommendations for Outpatient Follow-up:  Cards Dr.Skains in few weeks BMP in 1 week   Discharge Diagnoses:  Principal Problem:   Acute on chronic systolic (congestive) heart failure (HCC) Active Problems:   CAD (coronary artery disease)   HTN (hypertension)   PAF (paroxysmal atrial fibrillation) (HCC)   COPD (chronic obstructive pulmonary disease) (HCC)   Hypokalemia   Hyperlipidemia   Chronic deep vein thrombosis (DVT) of femoral vein of right lower extremity (HCC)   Obesity, class 3   Discharge Condition: improved  Diet recommendation: heart healthy  Filed Weights   07/28/23 0500 07/29/23 0402 07/30/23 0500  Weight: 113.3 kg 105.7 kg 110 kg    History of present illness:  68/M w/ systolic CHF, PAfib, CAD and COPD who presented with worsening exertional dyspnea and lower extremity edema, along with orthopnea and PND.  -6/30 seen in clinic, sent to ED, Vol overloaded, labs cr 1,0  CXR w bilateral hilar vascular congestion and central interstitial infiltrates bilaterally, small left pleural effusion EKG afib rate controlled  Hospital Course:   Acute on chronic systolic CHF -echo 2022  w/ EF 30 to 35%, akinesis of the LV entire inferior wall, inferolateral wall and inferoseptal wall. RV with mild reduction,  -Repeat echo with EF 30-35% with wall motion abnormality, RV low normal, moderate MR Improving with diuresis, 15 L negative, weight down 26 LB -Cards following, changed to po lasix , continue Coreg , Jardiance , Entresto , Aldactone  and digoxin  -FU with Dr.Skains in 3-4weeks   History of VT, defibrillator in place,  -keep K at 4 and Mg at 2.  - Continue Coreg    CAD (coronary artery disease) Continue atorvastatin     HTN (hypertension) As above   Permanent atrial  fibrillation) (HCC) Atrial fibrillation rhythm Continue carvedilol  and rivaroxaban    Hypokalemia -repleted    COPD (chronic obstructive pulmonary disease) (HCC) No signs of acute exacerbation    Hyperlipidemia Continue with statin therapy    Chronic deep vein thrombosis (DVT) of femoral vein of right lower extremity (HCC) Continue with rivaroxaban .   Obesity, class 3 Calculated BMI 40,7   Discharge Exam: Vitals:   07/30/23 0500 07/30/23 0725  BP:    Pulse: (!) 50   Resp: (!) 21   Temp:  98.5 F (36.9 C)  SpO2: 93% 93%   Gen: Awake, Alert, Oriented X 3,  HEENT: no JVD Lungs: Good air movement bilaterally, CTAB CVS: S1S2/Irregular Abd: soft, Non tender, non distended, BS present Extremities: No edema Skin: no new rashes on exposed skin   Discharge Instructions   Discharge Instructions     Diet - low sodium heart healthy   Complete by: As directed    Increase activity slowly   Complete by: As directed       Allergies as of 07/30/2023       Reactions   Apple Anaphylaxis   Apple Juice Anaphylaxis   Soybean Oil Other (See Comments)   unknown   Flaxseed (linseed)    Lisinopril Cough   Other Itching   Pt has itching and joint swelling with walnuts, mildew, fungi, basil and peaches   Bean Pod Extract Rash   Green Beans specifically Joint swelling and itching   Carrot [daucus Carota] Rash   Joint swelling and itching   Grass Extracts [gramineae Pollens] Rash   Joint swelling  and itching   Peach Flavoring Agent (non-screening) Rash   Joint swelling and itching   Tree Extract Rash   Joint swelling and itching        Medication List     STOP taking these medications    carvedilol  25 MG tablet Commonly known as: COREG        TAKE these medications    amiodarone  200 MG tablet Commonly known as: PACERONE  Take 1 tablet (200 mg total) by mouth daily. 400 MG TWICE A DAY 2 WEEKS 200 MG TWICE A DAY 2 WEEKS THEN DAILY   ascorbic acid  500 MG  tablet Commonly known as: VITAMIN C  Take 500 mg by mouth daily.   atorvastatin  40 MG tablet Commonly known as: LIPITOR  TAKE ONE TABLET BY MOUTH DAILY   Co Q-10 200 MG Caps Take 200 mg by mouth daily.   Colchicine  0.6 MG Caps Day 1: Take 1 caps. If still having swelling, may take 1 cap the following day.   digoxin  0.125 MG tablet Commonly known as: LANOXIN  TAKE ONE TABLET BY MOUTH DAILY   empagliflozin  10 MG Tabs tablet Commonly known as: JARDIANCE  Take 1 tablet (10 mg total) by mouth daily.   fexofenadine 180 MG tablet Commonly known as: ALLEGRA Take 180 mg by mouth daily.   furosemide  40 MG tablet Commonly known as: LASIX  Take 1 tablet (40 mg total) by mouth 2 (two) times daily. What changed: when to take this   gabapentin  300 MG capsule Commonly known as: NEURONTIN  Take 300 mg by mouth at bedtime as needed (sleep nerve pain).   multivitamin tablet Take 1 tablet by mouth daily.   nitroGLYCERIN  0.4 MG SL tablet Commonly known as: NITROSTAT  Place 1 tablet (0.4 mg total) under the tongue every 5 (five) minutes as needed for chest pain.   OMEGA 3 PO Take 1 capsule by mouth daily.   sacubitril -valsartan  24-26 MG Commonly known as: ENTRESTO  Take 1 tablet by mouth 2 (two) times daily.   VITAMIN D (CHOLECALCIFEROL ) PO Take 5,000 Units by mouth daily.       Allergies  Allergen Reactions   Apple Anaphylaxis   Apple Juice Anaphylaxis   Soybean Oil Other (See Comments)    unknown   Flaxseed (Linseed)    Lisinopril Cough   Other Itching    Pt has itching and joint swelling with walnuts, mildew, fungi, basil and peaches   Bean Pod Extract Rash    Green Beans specifically Joint swelling and itching   Carrot [Daucus Carota] Rash    Joint swelling and itching   Grass Extracts [Gramineae Pollens] Rash    Joint swelling and itching   Peach Flavoring Agent (Non-Screening) Rash    Joint swelling and itching   Tree Extract Rash    Joint swelling and itching     Follow-up Information     [facility]. Go in 6 day(s).   Specialty: Cardiology Why: Hospital follow up 08/04/2023 @ 2:45 pm PLEASE bring a current medication list to appointment FREE valet parking, Entrance C, Off [employer] [facility] and [facility] entrance. Contact information: [address] [place]  [phone]                 The results of significant diagnostics from this hospitalization (including imaging, microbiology, ancillary and laboratory) are listed below for reference.    Significant Diagnostic Studies: No results found.  Microbiology: No results found for this or any  previous visit (from the past 240 hours).   Labs: Basic Metabolic Panel: No results for input(s): NA, K, CL, CO2, GLUCOSE, BUN, CREATININE, CALCIUM , MG, PHOS in the last 168 hours. Liver Function Tests: No results for input(s): AST, ALT, ALKPHOS, BILITOT, PROT, ALBUMIN in the last 168 hours. No results for input(s): LIPASE, AMYLASE in the last 168 hours. No results for input(s): AMMONIA in the last 168 hours. CBC: No results for input(s): WBC, NEUTROABS, HGB, HCT, MCV, PLT in the last 168 hours. Cardiac Enzymes: No results for input(s): CKTOTAL, CKMB, CKMBINDEX, TROPONINI in the last 168 hours. BNP: BNP (last 3 results) No results for input(s): BNP in the last 8760 hours.  ProBNP (last 3 results) Recent Labs    10/11/22 1620 01/11/23 1500 07/11/23 1539  PROBNP 2,459* 1,520* 679.0*    CBG: No results for input(s): GLUCAP in the last 168 hours.     Signed:  Sigurd Pac MD.  [facility] 09/12/2023, 2:07 PM

## 2023-09-19 ENCOUNTER — Telehealth: Payer: Self-pay | Admitting: Cardiology

## 2023-09-19 ENCOUNTER — Telehealth: Payer: Self-pay | Admitting: Family Medicine

## 2023-09-19 NOTE — Telephone Encounter (Signed)
 Pt c/o medication issue:  1. Name of Medication: empagliflozin  (JARDIANCE ) 10 MG TABS tablet  spironolactone  (ALDACTONE ) 25 MG tablet   2. How are you currently taking this medication (dosage and times per day)?   3. Are you having a reaction (difficulty breathing--STAT)? No  4. What is your medication issue? Pt calling to make provider aware that he has stopped taking above medications.Reason being is that Jardiance ( stopped a few weeks ago) caused dizziness and Spironolactone ( stopped 3 days ago) caused diarrhea. Please advise

## 2023-09-19 NOTE — Telephone Encounter (Signed)
 Spoke with pt who reports while taking Jardiance  he has frequent dizziness and while taking spironolactone  he had severe diarrhea - which would wake him from sleep during the night.  He discontinue this medication and symptoms have resolved.  Medication moved from pt's list.  He will call back if any questions or concerns.

## 2023-09-19 NOTE — Telephone Encounter (Signed)
 Copied from CRM #[id number]. Topic: Clinical - Medication Question >> Sep 19, 2023 12:31 PM Lavanda D wrote: Reason for CRM: Patient had bad side effects while taking both spironolactone  & jardiance . Severe diarrhea while on spironolactone , stopped taking it and the diarrhea stopped - has not had any in 3 days. Patient also will not be taking Jardiance  due to side effects. Patient would like noted in his chart that he will not be taking either of these medications.

## 2023-09-20 NOTE — Telephone Encounter (Signed)
 Noted. Rx was removed from patient medication list and RX prescriber is aware Cecile, Oneil BROCKS, MD Cardiology) Please see encounter.

## 2023-09-29 ENCOUNTER — Telehealth: Payer: Self-pay

## 2023-09-29 NOTE — Telephone Encounter (Signed)
 Copied from CRM [id number]. Topic: Clinical - Medical Advice >> Sep 29, 2023 12:26 PM Thersia BROCKS wrote: Reason for CRM: Patient called in regarding the prescription that Dr.Thompson has prescribes states he doesnt feel like its getting anybetter would like for a nurse to give him a call to talk about maybe seeing a podiatrist

## 2023-09-29 NOTE — Telephone Encounter (Signed)
 Called patient to clarify referral request for podiatry; left VM for call back

## 2023-09-30 ENCOUNTER — Ambulatory Visit: Payer: Self-pay

## 2023-09-30 NOTE — Telephone Encounter (Signed)
 FYI Only or Action Required?: FYI only for provider.  Patient was last seen in primary care on 08/19/2023 by [name], MD.  Called Nurse Triage reporting Toe Pain.  Symptoms began several months ago.  Interventions attempted: Nothing.  Symptoms are: stable.  Triage Disposition: See PCP Within 2 Weeks  Patient/caregiver understands and will follow disposition?: Yes   Reason for Disposition  [1] MILD pain (e.g., does not interfere with normal activities) AND [2] present > 7 days  Answer Assessment - Initial Assessment Questions Denies office visit at this time, states he will call back to schedule due to his insurance.   1. ONSET: When did the pain start?      Since ED visit in beginning of July   2. LOCATION: Where is the pain located?   (e.g., around nail, entire toe, at foot joint)      Right big toe  3. PAIN: How bad is the pain?    (Scale 1-10; or mild, moderate, severe)     Slight pain when standing up or laying in bed, bumped it with a hammer, but no pain, states notices it feels different than the other toes  4. APPEARANCE: What does the toe look like? (e.g., redness, swelling, bruising, pallor)     Denies swelling, reddish. 5. CAUSE: What do you think is causing the toe pain?     Unsure  6. OTHER SYMPTOMS: Do you have any other symptoms? (e.g., leg pain, rash, fever, numbness)     Slightly numb.  Protocols used: Toe Pain-A-AH  Copied from CRM [id number]. Topic: Clinical - Red Word Triage >> Sep 30, 2023  2:43 PM Rosina BIRCH wrote: Red Word that prompted transfer to Nurse Triage: patient called stating he has pain in his right big toe and it is throbbing and it has been purplish/bluish. Patient stated it is not getting better

## 2023-10-03 ENCOUNTER — Ambulatory Visit (INDEPENDENT_AMBULATORY_CARE_PROVIDER_SITE_OTHER): Payer: Medicare Other

## 2023-10-03 DIAGNOSIS — I5022 Chronic systolic (congestive) heart failure: Secondary | ICD-10-CM | POA: Diagnosis not present

## 2023-10-03 NOTE — Telephone Encounter (Signed)
 Called patient to schedule OV; left VM for call back.

## 2023-10-03 NOTE — Telephone Encounter (Signed)
 Copied from CRM [id number]. Topic: General - Other >> Sep 30, 2023  2:41 PM Rosina BIRCH wrote: Reason for CRM: patient returning a call to [facility]

## 2023-10-03 NOTE — Telephone Encounter (Signed)
 Will do!

## 2023-10-03 NOTE — Telephone Encounter (Signed)
 Returned phone called; left VM for call back to schedule OV

## 2023-10-04 LAB — CUP PACEART REMOTE DEVICE CHECK
Battery Remaining Longevity: 94 mo
Battery Remaining Percentage: 77 %
Battery Voltage: 2.99 V
Brady Statistic RV Percent Paced: 15 %
Date Time Interrogation Session: 20250908020254
HighPow Impedance: 63 Ohm
Implantable Lead Connection Status: 753985
Implantable Lead Implant Date: 20221213
Implantable Lead Location: 753860
Implantable Pulse Generator Implant Date: 20221213
Lead Channel Impedance Value: 360 Ohm
Lead Channel Pacing Threshold Amplitude: 1 V
Lead Channel Pacing Threshold Pulse Width: 0.5 ms
Lead Channel Sensing Intrinsic Amplitude: 7.6 mV
Lead Channel Setting Pacing Amplitude: 2.5 V
Lead Channel Setting Pacing Pulse Width: 0.5 ms
Lead Channel Setting Sensing Sensitivity: 0.5 mV
Pulse Gen Serial Number: 810054243

## 2023-10-04 NOTE — Progress Notes (Signed)
 Office Visit Note  Patient: Norman Rodriguez             Date of Birth: Jan 18, 1956           MRN: [id number]             PCP: [name], MD Referring: [name], MD Visit Date: 10/18/2023   Subjective:  Medical Management of Chronic Issues (Has a list of concerns )   Discussed the use of AI scribe software for clinical note transcription with the patient, who gave verbal consent to proceed.  History of Present Illness   Norman Rodriguez is a 68 y.o. male here for follow up gout, edema, and osteoarthritis on allopurinol  200 mg daily.   He has been experiencing discoloration and pain in his big toe, which has progressed from a reddish-purple to a lobster-like color. This change was noticed after the removal of foot wraps following a hospital stay. The discoloration has persisted and is now spreading to the second toe. Pain occurs when standing and sometimes while lying in bed, described as a vibrating sensation in the foot.  He has a history of gout and is on allopurinol  100 mg twice daily. Recent medication changes include the discontinuation of spironolactone  and Jardiance  due to severe diarrhea and dizziness. His carvedilol  dosage was reduced from 25 mg to 12.5 mg. He experiences itching and pain in his hands, which he associates with gout flares, although uric acid levels were reportedly normal in March.  He has a history of heart failure with reduced ejection fraction and has been on medications such as carvedilol  and Entresto . He experiences dizziness and falls, which he associates with his heart condition.  He also has a long-standing issue with onychomycosis of the toenail, which is yellow and thickened. He has attempted topical treatments but finds them difficult to apply consistently.       Previous HPI 04/12/2023 Norman Rodriguez is a 68 year old male with gout, edema, and osteoarthritis on allopurinol  200 mg daily. He has not required ongoing use of  colchicine  or had any major gout flares. Most recent uric acid level was 6.5 prior to increase to this dose.   He experiences joint pain and stiffness, particularly in his hands, which sometimes prevents him from closing his fingers completely. He describes the sensation as 'squeezing a sponge' and notes variability in symptoms, with some days being fine and others more problematic. The last two days have been better, but previously he could not close his fingers all the way.   He reports a history of osteoarthritis affecting multiple joints, which contributes to his joint pain and stiffness. He is uncertain about the source of his pain, whether it is due to gout, osteoarthritis, or another condition.   He mentions a recent fall last Friday, which exacerbated his back pain, but notes improvement over the last two days.   He describes a sensation of numbness and tightness in his hands, which he associates with carpal tunnel syndrome, noting that his hand felt like it was in a vise for two to three days.   He experiences edema, particularly in his legs, and is on Lasix  for this condition. He describes visible veins on his legs and feet, which he attributes to blood flow issues.   Previous HPI 10/28/22 Norman Rodriguez is a 68 y.o. male here for evaluation and management of joint pain with osteoarthritis and gout.  Particularly with episodes of acute joint pain  with associated stiffness and swelling started around February of this year first at his left third PIP joint started acutely shortly after midnight without any preceding injury he had eaten a large shrimp dinner at [employer].  Subsequently he had a few more episodes of this acute onset joint pain this year. Sometimes preceded by eating shrimp and he was initially treated with colchicine  that improved the inflammation within a few days.  He did have a significant amount of diarrhea from the medication.  He saw his PCP Dr. Sebastian for  evaluation of this in May with lab workup for inflammatory arthritis had a uric acid of 8.2 otherwise pretty unremarkable per patient also questions possibility of psoriatic arthritis.  He started on allopurinol  100 mg daily has not experienced additional flareups of finger swelling since July.  He had a repeat uric acid checked at that visit of 6.5.  So far tolerating the medicine fine without additional exacerbations. Besides these flareups of joint inflammation he has chronic joint pain in multiple areas has been attributed to primary and posttraumatic osteoarthritis.  Did previous surgical fixation of right ankle fractures.  Also has a problem with numbness affecting his left hand especially the fourth and fifth fingers.  He describes diminished sensation pretty much all the time and sometimes gets symptoms up to the level of the elbow most often in the morning or sometimes wakes him at night.  He takes gabapentin  and Tylenol  which are partially helpful cannot take NSAIDs due to his long-term anticoagulation. He has significant cardiovascular disease with systolic congestive heart failure on furosemide  40 mg daily for this.  He is in permanent atrial fibrillation and has had several episodes of ventricular tachycardia this year.  He did not tolerate amiodarone  he was recently started on mexiletine.  BNP was elevated at 2459 last month.  He recently experienced a shock from his ICD on Saturday for Vtach which was the first time for this.     Labs reviewed 09/2022 Pro-BNP 2459   07/2022 Uric acid 6.5   05/2022 Uric acid 8.2 ANA neg RF neg HLA-B27 neg ESR 15 CRP <1  Review of Systems  Constitutional:  Negative for fatigue.  HENT:  Negative for mouth sores and mouth dryness.   Eyes:  Positive for dryness.  Respiratory:  Positive for shortness of breath.   Cardiovascular:  Positive for palpitations. Negative for chest pain.  Gastrointestinal:  Negative for blood in stool, constipation and  diarrhea.  Endocrine: Positive for increased urination.  Genitourinary:  Negative for involuntary urination.  Musculoskeletal:  Positive for joint pain, gait problem, joint pain and muscle weakness. Negative for joint swelling, myalgias, morning stiffness, muscle tenderness and myalgias.  Skin:  Positive for color change. Negative for rash, hair loss and sensitivity to sunlight.  Allergic/Immunologic: Negative for susceptible to infections.  Neurological:  Positive for dizziness and headaches.  Hematological:  Negative for swollen glands.  Psychiatric/Behavioral:  Positive for depressed mood. Negative for sleep disturbance. The patient is not nervous/anxious.     PMFS History:  Patient Active Problem List   Diagnosis Date Noted   Great toe pain, right 10/18/2023   Obesity, class 3 07/26/2023   Hypokalemia 07/26/2023   Acute on chronic systolic (congestive) heart failure (HCC) 07/25/2023   Chronic deep vein thrombosis (DVT) of femoral vein of right lower extremity (HCC) 07/11/2023   Orthostatic dizziness 07/11/2023   Chronic gout of hand 06/16/2022   Finger swelling 06/13/2022   Post-traumatic osteoarthritis of multiple joints  06/01/2021   Toe pain, left 06/01/2021   Carpal tunnel syndrome of left wrist 06/01/2021   BMI 39.0-39.9,adult 06/01/2021   Ventricular tachycardia (HCC)    COPD (chronic obstructive pulmonary disease) (HCC) 10/22/2017   PAF (paroxysmal atrial fibrillation) (HCC) 05/08/2014   Chronic systolic heart failure (HCC) 03/29/2013   Cardiomyopathy (HCC)    CAD (coronary artery disease)    Chronic anticoagulation    NSVT (nonsustained ventricular tachycardia) (HCC)    Hyperlipidemia    HTN (hypertension)     Past Medical History:  Diagnosis Date   ACS (acute coronary syndrome) (HCC)    Arthritis    Atrial fibrillation (HCC)    a. Dx 2010. b. Initiated on Tikosyn  03/2013.   BRBPR (bright red blood per rectum) 10/22/2017   CAD (coronary artery disease)    a. BMS  2.40mm 23 to LAD. CTO of RCA unsuccessful, good left to right collaterals - 2010.   Chronic anticoagulation    Chronic systolic CHF (congestive heart failure) (HCC)    a. Both ischemic and tachy induced. b. Echo 04/2013: EF 30-35%, no RWMA, normal RV, mod dilated LA (before restoration of NSR).   COPD (chronic obstructive pulmonary disease) (HCC) 10/22/2017   Dysrhythmia    hx of atrial fibrilation   HTN (hypertension)    Hyperlipidemia    Melena    NSTEMI (non-ST elevated myocardial infarction) (HCC) 01/05/2021   NSVT (nonsustained ventricular tachycardia) (HCC)    Obesity    Polyp of cecum    Polyp of descending colon    Polyp of rectum    Polyp of transverse colon    Shortness of breath     Family History  Problem Relation Age of Onset   Other Father        Car accident   Coronary artery disease Neg Hx    Past Surgical History:  Procedure Laterality Date   CARDIAC DEFIBRILLATOR PLACEMENT  2024   CARDIOVERSION N/A 04/27/2013   Procedure: BEDSIDE CARDIOVERSION;  Surgeon: Jerel Balding, MD;  Location: [facility] OR;  Service: Cardiovascular;  Laterality: N/A;   CARDIOVERSION N/A 05/15/2013   Procedure: CARDIOVERSION/BEDSIDE;  Surgeon: Ezra GORMAN Shuck, MD;  Location: [facility] OR;  Service: Cardiovascular;  Laterality: N/A;   CARDIOVERSION N/A 05/16/2014   Procedure: CARDIOVERSION;  Surgeon: Oneil JAYSON Parchment, MD;  Location: [facility] ENDOSCOPY;  Service: Cardiovascular;  Laterality: N/A;   CARDIOVERSION N/A 06/07/2014   Procedure: CARDIOVERSION;  Surgeon: Oneil JAYSON Parchment, MD;  Location: [facility] ENDOSCOPY;  Service: Cardiovascular;  Laterality: N/A;   COLONOSCOPY WITH PROPOFOL  N/A 10/27/2017   Procedure: COLONOSCOPY WITH PROPOFOL ;  Surgeon: Shila Gustav GAILS, MD;  Location: [facility] ENDOSCOPY;  Service: Endoscopy;  Laterality: N/A;   CORONARY ANGIOPLASTY WITH STENT PLACEMENT  01/26/2008   BMS 2.50mm 23 to LAD. CTO of RCA unsuccessful, good left to right collaterals   ESOPHAGOGASTRODUODENOSCOPY (EGD) WITH PROPOFOL  N/A  10/24/2017   Procedure: ESOPHAGOGASTRODUODENOSCOPY (EGD) WITH PROPOFOL ;  Surgeon: Shila Gustav GAILS, MD;  Location: [facility] ENDOSCOPY;  Service: Endoscopy;  Laterality: N/A;   FOOT SURGERY Right    ICD IMPLANT N/A 01/06/2021   Procedure: ICD IMPLANT;  Surgeon: Inocencio Soyla Lunger, MD;  Location: [facility] INVASIVE CV LAB;  Service: Cardiovascular;  Laterality: N/A;   LEFT HEART CATH AND CORONARY ANGIOGRAPHY N/A 01/05/2021   Procedure: LEFT HEART CATH AND CORONARY ANGIOGRAPHY;  Surgeon: Court Dorn PARAS, MD;  Location: [facility] INVASIVE CV LAB;  Service: Cardiovascular;  Laterality: N/A;   POLYPECTOMY  10/27/2017   Procedure: POLYPECTOMY;  Surgeon:  Nandigam, Kavitha V, MD;  Location: [facility] ENDOSCOPY;  Service: Endoscopy;;   Social History   Social History Narrative   Not on file   Immunization History  Administered Date(s) Administered   Fluad Quad(high Dose 65+) 11/11/2022   Influenza,inj,Quad PF,6+ Mos 10/23/2017   Influenza,inj,quad, With Preservative 12/13/2016   PFIZER(Purple Top)SARS-COV-2 Vaccination 04/21/2019, 05/12/2019, 11/16/2019   Pfizer(Comirnaty)Fall Seasonal Vaccine 12 years and older 11/11/2022   Pneumococcal Polysaccharide-23 06/22/2018   Tdap 08/14/2021   Zoster Recombinant(Shingrix) 08/14/2021, 01/29/2022     Objective: Vital Signs: BP (!) 96/55   Pulse 62   Temp 98.2 F (36.8 C)   Resp 18   Ht 5' 7 (1.702 m)   Wt 245 lb 9.6 oz (111.4 kg)   BMI 38.47 kg/m    Physical Exam Constitutional:      Appearance: He is obese.  Eyes:     Conjunctiva/sclera: Conjunctivae normal.  Cardiovascular:     Rate and Rhythm: Normal rate and regular rhythm.  Pulmonary:     Effort: Pulmonary effort is normal.     Breath sounds: Normal breath sounds.  Lymphadenopathy:     Cervical: No cervical adenopathy.  Skin:    General: Skin is warm and dry.     Comments: Numerous torturous superficial veins at both feet and ankles Hyperpigmentation in lower legs extends less than halfway to the  knees, lipodermatosclerosis changes R>L pitting pedal edema Right big toe redness, onychomycotic nail changes  Neurological:     Mental Status: He is alert.  Psychiatric:        Mood and Affect: Mood normal.     Musculoskeletal Exam:  Elbows full ROM no swelling no nodules Wrists full ROM no tenderness or swelling Fingers chronic appearing widening at MCP joints, lateral deviation of 3rd PIP, no palpable effusion and minimal tenderness to pressure Knees no palpable effusions, patellofemoral crepitus b/l Right ankle medial surgical scar and decreased range of motion   Investigation: No additional findings.  Imaging: DG Foot Complete Right Result Date: 10/18/2023 CLINICAL DATA:  Chronic right great toe pain with discoloration. EXAM: RIGHT FOOT COMPLETE - 3+ VIEW COMPARISON:  None Available. FINDINGS: Plate and screw fixation noted in the distal tibia and fibula. Plantar calcaneal spur. No acute bony abnormality. Specifically, no fracture, subluxation, or dislocation. No bone destruction to suggest osteomyelitis. Soft tissues are intact. IMPRESSION: No acute bony abnormality. Electronically Signed   By: Franky Crease M.D.   On: 10/18/2023 22:25   CUP PACEART REMOTE DEVICE CHECK Result Date: 10/04/2023 ICD Scheduled remote reviewed. Normal device function.  Presenting rhythm: VS. 2 VHR episodes since 08/04/23 reprogramming session, longest 8 sec on 09/28/23 at 07:45 am, avg V rates 155-213 bpm, no EGMs available for review. Next remote 91 days. [facility], [facility]   Recent Labs: Lab Results  Component Value Date   WBC 5.2 07/25/2023   HGB 13.7 07/25/2023   PLT 181 07/25/2023   NA 138 08/19/2023   K 4.4 08/19/2023   CL 98 08/19/2023   CO2 23 08/19/2023   GLUCOSE 120 (H) 08/19/2023   BUN 26 08/19/2023   CREATININE 1.25 08/19/2023   BILITOT 3.1 (H) 07/25/2023   ALKPHOS 57 07/25/2023   AST 27 07/25/2023   ALT 26 07/25/2023   PROT 6.6 07/25/2023   ALBUMIN 3.3 (L) 07/25/2023   CALCIUM  9.7  08/19/2023   GFRAA 98 09/25/2018    Speciality Comments: No specialty comments available.  Procedures:  No procedures performed Allergies: Apple, Apple juice, Soybean oil, Flaxseed (  linseed), Lisinopril, Other, Bean pod extract, Carrot [daucus carota], Grass extracts [gramineae pollens], Peach flavoring agent (non-screening), and Tree extract   Assessment / Plan:     Visit Diagnoses: Chronic gout of left hand, unspecified cause - allopurinol  200 mg daily3/18/2025-Uric Acid-5.7 Currently on allopurinol  100 mg twice daily. Recent medication changes may affect uric acid levels. Re-evaluation needed to adjust allopurinol  dosage. - Order blood test to check uric acid levels. - Adjust allopurinol  dosage based on uric acid levels.  Chronic systolic heart failure (HCC) Chronic systolic heart failure with reduced ejection fraction Carvedilol  dosage reduced due to concerns about heart contractility. Reduced ejection fraction indicates inefficient left ventricle pumping. Avoid medications decreasing heart contractility. Very minimal edema, today but chronic venous stasis changes.  Suspected peripheral vascular disease of the foot Discoloration and pain in big toe, possibly spreading. X-ray showed no bone abnormalities. Referral to vascular specialist planned. No particular lesions or red flags on exam currently.       Orders: Orders Placed This Encounter  Procedures   Sedimentation rate   Uric acid   No orders of the defined types were placed in this encounter.    Follow-Up Instructions: Return in about 6 months (around 04/16/2024) for Gout on allprinol f/u 6mos.   Norman LELON Ester, MD  Note - This record has been created using [employer].  Chart creation errors have been sought, but may not always  have been located. Such creation errors do not reflect on  the standard of medical care.

## 2023-10-05 ENCOUNTER — Ambulatory Visit: Payer: Self-pay | Admitting: Cardiology

## 2023-10-06 ENCOUNTER — Other Ambulatory Visit: Payer: Self-pay | Admitting: Cardiology

## 2023-10-12 ENCOUNTER — Encounter: Payer: Self-pay | Admitting: Family Medicine

## 2023-10-12 ENCOUNTER — Ambulatory Visit (INDEPENDENT_AMBULATORY_CARE_PROVIDER_SITE_OTHER): Admitting: Family Medicine

## 2023-10-12 ENCOUNTER — Telehealth: Payer: Self-pay | Admitting: Cardiology

## 2023-10-12 ENCOUNTER — Ambulatory Visit

## 2023-10-12 VITALS — BP 96/48 | HR 50 | Temp 97.7°F | Resp 18 | Wt 244.4 lb

## 2023-10-12 DIAGNOSIS — G8929 Other chronic pain: Secondary | ICD-10-CM | POA: Diagnosis not present

## 2023-10-12 DIAGNOSIS — M79674 Pain in right toe(s): Secondary | ICD-10-CM

## 2023-10-12 DIAGNOSIS — M7731 Calcaneal spur, right foot: Secondary | ICD-10-CM | POA: Diagnosis not present

## 2023-10-12 DIAGNOSIS — I739 Peripheral vascular disease, unspecified: Secondary | ICD-10-CM

## 2023-10-12 MED ORDER — CARVEDILOL 6.25 MG PO TABS: 6.2500 mg | ORAL_TABLET | Freq: Two times a day (BID) | ORAL | 0 refills | Status: DC

## 2023-10-12 NOTE — Progress Notes (Signed)
 Assessment & Plan   Assessment/Plan:    Assessment & Plan Chronic right great toe pain with discoloration Chronic pain in the right great toe with recent spread to the adjacent toe and discoloration. Possible differential includes gout, osteoarthritis, or vascular issues such as blue toe syndrome. Previous treatment with antibiotics for suspected cellulitis in July. No imaging of the toe has been done previously. Blood flow appears adequate, but further assessment is warranted due to discoloration. - Order x-ray of the right great toe - Refer to podiatry for further evaluation - Order ultrasound with ABI to assess blood flow to the legs  Symptomatic bradycardia and hypotension due to beta-blocker therapy Symptomatic bradycardia and hypotension likely secondary to carvedilol  therapy. Current dose of carvedilol  is 12.5 mg twice daily, which has already been reduced once. Heart rate is 50 bpm, and blood pressure is 96/48 mmHg, contributing to dizziness. Reducing carvedilol  may help improve heart rate and blood pressure without significantly affecting atrial fibrillation control. - Reduce carvedilol  to 6.25 mg twice daily - Monitor blood pressure and heart rate at upcoming appointment with Dr. 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      Medications Discontinued During This Encounter  Medication Reason   JARDIANCE  10 MG TABS tablet    carvedilol  (COREG ) 12.5 MG tablet             Subjective:   Encounter date: 10/12/2023  Norman Rodriguez is a 68 y.o. male who has Cardiomyopathy (HCC); CAD (coronary artery disease); Chronic anticoagulation; NSVT (nonsustained ventricular tachycardia) (HCC); Hyperlipidemia; HTN (hypertension); Chronic systolic heart failure (HCC); PAF (paroxysmal atrial  fibrillation) (HCC); COPD (chronic obstructive pulmonary disease) (HCC); Ventricular tachycardia (HCC); Post-traumatic osteoarthritis of multiple joints; Toe pain, left; Carpal tunnel syndrome of left wrist; BMI 39.0-39.9,adult; Finger swelling; Chronic gout of hand; Chronic deep vein thrombosis (DVT) of femoral vein of right lower extremity (HCC); Orthostatic dizziness; Acute on chronic systolic (congestive) heart failure (HCC); Obesity, class 3; and Hypokalemia on their problem list..   He  has a past medical history of ACS (acute coronary syndrome) (HCC), Arthritis, Atrial fibrillation (HCC), BRBPR (bright red blood per rectum) (10/22/2017), CAD (coronary artery disease), Chronic anticoagulation, Chronic systolic CHF (congestive heart failure) (HCC), COPD (chronic obstructive pulmonary disease) (HCC) (10/22/2017), Dysrhythmia, HTN (hypertension), Hyperlipidemia, Melena, NSTEMI (non-ST elevated myocardial infarction) (HCC) (01/05/2021), NSVT (nonsustained ventricular tachycardia) (HCC), Obesity, Polyp of cecum, Polyp of descending colon, Polyp of rectum, Polyp of transverse colon, and Shortness of breath.SABRA   He presents with chief complaint of Toe Pain (Pt c/o of right big toe pain, redness and discoloration for 3 months//HM due- vaccinations and colonoscopy ) and Eye Problem (Pt c/o of left lower eye redness for 1 week; no itchiness or pain is present ) .   Discussed the use of AI scribe software for clinical note transcription with the patient, who gave verbal consent to proceed.  History of Present Illness Norman Rodriguez is a 68 year old male with atrial fibrillation and chronic heart failure who presents with ongoing right great toe pain.  Right great toe pain and discoloration - Ongoing pain in the right great toe, now spreading to the adjacent toe - Toes feel different compared to other toes - Blueness observed in the affected toes - Pain is most noticeable at night, especially when in  bed - Standing up provides some  relief from pain - Received a 14-day course of antibiotics in July for suspected toe infection, initially thought to be cellulitis  Dizziness and hypotension - Low blood pressure readings of 96/48 - Experiences dizziness, attributed to medication regimen  Medication use and side effects - Amiodarone  200 mg daily, carvedilol  12.5 mg daily, digoxin  125 mcg daily, furosemide  40 mg BID, Jardiance  10 mg daily, Entresto  24/26 mg BID, rivaroxaban  20 mg daily - Uses gabapentin  primarily at night to aid sleep due to drowsiness; avoids daytime use to prevent daytime drowsiness - Skips furosemide  on days when travel is required to avoid frequent urination     ROS  Past Surgical History:  Procedure Laterality Date   CARDIAC DEFIBRILLATOR PLACEMENT  2024   CARDIOVERSION N/A 04/27/2013   Procedure: BEDSIDE CARDIOVERSION;  Surgeon: Jerel Balding, MD;  Location: [facility] OR;  Service: Cardiovascular;  Laterality: N/A;   CARDIOVERSION N/A 05/15/2013   Procedure: CARDIOVERSION/BEDSIDE;  Surgeon: Ezra GORMAN Shuck, MD;  Location: [facility] OR;  Service: Cardiovascular;  Laterality: N/A;   CARDIOVERSION N/A 05/16/2014   Procedure: CARDIOVERSION;  Surgeon: Oneil JAYSON Parchment, MD;  Location: [facility] ENDOSCOPY;  Service: Cardiovascular;  Laterality: N/A;   CARDIOVERSION N/A 06/07/2014   Procedure: CARDIOVERSION;  Surgeon: Oneil JAYSON Parchment, MD;  Location: [facility] ENDOSCOPY;  Service: Cardiovascular;  Laterality: N/A;   COLONOSCOPY WITH PROPOFOL  N/A 10/27/2017   Procedure: COLONOSCOPY WITH PROPOFOL ;  Surgeon: Shila Gustav GAILS, MD;  Location: [facility] ENDOSCOPY;  Service: Endoscopy;  Laterality: N/A;   CORONARY ANGIOPLASTY WITH STENT PLACEMENT  01/26/2008   BMS 2.54mm 23 to LAD. CTO of RCA unsuccessful, good left to right collaterals   ESOPHAGOGASTRODUODENOSCOPY (EGD) WITH PROPOFOL  N/A 10/24/2017   Procedure: ESOPHAGOGASTRODUODENOSCOPY (EGD) WITH PROPOFOL ;  Surgeon: Shila Gustav GAILS, MD;  Location: [facility]  ENDOSCOPY;  Service: Endoscopy;  Laterality: N/A;   FOOT SURGERY Right    ICD IMPLANT N/A 01/06/2021   Procedure: ICD IMPLANT;  Surgeon: Inocencio Soyla Lunger, MD;  Location: [facility] INVASIVE CV LAB;  Service: Cardiovascular;  Laterality: N/A;   LEFT HEART CATH AND CORONARY ANGIOGRAPHY N/A 01/05/2021   Procedure: LEFT HEART CATH AND CORONARY ANGIOGRAPHY;  Surgeon: Court Dorn PARAS, MD;  Location: [facility] INVASIVE CV LAB;  Service: Cardiovascular;  Laterality: N/A;   POLYPECTOMY  10/27/2017   Procedure: POLYPECTOMY;  Surgeon: Shila Gustav GAILS, MD;  Location: [facility] ENDOSCOPY;  Service: Endoscopy;;    Outpatient Medications Prior to Visit  Medication Sig Dispense Refill   acetaminophen  (GNP PAIN RELIEF EX-STRENGTH) 500 MG tablet TAKE 1-2 TABLETS BY MOUTH THREE (THREE) TIMES DAILY AS NEEDED. 180 tablet 0   allopurinol  (ZYLOPRIM ) 100 MG tablet TAKE TWO TABLETS (200 MG TOTAL) BY MOUTH DAILY 180 tablet 0   amiodarone  (PACERONE ) 200 MG tablet Take 1 tablet (200 mg total) by mouth daily. 400 MG TWICE A DAY 2 WEEKS 200 MG TWICE A DAY 2 WEEKS THEN DAILY     atorvastatin  (LIPITOR ) 40 MG tablet TAKE ONE TABLET BY MOUTH DAILY 90 tablet 2   Coenzyme Q10 (CO Q-10) 200 MG CAPS Take 200 mg by mouth daily.     digoxin  (LANOXIN ) 0.125 MG tablet TAKE ONE TABLET BY MOUTH DAILY 90 tablet 2   fexofenadine (ALLEGRA) 180 MG tablet Take 180 mg by mouth daily.     furosemide  (LASIX ) 40 MG tablet Take 1 tablet (40 mg total) by mouth 2 (two) times daily. 30 tablet 2   gabapentin  (NEURONTIN ) 300 MG capsule Take 300 mg by mouth at bedtime as needed (sleep nerve  pain).     Multiple Vitamin (MULTIVITAMIN) tablet Take 1 tablet by mouth daily.     nitroGLYCERIN  (NITROSTAT ) 0.4 MG SL tablet Place 1 tablet (0.4 mg total) under the tongue every 5 (five) minutes as needed for chest pain. 25 tablet 4   Omega-3 Fatty Acids (OMEGA 3 PO) Take 1 capsule by mouth daily.     rivaroxaban  (XARELTO ) 20 MG TABS tablet TAKE ONE TABLET BY MOUTH DAILY  WITH SUPPER 90 tablet 1   sacubitril -valsartan  (ENTRESTO ) 24-26 MG Take 1 tablet by mouth 2 (two) times daily. 180 tablet 0   vitamin C  (ASCORBIC ACID ) 500 MG tablet Take 500 mg by mouth daily.     VITAMIN D, CHOLECALCIFEROL , PO Take 5,000 Units by mouth daily.     carvedilol  (COREG ) 12.5 MG tablet Take 1 tablet (12.5 mg total) by mouth 2 (two) times daily. 180 tablet 3   Colchicine  0.6 MG CAPS Day 1: Take 1 caps. If still having swelling, may take 1 cap the following day. (Patient not taking: Reported on 10/12/2023) 30 capsule 0   JARDIANCE  10 MG TABS tablet Take 10 mg by mouth daily. (Patient not taking: Reported on 10/12/2023)     No facility-administered medications prior to visit.    Family History  Problem Relation Age of Onset   Other Father        Car accident   Coronary artery disease Neg Hx     Social History   Socioeconomic History   Marital status: Single    Spouse name: Not on file   Number of children: 0   Years of education: Not on file   Highest education level: High school graduate  Occupational History   Occupation: Retired/ disabled  Tobacco Use   Smoking status: Former    Types: Gaffer exposure: Never   Smokeless tobacco: Never   Tobacco comments:    1 cigar per month states he stopped smoking  Vaping Use   Vaping status: Never Used  Substance and Sexual Activity   Alcohol use: Not Currently   Drug use: No    Comment: 1 per month...pt states he stopped smoking   Sexual activity: Not Currently  Other Topics Concern   Not on file  Social History Narrative   Not on file   Social Drivers of Health   Financial Resource Strain: Low Risk  (07/27/2023)   Overall Financial Resource Strain (CARDIA)    Difficulty of Paying Living Expenses: Not very hard  Food Insecurity: No Food Insecurity (08/01/2023)   Hunger Vital Sign    Worried About Running Out of Food in the Last Year: Never true    Ran Out of Food in the Last Year: Never true   Transportation Needs: No Transportation Needs (08/01/2023)   PRAPARE - Administrator, Civil Service (Medical): No    Lack of Transportation (Non-Medical): No  Recent Concern: Transportation Needs - Unmet Transportation Needs (07/26/2023)   PRAPARE - Transportation    Lack of Transportation (Medical): Yes    Lack of Transportation (Non-Medical): Yes  Physical Activity: Inactive (02/07/2023)   Exercise Vital Sign    Days of Exercise per Week: 0 days    Minutes of Exercise per Session: 0 min  Stress: No Stress Concern Present (02/07/2023)   [employer] of Occupational Health - Occupational Stress Questionnaire    Feeling of Stress : Not at all  Social Connections: Socially Isolated (07/26/2023)   Social Connection and Isolation Panel  Frequency of Communication with Friends and Family: More than three times a week    Frequency of Social Gatherings with Friends and Family: Once a week    Attends Religious Services: Never    Database administrator or Organizations: No    Attends Banker Meetings: Never    Marital Status: Never married  Intimate Partner Violence: Not At Risk (07/26/2023)   Humiliation, Afraid, Rape, and Kick questionnaire    Fear of Current or Ex-Partner: No    Emotionally Abused: No    Physically Abused: No    Sexually Abused: No                                                                                               8   Objective:  Physical Exam: BP (!) 96/48 (BP Location: Right Arm, Patient Position: Sitting, Cuff Size: Large) Comment: recheck  Pulse (!) 50   Temp 97.7 F (36.5 C) (Temporal)   Resp 18   Wt 244 lb 6.4 oz (110.9 kg)   SpO2 98%   BMI 38.28 kg/m    BP Readings from Last 3 Encounters:  10/12/23 (!) 96/48  08/19/23 90/60  08/04/23 122/62    Physical Exam VITALS: P- 50, BP- 96/48 GENERAL: Alert, cooperative, well developed, no acute distress. HEENT: Normocephalic, normal oropharynx, moist mucous  membranes. CHEST: Clear to auscultation bilaterally, no wheezes, rhonchi, or crackles. CARDIOVASCULAR: Normal heart rate and rhythm, S1 and S2 normal without murmurs. ABDOMEN: Soft, non-tender, non-distended, without organomegaly, normal bowel sounds. EXTREMITIES: No edema, blueness noted in right great toe, blood flow to toes appears adequate. NEUROLOGICAL: Cranial nerves grossly intact, moves all extremities without gross motor or sensory deficit.   Physical Exam  CUP PACEART REMOTE DEVICE CHECK Result Date: 10/04/2023 ICD Scheduled remote reviewed. Normal device function.  Presenting rhythm: VS. 2 VHR episodes since 08/04/23 reprogramming session, longest 8 sec on 09/28/23 at 07:45 am, avg V rates 155-213 bpm, no EGMs available for review. Next remote 91 days. [facility], [facility]  ECHOCARDIOGRAM COMPLETE Result Date: 07/26/2023    ECHOCARDIOGRAM REPORT   Patient Name:   GROVER ROBINSON Date of Exam: 07/26/2023 Medical Rec #:  [id number]         Height:       67.0 in Accession #:    [id number]        Weight:       260.0 lb Date of Birth:  09/06/1955         BSA:          2.261 m Patient Age:    68 years          BP:           112/76 mmHg Patient Gender: M                 HR:           54 bpm. Exam Location:  Inpatient Procedure: 2D Echo, Color Doppler, Cardiac Doppler and Intracardiac            Opacification Agent (Both Spectral and Color Flow Doppler were  utilized during procedure). Indications:    I50.21 Acute systolic (congestive) heart failure  History:        Patient has prior history of Echocardiogram examinations, most                 recent 01/06/2021. CAD, Defibrillator, COPD, Arrythmias:Atrial                 Fibrillation; Risk Factors:Hypertension and Dyslipidemia.  Sonographer:    Damien Senior RDCS Referring Phys: [id number] MOHAMMAD L GARBA IMPRESSIONS  1. No evidence of left ventricular thrombus (Definity  was used). Left ventricular ejection fraction, by estimation, is 30 to 35%. Left ventricular  ejection fraction by 2D MOD biplane is 33.7 %. The left ventricle has moderately decreased function. The left ventricle demonstrates regional wall motion abnormalities (see scoring diagram/findings for description). The left ventricular internal cavity size was moderately dilated.  2. Right ventricular systolic function is low normal. The right ventricular size is normal. There is mildly elevated pulmonary artery systolic pressure. The estimated right ventricular systolic pressure is 35.8 mmHg.  3. Left atrial size was severely dilated.  4. Right atrial size was severely dilated.  5. The mitral valve is normal in structure. Moderate mitral valve regurgitation.  6. The aortic valve is tricuspid. There is mild calcification of the aortic valve. There is mild thickening of the aortic valve. Aortic valve regurgitation is not visualized. Aortic valve sclerosis is present, with no evidence of aortic valve stenosis.  7. The inferior vena cava is dilated in size with <50% respiratory variability, suggesting right atrial pressure of 15 mmHg. Comparison(s): Prior images reviewed side by side. FINDINGS  Left Ventricle: No evidence of left ventricular thrombus (Definity  was used). Left ventricular ejection fraction, by estimation, is 30 to 35%. Left ventricular ejection fraction by 2D MOD biplane is 33.7 %. The left ventricle has moderately decreased function. The left ventricle demonstrates regional wall motion abnormalities. Definity  contrast agent was given IV to delineate the left ventricular endocardial borders. The left ventricular internal cavity size was moderately dilated. There is no left ventricular hypertrophy. Abnormal (paradoxical) septal motion, consistent with left bundle branch block. Left ventricular diastolic function could not be evaluated due to atrial fibrillation.  LV Wall Scoring: The inferior wall, posterior wall, mid inferoseptal segment, and basal inferoseptal segment are akinetic. The apical inferior  segment is hypokinetic. The entire anterior wall, antero-lateral wall, entire anterior septum, apical lateral segment, and apex are normal. Right Ventricle: The right ventricular size is normal. No increase in right ventricular wall thickness. Right ventricular systolic function is low normal. There is mildly elevated pulmonary artery systolic pressure. The tricuspid regurgitant velocity is 2.28 m/s, and with an assumed right atrial pressure of 15 mmHg, the estimated right ventricular systolic pressure is 35.8 mmHg. Left Atrium: Left atrial size was severely dilated. Right Atrium: Right atrial size was severely dilated. Pericardium: Trivial pericardial effusion is present. Mitral Valve: The mitral valve is normal in structure. Moderate mitral valve regurgitation, with eccentric laterally directed jet. Tricuspid Valve: The tricuspid valve is normal in structure. Tricuspid valve regurgitation is trivial. Aortic Valve: The aortic valve is tricuspid. There is mild calcification of the aortic valve. There is mild thickening of the aortic valve. Aortic valve regurgitation is not visualized. Aortic valve sclerosis is present, with no evidence of aortic valve stenosis. Pulmonic Valve: The pulmonic valve was normal in structure. Pulmonic valve regurgitation is not visualized. Aorta: The aortic root and ascending aorta are structurally normal, with no evidence of  dilitation. Venous: The inferior vena cava is dilated in size with less than 50% respiratory variability, suggesting right atrial pressure of 15 mmHg. IAS/Shunts: No atrial level shunt detected by color flow Doppler. Additional Comments: A device lead is visualized in the right ventricle.  LEFT VENTRICLE PLAX 2D                        Biplane EF (MOD) LVIDd:         6.00 cm         LV Biplane EF:   Left LVIDs:         5.80 cm                          ventricular LV PW:         0.60 cm                          ejection LV IVS:        0.70 cm                           fraction by LVOT diam:     2.30 cm                          2D MOD LVOT Area:     4.15 cm                         biplane is                                                 33.7 %.  LV Volumes (MOD) LV vol d, MOD    233.0 ml A2C: LV vol d, MOD    224.0 ml A4C: LV vol s, MOD    152.0 ml A2C: LV vol s, MOD    154.0 ml A4C: LV SV MOD A2C:   81.0 ml LV SV MOD A4C:   224.0 ml LV SV MOD BP:    76.8 ml RIGHT VENTRICLE RV S prime:     13.80 cm/s TAPSE (M-mode): 1.6 cm LEFT ATRIUM              Index        RIGHT ATRIUM           Index LA diam:        4.90 cm  2.17 cm/m   RA Area:     24.10 cm LA Vol (A2C):   98.9 ml  43.75 ml/m  RA Volume:   81.10 ml  35.87 ml/m LA Vol (A4C):   100.0 ml 44.23 ml/m LA Biplane Vol: 106.0 ml 46.89 ml/m   AORTA Ao Root diam: 2.90 cm Ao Asc diam:  3.10 cm TRICUSPID VALVE TR Peak grad:   20.8 mmHg TR Vmax:        228.00 cm/s  SHUNTS Systemic Diam: 2.30 cm Jerel Croitoru MD Electronically signed by Jerel Balding MD Signature Date/Time: 07/26/2023/4:31:19 PM    Final    DG Chest Portable 1 View Result Date: 07/25/2023 CLINICAL DATA:  Shortness of breath. EXAM: PORTABLE CHEST 1 VIEW COMPARISON:  01/07/2021 FINDINGS: Left-sided pacemaker in place.  Mild cardiomegaly is similar. Diffuse vascular congestion. Ill-defined opacity at the left lung base. No pneumothorax. No large pleural effusion, suboptimally assessed due to soft tissue attenuation. IMPRESSION: 1. Mild cardiomegaly with vascular congestion. 2. Ill-defined opacity at the left lung base may represent atelectasis/airspace disease or soft tissue attenuation from habitus. Electronically Signed   By: Andrea Gasman M.D.   On: 07/25/2023 17:41    Recent Results (from the past 2160 hours)  Comprehensive metabolic panel     Status: Abnormal   Collection Time: 07/25/23  5:51 PM  Result Value Ref Range   Sodium 139 135 - 145 mmol/L   Potassium 3.8 3.5 - 5.1 mmol/L   Chloride 100 98 - 111 mmol/L   CO2 26 22 - 32 mmol/L   Glucose,  Bld 128 (H) 70 - 99 mg/dL    Comment: Glucose reference range applies only to samples taken after fasting for at least 8 hours.   BUN 11 8 - 23 mg/dL   Creatinine, Ser 8.93 0.61 - 1.24 mg/dL   Calcium  8.8 (L) 8.9 - 10.3 mg/dL   Total Protein 6.6 6.5 - 8.1 g/dL   Albumin 3.3 (L) 3.5 - 5.0 g/dL   AST 27 15 - 41 U/L   ALT 26 0 - 44 U/L   Alkaline Phosphatase 57 38 - 126 U/L   Total Bilirubin 3.1 (H) 0.0 - 1.2 mg/dL   GFR, Estimated >39 >39 mL/min    Comment: (NOTE) Calculated using the CKD-EPI Creatinine Equation (2021)    Anion gap 13 5 - 15    Comment: Performed at [facility], [address] [place], [place] [address]  CBC     Status: None   Collection Time: 07/25/23  5:51 PM  Result Value Ref Range   WBC 5.2 4.0 - 10.5 K/uL   RBC 4.32 4.22 - 5.81 MIL/uL   Hemoglobin 13.7 13.0 - 17.0 g/dL   HCT 60.0 60.9 - 47.9 %   MCV 92.4 80.0 - 100.0 fL   MCH 31.7 26.0 - 34.0 pg   MCHC 34.3 30.0 - 36.0 g/dL   RDW 84.4 88.4 - 84.4 %   Platelets 181 150 - 400 K/uL   nRBC 0.0 0.0 - 0.2 %    Comment: Performed at [facility], [address], [place], [place] [address]  Magnesium      Status: None   Collection Time: 07/25/23  5:51 PM  Result Value Ref Range   Magnesium  1.9 1.7 - 2.4 mg/dL    Comment: Performed at [facility], [address] [place], [place] [address]  Digoxin  level     Status: None   Collection Time: 07/25/23  6:00 PM  Result Value Ref Range   Digoxin  Level 0.8 0.8 - 2.0 ng/mL    Comment: Performed at [facility], [address] [place], [place] [address]  HIV Antibody (routine testing w rflx)     Status: None   Collection Time: 07/26/23  2:52 AM  Result Value Ref Range   HIV Screen 4th Generation wRfx Non Reactive Non Reactive    Comment: Performed at [facility], [address] [place], [place] [address]  Basic metabolic panel     Status: Abnormal   Collection Time: 07/26/23  2:52 AM  Result Value Ref Range   Sodium 139 135 - 145  mmol/L   Potassium 3.3 (L) 3.5 - 5.1 mmol/L   Chloride 100 98 - 111 mmol/L  CO2 30 22 - 32 mmol/L   Glucose, Bld 106 (H) 70 - 99 mg/dL    Comment: Glucose reference range applies only to samples taken after fasting for at least 8 hours.   BUN 12 8 - 23 mg/dL   Creatinine, Ser 8.99 0.61 - 1.24 mg/dL   Calcium  8.4 (L) 8.9 - 10.3 mg/dL   GFR, Estimated >39 >39 mL/min    Comment: (NOTE) Calculated using the CKD-EPI Creatinine Equation (2021)    Anion gap 9 5 - 15    Comment: Performed at [facility], [address], [place], [place] [address]  ECHOCARDIOGRAM COMPLETE     Status: None   Collection Time: 07/26/23  1:50 PM  Result Value Ref Range   Weight 4,160 oz   Height 67 in   BP 112/76 mmHg   Single Plane A2C EF 34.8 %   Single Plane A4C EF 31.3 %   Calc EF 33.7 %   S' Lateral 5.80 cm   Est EF 30 - 35%   MRSA Next Gen by PCR, Nasal     Status: None   Collection Time: 07/26/23  3:17 PM   Specimen: Nasal Mucosa; Nasal Swab  Result Value Ref Range   MRSA by PCR Next Gen NOT DETECTED NOT DETECTED    Comment: (NOTE) The GeneXpert MRSA Assay (FDA approved for NASAL specimens only), is one component of a comprehensive MRSA colonization surveillance program. It is not intended to diagnose MRSA infection nor to guide or monitor treatment for MRSA infections. Test performance is not FDA approved in patients less than 51 years old. Performed at [facility], [address], [place], [place] [address]   Basic metabolic panel     Status: Abnormal   Collection Time: 07/27/23  4:24 AM  Result Value Ref Range   Sodium 138 135 - 145 mmol/L   Potassium 3.9 3.5 - 5.1 mmol/L   Chloride 98 98 - 111 mmol/L   CO2 25 22 - 32 mmol/L   Glucose, Bld 115 (H) 70 - 99 mg/dL    Comment: Glucose reference range applies only to samples taken after fasting for at least 8 hours.   BUN 16 8 - 23 mg/dL   Creatinine, Ser 8.88 0.61 - 1.24 mg/dL   Calcium  9.0 8.9 - 10.3 mg/dL   GFR, Estimated  >39 >39 mL/min    Comment: (NOTE) Calculated using the CKD-EPI Creatinine Equation (2021)    Anion gap 15 5 - 15    Comment: Performed at [facility], [address] [place], [place] [address]  Magnesium      Status: None   Collection Time: 07/27/23  4:24 AM  Result Value Ref Range   Magnesium  2.1 1.7 - 2.4 mg/dL    Comment: Performed at [facility], [address] [place], [place] [address]  Basic metabolic panel     Status: Abnormal   Collection Time: 07/28/23  1:26 AM  Result Value Ref Range   Sodium 138 135 - 145 mmol/L   Potassium 3.8 3.5 - 5.1 mmol/L   Chloride 97 (L) 98 - 111 mmol/L   CO2 28 22 - 32 mmol/L   Glucose, Bld 146 (H) 70 - 99 mg/dL    Comment: Glucose reference range applies only to samples taken after fasting for at least 8 hours.   BUN 22 8 - 23 mg/dL   Creatinine, Ser 8.78 0.61 - 1.24 mg/dL   Calcium  9.4 8.9 -  10.3 mg/dL   GFR, Estimated >39 >39 mL/min    Comment: (NOTE) Calculated using the CKD-EPI Creatinine Equation (2021)    Anion gap 13 5 - 15    Comment: Performed at [facility], [address], [place], [place] [address]  Basic metabolic panel     Status: Abnormal   Collection Time: 07/29/23  2:56 AM  Result Value Ref Range   Sodium 135 135 - 145 mmol/L   Potassium 4.1 3.5 - 5.1 mmol/L   Chloride 98 98 - 111 mmol/L   CO2 23 22 - 32 mmol/L   Glucose, Bld 107 (H) 70 - 99 mg/dL    Comment: Glucose reference range applies only to samples taken after fasting for at least 8 hours.   BUN 28 (H) 8 - 23 mg/dL   Creatinine, Ser 8.95 0.61 - 1.24 mg/dL   Calcium  9.3 8.9 - 10.3 mg/dL   GFR, Estimated >39 >39 mL/min    Comment: (NOTE) Calculated using the CKD-EPI Creatinine Equation (2021)    Anion gap 14 5 - 15    Comment: Performed at [facility], [address] [place], [place] [address]  Basic metabolic panel with GFR     Status: Abnormal   Collection Time: 07/30/23  5:29 AM  Result Value Ref Range   Sodium 136 135 -  145 mmol/L   Potassium 3.9 3.5 - 5.1 mmol/L   Chloride 99 98 - 111 mmol/L   CO2 26 22 - 32 mmol/L   Glucose, Bld 138 (H) 70 - 99 mg/dL    Comment: Glucose reference range applies only to samples taken after fasting for at least 8 hours.   BUN 31 (H) 8 - 23 mg/dL   Creatinine, Ser 8.77 0.61 - 1.24 mg/dL   Calcium  9.3 8.9 - 10.3 mg/dL   GFR, Estimated >39 >39 mL/min    Comment: (NOTE) Calculated using the CKD-EPI Creatinine Equation (2021)    Anion gap 11 5 - 15    Comment: Performed at [facility], [address] [place], [place] [address]  Digoxin  level     Status: None   Collection Time: 08/04/23  3:33 PM  Result Value Ref Range   Digoxin  Level 1.9 0.8 - 2.0 ng/mL    Comment: Performed at [facility], [address], [place], [place] [address]  Basic Metabolic Panel (BMET)     Status: Abnormal   Collection Time: 08/04/23  3:33 PM  Result Value Ref Range   Sodium 135 135 - 145 mmol/L   Potassium 5.4 (H) 3.5 - 5.1 mmol/L   Chloride 99 98 - 111 mmol/L   CO2 25 22 - 32 mmol/L   Glucose, Bld 140 (H) 70 - 99 mg/dL    Comment: Glucose reference range applies only to samples taken after fasting for at least 8 hours.   BUN 36 (H) 8 - 23 mg/dL   Creatinine, Ser 8.46 (H) 0.61 - 1.24 mg/dL   Calcium  10.0 8.9 - 10.3 mg/dL   GFR, Estimated 49 (L) >60 mL/min    Comment: (NOTE) Calculated using the CKD-EPI Creatinine Equation (2021)    Anion gap 11 5 - 15    Comment: Performed at [facility], [address], [place], [place] [address]  Digoxin  level     Status: None   Collection Time: 08/11/23  3:35 PM  Result Value Ref Range   Digoxin  Level 1.4 0.8 - 2.0 ng/mL  Comment: Performed at [facility], [address], [place], [place] [address]  Basic metabolic panel with GFR     Status: Abnormal   Collection Time: 08/11/23  3:35 PM  Result Value Ref Range   Sodium 134 (L) 135 - 145 mmol/L   Potassium 4.8 3.5 - 5.1 mmol/L   Chloride 96 (L) 98 - 111 mmol/L    CO2 24 22 - 32 mmol/L   Glucose, Bld 118 (H) 70 - 99 mg/dL    Comment: Glucose reference range applies only to samples taken after fasting for at least 8 hours.   BUN 30 (H) 8 - 23 mg/dL   Creatinine, Ser 8.00 (H) 0.61 - 1.24 mg/dL   Calcium  9.8 8.9 - 10.3 mg/dL   GFR, Estimated 35 (L) >60 mL/min    Comment: (NOTE) Calculated using the CKD-EPI Creatinine Equation (2021)    Anion gap 14 5 - 15    Comment: Performed at [facility], [address], [place], [place] [address]  Basic Metabolic Panel (BMET)     Status: Abnormal   Collection Time: 08/19/23  4:21 PM  Result Value Ref Range   Glucose 120 (H) 70 - 99 mg/dL   BUN 26 8 - 27 mg/dL   Creatinine, Ser 8.74 0.76 - 1.27 mg/dL   eGFR 62 >40 fO/fpw/8.26   BUN/Creatinine Ratio 21 10 - 24   Sodium 138 134 - 144 mmol/L   Potassium 4.4 3.5 - 5.2 mmol/L   Chloride 98 96 - 106 mmol/L   CO2 23 20 - 29 mmol/L   Calcium  9.7 8.6 - 10.2 mg/dL  CUP PACEART REMOTE DEVICE CHECK     Status: None   Collection Time: 10/03/23  2:02 AM  Result Value Ref Range   Date Time Interrogation Session [id number]    Pulse Generator Manufacturer [facility]    Pulse Gen Model [id number] [place] VR    Pulse Gen Serial Number [id number]    Clinic Name [facility]    Implantable Pulse Generator Type Implantable Cardiac Defibulator    Implantable Pulse Generator Implant Date 79778786    Implantable Lead Manufacturer [facility]    Implantable Lead Model [id number] [id number] Durata SJ4    Implantable Lead Serial Number [id number]    Implantable Lead Implant Date 79778786    Implantable Lead Location Detail 1 [employer]    Implantable Lead Location [id number]    Implantable Lead Connection Status [id number]    Lead Channel Setting Sensing Sensitivity 0.5 mV   Lead Channel Setting Sensing Adaptation Mode Adaptive Sensing    Lead Channel Setting Pacing Pulse Width 0.5 ms   Lead Channel Setting Pacing Amplitude 2.5 V   Zone Setting Status Active    Zone Setting Status Active    Zone Setting  Status Active    Lead Channel Status NULL    Lead Channel Impedance Value 360 ohm   Lead Channel Sensing Intrinsic Amplitude 7.6 mV   Lead Channel Pacing Threshold Amplitude 1.0 V   Lead Channel Pacing Threshold Pulse Width 0.5 ms   HighPow Impedance 63 ohm   HighPow Imped Status NULL    Battery Status MOS    Battery Remaining Longevity 94 mo   Battery Remaining Percentage 77.0 %   Battery Voltage 2.99 V   Brady Statistic RV Percent Paced 15.0 %        Beverley Adine Hummer, MD, MS

## 2023-10-12 NOTE — Telephone Encounter (Signed)
 Pt c/o medication issue:  1. Name of Medication:  empagliflozin  (JARDIANCE ) 10 MG   spironolactone  (ALDACTONE ) 25 MG tablet   2. How are you currently taking this medication (dosage and times per day)?   Not taking  3. Are you having a reaction (difficulty breathing--STAT)?   4. What is your medication issue?   Patient called to report that he is will not ever be taking these medications as his pharmacy still delivers these.

## 2023-10-12 NOTE — Telephone Encounter (Signed)
 Left message requesting patient give tell us  which pharmacy is refilling his d/c'd drugs. I see [facility] pharmacy listed currently

## 2023-10-13 NOTE — Progress Notes (Signed)
Remote ICD Transmission.

## 2023-10-13 NOTE — Telephone Encounter (Signed)
 Forwarding patient message below.

## 2023-10-13 NOTE — Telephone Encounter (Signed)
 Copied from CRM [id number]. Topic: Referral - Question >> Oct 12, 2023  5:29 PM [place] J wrote: Reason for CRM: Patient is wondering why Dr. Sebastian made a referral to [facility] and not to the podiatrist in the same building as primary.  Please call patient for clarification at: [phone]

## 2023-10-18 ENCOUNTER — Ambulatory Visit: Attending: Internal Medicine | Admitting: Internal Medicine

## 2023-10-18 ENCOUNTER — Encounter: Payer: Self-pay | Admitting: Internal Medicine

## 2023-10-18 VITALS — BP 96/55 | HR 62 | Temp 98.2°F | Resp 18 | Ht 67.0 in | Wt 245.6 lb

## 2023-10-18 DIAGNOSIS — I5022 Chronic systolic (congestive) heart failure: Secondary | ICD-10-CM | POA: Diagnosis not present

## 2023-10-18 DIAGNOSIS — M79674 Pain in right toe(s): Secondary | ICD-10-CM | POA: Diagnosis not present

## 2023-10-18 DIAGNOSIS — M1A0420 Idiopathic chronic gout, left hand, without tophus (tophi): Secondary | ICD-10-CM

## 2023-10-19 ENCOUNTER — Ambulatory Visit: Payer: Self-pay | Admitting: Family Medicine

## 2023-10-19 LAB — URIC ACID: Uric Acid, Serum: 5.7 mg/dL (ref 4.0–8.0)

## 2023-10-19 LAB — SEDIMENTATION RATE: Sed Rate: 2 mm/h (ref 0–20)

## 2023-10-19 NOTE — Telephone Encounter (Signed)
 Pt reports [employer] and discontinued medications from their profiles.  Was reassured they will not be delivered to pt any longer.

## 2023-10-25 ENCOUNTER — Other Ambulatory Visit: Payer: Self-pay | Admitting: Cardiology

## 2023-10-25 DIAGNOSIS — I5022 Chronic systolic (congestive) heart failure: Secondary | ICD-10-CM

## 2023-10-27 ENCOUNTER — Other Ambulatory Visit (HOSPITAL_COMMUNITY): Payer: Self-pay

## 2023-10-27 ENCOUNTER — Telehealth: Payer: Self-pay | Admitting: Pharmacy Technician

## 2023-10-27 NOTE — Telephone Encounter (Signed)
    Ins will pay for brand notified pharmacy

## 2023-10-28 ENCOUNTER — Telehealth: Payer: Self-pay | Admitting: Cardiology

## 2023-10-28 DIAGNOSIS — I5022 Chronic systolic (congestive) heart failure: Secondary | ICD-10-CM

## 2023-10-28 MED ORDER — FUROSEMIDE 40 MG PO TABS
40.0000 mg | ORAL_TABLET | Freq: Two times a day (BID) | ORAL | 2 refills | Status: AC
Start: 2023-10-28 — End: ?

## 2023-10-28 NOTE — Telephone Encounter (Signed)
*  STAT* If patient is at the pharmacy, call can be transferred to refill team.   1. Which medications need to be refilled? (please list name of each medication and dose if known)   furosemide (LASIX) 40 MG tablet    2. Which pharmacy/location (including street and city if local pharmacy) is medication to be sent to? [facility] - [place], [place] - [address]    3. Do they need a 30 day or 90 day supply? 90 day

## 2023-10-28 NOTE — Telephone Encounter (Signed)
 RX sent in

## 2023-11-04 ENCOUNTER — Telehealth: Payer: Self-pay

## 2023-11-04 NOTE — Telephone Encounter (Signed)
 Patient called to request a prescription for medication for toe fungus. I advised patient to contact his PCP, podiatry or Urgent Care.

## 2023-11-07 ENCOUNTER — Ambulatory Visit (HOSPITAL_COMMUNITY)
Admission: RE | Admit: 2023-11-07 | Discharge: 2023-11-07 | Disposition: A | Discharge status: Home / Self Care | Source: Ambulatory Visit | Attending: Family Medicine | Admitting: Family Medicine

## 2023-11-07 DIAGNOSIS — I739 Peripheral vascular disease, unspecified: Secondary | ICD-10-CM | POA: Insufficient documentation

## 2023-11-07 LAB — VAS US ABI WITH/WO TBI
Left ABI: 1.16
Right ABI: 1.05

## 2023-11-09 ENCOUNTER — Ambulatory Visit: Admitting: Podiatry

## 2023-11-10 ENCOUNTER — Ambulatory Visit

## 2023-11-10 DIAGNOSIS — M79671 Pain in right foot: Secondary | ICD-10-CM | POA: Diagnosis not present

## 2023-11-10 DIAGNOSIS — Z8739 Personal history of other diseases of the musculoskeletal system and connective tissue: Secondary | ICD-10-CM | POA: Diagnosis not present

## 2023-11-10 DIAGNOSIS — I8393 Asymptomatic varicose veins of bilateral lower extremities: Secondary | ICD-10-CM

## 2023-11-10 DIAGNOSIS — G629 Polyneuropathy, unspecified: Secondary | ICD-10-CM | POA: Diagnosis not present

## 2023-11-10 DIAGNOSIS — M79674 Pain in right toe(s): Secondary | ICD-10-CM

## 2023-11-10 DIAGNOSIS — M19071 Primary osteoarthritis, right ankle and foot: Secondary | ICD-10-CM

## 2023-11-10 DIAGNOSIS — M25471 Effusion, right ankle: Secondary | ICD-10-CM | POA: Diagnosis not present

## 2023-11-10 MED ORDER — GABAPENTIN 300 MG PO CAPS: 300.0000 mg | ORAL_CAPSULE | Freq: Every evening | ORAL | 2 refills | Status: AC | PRN

## 2023-11-10 MED ORDER — CICLOPIROX 8 % EX SOLN: Freq: Every day | CUTANEOUS | 0 refills | Status: AC

## 2023-11-10 NOTE — Progress Notes (Signed)
 Subjective:  Patient ID: Norman Rodriguez, male    DOB: 1955/05/15,  MRN: [id number]  Chief Complaint  Patient presents with   Foot Swelling    Patient stated that he was referred by PCP to come see doctor about Edema in right foot. This has been going on for a while patient states. Pain comes and goes but it is not so bad patient stated     68 y.o. male presents with the above complaint.  He was referred to our office by his PCP, Dr. Sebastian.  He has complaints of edema, burning and tingling to his foot and ankle keeping him up at night, achy/sore pain to his ankle and a thickened right hallux toenail.  He does also relate to a history of gout, he is not currently experiencing any flare and does see a rheumatologist.  Review of Systems: Negative except as noted in the HPI. Denies N/V/F/Ch.  Past Medical History:  Diagnosis Date   ACS (acute coronary syndrome) (HCC)    Arthritis    Atrial fibrillation (HCC)    a. Dx 2010. b. Initiated on Tikosyn  03/2013.   BRBPR (bright red blood per rectum) 10/22/2017   CAD (coronary artery disease)    a. BMS 2.32mm 23 to LAD. CTO of RCA unsuccessful, good left to right collaterals - 2010.   Chronic anticoagulation    Chronic systolic CHF (congestive heart failure) (HCC)    a. Both ischemic and tachy induced. b. Echo 04/2013: EF 30-35%, no RWMA, normal RV, mod dilated LA (before restoration of NSR).   COPD (chronic obstructive pulmonary disease) (HCC) 10/22/2017   Dysrhythmia    hx of atrial fibrilation   HTN (hypertension)    Hyperlipidemia    Melena    NSTEMI (non-ST elevated myocardial infarction) (HCC) 01/05/2021   NSVT (nonsustained ventricular tachycardia) (HCC)    Obesity    Polyp of cecum    Polyp of descending colon    Polyp of rectum    Polyp of transverse colon    Shortness of breath     Current Outpatient Medications:    acetaminophen  (GNP PAIN RELIEF EX-STRENGTH) 500 MG tablet, TAKE 1-2 TABLETS BY MOUTH THREE (THREE) TIMES DAILY  AS NEEDED., Disp: 180 tablet, Rfl: 0   allopurinol  (ZYLOPRIM ) 100 MG tablet, TAKE TWO TABLETS (200 MG TOTAL) BY MOUTH DAILY, Disp: 180 tablet, Rfl: 0   amiodarone  (PACERONE ) 200 MG tablet, Take 1 tablet (200 mg total) by mouth daily. 400 MG TWICE A DAY 2 WEEKS 200 MG TWICE A DAY 2 WEEKS THEN DAILY, Disp: , Rfl:    atorvastatin  (LIPITOR ) 40 MG tablet, TAKE ONE TABLET BY MOUTH DAILY, Disp: 90 tablet, Rfl: 2   carvedilol  (COREG ) 6.25 MG tablet, Take 1 tablet (6.25 mg total) by mouth 2 (two) times daily., Disp: 180 tablet, Rfl: 0   ciclopirox (PENLAC) 8 % solution, Apply topically at bedtime. Apply over nail and surrounding skin. Apply daily over previous coat. After seven (7) days, may remove with alcohol and continue cycle., Disp: 6.6 mL, Rfl: 0   Coenzyme Q10 (CO Q-10) 200 MG CAPS, Take 200 mg by mouth daily., Disp: , Rfl:    Colchicine  0.6 MG CAPS, Day 1: Take 1 caps. If still having swelling, may take 1 cap the following day., Disp: 30 capsule, Rfl: 0   digoxin  (LANOXIN ) 0.125 MG tablet, TAKE ONE TABLET BY MOUTH DAILY, Disp: 90 tablet, Rfl: 2   fexofenadine (ALLEGRA) 180 MG tablet, Take 180 mg by mouth daily.,  Disp: , Rfl:    furosemide  (LASIX ) 40 MG tablet, Take 1 tablet (40 mg total) by mouth 2 (two) times daily., Disp: 180 tablet, Rfl: 2   Multiple Vitamin (MULTIVITAMIN) tablet, Take 1 tablet by mouth daily., Disp: , Rfl:    nitroGLYCERIN  (NITROSTAT ) 0.4 MG SL tablet, Place 1 tablet (0.4 mg total) under the tongue every 5 (five) minutes as needed for chest pain., Disp: 25 tablet, Rfl: 4   Omega-3 Fatty Acids (OMEGA 3 PO), Take 1 capsule by mouth daily., Disp: , Rfl:    rivaroxaban  (XARELTO ) 20 MG TABS tablet, TAKE ONE TABLET BY MOUTH DAILY WITH SUPPER, Disp: 90 tablet, Rfl: 1   sacubitril -valsartan  (ENTRESTO ) 24-26 MG, TAKE ONE TABLET BY MOUTH TWICE A DAY, Disp: 180 tablet, Rfl: 2   vitamin C  (ASCORBIC ACID ) 500 MG tablet, Take 500 mg by mouth daily., Disp: , Rfl:    VITAMIN D, CHOLECALCIFEROL ,  PO, Take 5,000 Units by mouth daily., Disp: , Rfl:    gabapentin  (NEURONTIN ) 300 MG capsule, Take 1 capsule (300 mg total) by mouth at bedtime as needed (sleep nerve pain)., Disp: 60 capsule, Rfl: 2  Social History   Tobacco Use  Smoking Status Former   Types: Cigars   Passive exposure: Past  Smokeless Tobacco Never  Tobacco Comments   1 cigar per month states he stopped smoking    Allergies  Allergen Reactions   Apple Anaphylaxis   Apple Juice Anaphylaxis   Soybean Oil Other (See Comments)    unknown   Flaxseed (Linseed)    Lisinopril Cough   Other Itching    Pt has itching and joint swelling with walnuts, mildew, fungi, basil and peaches   Bean Pod Extract Rash    Green Beans specifically Joint swelling and itching   Carrot [Daucus Carota] Rash    Joint swelling and itching   Grass Extracts [Gramineae Pollens] Rash    Joint swelling and itching   Peach Flavoring Agent (Non-Screening) Rash    Joint swelling and itching  Patient is not allergic to flavoring, only the fruit itself.    Tree Extract Rash    Joint swelling and itching   Objective:  There were no vitals filed for this visit. There is no height or weight on file to calculate BMI. Constitutional Well developed. Well nourished. Oriented to person, place, and time.  Vascular Dorsalis pedis pulses faintly palpable bilaterally. Reviewed previous ABI showing normal pressures.  Posterior tibial pulses faintly palpable bilaterally. Reviewed previous ABI showing normal pressures.  Capillary refill normal to all digits.  No cyanosis or clubbing noted. Pedal hair growth normal.  Neurologic Normal speech. Epicritic sensation to light touch grossly present bilaterally. Subjective description of burning/tingling.  Negative tinel sign at tarsal tunnel bilaterally.   Dermatologic Skin texture and turgor are within normal limits.  No open wounds. No skin lesions. Diffuse, nonpitting edema is present to the lower calf  extending to the ankle.  Varicose veins are present diffusely. Right hallux nail thickened, crumbly, dystrophic, discolored. No pain to palpation.   Musculoskeletal: 5 out of 5 muscle strength in all major pedal muscle groups.  Pain to palpation of the anterior gutter ankle joint.  Minimal pain with attempted ROM. Decreased ankle joint DF.    Radiographs: Taken and reviewed.  3 weightbearing views of the right foot were taken today.  These demonstrate a rectus foot shape without any acute osseous findings such as fracture or dislocation.  Rectus foot shape is present. Joint spaces otherwise  well maintained.   Assessment:   1. Edema of right ankle   2. Foot pain, right   3. Great toe pain, right   4. History of gout   5. Arthritis of right ankle   6. Asymptomatic varicose veins of both lower extremities   7. Peripheral polyneuropathy    Plan:  - Patient was evaluated and treated and all questions answered.  Right ankle edema and arthritis -We discussed the diagnosis of his right ankle edema.  He does have decreased heart function and is seeing a cardiologist for this.  He compression stockings have been discussed with him before, he has not tolerated them well.  I discussed recommendation for compression stockings, he does express understanding - Patient had ankle ORIF and extended period of time ago.  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  I discussed that this may be causing some of his soreness, achiness especially as the day goes on.  We discussed conservative treatments for his ankle arthritis.  He has had a custom brace before that he did not tolerate well.  I discussed that we can try this again in the future if he has interest.  Peripheral polyneuropathy -Patient does have intact protective sensation but describes subjective feelings of burning, tingling in his lower extremity.  He states that this keeps him up at night.  He was on gabapentin  300  mg once daily in the past and tolerated it very well.  Placed prescription for gabapentin  300 mg once daily, take before bed.  He does express understanding of this.  Nail dystrophy - Right hallux nail thickened, discolored in appearance.  Favor trauma over nail fungus.  Patient has in the past utilized over-the-counter nail fungal treatments and states that they did help to a degree.  He requests ciclopirox/Penlac, I did send a prescription for it.    History of gout - Discussed with the patient that while he has a history of gout, there are is not currently in acute flare.  His first metatarsophalangeal joint is nonpainful with range of motion with palpation. No indication for injection or anti-inflammatory at this time.   Peripheral arterial disease - Reviewed ABI performed 11/07/23 showing adequate blood flow for healing.   RTC PRN  Prentice Ovens, DPM AACFAS Fellowship Trained Podiatric Surgeon [facility]

## 2023-11-28 NOTE — Progress Notes (Signed)
   11/28/2023  Patient ID: Norman Rodriguez, male   DOB: 01-02-1956, 68 y.o.   MRN: [id number]  This patient is appearing on a report for being at risk of failing the adherence measure for diabetes medications this calendar year.   Medication: Jardiance  10mg  Last fill date: 9/10 for 30 day supply  Prescribed for CHF, and recently stopped due to adverse side effects.   Norman Rodriguez, PharmD, DPLA

## 2023-12-15 ENCOUNTER — Other Ambulatory Visit: Payer: Self-pay | Admitting: Physician Assistant

## 2023-12-16 ENCOUNTER — Ambulatory Visit: Payer: Self-pay | Admitting: Internal Medicine

## 2023-12-16 ENCOUNTER — Other Ambulatory Visit: Payer: Self-pay | Admitting: Internal Medicine

## 2023-12-16 DIAGNOSIS — M1A0420 Idiopathic chronic gout, left hand, without tophus (tophi): Secondary | ICD-10-CM

## 2023-12-16 NOTE — Telephone Encounter (Signed)
 Last Fill: 09/06/2023  Labs: 08/19/2023 Glucose 120, 10/18/2023 Uric Acid 5.7, 07/25/2023 CBC WNL  Next Visit: 04/16/2024  Last Visit: 10/18/2023  DX: Chronic gout of left hand, unspecified cause   Current Dose per office note 10/18/2023: allopurinol  200 mg daily   Okay to refill Allopurinol ?

## 2023-12-26 ENCOUNTER — Telehealth: Payer: Self-pay | Admitting: Family Medicine

## 2023-12-26 NOTE — Telephone Encounter (Signed)
 Copied from CRM #[id number]. Topic: General - Other >> Dec 26, 2023 11:10 AM Dedra B wrote: Reason for CRM: Patient wanted to let Dr. Sebastian know that Dr. Magdalen appreciated the referral.

## 2024-01-02 ENCOUNTER — Ambulatory Visit: Payer: Medicare Other

## 2024-01-03 LAB — CUP PACEART REMOTE DEVICE CHECK
Battery Remaining Longevity: 90 mo
Battery Remaining Percentage: 75 %
Battery Voltage: 2.99 V
Brady Statistic RV Percent Paced: 14 %
Date Time Interrogation Session: 20251208020112
HighPow Impedance: 65 Ohm
Implantable Lead Connection Status: 753985
Implantable Lead Implant Date: 20221213
Implantable Lead Location: 753860
Implantable Pulse Generator Implant Date: 20221213
Lead Channel Impedance Value: 340 Ohm
Lead Channel Pacing Threshold Amplitude: 1 V
Lead Channel Pacing Threshold Pulse Width: 0.5 ms
Lead Channel Sensing Intrinsic Amplitude: 9.5 mV
Lead Channel Setting Pacing Amplitude: 2.5 V
Lead Channel Setting Pacing Pulse Width: 0.5 ms
Lead Channel Setting Sensing Sensitivity: 0.5 mV
Pulse Gen Serial Number: 810054243

## 2024-01-04 ENCOUNTER — Ambulatory Visit: Payer: Self-pay | Admitting: Cardiology

## 2024-01-04 NOTE — Progress Notes (Signed)
 Remote ICD Transmission

## 2024-02-03 ENCOUNTER — Telehealth: Payer: Self-pay | Admitting: Cardiology

## 2024-02-03 DIAGNOSIS — G8929 Other chronic pain: Secondary | ICD-10-CM

## 2024-02-03 DIAGNOSIS — I5022 Chronic systolic (congestive) heart failure: Secondary | ICD-10-CM

## 2024-02-03 MED ORDER — CARVEDILOL 6.25 MG PO TABS: 6.2500 mg | ORAL_TABLET | Freq: Two times a day (BID) | ORAL | 1 refills | Status: AC

## 2024-02-03 MED ORDER — SACUBITRIL-VALSARTAN 24-26 MG PO TABS: 1.0000 | ORAL_TABLET | Freq: Two times a day (BID) | ORAL | 1 refills | Status: AC

## 2024-02-03 NOTE — Telephone Encounter (Signed)
 Pt's medications was sent to pt's pharmacy as requested. Confirmation received.

## 2024-02-03 NOTE — Telephone Encounter (Signed)
" °*  STAT* If patient is at the pharmacy, call can be transferred to refill team.   1. Which medications need to be refilled? (please list name of each medication and dose if known) carvedilol  (COREG ) 6.25 MG tablet (Expired)    sacubitril -valsartan  (ENTRESTO ) 24-26 MG    2. Which pharmacy/location (including street and city if local pharmacy) is medication to be sent to?  [facility] - [place], [place] - [address]    3. Do they need a 30 day or 90 day supply? 90  "

## 2024-02-07 ENCOUNTER — Telehealth: Payer: Self-pay

## 2024-02-07 NOTE — Telephone Encounter (Signed)
 This medication was prescribed and addressed by cardiology. See 02/03/24 TE.

## 2024-02-07 NOTE — Telephone Encounter (Signed)
 Copied from CRM #[id number]. Topic: Clinical - Prescription Issue >> Feb 03, 2024  3:01 PM Deleta RAMAN wrote: Reason for CRM: patient received 12.5 of carvedilol  (COREG ) 6.25 MG tablet instead of 6.5

## 2024-02-10 ENCOUNTER — Ambulatory Visit: Payer: 59

## 2024-02-14 ENCOUNTER — Ambulatory Visit

## 2024-02-14 DIAGNOSIS — Z0000 Encounter for general adult medical examination without abnormal findings: Secondary | ICD-10-CM

## 2024-02-14 NOTE — Patient Instructions (Signed)
 Norman Rodriguez,  Thank you for taking the time for your Medicare Wellness Visit. I appreciate your continued commitment to your health goals. Please review the care plan we discussed, and feel free to reach out if I can assist you further.  Please note that Annual Wellness Visits do not include a physical exam. Some assessments may be limited, especially if the visit was conducted virtually. If needed, we may recommend an in-person follow-up with your provider.  Ongoing Care Seeing your primary care provider every 3 to 6 months helps us  monitor your health and provide consistent, personalized care.   Referrals If a referral was made during today's visit and you haven't received any updates within two weeks, please contact the referred provider directly to check on the status.  Recommended Screenings:  Health Maintenance  Topic Date Due   Hepatitis C Screening  Never done   Pneumococcal Vaccine for age over 43 (2 of 2 - PCV) 06/22/2019   Colon Cancer Screening  10/27/2020   Flu Shot  08/26/2023   COVID-19 Vaccine (5 - 2025-26 season) 09/26/2023   Medicare Annual Wellness Visit  02/07/2024   DTaP/Tdap/Td vaccine (2 - Td or Tdap) 08/15/2031   Zoster (Shingles) Vaccine  Completed   Meningitis B Vaccine  Aged Out       02/14/2024    3:16 PM  Advanced Directives  Does Patient Have a Medical Advance Directive? No  Would patient like information on creating a medical advance directive? No - Patient declined    Vision: Annual vision screenings are recommended for early detection of glaucoma, cataracts, and diabetic retinopathy. These exams can also reveal signs of chronic conditions such as diabetes and high blood pressure.  Dental: Annual dental screenings help detect early signs of oral cancer, gum disease, and other conditions linked to overall health, including heart disease and diabetes.  Please see the attached documents for additional preventive care recommendations.

## 2024-02-14 NOTE — Progress Notes (Signed)
 "  Chief Complaint  Patient presents with   Medicare Wellness     Subjective:   Norman Rodriguez is a 69 y.o. male who presents for a Medicare Annual Wellness Visit.  Visit info / Clinical Intake: Medicare Wellness Visit Type:: Subsequent Annual Wellness Visit Persons participating in visit and providing information:: patient Medicare Wellness Visit Mode:: Telephone If telephone:: video declined Since this visit was completed virtually, some vitals may be partially provided or unavailable. Missing vitals are due to the limitations of the virtual format.: Unable to obtain vitals - no equipment If Telephone or Video please confirm:: I connected with patient using audio/video enable telemedicine. I verified patient identity with two identifiers, discussed telehealth limitations, and patient agreed to proceed. Patient Location:: home Provider Location:: office Interpreter Needed?: No Pre-visit prep was completed: yes AWV questionnaire completed by patient prior to visit?: no Living arrangements:: (!) lives alone Patient's Overall Health Status Rating: (!) fair Typical amount of pain: some Does pain affect daily life?: no Are you currently prescribed opioids?: no  Dietary Habits and Nutritional Risks How many meals a day?: 2 Eats fruit and vegetables daily?: (!) no Most meals are obtained by: eating out In the last 2 weeks, have you had any of the following?: none Diabetic:: no  Functional Status Activities of Daily Living (to include ambulation/medication): Independent Ambulation: Independent with device- listed below Home Assistive Devices/Equipment: Cane Medication Administration: Independent Home Management (perform basic housework or laundry): Independent Manage your own finances?: yes Primary transportation is: facility / other Concerns about vision?: no *vision screening is required for WTM* Concerns about hearing?: no  Fall Screening Falls in the past year?: 1 (lost  balance) Number of falls in past year: 0 Was there an injury with Fall?: 0 Fall Risk Category Calculator: 1 Patient Fall Risk Level: Low Fall Risk  Fall Risk Patient at Risk for Falls Due to: Medication side effect; Impaired balance/gait; Impaired mobility Fall risk Follow up: Falls prevention discussed; Education provided; Falls evaluation completed  Home and Transportation Safety: All rugs have non-skid backing?: N/A, no rugs All stairs or steps have railings?: yes Grab bars in the bathtub or shower?: (!) no Have non-skid surface in bathtub or shower?: (!) no Good home lighting?: yes Regular seat belt use?: yes Hospital stays in the last year:: (!) yes How many hospital stays:: 1 Reason: heart  Cognitive Assessment Difficulty concentrating, remembering, or making decisions? : no Will 6CIT or Mini Cog be Completed: yes What year is it?: 0 points What month is it?: 0 points Give patient an address phrase to remember (5 components): [address] [place] About what time is it?: 3 points Count backwards from 20 to 1: 0 points Say the months of the year in reverse: 0 points Repeat the address phrase from earlier: 0 points 6 CIT Score: 3 points  Advance Directives (For Healthcare) Does Patient Have a Medical Advance Directive?: No Would patient like information on creating a medical advance directive?: No - Patient declined  Reviewed/Updated  Reviewed/Updated: Reviewed All (Medical, Surgical, Family, Medications, Allergies, Care Teams, Patient Goals)    Allergies (verified) Apple, Apple juice, Soybean oil, Flaxseed (linseed), Lisinopril, Other, Bean pod extract, Carrot [daucus carota], Grass extracts [gramineae pollens], Peach flavoring agent (non-screening), and Tree extract   Current Medications (verified) Outpatient Encounter Medications as of 02/14/2024  Medication Sig   allopurinol  (ZYLOPRIM ) 100 MG tablet TAKE TWO TABLETS (200 MG TOTAL) BY MOUTH DAILY   amiodarone   (PACERONE ) 200 MG tablet  Take 1 tablet (200 mg total) by mouth daily.   atorvastatin  (LIPITOR ) 40 MG tablet TAKE ONE TABLET BY MOUTH DAILY   carvedilol  (COREG ) 6.25 MG tablet Take 1 tablet (6.25 mg total) by mouth 2 (two) times daily.   ciclopirox  (PENLAC ) 8 % solution Apply topically at bedtime. Apply over nail and surrounding skin. Apply daily over previous coat. After seven (7) days, may remove with alcohol and continue cycle.   Coenzyme Q10 (CO Q-10) 200 MG CAPS Take 200 mg by mouth daily.   Colchicine  0.6 MG CAPS Day 1: Take 1 caps. If still having swelling, may take 1 cap the following day.   digoxin  (LANOXIN ) 0.125 MG tablet TAKE ONE TABLET BY MOUTH DAILY   fexofenadine (ALLEGRA) 180 MG tablet Take 180 mg by mouth daily.   furosemide  (LASIX ) 40 MG tablet Take 1 tablet (40 mg total) by mouth 2 (two) times daily.   gabapentin  (NEURONTIN ) 300 MG capsule Take 1 capsule (300 mg total) by mouth at bedtime as needed (sleep nerve pain).   Multiple Vitamin (MULTIVITAMIN) tablet Take 1 tablet by mouth daily.   nitroGLYCERIN  (NITROSTAT ) 0.4 MG SL tablet Place 1 tablet (0.4 mg total) under the tongue every 5 (five) minutes as needed for chest pain.   Omega-3 Fatty Acids (OMEGA 3 PO) Take 1 capsule by mouth daily.   rivaroxaban  (XARELTO ) 20 MG TABS tablet TAKE ONE TABLET BY MOUTH DAILY WITH SUPPER   sacubitril -valsartan  (ENTRESTO ) 24-26 MG Take 1 tablet by mouth 2 (two) times daily.   vitamin C  (ASCORBIC ACID ) 500 MG tablet Take 500 mg by mouth daily.   VITAMIN D, CHOLECALCIFEROL , PO Take 5,000 Units by mouth daily.   acetaminophen  (GNP PAIN RELIEF EX-STRENGTH) 500 MG tablet TAKE 1-2 TABLETS BY MOUTH THREE (THREE) TIMES DAILY AS NEEDED. (Patient not taking: Reported on 02/14/2024)   No facility-administered encounter medications on file as of 02/14/2024.    History: Past Medical History:  Diagnosis Date   ACS (acute coronary syndrome) (HCC)    Arthritis    Atrial fibrillation (HCC)    a. Dx 2010.  b. Initiated on Tikosyn  03/2013.   BRBPR (bright red blood per rectum) 10/22/2017   CAD (coronary artery disease)    a. BMS 2.56mm 23 to LAD. CTO of RCA unsuccessful, good left to right collaterals - 2010.   Chronic anticoagulation    Chronic systolic CHF (congestive heart failure) (HCC)    a. Both ischemic and tachy induced. b. Echo 04/2013: EF 30-35%, no RWMA, normal RV, mod dilated LA (before restoration of NSR).   COPD (chronic obstructive pulmonary disease) (HCC) 10/22/2017   Dysrhythmia    hx of atrial fibrilation   HTN (hypertension)    Hyperlipidemia    Melena    NSTEMI (non-ST elevated myocardial infarction) (HCC) 01/05/2021   NSVT (nonsustained ventricular tachycardia) (HCC)    Obesity    Polyp of cecum    Polyp of descending colon    Polyp of rectum    Polyp of transverse colon    Shortness of breath    Past Surgical History:  Procedure Laterality Date   CARDIAC DEFIBRILLATOR PLACEMENT  2024   CARDIOVERSION N/A 04/27/2013   Procedure: BEDSIDE CARDIOVERSION;  Surgeon: Jerel Balding, MD;  Location: [facility] OR;  Service: Cardiovascular;  Laterality: N/A;   CARDIOVERSION N/A 05/15/2013   Procedure: CARDIOVERSION/BEDSIDE;  Surgeon: Ezra GORMAN Shuck, MD;  Location: [facility] OR;  Service: Cardiovascular;  Laterality: N/A;   CARDIOVERSION N/A 05/16/2014   Procedure: CARDIOVERSION;  Surgeon: Oneil JAYSON Parchment, MD;  Location: [facility] ENDOSCOPY;  Service: Cardiovascular;  Laterality: N/A;   CARDIOVERSION N/A 06/07/2014   Procedure: CARDIOVERSION;  Surgeon: Oneil JAYSON Parchment, MD;  Location: [facility] ENDOSCOPY;  Service: Cardiovascular;  Laterality: N/A;   COLONOSCOPY WITH PROPOFOL  N/A 10/27/2017   Procedure: COLONOSCOPY WITH PROPOFOL ;  Surgeon: Shila Gustav GAILS, MD;  Location: [facility] ENDOSCOPY;  Service: Endoscopy;  Laterality: N/A;   CORONARY ANGIOPLASTY WITH STENT PLACEMENT  01/26/2008   BMS 2.43mm 23 to LAD. CTO of RCA unsuccessful, good left to right collaterals   ESOPHAGOGASTRODUODENOSCOPY (EGD) WITH PROPOFOL   N/A 10/24/2017   Procedure: ESOPHAGOGASTRODUODENOSCOPY (EGD) WITH PROPOFOL ;  Surgeon: Shila Gustav GAILS, MD;  Location: [facility] ENDOSCOPY;  Service: Endoscopy;  Laterality: N/A;   FOOT SURGERY Right    ICD IMPLANT N/A 01/06/2021   Procedure: ICD IMPLANT;  Surgeon: Inocencio Soyla Lunger, MD;  Location: [facility] INVASIVE CV LAB;  Service: Cardiovascular;  Laterality: N/A;   LEFT HEART CATH AND CORONARY ANGIOGRAPHY N/A 01/05/2021   Procedure: LEFT HEART CATH AND CORONARY ANGIOGRAPHY;  Surgeon: Court Dorn PARAS, MD;  Location: [facility] INVASIVE CV LAB;  Service: Cardiovascular;  Laterality: N/A;   POLYPECTOMY  10/27/2017   Procedure: POLYPECTOMY;  Surgeon: Shila Gustav GAILS, MD;  Location: [facility] ENDOSCOPY;  Service: Endoscopy;;   Family History  Problem Relation Age of Onset   Other Father        Car accident   Coronary artery disease Neg Hx    Social History   Occupational History   Occupation: Retired/ disabled  Tobacco Use   Smoking status: Former    Types: Cigars    Passive exposure: Past   Smokeless tobacco: Never   Tobacco comments:    1 cigar per month states he stopped smoking  Vaping Use   Vaping status: Never Used  Substance and Sexual Activity   Alcohol use: Yes    Comment: occ   Drug use: No    Comment: 1 per month...pt states he stopped smoking   Sexual activity: Not Currently   Tobacco Counseling Counseling given: Not Answered Tobacco comments: 1 cigar per month states he stopped smoking  SDOH Screenings   Food Insecurity: No Food Insecurity (02/14/2024)  Housing: Unknown (02/14/2024)  Transportation Needs: No Transportation Needs (02/14/2024)  Utilities: Not At Risk (08/01/2023)  Alcohol Screen: Low Risk (02/14/2024)  Depression (PHQ2-9): Low Risk (02/14/2024)  Financial Resource Strain: Low Risk (02/14/2024)  Physical Activity: Inactive (02/14/2024)  Social Connections: Moderately Isolated (02/14/2024)  Stress: No Stress Concern Present (02/14/2024)  Tobacco Use: Medium Risk  (02/14/2024)  Health Literacy: Adequate Health Literacy (02/14/2024)   See flowsheets for full screening details  Depression Screen PHQ 2 & 9 Depression Scale- Over the past 2 weeks, how often have you been bothered by any of the following problems? Little interest or pleasure in doing things: 0 Feeling down, depressed, or hopeless (PHQ Adolescent also includes...irritable): 0 PHQ-2 Total Score: 0 Trouble falling or staying asleep, or sleeping too much: 0 Feeling tired or having little energy: 0 Poor appetite or overeating (PHQ Adolescent also includes...weight loss): 0 Feeling bad about yourself - or that you are a failure or have let yourself or your family down: 0 Trouble concentrating on things, such as reading the newspaper or watching television (PHQ Adolescent also includes...like school work): 0 Moving or speaking so slowly that other people could have noticed. Or the opposite - being so fidgety or restless that you have been moving around a lot more than usual: 0  Thoughts that you would be better off dead, or of hurting yourself in some way: 0 PHQ-9 Total Score: 0 If you checked off any problems, how difficult have these problems made it for you to do your work, take care of things at home, or get along with other people?: Not difficult at all  Depression Treatment Depression Interventions/Treatment : [id number] Score <4 Follow-up Not Indicated     Goals Addressed               This Visit's Progress     Wants to get energy back to normal (pt-stated)               Objective:    Today's Vitals   There is no height or weight on file to calculate BMI.  Hearing/Vision screen Vision Screening - Comments:: No regular eye exams Immunizations and Health Maintenance Health Maintenance  Topic Date Due   Hepatitis C Screening  Never done   Pneumococcal Vaccine: 50+ Years (2 of 2 - PCV) 06/22/2019   Colonoscopy  10/27/2020   Influenza Vaccine  08/26/2023   COVID-19 Vaccine  (5 - 2025-26 season) 09/26/2023   Medicare Annual Wellness (AWV)  02/13/2025   DTaP/Tdap/Td (2 - Td or Tdap) 08/15/2031   Zoster Vaccines- Shingrix  Completed   Meningococcal B Vaccine  Aged Out        Assessment/Plan:  This is a routine wellness examination for [facility].  Patient Care Team: [name], MD as PCP - General (Family Medicine) Jeffrie Oneil BROCKS, MD as PCP - Cardiology (Cardiology) Inocencio Soyla Lunger, MD as PCP - Electrophysiology (Cardiology) Jeannetta Lonni ORN, MD as Consulting Physician (Rheumatology)  I have personally reviewed and noted the following in the patients chart:   Medical and social history Use of alcohol, tobacco or illicit drugs  Current medications and supplements including opioid prescriptions. Functional ability and status Nutritional status Physical activity Advanced directives List of other physicians Hospitalizations, surgeries, and ER visits in previous 12 months Vitals Screenings to include cognitive, depression, and falls Referrals and appointments  No orders of the defined types were placed in this encounter.  In addition, I have reviewed and discussed with patient certain preventive protocols, quality metrics, and best practice recommendations. A written personalized care plan for preventive services as well as general preventive health recommendations were provided to patient.   Ardella FORBES Dawn, LPN   [id number]/79/[id number]   Return in 1 year (on 02/13/2025).  After Visit Summary: (MyChart) Due to this being a telephonic visit, the after visit summary with patients personalized plan was offered to patient via MyChart   Nurse Notes: HM Addressed: Vaccines Due: pneumonia, flu and covid States waiting to get clearance from Dr. Inocencio for colonoscopy. Did not want to make an appointment at this time.  "

## 2024-04-16 ENCOUNTER — Ambulatory Visit: Admitting: Internal Medicine
